# Patient Record
Sex: Female | Born: 1950 | Race: White | Hispanic: No | Marital: Married | Smoking: Former smoker
Health system: Southern US, Community
[De-identification: ages and names within clinical notes are randomized; demographics above are authoritative.]

## PROBLEM LIST (undated history)

## (undated) DIAGNOSIS — I519 Heart disease, unspecified: Secondary | ICD-10-CM

## (undated) DIAGNOSIS — M722 Plantar fascial fibromatosis: Secondary | ICD-10-CM

## (undated) DIAGNOSIS — N814 Uterovaginal prolapse, unspecified: Secondary | ICD-10-CM

## (undated) DIAGNOSIS — M8580 Other specified disorders of bone density and structure, unspecified site: Secondary | ICD-10-CM

## (undated) DIAGNOSIS — I509 Heart failure, unspecified: Secondary | ICD-10-CM

## (undated) DIAGNOSIS — G4700 Insomnia, unspecified: Secondary | ICD-10-CM

## (undated) DIAGNOSIS — R112 Nausea with vomiting, unspecified: Secondary | ICD-10-CM

## (undated) DIAGNOSIS — C801 Malignant (primary) neoplasm, unspecified: Secondary | ICD-10-CM

## (undated) DIAGNOSIS — Z9889 Other specified postprocedural states: Secondary | ICD-10-CM

## (undated) DIAGNOSIS — G4730 Sleep apnea, unspecified: Secondary | ICD-10-CM

## (undated) DIAGNOSIS — I10 Essential (primary) hypertension: Secondary | ICD-10-CM

## (undated) DIAGNOSIS — E785 Hyperlipidemia, unspecified: Secondary | ICD-10-CM

## (undated) DIAGNOSIS — Z902 Acquired absence of lung [part of]: Secondary | ICD-10-CM

## (undated) DIAGNOSIS — D1430 Benign neoplasm of unspecified bronchus and lung: Secondary | ICD-10-CM

## (undated) DIAGNOSIS — Z923 Personal history of irradiation: Secondary | ICD-10-CM

## (undated) DIAGNOSIS — Z860101 Personal history of adenomatous and serrated colon polyps: Secondary | ICD-10-CM

## (undated) DIAGNOSIS — R0600 Dyspnea, unspecified: Secondary | ICD-10-CM

## (undated) DIAGNOSIS — R32 Unspecified urinary incontinence: Secondary | ICD-10-CM

## (undated) DIAGNOSIS — K219 Gastro-esophageal reflux disease without esophagitis: Secondary | ICD-10-CM

## (undated) DIAGNOSIS — C50919 Malignant neoplasm of unspecified site of unspecified female breast: Secondary | ICD-10-CM

## (undated) HISTORY — PX: LUNG SURGERY: SHX703

## (undated) HISTORY — DX: Gastro-esophageal reflux disease without esophagitis: K21.9

## (undated) HISTORY — DX: Hyperlipidemia, unspecified: E78.5

## (undated) HISTORY — DX: Other specified disorders of bone density and structure, unspecified site: M85.80

## (undated) HISTORY — PX: CHOLECYSTECTOMY: SHX55

## (undated) HISTORY — PX: DILATION AND CURETTAGE OF UTERUS: SHX78

## (undated) HISTORY — DX: Plantar fascial fibromatosis: M72.2

## (undated) HISTORY — PX: EYE SURGERY: SHX253

## (undated) HISTORY — DX: Insomnia, unspecified: G47.00

## (undated) HISTORY — PX: CATARACT EXTRACTION, BILATERAL: SHX1313

## (undated) HISTORY — DX: Benign neoplasm of unspecified bronchus and lung: D14.30

## (undated) HISTORY — PX: MASTECTOMY: SHX3

## (undated) HISTORY — PX: LOBECTOMY: SHX5089

## (undated) HISTORY — PX: OTHER SURGICAL HISTORY: SHX169

## (undated) HISTORY — DX: Acquired absence of lung (part of): Z90.2

---

## 2003-04-10 HISTORY — PX: UPPER GI ENDOSCOPY: SHX6162

## 2004-07-03 ENCOUNTER — Ambulatory Visit: Payer: Self-pay | Admitting: Obstetrics and Gynecology

## 2004-09-22 ENCOUNTER — Ambulatory Visit: Payer: Self-pay | Admitting: Unknown Physician Specialty

## 2005-07-05 ENCOUNTER — Ambulatory Visit: Payer: Self-pay | Admitting: Obstetrics and Gynecology

## 2006-06-07 ENCOUNTER — Ambulatory Visit: Payer: Self-pay | Admitting: Otolaryngology

## 2006-07-10 ENCOUNTER — Ambulatory Visit: Payer: Self-pay | Admitting: Obstetrics and Gynecology

## 2007-07-15 ENCOUNTER — Ambulatory Visit: Payer: Self-pay | Admitting: Obstetrics and Gynecology

## 2007-10-09 ENCOUNTER — Ambulatory Visit: Payer: Self-pay

## 2007-10-28 ENCOUNTER — Ambulatory Visit: Payer: Self-pay

## 2008-04-09 HISTORY — PX: FOOT SURGERY: SHX648

## 2008-09-10 ENCOUNTER — Ambulatory Visit: Payer: Self-pay | Admitting: Obstetrics and Gynecology

## 2009-11-03 HISTORY — PX: CYSTECTOMY: SUR359

## 2009-12-05 ENCOUNTER — Ambulatory Visit: Payer: Self-pay | Admitting: Obstetrics and Gynecology

## 2011-01-17 ENCOUNTER — Ambulatory Visit: Payer: Self-pay | Admitting: Obstetrics and Gynecology

## 2011-02-23 ENCOUNTER — Ambulatory Visit: Payer: Self-pay

## 2011-09-28 DIAGNOSIS — I519 Heart disease, unspecified: Secondary | ICD-10-CM | POA: Insufficient documentation

## 2011-11-11 ENCOUNTER — Emergency Department: Payer: Self-pay | Admitting: Emergency Medicine

## 2011-11-11 LAB — BASIC METABOLIC PANEL
Anion Gap: 8 (ref 7–16)
Calcium, Total: 9.5 mg/dL (ref 8.5–10.1)
Co2: 28 mmol/L (ref 21–32)
EGFR (African American): >60
Glucose: 119 mg/dL — ABNORMAL HIGH (ref 65–99)
Osmolality: 283 (ref 275–301)
Potassium: 3.8 mmol/L (ref 3.5–5.1)

## 2011-11-11 LAB — CBC WITH DIFFERENTIAL/PLATELET
Eosinophil %: 1.7 %
HCT: 36.5 % (ref 35.0–47.0)
Lymphocyte #: 0.4 10*3/uL — ABNORMAL LOW (ref 1.0–3.6)
MCH: 29.8 pg (ref 26.0–34.0)
MCV: 87 fL (ref 80–100)
Monocyte #: 0.7 x10 3/mm (ref 0.2–0.9)
Monocyte %: 5.6 %
Neutrophil #: 11.4 10*3/uL — ABNORMAL HIGH (ref 1.4–6.5)
Platelet: 216 10*3/uL (ref 150–440)
RDW: 13.6 % (ref 11.5–14.5)

## 2012-01-22 ENCOUNTER — Ambulatory Visit: Payer: Self-pay | Admitting: Obstetrics and Gynecology

## 2012-03-04 ENCOUNTER — Ambulatory Visit: Payer: Self-pay

## 2013-05-11 ENCOUNTER — Ambulatory Visit: Payer: Self-pay

## 2013-07-18 DIAGNOSIS — G4733 Obstructive sleep apnea (adult) (pediatric): Secondary | ICD-10-CM | POA: Insufficient documentation

## 2013-07-18 DIAGNOSIS — E669 Obesity, unspecified: Secondary | ICD-10-CM | POA: Insufficient documentation

## 2013-07-18 DIAGNOSIS — I10 Essential (primary) hypertension: Secondary | ICD-10-CM | POA: Insufficient documentation

## 2013-07-18 DIAGNOSIS — Z9889 Other specified postprocedural states: Secondary | ICD-10-CM | POA: Insufficient documentation

## 2013-10-19 ENCOUNTER — Ambulatory Visit: Payer: Self-pay | Admitting: Unknown Physician Specialty

## 2013-10-19 LAB — HM COLONOSCOPY

## 2013-10-20 LAB — PATHOLOGY REPORT

## 2013-11-17 DIAGNOSIS — I10 Essential (primary) hypertension: Secondary | ICD-10-CM | POA: Diagnosis not present

## 2013-11-17 DIAGNOSIS — I8310 Varicose veins of unspecified lower extremity with inflammation: Secondary | ICD-10-CM | POA: Diagnosis not present

## 2013-11-17 DIAGNOSIS — I509 Heart failure, unspecified: Secondary | ICD-10-CM | POA: Diagnosis not present

## 2013-12-07 DIAGNOSIS — H524 Presbyopia: Secondary | ICD-10-CM | POA: Diagnosis not present

## 2013-12-07 DIAGNOSIS — H52229 Regular astigmatism, unspecified eye: Secondary | ICD-10-CM | POA: Diagnosis not present

## 2013-12-07 DIAGNOSIS — H02839 Dermatochalasis of unspecified eye, unspecified eyelid: Secondary | ICD-10-CM | POA: Diagnosis not present

## 2013-12-07 DIAGNOSIS — H5200 Hypermetropia, unspecified eye: Secondary | ICD-10-CM | POA: Diagnosis not present

## 2013-12-07 DIAGNOSIS — H2510 Age-related nuclear cataract, unspecified eye: Secondary | ICD-10-CM | POA: Diagnosis not present

## 2013-12-07 DIAGNOSIS — H43819 Vitreous degeneration, unspecified eye: Secondary | ICD-10-CM | POA: Diagnosis not present

## 2013-12-08 ENCOUNTER — Ambulatory Visit: Payer: Self-pay | Admitting: Emergency Medicine

## 2013-12-08 DIAGNOSIS — I10 Essential (primary) hypertension: Secondary | ICD-10-CM | POA: Diagnosis not present

## 2013-12-08 DIAGNOSIS — K219 Gastro-esophageal reflux disease without esophagitis: Secondary | ICD-10-CM | POA: Diagnosis not present

## 2013-12-08 DIAGNOSIS — J069 Acute upper respiratory infection, unspecified: Secondary | ICD-10-CM | POA: Diagnosis not present

## 2013-12-08 DIAGNOSIS — Z9089 Acquired absence of other organs: Secondary | ICD-10-CM | POA: Diagnosis not present

## 2013-12-08 DIAGNOSIS — J40 Bronchitis, not specified as acute or chronic: Secondary | ICD-10-CM | POA: Diagnosis not present

## 2013-12-08 DIAGNOSIS — Z87891 Personal history of nicotine dependence: Secondary | ICD-10-CM | POA: Diagnosis not present

## 2013-12-08 DIAGNOSIS — Z7982 Long term (current) use of aspirin: Secondary | ICD-10-CM | POA: Diagnosis not present

## 2013-12-08 DIAGNOSIS — Z79899 Other long term (current) drug therapy: Secondary | ICD-10-CM | POA: Diagnosis not present

## 2013-12-22 DIAGNOSIS — I6529 Occlusion and stenosis of unspecified carotid artery: Secondary | ICD-10-CM | POA: Diagnosis not present

## 2013-12-22 DIAGNOSIS — M79609 Pain in unspecified limb: Secondary | ICD-10-CM | POA: Diagnosis not present

## 2014-02-18 DIAGNOSIS — Z124 Encounter for screening for malignant neoplasm of cervix: Secondary | ICD-10-CM | POA: Diagnosis not present

## 2014-02-18 DIAGNOSIS — Z01419 Encounter for gynecological examination (general) (routine) without abnormal findings: Secondary | ICD-10-CM | POA: Diagnosis not present

## 2014-02-22 DIAGNOSIS — M810 Age-related osteoporosis without current pathological fracture: Secondary | ICD-10-CM | POA: Diagnosis not present

## 2014-02-22 DIAGNOSIS — M899 Disorder of bone, unspecified: Secondary | ICD-10-CM | POA: Diagnosis not present

## 2014-03-08 DIAGNOSIS — M8588 Other specified disorders of bone density and structure, other site: Secondary | ICD-10-CM | POA: Insufficient documentation

## 2014-03-08 DIAGNOSIS — M8580 Other specified disorders of bone density and structure, unspecified site: Secondary | ICD-10-CM | POA: Insufficient documentation

## 2014-03-22 ENCOUNTER — Ambulatory Visit: Payer: Self-pay | Admitting: Obstetrics and Gynecology

## 2014-03-22 DIAGNOSIS — Z1231 Encounter for screening mammogram for malignant neoplasm of breast: Secondary | ICD-10-CM | POA: Diagnosis not present

## 2014-03-22 LAB — HM MAMMOGRAPHY

## 2014-03-23 DIAGNOSIS — I519 Heart disease, unspecified: Secondary | ICD-10-CM | POA: Diagnosis not present

## 2014-03-23 DIAGNOSIS — I10 Essential (primary) hypertension: Secondary | ICD-10-CM | POA: Diagnosis not present

## 2014-05-19 DIAGNOSIS — Z0000 Encounter for general adult medical examination without abnormal findings: Secondary | ICD-10-CM | POA: Diagnosis not present

## 2014-05-19 DIAGNOSIS — K219 Gastro-esophageal reflux disease without esophagitis: Secondary | ICD-10-CM | POA: Diagnosis not present

## 2014-05-19 DIAGNOSIS — I10 Essential (primary) hypertension: Secondary | ICD-10-CM | POA: Diagnosis not present

## 2014-05-19 DIAGNOSIS — E669 Obesity, unspecified: Secondary | ICD-10-CM | POA: Diagnosis not present

## 2014-05-21 ENCOUNTER — Ambulatory Visit: Payer: Self-pay

## 2014-05-21 DIAGNOSIS — R918 Other nonspecific abnormal finding of lung field: Secondary | ICD-10-CM | POA: Diagnosis not present

## 2014-06-10 DIAGNOSIS — H16141 Punctate keratitis, right eye: Secondary | ICD-10-CM | POA: Diagnosis not present

## 2014-06-10 DIAGNOSIS — H02831 Dermatochalasis of right upper eyelid: Secondary | ICD-10-CM | POA: Diagnosis not present

## 2014-06-10 DIAGNOSIS — H02834 Dermatochalasis of left upper eyelid: Secondary | ICD-10-CM | POA: Diagnosis not present

## 2014-06-10 DIAGNOSIS — H43813 Vitreous degeneration, bilateral: Secondary | ICD-10-CM | POA: Diagnosis not present

## 2014-06-10 DIAGNOSIS — H2513 Age-related nuclear cataract, bilateral: Secondary | ICD-10-CM | POA: Diagnosis not present

## 2014-08-06 ENCOUNTER — Ambulatory Visit
Admit: 2014-08-06 | Disposition: A | Discharge status: Home / Self Care | Payer: Self-pay | Attending: Family Medicine | Admitting: Family Medicine

## 2014-08-06 DIAGNOSIS — J329 Chronic sinusitis, unspecified: Secondary | ICD-10-CM | POA: Diagnosis not present

## 2014-08-06 LAB — RAPID STREP-A WITH REFLX: Micro Text Report: NEGATIVE

## 2014-08-09 LAB — BETA STREP CULTURE(ARMC)

## 2014-09-22 DIAGNOSIS — R42 Dizziness and giddiness: Secondary | ICD-10-CM | POA: Diagnosis not present

## 2014-09-22 DIAGNOSIS — I10 Essential (primary) hypertension: Secondary | ICD-10-CM | POA: Diagnosis not present

## 2014-09-22 DIAGNOSIS — I519 Heart disease, unspecified: Secondary | ICD-10-CM | POA: Diagnosis not present

## 2014-09-22 DIAGNOSIS — Z86010 Personal history of colonic polyps: Secondary | ICD-10-CM | POA: Diagnosis not present

## 2014-10-12 ENCOUNTER — Ambulatory Visit
Admit: 2014-10-12 | Discharge: 2014-10-12 | Disposition: A | Discharge status: Home / Self Care | Payer: Medicare Other | Attending: Family Medicine | Admitting: Family Medicine

## 2014-10-12 ENCOUNTER — Ambulatory Visit: Payer: Medicare Other

## 2014-10-12 ENCOUNTER — Encounter: Payer: Self-pay | Admitting: Emergency Medicine

## 2014-10-12 ENCOUNTER — Ambulatory Visit
Admission: EM | Admit: 2014-10-12 | Discharge: 2014-10-12 | Disposition: A | Discharge status: Home / Self Care | Payer: Medicare Other | Attending: Family Medicine | Admitting: Family Medicine

## 2014-10-12 DIAGNOSIS — M79662 Pain in left lower leg: Secondary | ICD-10-CM | POA: Diagnosis not present

## 2014-10-12 DIAGNOSIS — M79605 Pain in left leg: Secondary | ICD-10-CM | POA: Diagnosis not present

## 2014-10-12 DIAGNOSIS — I509 Heart failure, unspecified: Secondary | ICD-10-CM | POA: Insufficient documentation

## 2014-10-12 DIAGNOSIS — M7989 Other specified soft tissue disorders: Secondary | ICD-10-CM | POA: Diagnosis not present

## 2014-10-12 DIAGNOSIS — M62831 Muscle spasm of calf: Secondary | ICD-10-CM | POA: Diagnosis present

## 2014-10-12 DIAGNOSIS — Z7982 Long term (current) use of aspirin: Secondary | ICD-10-CM | POA: Insufficient documentation

## 2014-10-12 DIAGNOSIS — I10 Essential (primary) hypertension: Secondary | ICD-10-CM | POA: Insufficient documentation

## 2014-10-12 HISTORY — DX: Heart failure, unspecified: I50.9

## 2014-10-12 HISTORY — DX: Essential (primary) hypertension: I10

## 2014-10-12 NOTE — ED Notes (Signed)
Patient c/o pain and muscle cramps in her left calf since Friday.  Patient denies injury.

## 2014-10-12 NOTE — ED Provider Notes (Signed)
CSN: [id number]     Arrival date & time 10/12/14  1018 History   First MD Initiated Contact with Patient 10/12/14 1231     Chief Complaint  Patient presents with  . Spasms    left calf   (Consider location/radiation/quality/duration/timing/severity/associated sxs/prior Treatment) HPI Comments: 64 yo female with a  4 days h/o sudden onset of calf pain and intermittent swelling of calf and foot.  Patient denies any injuries, falls, trauma, redness, rash. States she thought she was having muscle cramps but pain has been consistent and not improving. Denies any chest pains or shortness of breath. Has not tried any anything for relief.   The history is provided by the patient.    Past Medical History  Diagnosis Date  . Hypertension   . CHF (congestive heart failure)    Past Surgical History  Procedure Laterality Date  . Abdominal hysterectomy    . Cholecystectomy    . Lung surgery Right    History reviewed. No pertinent family history. History  Substance Use Topics  . Smoking status: Never Smoker   . Smokeless tobacco: Never Used  . Alcohol Use: No   OB History    No data available     Review of Systems  Allergies  Contrast media and Hctz  Home Medications   Prior to Admission medications   Medication Sig Start Date End Date Taking? Authorizing Provider  amLODipine (NORVASC) 5 MG tablet Take 5 mg by mouth daily.   Yes Historical Provider, MD  aspirin 81 MG tablet Take 81 mg by mouth daily.   Yes Historical Provider, MD  calcium-vitamin D (OSCAL WITH D) 500-200 MG-UNIT per tablet Take 1 tablet by mouth 2 (two) times daily.   Yes Historical Provider, MD  metoprolol succinate (TOPROL-XL) 25 MG 24 hr tablet Take 25 mg by mouth daily.   Yes Historical Provider, MD  Omega 3-6-9 Fatty Acids (OMEGA-3 & OMEGA-6 FISH OIL PO) Take 1 capsule by mouth 2 (two) times daily.   Yes Historical Provider, MD  pantoprazole (PROTONIX) 40 MG tablet Take 40 mg by mouth 2 (two) times daily.   Yes  Historical Provider, MD  [facility] Wort 1000 MG CAPS Take 1 capsule by mouth 2 (two) times daily.   Yes Historical Provider, MD  valsartan (DIOVAN) 160 MG tablet Take 160 mg by mouth daily.   Yes Historical Provider, MD  vitamin B-12 (CYANOCOBALAMIN) 1000 MCG tablet Take 1,000 mcg by mouth daily.   Yes Historical Provider, MD   BP 131/70 mmHg  Pulse 60  Temp(Src) 96.5 F (35.8 C) (Tympanic)  Resp 16  Ht 5\' 7"  (1.702 m)  Wt 210 lb (95.255 kg)  BMI 32.88 kg/m2  SpO2 99% Physical Exam  Constitutional: She appears well-developed and well-nourished. No distress.  Cardiovascular: Normal rate, regular rhythm and normal heart sounds.   Pulmonary/Chest: Effort normal and breath sounds normal. No respiratory distress.  Musculoskeletal:  Mild left calf edema and tenderness to palpation; skin intact; no erythema; extremity neurovascularly intact  Skin: No rash noted. She is not diaphoretic. No erythema.  Nursing note and vitals reviewed.   ED Course  Procedures (including critical care time) Labs Review Labs Reviewed - No data to display  Imaging Review No results found.   MDM   1. Calf pain, left    Plan: 1. Possible etiologies reviewed with patient (muscular strain vs possible DVT) 2. Recommend lower extremity venous doppler to rule out DVT; will be done today; further recommendations pending  lower extremity doppler US   Oleda Borski, MD 10/12/14 1400

## 2014-12-28 DIAGNOSIS — I6523 Occlusion and stenosis of bilateral carotid arteries: Secondary | ICD-10-CM | POA: Diagnosis not present

## 2014-12-28 DIAGNOSIS — E785 Hyperlipidemia, unspecified: Secondary | ICD-10-CM | POA: Diagnosis not present

## 2014-12-28 DIAGNOSIS — I6529 Occlusion and stenosis of unspecified carotid artery: Secondary | ICD-10-CM | POA: Diagnosis not present

## 2014-12-28 DIAGNOSIS — I10 Essential (primary) hypertension: Secondary | ICD-10-CM | POA: Diagnosis not present

## 2014-12-28 DIAGNOSIS — I8310 Varicose veins of unspecified lower extremity with inflammation: Secondary | ICD-10-CM | POA: Diagnosis not present

## 2015-01-19 DIAGNOSIS — M722 Plantar fascial fibromatosis: Secondary | ICD-10-CM

## 2015-01-19 DIAGNOSIS — Z86010 Personal history of colonic polyps: Secondary | ICD-10-CM | POA: Insufficient documentation

## 2015-01-19 DIAGNOSIS — E785 Hyperlipidemia, unspecified: Secondary | ICD-10-CM | POA: Insufficient documentation

## 2015-01-19 DIAGNOSIS — G4700 Insomnia, unspecified: Secondary | ICD-10-CM | POA: Insufficient documentation

## 2015-01-19 DIAGNOSIS — E8881 Metabolic syndrome: Secondary | ICD-10-CM | POA: Insufficient documentation

## 2015-01-19 DIAGNOSIS — E669 Obesity, unspecified: Secondary | ICD-10-CM | POA: Insufficient documentation

## 2015-01-19 DIAGNOSIS — K219 Gastro-esophageal reflux disease without esophagitis: Secondary | ICD-10-CM | POA: Insufficient documentation

## 2015-01-19 DIAGNOSIS — Z860101 Personal history of adenomatous and serrated colon polyps: Secondary | ICD-10-CM | POA: Insufficient documentation

## 2015-01-19 DIAGNOSIS — D1430 Benign neoplasm of unspecified bronchus and lung: Secondary | ICD-10-CM | POA: Insufficient documentation

## 2015-01-21 ENCOUNTER — Ambulatory Visit (INDEPENDENT_AMBULATORY_CARE_PROVIDER_SITE_OTHER): Payer: Medicare Other | Admitting: Unknown Physician Specialty

## 2015-01-21 ENCOUNTER — Encounter: Payer: Self-pay | Admitting: Unknown Physician Specialty

## 2015-01-21 VITALS — BP 133/85 | HR 74 | Temp 98.2°F | Ht 66.5 in | Wt 216.8 lb

## 2015-01-21 DIAGNOSIS — Z23 Encounter for immunization: Secondary | ICD-10-CM | POA: Diagnosis not present

## 2015-01-21 DIAGNOSIS — I10 Essential (primary) hypertension: Secondary | ICD-10-CM | POA: Diagnosis not present

## 2015-01-21 DIAGNOSIS — K219 Gastro-esophageal reflux disease without esophagitis: Secondary | ICD-10-CM | POA: Diagnosis not present

## 2015-01-21 DIAGNOSIS — E785 Hyperlipidemia, unspecified: Secondary | ICD-10-CM | POA: Diagnosis not present

## 2015-01-21 MED ORDER — AMLODIPINE BESYLATE 5 MG PO TABS: 5.0000 mg | ORAL_TABLET | Freq: Every day | ORAL | Status: DC

## 2015-01-21 MED ORDER — VALSARTAN 160 MG PO TABS: 160.0000 mg | ORAL_TABLET | Freq: Every day | ORAL | Status: DC

## 2015-01-21 MED ORDER — METOPROLOL SUCCINATE ER 25 MG PO TB24: 25.0000 mg | ORAL_TABLET | Freq: Every day | ORAL | Status: DC

## 2015-01-21 MED ORDER — PANTOPRAZOLE SODIUM 40 MG PO TBEC: 40.0000 mg | DELAYED_RELEASE_TABLET | Freq: Two times a day (BID) | ORAL | Status: DC

## 2015-01-21 NOTE — Assessment & Plan Note (Signed)
Stable, continue present medications.   

## 2015-01-21 NOTE — Assessment & Plan Note (Signed)
Refusing statin

## 2015-01-21 NOTE — Progress Notes (Signed)
BP 133/85 mmHg  Pulse 74  Temp(Src) 98.2 F (36.8 C)  Ht 5' 6.5" (1.689 m)  Wt 216 lb 12.8 oz (98.34 kg)  BMI 34.47 kg/m2  SpO2 97%   Subjective:    Patient ID: Melissa Merritt, female    DOB: 1950/12/21, 64 y.o.   MRN: [id number]  HPI: Melissa Merritt is a 64 y.o. female  Chief Complaint  Patient presents with  . Medication Management    Needs refills on medications.    Hypertension This is a chronic problem. The problem is controlled. Pertinent negatives include no anxiety, blurred vision, chest pain, headaches, malaise/fatigue, neck pain, orthopnea, palpitations, peripheral edema, PND, shortness of breath or sweats. The current treatment provides significant improvement. There are no compliance problems.   Gastrophageal Reflux She reports no abdominal pain or no chest pain. This is a chronic problem. Episode frequency: constantly if runs out. The problem has been unchanged. The treatment provided significant relief.     Relevant past medical, surgical, family and social history reviewed and updated as indicated. Interim medical history since our last visit reviewed. Allergies and medications reviewed and updated.  Review of Systems  Constitutional: Negative for malaise/fatigue.  Eyes: Negative for blurred vision.  Respiratory: Negative for shortness of breath.   Cardiovascular: Negative for chest pain, palpitations, orthopnea and PND.  Gastrointestinal: Negative for abdominal pain.  Musculoskeletal: Negative for neck pain.  Neurological: Negative for headaches.    Per HPI unless specifically indicated above     Objective:    BP 133/85 mmHg  Pulse 74  Temp(Src) 98.2 F (36.8 C)  Ht 5' 6.5" (1.689 m)  Wt 216 lb 12.8 oz (98.34 kg)  BMI 34.47 kg/m2  SpO2 97%  Wt Readings from Last 3 Encounters:  01/21/15 216 lb 12.8 oz (98.34 kg)  05/19/14 216 lb (97.977 kg)  10/12/14 210 lb (95.255 kg)    Physical Exam  Constitutional: She is oriented to  person, place, and time. She appears well-developed and well-nourished. No distress.  HENT:  Head: Normocephalic and atraumatic.  Eyes: Conjunctivae and lids are normal. Right eye exhibits no discharge. Left eye exhibits no discharge. No scleral icterus.  Neck: Normal range of motion. Neck supple. No JVD present.  Cardiovascular: Normal rate and regular rhythm.   Pulmonary/Chest: Effort normal and breath sounds normal. No respiratory distress.  Abdominal: Normal appearance. There is no splenomegaly or hepatomegaly.  Musculoskeletal: Normal range of motion.  Neurological: She is alert and oriented to person, place, and time.  Skin: Skin is intact. No rash noted. No pallor.  Psychiatric: She has a normal mood and affect. Her behavior is normal. Judgment and thought content normal.      Assessment & Plan:   Problem List Items Addressed This Visit      Unprioritized   BP (high blood pressure)    Stable, continue present medications.       Relevant Medications   amLODipine (NORVASC) 5 MG tablet   metoprolol succinate (TOPROL-XL) 25 MG 24 hr tablet   valsartan (DIOVAN) 160 MG tablet   GERD (gastroesophageal reflux disease)    Stable, continue present medications.       Relevant Medications   pantoprazole (PROTONIX) 40 MG tablet   Hyperlipidemia    Refusing statin      Relevant Medications   amLODipine (NORVASC) 5 MG tablet   metoprolol succinate (TOPROL-XL) 25 MG 24 hr tablet   valsartan (DIOVAN) 160 MG tablet    Other Visit Diagnoses  Need for influenza vaccination    -  Primary    Relevant Orders    Flu Vaccine QUAD 36+ mos PF IM (Fluarix & Fluzone Quad PF) (Completed)        Follow up plan: Return for physical in February.

## 2015-02-01 DIAGNOSIS — H43813 Vitreous degeneration, bilateral: Secondary | ICD-10-CM | POA: Diagnosis not present

## 2015-02-01 DIAGNOSIS — H02831 Dermatochalasis of right upper eyelid: Secondary | ICD-10-CM | POA: Diagnosis not present

## 2015-02-01 DIAGNOSIS — H2513 Age-related nuclear cataract, bilateral: Secondary | ICD-10-CM | POA: Diagnosis not present

## 2015-02-01 DIAGNOSIS — H5203 Hypermetropia, bilateral: Secondary | ICD-10-CM | POA: Diagnosis not present

## 2015-02-01 DIAGNOSIS — H52223 Regular astigmatism, bilateral: Secondary | ICD-10-CM | POA: Diagnosis not present

## 2015-02-01 DIAGNOSIS — H524 Presbyopia: Secondary | ICD-10-CM | POA: Diagnosis not present

## 2015-02-01 DIAGNOSIS — H02834 Dermatochalasis of left upper eyelid: Secondary | ICD-10-CM | POA: Diagnosis not present

## 2015-02-22 ENCOUNTER — Other Ambulatory Visit: Payer: Self-pay | Admitting: Obstetrics and Gynecology

## 2015-02-22 DIAGNOSIS — Z1231 Encounter for screening mammogram for malignant neoplasm of breast: Secondary | ICD-10-CM

## 2015-02-22 DIAGNOSIS — Z01411 Encounter for gynecological examination (general) (routine) with abnormal findings: Secondary | ICD-10-CM | POA: Diagnosis not present

## 2015-03-24 DIAGNOSIS — I519 Heart disease, unspecified: Secondary | ICD-10-CM | POA: Diagnosis not present

## 2015-03-24 DIAGNOSIS — I10 Essential (primary) hypertension: Secondary | ICD-10-CM | POA: Diagnosis not present

## 2015-03-24 DIAGNOSIS — G4733 Obstructive sleep apnea (adult) (pediatric): Secondary | ICD-10-CM | POA: Diagnosis not present

## 2015-04-05 ENCOUNTER — Other Ambulatory Visit: Payer: Self-pay | Admitting: Obstetrics and Gynecology

## 2015-04-05 ENCOUNTER — Ambulatory Visit
Admission: RE | Admit: 2015-04-05 | Discharge: 2015-04-05 | Disposition: A | Discharge status: Home / Self Care | Payer: Medicare Other | Source: Ambulatory Visit | Attending: Obstetrics and Gynecology | Admitting: Obstetrics and Gynecology

## 2015-04-05 DIAGNOSIS — Z1231 Encounter for screening mammogram for malignant neoplasm of breast: Secondary | ICD-10-CM | POA: Diagnosis not present

## 2015-05-23 ENCOUNTER — Encounter: Payer: Self-pay | Admitting: Unknown Physician Specialty

## 2015-05-23 ENCOUNTER — Ambulatory Visit (INDEPENDENT_AMBULATORY_CARE_PROVIDER_SITE_OTHER): Payer: Medicare Other | Admitting: Unknown Physician Specialty

## 2015-05-23 VITALS — BP 144/85 | HR 78 | Temp 98.4°F | Ht 67.0 in | Wt 213.2 lb

## 2015-05-23 DIAGNOSIS — I10 Essential (primary) hypertension: Secondary | ICD-10-CM | POA: Diagnosis not present

## 2015-05-23 DIAGNOSIS — Z0000 Encounter for general adult medical examination without abnormal findings: Secondary | ICD-10-CM | POA: Diagnosis not present

## 2015-05-23 DIAGNOSIS — K219 Gastro-esophageal reflux disease without esophagitis: Secondary | ICD-10-CM | POA: Diagnosis not present

## 2015-05-23 DIAGNOSIS — D1430 Benign neoplasm of unspecified bronchus and lung: Secondary | ICD-10-CM | POA: Diagnosis not present

## 2015-05-23 DIAGNOSIS — E669 Obesity, unspecified: Secondary | ICD-10-CM

## 2015-05-23 DIAGNOSIS — Z23 Encounter for immunization: Secondary | ICD-10-CM

## 2015-05-23 LAB — MICROALBUMIN, URINE WAIVED
Creatinine, Urine Waived: 300 mg/dL (ref 10–300)
MICROALB, UR WAIVED: 80 mg/L — AB (ref 0–19)

## 2015-05-23 NOTE — Assessment & Plan Note (Addendum)
Stable, continue present medications.   

## 2015-05-23 NOTE — Assessment & Plan Note (Signed)
Stable, continue present medications.   

## 2015-05-23 NOTE — Assessment & Plan Note (Signed)
Trying to walk.  Stopped drinking as many sodas.  Trying to drink water.

## 2015-05-23 NOTE — Assessment & Plan Note (Signed)
Chest x-ray.

## 2015-05-23 NOTE — Progress Notes (Signed)
n  BP 144/85 mmHg  Pulse 78  Temp(Src) 98.4 F (36.9 C)  Ht 5\' 7"  (1.702 m)  Wt 213 lb 3.2 oz (96.707 kg)  BMI 33.38 kg/m2  SpO2 93%   Subjective:    Patient ID: Melissa Merritt, female    DOB: Aug 29, 1950, 65 y.o.   MRN: [id number]  HPI: Melissa Merritt is a 65 y.o. female  Chief Complaint  Patient presents with  . Medicare Wellness   Functional Status Survey: Is the patient deaf or have difficulty hearing?: No Does the patient have difficulty seeing, even when wearing glasses/contacts?: Yes (pt states she has a cataract in left eye) Does the patient have difficulty concentrating, remembering, or making decisions?: No Does the patient have difficulty walking or climbing stairs?: Yes (pt states has trouble climibing stairs) Does the patient have difficulty dressing or bathing?: No Does the patient have difficulty doing errands alone such as visiting a doctor's office or shopping?: No Fall Risk  05/23/2015  Falls in the past year? Yes  Number falls in past yr: 1  Injury with Fall? No   Depression screen PHQ 2/9 05/23/2015  Decreased Interest 0  Down, Depressed, Hopeless 0  PHQ - 2 Score 0    Hypertension Using medications without difficulty Average home BPs 127/74   No problems or lightheadedness No chest pain with exertion or shortness of breath No Edema  GERD Controlled with Protonix  Pt is able to perform complex mental tasks, recognize clock face, recognize time and do a 3 item recall.   Advance directives updated    Relevant past medical, surgical, family and social history reviewed and updated as indicated. Interim medical history since our last visit reviewed. Allergies and medications reviewed and updated.  Review of Systems  Constitutional: Negative.   HENT: Negative.   Eyes: Negative.   Respiratory: Negative.   Cardiovascular: Negative.   Gastrointestinal: Negative.   Endocrine: Negative.   Genitourinary: Negative.    Musculoskeletal: Negative.   Skin: Negative.   Allergic/Immunologic: Negative.   Neurological: Negative.   Hematological: Negative.   Psychiatric/Behavioral: Negative.     Per HPI unless specifically indicated above     Objective:    BP 144/85 mmHg  Pulse 78  Temp(Src) 98.4 F (36.9 C)  Ht 5\' 7"  (1.702 m)  Wt 213 lb 3.2 oz (96.707 kg)  BMI 33.38 kg/m2  SpO2 93%  Wt Readings from Last 3 Encounters:  05/23/15 213 lb 3.2 oz (96.707 kg)  01/21/15 216 lb 12.8 oz (98.34 kg)  05/19/14 216 lb (97.977 kg)    Physical Exam  Constitutional: She is oriented to person, place, and time. She appears well-developed and well-nourished.  HENT:  Head: Normocephalic and atraumatic.  Eyes: Pupils are equal, round, and reactive to light. Right eye exhibits no discharge. Left eye exhibits no discharge. No scleral icterus.  Neck: Normal range of motion. Neck supple. Carotid bruit is not present. No thyromegaly present.  Cardiovascular: Normal rate, regular rhythm and normal heart sounds.  Exam reveals no gallop and no friction rub.   No murmur heard. Pulmonary/Chest: Effort normal and breath sounds normal. No respiratory distress. She has no wheezes. She has no rales.  Abdominal: Soft. Bowel sounds are normal. There is no tenderness. There is no rebound.  Genitourinary: No breast swelling, tenderness or discharge.  Musculoskeletal: Normal range of motion.  Lymphadenopathy:    She has no cervical adenopathy.  Neurological: She is alert and oriented to person, place, and time.  Skin: Skin is warm, dry and intact. No rash noted.  Psychiatric: She has a normal mood and affect. Her speech is normal and behavior is normal. Judgment and thought content normal. Cognition and memory are normal.      Assessment & Plan:   Problem List Items Addressed This Visit      Unprioritized   BP (high blood pressure)    Stable, continue present medications.        Relevant Orders   Microalbumin, Urine  Waived   Comprehensive metabolic panel   Lipid Panel w/o Chol/HDL Ratio   Obesity    Trying to walk.  Stopped drinking as many sodas.  Trying to drink water.         Other Visit Diagnoses    Need for pneumococcal vaccination    -  Primary    Relevant Orders    Pneumococcal polysaccharide vaccine 23-valent greater than or equal to 2yo subcutaneous/IM (Completed)    Annual physical exam            Follow up plan: Return in about 6 months (around 11/20/2015).

## 2015-05-24 ENCOUNTER — Telehealth: Payer: Self-pay | Admitting: Unknown Physician Specialty

## 2015-05-24 DIAGNOSIS — I10 Essential (primary) hypertension: Secondary | ICD-10-CM

## 2015-05-24 DIAGNOSIS — R7301 Impaired fasting glucose: Secondary | ICD-10-CM

## 2015-05-24 DIAGNOSIS — E7800 Pure hypercholesterolemia, unspecified: Secondary | ICD-10-CM

## 2015-05-24 LAB — LIPID PANEL W/O CHOL/HDL RATIO
Cholesterol, Total: 231 mg/dL — ABNORMAL HIGH (ref 100–199)
HDL: 61 mg/dL
LDL Calculated: 145 mg/dL — ABNORMAL HIGH (ref 0–99)
Triglycerides: 125 mg/dL (ref 0–149)
VLDL Cholesterol Cal: 25 mg/dL (ref 5–40)

## 2015-05-24 LAB — COMPREHENSIVE METABOLIC PANEL WITH GFR
ALT: 16 IU/L (ref 0–32)
AST: 19 IU/L (ref 0–40)
Albumin/Globulin Ratio: 1.9 (ref 1.1–2.5)
Albumin: 4.3 g/dL (ref 3.6–4.8)
Alkaline Phosphatase: 93 IU/L (ref 39–117)
BUN/Creatinine Ratio: 24 (ref 11–26)
BUN: 18 mg/dL (ref 8–27)
Bilirubin Total: 0.2 mg/dL (ref 0.0–1.2)
CO2: 23 mmol/L (ref 18–29)
Calcium: 9.6 mg/dL (ref 8.7–10.3)
Chloride: 104 mmol/L (ref 96–106)
Creatinine, Ser: 0.74 mg/dL (ref 0.57–1.00)
GFR calc Af Amer: 98 mL/min/1.73
GFR calc non Af Amer: 85 mL/min/1.73
Globulin, Total: 2.3 g/dL (ref 1.5–4.5)
Glucose: 112 mg/dL — ABNORMAL HIGH (ref 65–99)
Potassium: 4.3 mmol/L (ref 3.5–5.2)
Sodium: 141 mmol/L (ref 134–144)
Total Protein: 6.6 g/dL (ref 6.0–8.5)

## 2015-05-24 NOTE — Telephone Encounter (Signed)
Discussed with patient labs.  Discussed high cholesterol and statins according to the ASCVD calculator and high FBG.  She is refusing statins.  Will recheck fasting lipids and a Hgb A1C in 6 months.

## 2015-05-25 ENCOUNTER — Ambulatory Visit
Admission: RE | Admit: 2015-05-25 | Discharge: 2015-05-25 | Disposition: A | Discharge status: Home / Self Care | Payer: Medicare Other | Source: Ambulatory Visit | Attending: Unknown Physician Specialty | Admitting: Unknown Physician Specialty

## 2015-05-25 DIAGNOSIS — Z86018 Personal history of other benign neoplasm: Secondary | ICD-10-CM | POA: Diagnosis not present

## 2015-05-25 DIAGNOSIS — R05 Cough: Secondary | ICD-10-CM | POA: Diagnosis not present

## 2015-05-25 DIAGNOSIS — D1430 Benign neoplasm of unspecified bronchus and lung: Secondary | ICD-10-CM

## 2015-07-13 ENCOUNTER — Other Ambulatory Visit: Payer: Self-pay | Admitting: Unknown Physician Specialty

## 2015-07-13 NOTE — Telephone Encounter (Signed)
Your patient 

## 2015-08-02 DIAGNOSIS — H43813 Vitreous degeneration, bilateral: Secondary | ICD-10-CM | POA: Diagnosis not present

## 2015-08-02 DIAGNOSIS — H02831 Dermatochalasis of right upper eyelid: Secondary | ICD-10-CM | POA: Diagnosis not present

## 2015-08-02 DIAGNOSIS — I10 Essential (primary) hypertension: Secondary | ICD-10-CM | POA: Diagnosis not present

## 2015-08-02 DIAGNOSIS — H52223 Regular astigmatism, bilateral: Secondary | ICD-10-CM | POA: Diagnosis not present

## 2015-08-02 DIAGNOSIS — H02834 Dermatochalasis of left upper eyelid: Secondary | ICD-10-CM | POA: Diagnosis not present

## 2015-08-02 DIAGNOSIS — H5203 Hypermetropia, bilateral: Secondary | ICD-10-CM | POA: Diagnosis not present

## 2015-08-02 DIAGNOSIS — H2513 Age-related nuclear cataract, bilateral: Secondary | ICD-10-CM | POA: Diagnosis not present

## 2015-08-22 ENCOUNTER — Encounter: Payer: Self-pay | Admitting: Family Medicine

## 2015-08-22 ENCOUNTER — Ambulatory Visit: Payer: Medicare Other | Admitting: Unknown Physician Specialty

## 2015-08-22 ENCOUNTER — Ambulatory Visit (INDEPENDENT_AMBULATORY_CARE_PROVIDER_SITE_OTHER): Payer: Medicare Other | Admitting: Family Medicine

## 2015-08-22 VITALS — BP 131/77 | HR 88 | Temp 99.8°F | Wt 213.0 lb

## 2015-08-22 DIAGNOSIS — J019 Acute sinusitis, unspecified: Secondary | ICD-10-CM

## 2015-08-22 MED ORDER — AZITHROMYCIN 250 MG PO TABS: ORAL_TABLET | ORAL | Status: DC

## 2015-08-22 NOTE — Progress Notes (Signed)
BP 131/77 mmHg  Pulse 88  Temp(Src) 99.8 F (37.7 C)  Wt 213 lb (96.616 kg)  SpO2 94%   Subjective:    Patient ID: Melissa Merritt, female    DOB: 10/09/1950, 65 y.o.   MRN: [id number]  HPI: Melissa Merritt is a 65 y.o. female  Chief Complaint  Patient presents with  . URI    Sick since Saturday. low grade fever, headache, coughing up yellow and green sputum, chest congestion. Did have a sore throat on Saturday but that has went away.  Patient as above feeling bad aching all over headache and other marked systemic symptoms Coughing and headache and congestion very prominent cough with productive sputum. Has tried some over-the-counter medications  Relevant past medical, surgical, family and social history reviewed and updated as indicated. Interim medical history since our last visit reviewed. Allergies and medications reviewed and updated.  Review of Systems  Constitutional: Positive for fever, chills and fatigue.  HENT: Positive for congestion, sinus pressure, sneezing and sore throat.   Respiratory: Positive for cough.   Cardiovascular: Negative for chest pain.    Per HPI unless specifically indicated above     Objective:    BP 131/77 mmHg  Pulse 88  Temp(Src) 99.8 F (37.7 C)  Wt 213 lb (96.616 kg)  SpO2 94%  Wt Readings from Last 3 Encounters:  08/22/15 213 lb (96.616 kg)  05/23/15 213 lb 3.2 oz (96.707 kg)  01/21/15 216 lb 12.8 oz (98.34 kg)    Physical Exam  Constitutional: She is oriented to person, place, and time. She appears well-developed and well-nourished. No distress.  HENT:  Head: Normocephalic and atraumatic.  Right Ear: Hearing and external ear normal.  Left Ear: Hearing and external ear normal.  Nose: Nose normal.  Mouth/Throat: Oropharyngeal exudate present.  Eyes: Conjunctivae and lids are normal. Right eye exhibits no discharge. Left eye exhibits no discharge. No scleral icterus.  Cardiovascular: Normal rate, regular  rhythm and normal heart sounds.   Pulmonary/Chest: Effort normal and breath sounds normal. No respiratory distress.  Musculoskeletal: Normal range of motion.  Neurological: She is alert and oriented to person, place, and time.  Skin: Skin is intact. No rash noted.  Psychiatric: She has a normal mood and affect. Her speech is normal and behavior is normal. Judgment and thought content normal. Cognition and memory are normal.    Results for orders placed or performed in visit on 05/23/15  Microalbumin, Urine Waived  Result Value Ref Range   Microalb, Ur Waived 80 (H) 0 - 19 mg/L   Creatinine, Urine Waived 300 10 - 300 mg/dL   Microalb/Creat Ratio <30 <30 mg/g  Comprehensive metabolic panel  Result Value Ref Range   Glucose 112 (H) 65 - 99 mg/dL   BUN 18 8 - 27 mg/dL   Creatinine, Ser 0.74 0.57 - 1.00 mg/dL   GFR calc non Af Amer 85 >59 mL/min/1.73   GFR calc Af Amer 98 >59 mL/min/1.73   BUN/Creatinine Ratio 24 11 - 26   Sodium 141 134 - 144 mmol/L   Potassium 4.3 3.5 - 5.2 mmol/L   Chloride 104 96 - 106 mmol/L   CO2 23 18 - 29 mmol/L   Calcium 9.6 8.7 - 10.3 mg/dL   Total Protein 6.6 6.0 - 8.5 g/dL   Albumin 4.3 3.6 - 4.8 g/dL   Globulin, Total 2.3 1.5 - 4.5 g/dL   Albumin/Globulin Ratio 1.9 1.1 - 2.5   Bilirubin Total 0.2 0.0 -  1.2 mg/dL   Alkaline Phosphatase 93 39 - 117 IU/L   AST 19 0 - 40 IU/L   ALT 16 0 - 32 IU/L  Lipid Panel w/o Chol/HDL Ratio  Result Value Ref Range   Cholesterol, Total 231 (H) 100 - 199 mg/dL   Triglycerides 125 0 - 149 mg/dL   HDL 61 >39 mg/dL   VLDL Cholesterol Cal 25 5 - 40 mg/dL   LDL Calculated 145 (H) 0 - 99 mg/dL      Assessment & Plan:   Problem List Items Addressed This Visit    None    Visit Diagnoses    Acute sinusitis, recurrence not specified, unspecified location    -  Primary    Discussed care and treatment of bronchitis sinusitis deferred use of antibiotics gave prescription to hold not taking less getting worse and use of OTC  meds.    Relevant Medications    azithromycin (ZITHROMAX) 250 MG tablet        Follow up plan: Return for As scheduled.

## 2015-08-23 ENCOUNTER — Ambulatory Visit: Payer: Medicare Other | Admitting: Unknown Physician Specialty

## 2015-09-30 DIAGNOSIS — H2513 Age-related nuclear cataract, bilateral: Secondary | ICD-10-CM | POA: Diagnosis not present

## 2015-09-30 DIAGNOSIS — H18413 Arcus senilis, bilateral: Secondary | ICD-10-CM | POA: Diagnosis not present

## 2015-09-30 DIAGNOSIS — H02839 Dermatochalasis of unspecified eye, unspecified eyelid: Secondary | ICD-10-CM | POA: Diagnosis not present

## 2015-10-14 DIAGNOSIS — H2511 Age-related nuclear cataract, right eye: Secondary | ICD-10-CM | POA: Diagnosis not present

## 2015-10-24 ENCOUNTER — Other Ambulatory Visit: Payer: Self-pay | Admitting: Unknown Physician Specialty

## 2015-11-21 DIAGNOSIS — H2511 Age-related nuclear cataract, right eye: Secondary | ICD-10-CM | POA: Diagnosis not present

## 2015-11-22 DIAGNOSIS — H2512 Age-related nuclear cataract, left eye: Secondary | ICD-10-CM | POA: Diagnosis not present

## 2015-11-22 DIAGNOSIS — Z961 Presence of intraocular lens: Secondary | ICD-10-CM | POA: Diagnosis not present

## 2015-11-22 DIAGNOSIS — H2511 Age-related nuclear cataract, right eye: Secondary | ICD-10-CM | POA: Diagnosis not present

## 2015-11-22 DIAGNOSIS — H1859 Other hereditary corneal dystrophies: Secondary | ICD-10-CM | POA: Diagnosis not present

## 2015-11-22 DIAGNOSIS — H5201 Hypermetropia, right eye: Secondary | ICD-10-CM | POA: Diagnosis not present

## 2015-11-22 DIAGNOSIS — H43813 Vitreous degeneration, bilateral: Secondary | ICD-10-CM | POA: Diagnosis not present

## 2015-11-22 DIAGNOSIS — H02831 Dermatochalasis of right upper eyelid: Secondary | ICD-10-CM | POA: Diagnosis not present

## 2015-11-22 DIAGNOSIS — I10 Essential (primary) hypertension: Secondary | ICD-10-CM | POA: Diagnosis not present

## 2015-11-22 DIAGNOSIS — H52221 Regular astigmatism, right eye: Secondary | ICD-10-CM | POA: Diagnosis not present

## 2015-11-22 DIAGNOSIS — H02834 Dermatochalasis of left upper eyelid: Secondary | ICD-10-CM | POA: Diagnosis not present

## 2015-11-23 ENCOUNTER — Ambulatory Visit: Payer: Medicare Other | Admitting: Unknown Physician Specialty

## 2015-12-23 DIAGNOSIS — Z961 Presence of intraocular lens: Secondary | ICD-10-CM | POA: Diagnosis not present

## 2015-12-23 DIAGNOSIS — I10 Essential (primary) hypertension: Secondary | ICD-10-CM | POA: Diagnosis not present

## 2015-12-23 DIAGNOSIS — H02834 Dermatochalasis of left upper eyelid: Secondary | ICD-10-CM | POA: Diagnosis not present

## 2015-12-23 DIAGNOSIS — H2512 Age-related nuclear cataract, left eye: Secondary | ICD-10-CM | POA: Diagnosis not present

## 2015-12-23 DIAGNOSIS — H02831 Dermatochalasis of right upper eyelid: Secondary | ICD-10-CM | POA: Diagnosis not present

## 2015-12-23 DIAGNOSIS — H04123 Dry eye syndrome of bilateral lacrimal glands: Secondary | ICD-10-CM | POA: Diagnosis not present

## 2015-12-23 DIAGNOSIS — H43813 Vitreous degeneration, bilateral: Secondary | ICD-10-CM | POA: Diagnosis not present

## 2015-12-23 DIAGNOSIS — H1859 Other hereditary corneal dystrophies: Secondary | ICD-10-CM | POA: Diagnosis not present

## 2015-12-23 DIAGNOSIS — H52223 Regular astigmatism, bilateral: Secondary | ICD-10-CM | POA: Diagnosis not present

## 2015-12-23 DIAGNOSIS — H5203 Hypermetropia, bilateral: Secondary | ICD-10-CM | POA: Diagnosis not present

## 2015-12-27 ENCOUNTER — Ambulatory Visit (INDEPENDENT_AMBULATORY_CARE_PROVIDER_SITE_OTHER): Payer: Medicare Other | Admitting: Unknown Physician Specialty

## 2015-12-27 ENCOUNTER — Encounter: Payer: Self-pay | Admitting: Unknown Physician Specialty

## 2015-12-27 VITALS — BP 127/85 | HR 68 | Temp 97.7°F | Ht 66.5 in | Wt 213.8 lb

## 2015-12-27 DIAGNOSIS — K219 Gastro-esophageal reflux disease without esophagitis: Secondary | ICD-10-CM | POA: Diagnosis not present

## 2015-12-27 DIAGNOSIS — E669 Obesity, unspecified: Secondary | ICD-10-CM | POA: Diagnosis not present

## 2015-12-27 DIAGNOSIS — I10 Essential (primary) hypertension: Secondary | ICD-10-CM | POA: Diagnosis not present

## 2015-12-27 DIAGNOSIS — E785 Hyperlipidemia, unspecified: Secondary | ICD-10-CM

## 2015-12-27 DIAGNOSIS — Z0000 Encounter for general adult medical examination without abnormal findings: Secondary | ICD-10-CM

## 2015-12-27 DIAGNOSIS — Z23 Encounter for immunization: Secondary | ICD-10-CM | POA: Diagnosis not present

## 2015-12-27 MED ORDER — AMLODIPINE BESYLATE 5 MG PO TABS: 5.0000 mg | ORAL_TABLET | Freq: Every day | ORAL | 1 refills | Status: DC

## 2015-12-27 MED ORDER — VALSARTAN 160 MG PO TABS: 160.0000 mg | ORAL_TABLET | Freq: Every day | ORAL | 1 refills | Status: DC

## 2015-12-27 MED ORDER — METOPROLOL SUCCINATE ER 25 MG PO TB24: 25.0000 mg | ORAL_TABLET | Freq: Every day | ORAL | 1 refills | Status: DC

## 2015-12-27 NOTE — Progress Notes (Signed)
BP 127/85 (BP Location: Left Arm, Cuff Size: Large)   Pulse 68   Temp 97.7 F (36.5 C)   Ht 5' 6.5" (1.689 m)   Wt 213 lb 12.8 oz (97 kg)   SpO2 96%   BMI 33.99 kg/m    Subjective:    Patient ID: Melissa Merritt, female    DOB: Apr 08, 1951, 65 y.o.   MRN: [id number]  HPI: Melissa Merritt is a 65 y.o. female  Chief Complaint  Patient presents with  . Hypertension  . Gastroesophageal Reflux  . Labs Only    hep c order entered   Presents today for follow up regarding chronic conditions.   Hypertension Checking at home 130's/70's  Satisfied with current medications and taking as instructed.  Refusing statins for her cholesterol No chest pain, SOB, or edema  GERD Not currently having any symptoms. Satisfied with protonix.  Obesity Aware of weight problem, reports she has cut back on soda intake, but isn't exercising, although she stays active by watching her twin grandchildren.    Hep C drawn today.   Relevant past medical, surgical, family and social history reviewed and updated as indicated. Interim medical history since our last visit reviewed. Allergies and medications reviewed and updated.  Review of Systems  Respiratory: Negative.   Cardiovascular: Negative.     Per HPI unless specifically indicated above     Objective:    BP 127/85 (BP Location: Left Arm, Cuff Size: Large)   Pulse 68   Temp 97.7 F (36.5 C)   Ht 5' 6.5" (1.689 m)   Wt 213 lb 12.8 oz (97 kg)   SpO2 96%   BMI 33.99 kg/m   Wt Readings from Last 3 Encounters:  12/27/15 213 lb 12.8 oz (97 kg)  08/22/15 213 lb (96.6 kg)  05/23/15 213 lb 3.2 oz (96.7 kg)    Physical Exam  Constitutional: She is oriented to person, place, and time. She appears well-developed and well-nourished. No distress.  HENT:  Head: Normocephalic and atraumatic.  Eyes: Conjunctivae and lids are normal. Right eye exhibits no discharge. Left eye exhibits no discharge. No scleral icterus.  Neck:  Normal range of motion. Neck supple. No JVD present. Carotid bruit is not present.  Cardiovascular: Normal rate, regular rhythm and normal heart sounds.   Pulmonary/Chest: Effort normal and breath sounds normal.  Abdominal: Normal appearance. There is no splenomegaly or hepatomegaly.  Musculoskeletal: Normal range of motion.  Neurological: She is alert and oriented to person, place, and time.  Skin: Skin is warm, dry and intact. No rash noted. No pallor.  Psychiatric: She has a normal mood and affect. Her behavior is normal. Judgment and thought content normal.  Nursing note and vitals reviewed.   Results for orders placed or performed in visit on 05/23/15  Microalbumin, Urine Waived  Result Value Ref Range   Microalb, Ur Waived 80 (H) 0 - 19 mg/L   Creatinine, Urine Waived 300 10 - 300 mg/dL   Microalb/Creat Ratio <30 <30 mg/g  Comprehensive metabolic panel  Result Value Ref Range   Glucose 112 (H) 65 - 99 mg/dL   BUN 18 8 - 27 mg/dL   Creatinine, Ser 0.74 0.57 - 1.00 mg/dL   GFR calc non Af Amer 85 >59 mL/min/1.73   GFR calc Af Amer 98 >59 mL/min/1.73   BUN/Creatinine Ratio 24 11 - 26   Sodium 141 134 - 144 mmol/L   Potassium 4.3 3.5 - 5.2 mmol/L   Chloride 104  96 - 106 mmol/L   CO2 23 18 - 29 mmol/L   Calcium 9.6 8.7 - 10.3 mg/dL   Total Protein 6.6 6.0 - 8.5 g/dL   Albumin 4.3 3.6 - 4.8 g/dL   Globulin, Total 2.3 1.5 - 4.5 g/dL   Albumin/Globulin Ratio 1.9 1.1 - 2.5   Bilirubin Total 0.2 0.0 - 1.2 mg/dL   Alkaline Phosphatase 93 39 - 117 IU/L   AST 19 0 - 40 IU/L   ALT 16 0 - 32 IU/L  Lipid Panel w/o Chol/HDL Ratio  Result Value Ref Range   Cholesterol, Total 231 (H) 100 - 199 mg/dL   Triglycerides 125 0 - 149 mg/dL   HDL 61 >39 mg/dL   VLDL Cholesterol Cal 25 5 - 40 mg/dL   LDL Calculated 145 (H) 0 - 99 mg/dL      Assessment & Plan:   Problem List Items Addressed This Visit      Unprioritized   BP (high blood pressure)    Stable, continue with regimen.        Relevant Medications   amLODipine (NORVASC) 5 MG tablet   metoprolol succinate (TOPROL-XL) 25 MG 24 hr tablet   valsartan (DIOVAN) 160 MG tablet   GERD (gastroesophageal reflux disease)    Stable, continue with current regimen.       Hyperlipidemia    Refusing statins      Relevant Medications   amLODipine (NORVASC) 5 MG tablet   metoprolol succinate (TOPROL-XL) 25 MG 24 hr tablet   valsartan (DIOVAN) 160 MG tablet   Obesity    Will continue to work on decreasing soda and candy intake.   Encouraged some sort of physical exercise, such as walking, at least 3 days a week.        Other Visit Diagnoses    Health care maintenance    -  Primary   Relevant Orders   Hepatitis C antibody   Need for diphtheria-tetanus-pertussis (Tdap) vaccine, adult/adolescent       Relevant Orders   Tdap vaccine greater than or equal to 7yo IM (Completed)   Need for influenza vaccination       Relevant Orders   Flu vaccine HIGH DOSE PF (Completed)       Follow up plan: Return in about 6 months (around 06/25/2016) for physical.

## 2015-12-27 NOTE — Assessment & Plan Note (Addendum)
Will continue to work on decreasing soda and candy intake.   Encouraged some sort of physical exercise, such as walking, at least 3 days a week.

## 2015-12-27 NOTE — Assessment & Plan Note (Signed)
Stable, continue with current regimen.

## 2015-12-27 NOTE — Assessment & Plan Note (Addendum)
Stable, continue with regimen.

## 2015-12-27 NOTE — Assessment & Plan Note (Signed)
Refusing statins 

## 2015-12-27 NOTE — Patient Instructions (Addendum)
Influenza (Flu) Vaccine (Inactivated or Recombinant):  1. Why get vaccinated? Influenza ("flu") is a contagious disease that spreads around the [place] every year, usually between October and May. Flu is caused by influenza viruses, and is spread mainly by coughing, sneezing, and close contact. Anyone can get flu. Flu strikes suddenly and can last several days. Symptoms vary by age, but can include:  fever/chills  sore throat  muscle aches  fatigue  cough  headache  runny or stuffy nose Flu can also lead to pneumonia and blood infections, and cause diarrhea and seizures in children. If you have a medical condition, such as heart or lung disease, flu can make it worse. Flu is more dangerous for some people. Infants and young children, people 65 years of age and older, pregnant women, and people with certain health conditions or a weakened immune system are at greatest risk. Each year thousands of people in the [place] die from flu, and many more are hospitalized. Flu vaccine can:  keep you from getting flu,  make flu less severe if you do get it, and  keep you from spreading flu to your family and other people. 2. Inactivated and recombinant flu vaccines A dose of flu vaccine is recommended every flu season. Children 6 months through 8 years of age may need two doses during the same flu season. Everyone else needs only one dose each flu season. Some inactivated flu vaccines contain a very small amount of a mercury-based preservative called thimerosal. Studies have not shown thimerosal in vaccines to be harmful, but flu vaccines that do not contain thimerosal are available. There is no live flu virus in flu shots. They cannot cause the flu. There are many flu viruses, and they are always changing. Each year a new flu vaccine is made to protect against three or four viruses that are likely to cause disease in the upcoming flu season. But even when the vaccine doesn't exactly  match these viruses, it may still provide some protection. Flu vaccine cannot prevent:  flu that is caused by a virus not covered by the vaccine, or  illnesses that look like flu but are not. It takes about 2 weeks for protection to develop after vaccination, and protection lasts through the flu season. 3. Some people should not get this vaccine Tell the person who is giving you the vaccine:  If you have any severe, life-threatening allergies. If you ever had a life-threatening allergic reaction after a dose of flu vaccine, or have a severe allergy to any part of this vaccine, you may be advised not to get vaccinated. Most, but not all, types of flu vaccine contain a small amount of egg protein.  If you ever had Guillain-Barre Syndrome (also called GBS). Some people with a history of GBS should not get this vaccine. This should be discussed with your doctor.  If you are not feeling well. It is usually okay to get flu vaccine when you have a mild illness, but you might be asked to come back when you feel better. 4. Risks of a vaccine reaction With any medicine, including vaccines, there is a chance of reactions. These are usually mild and go away on their own, but serious reactions are also possible. Most people who get a flu shot do not have any problems with it. Minor problems following a flu shot include:  soreness, redness, or swelling where the shot was given  hoarseness  sore, red or itchy eyes  cough    fever  aches  headache  itching  fatigue If these problems occur, they usually begin soon after the shot and last 1 or 2 days. More serious problems following a flu shot can include the following:  There may be a small increased risk of Guillain-Barre Syndrome (GBS) after inactivated flu vaccine. This risk has been estimated at 1 or 2 additional cases per million people vaccinated. This is much lower than the risk of severe complications from flu, which can be prevented by  flu vaccine.  Young children who get the flu shot along with pneumococcal vaccine (PCV13) and/or DTaP vaccine at the same time might be slightly more likely to have a seizure caused by fever. Ask your doctor for more information. Tell your doctor if a child who is getting flu vaccine has ever had a seizure. Problems that could happen after any injected vaccine:  People sometimes faint after a medical procedure, including vaccination. Sitting or lying down for about 15 minutes can help prevent fainting, and injuries caused by a fall. Tell your doctor if you feel dizzy, or have vision changes or ringing in the ears.  Some people get severe pain in the shoulder and have difficulty moving the arm where a shot was given. This happens very rarely.  Any medication can cause a severe allergic reaction. Such reactions from a vaccine are very rare, estimated at about 1 in a million doses, and would happen within a few minutes to a few hours after the vaccination. As with any medicine, there is a very remote chance of a vaccine causing a serious injury or death. The safety of vaccines is always being monitored. For more information, visit: www.cdc.gov/vaccinesafety/ 5. What if there is a serious reaction? What should I look for?  Look for anything that concerns you, such as signs of a severe allergic reaction, very high fever, or unusual behavior. Signs of a severe allergic reaction can include hives, swelling of the face and throat, difficulty breathing, a fast heartbeat, dizziness, and weakness. These would start a few minutes to a few hours after the vaccination. What should I do?  If you think it is a severe allergic reaction or other emergency that can't wait, call 9-1-1 and get the person to the nearest hospital. Otherwise, call your doctor.  Reactions should be reported to the Vaccine Adverse Event Reporting System (VAERS). Your doctor should file this report, or you can do it yourself through the  VAERS web site at www.vaers.hhs.gov, or by calling 1-800-822-7967. VAERS does not give medical advice. 6. The National Vaccine Injury Compensation Program The National Vaccine Injury Compensation Program (VICP) is a federal program that was created to compensate people who may have been injured by certain vaccines. Persons who believe they may have been injured by a vaccine can learn about the program and about filing a claim by calling 1-800-338-2382 or visiting the VICP website at www.hrsa.gov/vaccinecompensation. There is a time limit to file a claim for compensation. 7. How can I learn more?  Ask your healthcare provider. He or she can give you the vaccine package insert or suggest other sources of information.  Call your local or state health department.  Contact the Centers for Disease Control and Prevention (CDC):  Call 1-800-232-4636 (1-800-CDC-INFO) or  Visit CDC's website at www.cdc.gov/flu Vaccine Information Statement Inactivated Influenza Vaccine (11/13/2013)   This information is not intended to replace advice given to you by your health care provider. Make sure you discuss any questions you have with   your health care provider.   Document Released: 01/18/2006 Document Revised: 04/16/2014 Document Reviewed: 11/16/2013 Elsevier Interactive Patient Education 2016 [employer]. Tdap Vaccine (Tetanus, Diphtheria and Pertussis): What You Need to Know 1. Why get vaccinated? Tetanus, diphtheria and pertussis are very serious diseases. Tdap vaccine can protect [name] from these diseases. And, Tdap vaccine given to pregnant women can protect newborn babies against pertussis. TETANUS (Lockjaw) is rare in the [place] today. It causes painful muscle tightening and stiffness, usually all over the body.  It can lead to tightening of muscles in the head and neck so you can't open your mouth, swallow, or sometimes even breathe. Tetanus kills about 1 out of 10 people who are infected even  after receiving the best medical care. DIPHTHERIA is also rare in the [place] today. It can cause a thick coating to form in the back of the throat.  It can lead to breathing problems, heart failure, paralysis, and death. PERTUSSIS (Whooping Cough) causes severe coughing spells, which can cause difficulty breathing, vomiting and disturbed sleep.  It can also lead to weight loss, incontinence, and rib fractures. Up to 2 in 100 adolescents and 5 in 100 adults with pertussis are hospitalized or have complications, which could include pneumonia or death. These diseases are caused by bacteria. Diphtheria and pertussis are spread from person to person through secretions from coughing or sneezing. Tetanus enters the body through cuts, scratches, or wounds. Before vaccines, as many as 200,000 cases of diphtheria, 200,000 cases of pertussis, and hundreds of cases of tetanus, were reported in the [place] each year. Since vaccination began, reports of cases for tetanus and diphtheria have dropped by about 99% and for pertussis by about 80%. 2. Tdap vaccine Tdap vaccine can protect adolescents and adults from tetanus, diphtheria, and pertussis. One dose of Tdap is routinely given at age 25 or 65. People who did not get Tdap at that age should get it as soon as possible. Tdap is especially important for healthcare professionals and anyone having close contact with a baby younger than 12 months. Pregnant women should get a dose of Tdap during every pregnancy, to protect the newborn from pertussis. Infants are most at risk for severe, life-threatening complications from pertussis. Another vaccine, called Td, protects against tetanus and diphtheria, but not pertussis. A Td booster should be given every 10 years. Tdap may be given as one of these boosters if you have never gotten Tdap before. Tdap may also be given after a severe cut or burn to prevent tetanus infection. Your doctor or the person giving you  the vaccine can give you more information. Tdap may safely be given at the same time as other vaccines. 3. Some people should not get this vaccine  A person who has ever had a life-threatening allergic reaction after a previous dose of any diphtheria, tetanus or pertussis containing vaccine, OR has a severe allergy to any part of this vaccine, should not get Tdap vaccine. Tell the person giving the vaccine about any severe allergies.  Anyone who had coma or long repeated seizures within 7 days after a childhood dose of DTP or DTaP, or a previous dose of Tdap, should not get Tdap, unless a cause other than the vaccine was found. They can still get Td.  Talk to your doctor if you:  have seizures or another nervous system problem,  had severe pain or swelling after any vaccine containing diphtheria, tetanus or pertussis,  ever had a condition called  Guillain-Barr Syndrome (GBS),  aren't feeling well on the day the shot is scheduled. 4. Risks With any medicine, including vaccines, there is a chance of side effects. These are usually mild and go away on their own. Serious reactions are also possible but are rare. Most people who get Tdap vaccine do not have any problems with it. Mild problems following Tdap (Did not interfere with activities)  Pain where the shot was given (about 3 in 4 adolescents or 2 in 3 adults)  Redness or swelling where the shot was given (about 1 person in 5)  Mild fever of at least 100.45F (up to about 1 in 25 adolescents or 1 in 100 adults)  Headache (about 3 or 4 people in 10)  Tiredness (about 1 person in 3 or 4)  Nausea, vomiting, diarrhea, stomach ache (up to 1 in 4 adolescents or 1 in 10 adults)  Chills, sore joints (about 1 person in 10)  Body aches (about 1 person in 3 or 4)  Rash, swollen glands (uncommon) Moderate problems following Tdap (Interfered with activities, but did not require medical attention)  Pain where the shot was given (up to 1  in 5 or 6)  Redness or swelling where the shot was given (up to about 1 in 16 adolescents or 1 in 12 adults)  Fever over 102F (about 1 in 100 adolescents or 1 in 250 adults)  Headache (about 1 in 7 adolescents or 1 in 10 adults)  Nausea, vomiting, diarrhea, stomach ache (up to 1 or 3 people in 100)  Swelling of the entire arm where the shot was given (up to about 1 in 500). Severe problems following Tdap (Unable to perform usual activities; required medical attention)  Swelling, severe pain, bleeding and redness in the arm where the shot was given (rare). Problems that could happen after any vaccine:  People sometimes faint after a medical procedure, including vaccination. Sitting or lying down for about 15 minutes can help prevent fainting, and injuries caused by a fall. Tell your doctor if you feel dizzy, or have vision changes or ringing in the ears.  Some people get severe pain in the shoulder and have difficulty moving the arm where a shot was given. This happens very rarely.  Any medication can cause a severe allergic reaction. Such reactions from a vaccine are very rare, estimated at fewer than 1 in a million doses, and would happen within a few minutes to a few hours after the vaccination. As with any medicine, there is a very remote chance of a vaccine causing a serious injury or death. The safety of vaccines is always being monitored. For more information, visit: http://www.aguilar.org/ 5. What if there is a serious problem? What should I look for?  Look for anything that concerns you, such as signs of a severe allergic reaction, very high fever, or unusual behavior.  Signs of a severe allergic reaction can include hives, swelling of the face and throat, difficulty breathing, a fast heartbeat, dizziness, and weakness. These would usually start a few minutes to a few hours after the vaccination. What should I do?  If you think it is a severe allergic reaction or other  emergency that can't wait, call 9-1-1 or get the person to the nearest hospital. Otherwise, call your doctor.  Afterward, the reaction should be reported to the Vaccine Adverse Event Reporting System (VAERS). Your doctor might file this report, or you can do it yourself through the VAERS web site at www.vaers.SamedayNews.es, or  by calling 770-556-9827. VAERS does not give medical advice.  6. The National Vaccine Injury Compensation Program The [employer] Vaccine Injury Compensation Program (VICP) is a federal program that was created to compensate people who may have been injured by certain vaccines. Persons who believe they may have been injured by a vaccine can learn about the program and about filing a claim by calling (807)113-5962 or visiting the [employer] website at GoldCloset.com.ee. There is a time limit to file a claim for compensation. 7. How can I learn more?  Ask your doctor. He or she can give you the vaccine package insert or suggest other sources of information.  Call your local or state health department.  Contact the Centers for Disease Control and Prevention (CDC):  Call 573-641-7611 (1-800-CDC-INFO) or  Visit CDC's website at http://hunter.com/ CDC Tdap Vaccine VIS (06/02/13)   This information is not intended to replace advice given to you by your health care provider. Make sure you discuss any questions you have with your health care provider.   Document Released: 09/25/2011 Document Revised: 04/16/2014 Document Reviewed: 07/08/2013 Elsevier Interactive Patient Education [employer].

## 2015-12-28 ENCOUNTER — Encounter: Payer: Self-pay | Admitting: Unknown Physician Specialty

## 2015-12-28 LAB — HEPATITIS C ANTIBODY

## 2016-01-04 DIAGNOSIS — I10 Essential (primary) hypertension: Secondary | ICD-10-CM | POA: Diagnosis not present

## 2016-01-04 DIAGNOSIS — I6529 Occlusion and stenosis of unspecified carotid artery: Secondary | ICD-10-CM | POA: Diagnosis not present

## 2016-01-04 DIAGNOSIS — I6523 Occlusion and stenosis of bilateral carotid arteries: Secondary | ICD-10-CM | POA: Diagnosis not present

## 2016-01-04 DIAGNOSIS — E785 Hyperlipidemia, unspecified: Secondary | ICD-10-CM | POA: Diagnosis not present

## 2016-01-04 DIAGNOSIS — I8310 Varicose veins of unspecified lower extremity with inflammation: Secondary | ICD-10-CM | POA: Diagnosis not present

## 2016-01-26 ENCOUNTER — Other Ambulatory Visit: Payer: Self-pay | Admitting: Unknown Physician Specialty

## 2016-01-27 NOTE — Telephone Encounter (Signed)
Your patient 

## 2016-02-28 ENCOUNTER — Other Ambulatory Visit: Payer: Self-pay | Admitting: Obstetrics and Gynecology

## 2016-02-28 DIAGNOSIS — N814 Uterovaginal prolapse, unspecified: Secondary | ICD-10-CM | POA: Diagnosis not present

## 2016-02-28 DIAGNOSIS — Z01411 Encounter for gynecological examination (general) (routine) with abnormal findings: Secondary | ICD-10-CM | POA: Diagnosis not present

## 2016-02-28 DIAGNOSIS — Z1231 Encounter for screening mammogram for malignant neoplasm of breast: Secondary | ICD-10-CM | POA: Diagnosis not present

## 2016-02-28 DIAGNOSIS — M8580 Other specified disorders of bone density and structure, unspecified site: Secondary | ICD-10-CM | POA: Diagnosis not present

## 2016-03-12 DIAGNOSIS — H60339 Swimmer's ear, unspecified ear: Secondary | ICD-10-CM | POA: Diagnosis not present

## 2016-03-12 DIAGNOSIS — H6060 Unspecified chronic otitis externa, unspecified ear: Secondary | ICD-10-CM | POA: Diagnosis not present

## 2016-03-23 DIAGNOSIS — I519 Heart disease, unspecified: Secondary | ICD-10-CM | POA: Diagnosis not present

## 2016-03-23 DIAGNOSIS — G4733 Obstructive sleep apnea (adult) (pediatric): Secondary | ICD-10-CM | POA: Diagnosis not present

## 2016-03-23 DIAGNOSIS — I10 Essential (primary) hypertension: Secondary | ICD-10-CM | POA: Diagnosis not present

## 2016-04-11 ENCOUNTER — Ambulatory Visit
Admission: RE | Admit: 2016-04-11 | Discharge: 2016-04-11 | Disposition: A | Discharge status: Home / Self Care | Payer: Medicare Other | Source: Ambulatory Visit | Attending: Obstetrics and Gynecology | Admitting: Obstetrics and Gynecology

## 2016-04-11 DIAGNOSIS — Z1231 Encounter for screening mammogram for malignant neoplasm of breast: Secondary | ICD-10-CM

## 2016-04-13 ENCOUNTER — Ambulatory Visit (INDEPENDENT_AMBULATORY_CARE_PROVIDER_SITE_OTHER): Payer: Medicare Other | Admitting: Family Medicine

## 2016-04-13 ENCOUNTER — Encounter: Payer: Self-pay | Admitting: Family Medicine

## 2016-04-13 VITALS — BP 123/78 | HR 70 | Temp 98.4°F | Wt 214.0 lb

## 2016-04-13 DIAGNOSIS — J069 Acute upper respiratory infection, unspecified: Secondary | ICD-10-CM

## 2016-04-13 MED ORDER — ERYTHROMYCIN 5 MG/GM OP OINT: 1.0000 "application " | TOPICAL_OINTMENT | Freq: Every day | OPHTHALMIC | 0 refills | Status: DC

## 2016-04-13 MED ORDER — AZITHROMYCIN 250 MG PO TABS: ORAL_TABLET | ORAL | 0 refills | Status: DC

## 2016-04-13 NOTE — Patient Instructions (Signed)
Follow up if no improvement 

## 2016-04-13 NOTE — Progress Notes (Addendum)
   BP 123/78   Pulse 70   Temp 98.4 F (36.9 C)   Wt 214 lb (97.1 kg)   SpO2 95%   BMI 34.02 kg/m    Subjective:    Patient ID: Melissa Merritt, female    DOB: 1951/01/07, 66 y.o.   MRN: [id number]  HPI: Melissa Merritt is a 66 y.o. female  Chief Complaint  Patient presents with  . URI    x 1 week. Head congestion, productive cough, headache, runny nose, ears congested. Sore throat is improved. No chest congestion.  . Conjunctivitis    right eye x 3 days. Draining, crusts over at night.    1 week history of congestion, productive cough of thick sputum, HA, rhinorrhea, ear pressure. Denies fever, chills, aches, sore throat. Taking alka seltzer cold medicines. Grandchildren were sick over the holidays around when hers started.   Relevant past medical, surgical, family and social history reviewed and updated as indicated. Interim medical history since our last visit reviewed. Allergies and medications reviewed and updated.  Review of Systems  Constitutional: Negative.   HENT: Positive for congestion, ear pain and rhinorrhea.   Eyes: Negative.   Respiratory: Positive for cough.   Cardiovascular: Negative.   Gastrointestinal: Negative.   Genitourinary: Negative.   Musculoskeletal: Negative.   Neurological: Positive for headaches.  Psychiatric/Behavioral: Negative.     Per HPI unless specifically indicated above     Objective:    BP 123/78   Pulse 70   Temp 98.4 F (36.9 C)   Wt 214 lb (97.1 kg)   SpO2 95%   BMI 34.02 kg/m   Wt Readings from Last 3 Encounters:  04/13/16 214 lb (97.1 kg)  12/27/15 213 lb 12.8 oz (97 kg)  08/22/15 213 lb (96.6 kg)    Physical Exam  Constitutional: She is oriented to person, place, and time. She appears well-developed and well-nourished. No distress.  HENT:  Head: Atraumatic.  Eyes: Conjunctivae are normal. Pupils are equal, round, and reactive to light.  Neck: Normal range of motion. Neck supple.    Cardiovascular: Normal rate and normal heart sounds.   Pulmonary/Chest: Effort normal and breath sounds normal. No respiratory distress.  Musculoskeletal: Normal range of motion.  Lymphadenopathy:    She has no cervical adenopathy.  Neurological: She is alert and oriented to person, place, and time.  Skin: Skin is warm and dry.  Psychiatric: She has a normal mood and affect. Her behavior is normal.  Nursing note and vitals reviewed.   Visual Acuity: uncorrected Right - 20/20 Left - 20/40    Assessment & Plan:   Problem List Items Addressed This Visit    None    Visit Diagnoses    Upper respiratory tract infection, unspecified type    -  Primary   Will treat with azithromycin. Continue supportive care. Follow up if no improvement.    Relevant Medications   azithromycin (ZITHROMAX) 250 MG tablet       Follow up plan: Return if symptoms worsen or fail to improve.

## 2016-04-30 ENCOUNTER — Other Ambulatory Visit: Payer: Self-pay | Admitting: Unknown Physician Specialty

## 2016-05-23 ENCOUNTER — Ambulatory Visit (INDEPENDENT_AMBULATORY_CARE_PROVIDER_SITE_OTHER): Payer: Medicare Other | Admitting: Unknown Physician Specialty

## 2016-05-23 ENCOUNTER — Encounter: Payer: Self-pay | Admitting: Unknown Physician Specialty

## 2016-05-23 VITALS — BP 138/83 | HR 71 | Temp 98.5°F | Ht 66.0 in | Wt 217.4 lb

## 2016-05-23 DIAGNOSIS — Z0000 Encounter for general adult medical examination without abnormal findings: Secondary | ICD-10-CM

## 2016-05-23 DIAGNOSIS — R7301 Impaired fasting glucose: Secondary | ICD-10-CM | POA: Diagnosis not present

## 2016-05-23 DIAGNOSIS — D1430 Benign neoplasm of unspecified bronchus and lung: Secondary | ICD-10-CM | POA: Diagnosis not present

## 2016-05-23 DIAGNOSIS — R42 Dizziness and giddiness: Secondary | ICD-10-CM | POA: Diagnosis not present

## 2016-05-23 DIAGNOSIS — E8881 Metabolic syndrome: Secondary | ICD-10-CM

## 2016-05-23 DIAGNOSIS — I10 Essential (primary) hypertension: Secondary | ICD-10-CM | POA: Diagnosis not present

## 2016-05-23 DIAGNOSIS — E782 Mixed hyperlipidemia: Secondary | ICD-10-CM | POA: Diagnosis not present

## 2016-05-23 LAB — BAYER DCA HB A1C WAIVED: HB A1C: 5.5 % (ref <7.0)

## 2016-05-23 NOTE — Assessment & Plan Note (Signed)
Stable, continue present medications.   

## 2016-05-23 NOTE — Assessment & Plan Note (Signed)
Discussed diet and exercise 

## 2016-05-23 NOTE — Assessment & Plan Note (Signed)
Check cholesterol.

## 2016-05-23 NOTE — Progress Notes (Signed)
BP 138/83 (BP Location: Left Arm, Cuff Size: Large)   Pulse 71   Temp 98.5 F (36.9 C)   Ht 5\' 6"  (1.676 m)   Wt 217 lb 6.4 oz (98.6 kg)   SpO2 98%   BMI 35.09 kg/m    Subjective:    Patient ID: Melissa Merritt, female    DOB: 07-22-1950, 66 y.o.   MRN: [id number]  HPI: Melissa Merritt is a 66 y.o. female  Chief Complaint  Patient presents with  . Medicare Wellness   Functional Status Survey: Is the patient deaf or have difficulty hearing?: No Does the patient have difficulty seeing, even when wearing glasses/contacts?: No Does the patient have difficulty concentrating, remembering, or making decisions?: No Does the patient have difficulty walking or climbing stairs?: Yes Does the patient have difficulty dressing or bathing?: No Does the patient have difficulty doing errands alone such as visiting a doctor's office or shopping?: No  Fall Risk  05/23/2016 05/23/2015  Falls in the past year? Yes Yes  Number falls in past yr: 2 or more 1  Injury with Fall? No No   Depression screen [facility] 2/9 05/23/2016 05/23/2015  Decreased Interest 0 0  Down, Depressed, Hopeless 0 0  PHQ - 2 Score 0 0  Altered sleeping 0 -  Tired, decreased energy 1 -  Change in appetite 0 -  Feeling bad or failure about yourself  0 -  Trouble concentrating 0 -  Moving slowly or fidgety/restless 0 -  Suicidal thoughts 0 -  PHQ-9 Score 1 -   Hypertension Using medications without difficulty Average home BPs 130s/70's   No problems or lightheadedness No chest pain with exertion or shortness of breath No Edema  Relevant past medical, surgical, family and social history reviewed and updated as indicated. Interim medical history since our last visit reviewed. Allergies and medications reviewed and updated.  Review of Systems  Constitutional: Negative.   HENT: Negative.   Eyes: Negative.   Respiratory: Negative.   Cardiovascular: Negative.   Gastrointestinal: Negative.        GERD  stable on present meds  Endocrine: Negative.   Genitourinary: Negative.   Musculoskeletal: Negative.   Skin: Negative.   Allergic/Immunologic: Negative.   Neurological: Negative.   Hematological: Negative.   Psychiatric/Behavioral: Negative.     Per HPI unless specifically indicated above     Objective:    BP 138/83 (BP Location: Left Arm, Cuff Size: Large)   Pulse 71   Temp 98.5 F (36.9 C)   Ht 5\' 6"  (1.676 m)   Wt 217 lb 6.4 oz (98.6 kg)   SpO2 98%   BMI 35.09 kg/m   Wt Readings from Last 3 Encounters:  05/23/16 217 lb 6.4 oz (98.6 kg)  04/13/16 214 lb (97.1 kg)  12/27/15 213 lb 12.8 oz (97 kg)    Physical Exam  Constitutional: She is oriented to person, place, and time. She appears well-developed and well-nourished. No distress.  HENT:  Head: Normocephalic and atraumatic.  Eyes: Conjunctivae and lids are normal. Pupils are equal, round, and reactive to light. Right eye exhibits no discharge. Left eye exhibits no discharge. No scleral icterus.  Neck: Normal range of motion. Neck supple. No JVD present. Carotid bruit is not present. No thyromegaly present.  Cardiovascular: Normal rate, regular rhythm and normal heart sounds.  Exam reveals no gallop and no friction rub.   No murmur heard. Pulmonary/Chest: Effort normal and breath sounds normal. No respiratory distress. She has  no wheezes. She has no rales.  Abdominal: Soft. Normal appearance and bowel sounds are normal. There is no splenomegaly or hepatomegaly. There is no tenderness. There is no rebound.  Genitourinary: No breast swelling, tenderness or discharge.  Musculoskeletal: Normal range of motion.  Lymphadenopathy:    She has no cervical adenopathy.  Neurological: She is alert and oriented to person, place, and time.  Skin: Skin is warm, dry and intact. No rash noted. No pallor.  Psychiatric: She has a normal mood and affect. Her speech is normal and behavior is normal. Judgment and thought content normal.  Cognition and memory are normal.      Assessment & Plan:   Problem List Items Addressed This Visit      Unprioritized   Benign tumor of lung   Relevant Orders   DG Chest 2 View   BP (high blood pressure)    Stable, continue present medications.        Relevant Medications   valsartan (DIOVAN) 320 MG tablet   Other Relevant Orders   Comprehensive metabolic panel   Lipid Panel w/o Chol/HDL Ratio   Hyperlipidemia    Check cholesterol      Relevant Medications   valsartan (DIOVAN) 320 MG tablet   Metabolic syndrome    Discussed diet and exercise.        Orthostatic dizziness - Primary    Other Visit Diagnoses    Annual physical exam       IFG (impaired fasting glucose)       Relevant Orders   Bayer DCA Hb A1c Waived       Follow up plan: Return in about 6 months (around 11/20/2016).

## 2016-05-24 LAB — LIPID PANEL W/O CHOL/HDL RATIO
Cholesterol, Total: 212 mg/dL — ABNORMAL HIGH (ref 100–199)
HDL: 59 mg/dL (ref >39)
LDL Calculated: 131 mg/dL — ABNORMAL HIGH (ref 0–99)
TRIGLYCERIDES: 112 mg/dL (ref 0–149)
VLDL CHOLESTEROL CAL: 22 mg/dL (ref 5–40)

## 2016-05-24 LAB — COMPREHENSIVE METABOLIC PANEL
A/G RATIO: 1.6 (ref 1.2–2.2)
ALT: 12 IU/L (ref 0–32)
AST: 16 IU/L (ref 0–40)
Albumin: 4.2 g/dL (ref 3.6–4.8)
Alkaline Phosphatase: 102 IU/L (ref 39–117)
BUN/Creatinine Ratio: 21 (ref 12–28)
BUN: 17 mg/dL (ref 8–27)
Bilirubin Total: 0.4 mg/dL (ref 0.0–1.2)
CO2: 23 mmol/L (ref 18–29)
CREATININE: 0.81 mg/dL (ref 0.57–1.00)
Calcium: 9.9 mg/dL (ref 8.7–10.3)
Chloride: 101 mmol/L (ref 96–106)
GFR calc Af Amer: 87 mL/min/{1.73_m2} (ref >59)
GFR, EST NON AFRICAN AMERICAN: 75 mL/min/{1.73_m2} (ref >59)
GLUCOSE: 115 mg/dL — AB (ref 65–99)
Globulin, Total: 2.6 g/dL (ref 1.5–4.5)
POTASSIUM: 4.2 mmol/L (ref 3.5–5.2)
Sodium: 142 mmol/L (ref 134–144)
TOTAL PROTEIN: 6.8 g/dL (ref 6.0–8.5)

## 2016-05-28 ENCOUNTER — Ambulatory Visit
Admission: RE | Admit: 2016-05-28 | Discharge: 2016-05-28 | Disposition: A | Discharge status: Home / Self Care | Payer: Medicare Other | Source: Ambulatory Visit | Attending: Unknown Physician Specialty | Admitting: Unknown Physician Specialty

## 2016-05-28 ENCOUNTER — Encounter: Payer: Self-pay | Admitting: Unknown Physician Specialty

## 2016-05-28 DIAGNOSIS — I700 Atherosclerosis of aorta: Secondary | ICD-10-CM | POA: Diagnosis not present

## 2016-05-28 DIAGNOSIS — D1430 Benign neoplasm of unspecified bronchus and lung: Secondary | ICD-10-CM

## 2016-05-28 DIAGNOSIS — I517 Cardiomegaly: Secondary | ICD-10-CM | POA: Diagnosis not present

## 2016-05-30 NOTE — Progress Notes (Signed)
Patient notified of results by phone.

## 2016-07-30 ENCOUNTER — Other Ambulatory Visit: Payer: Self-pay | Admitting: Unknown Physician Specialty

## 2016-09-20 DIAGNOSIS — I10 Essential (primary) hypertension: Secondary | ICD-10-CM | POA: Diagnosis not present

## 2016-09-20 DIAGNOSIS — R42 Dizziness and giddiness: Secondary | ICD-10-CM | POA: Diagnosis not present

## 2016-09-20 DIAGNOSIS — I519 Heart disease, unspecified: Secondary | ICD-10-CM | POA: Diagnosis not present

## 2016-09-20 DIAGNOSIS — G4733 Obstructive sleep apnea (adult) (pediatric): Secondary | ICD-10-CM | POA: Diagnosis not present

## 2016-10-19 DIAGNOSIS — I519 Heart disease, unspecified: Secondary | ICD-10-CM | POA: Diagnosis not present

## 2016-10-19 DIAGNOSIS — I10 Essential (primary) hypertension: Secondary | ICD-10-CM | POA: Diagnosis not present

## 2016-10-23 ENCOUNTER — Telehealth: Payer: Self-pay | Admitting: Unknown Physician Specialty

## 2016-10-23 MED ORDER — LOSARTAN POTASSIUM 100 MG PO TABS: 100.0000 mg | ORAL_TABLET | Freq: Every day | ORAL | 3 refills | Status: DC

## 2016-10-23 NOTE — Telephone Encounter (Signed)
OK, called in Losartan

## 2016-10-23 NOTE — Telephone Encounter (Signed)
Patient notified about medication change  

## 2016-10-23 NOTE — Telephone Encounter (Signed)
Patient needs to get a new script sent to [facility] on the following medication which one is the valsartan medication as there has been a recall on this med.  [place]  Or call her back at [facility]

## 2016-10-23 NOTE — Telephone Encounter (Signed)
Called and spoke to [place], the pharmacist at [facility]. She stated that all strengths are being recalled and she is not sure when they will be available again.

## 2016-10-23 NOTE — Telephone Encounter (Signed)
Called and spoke to patient. She states that the pharmacy will not fill her valsartan because of a recall on the medication. She would like something different sent in to replace the valsartan.

## 2016-10-23 NOTE — Telephone Encounter (Signed)
Please ask the pharmacy if the can have 80 mg of Valsartan.  Thanks

## 2016-11-21 ENCOUNTER — Encounter: Payer: Self-pay | Admitting: Unknown Physician Specialty

## 2016-11-21 ENCOUNTER — Ambulatory Visit (INDEPENDENT_AMBULATORY_CARE_PROVIDER_SITE_OTHER): Payer: Medicare Other | Admitting: Unknown Physician Specialty

## 2016-11-21 DIAGNOSIS — E782 Mixed hyperlipidemia: Secondary | ICD-10-CM

## 2016-11-21 DIAGNOSIS — I10 Essential (primary) hypertension: Secondary | ICD-10-CM | POA: Diagnosis not present

## 2016-11-21 NOTE — Assessment & Plan Note (Addendum)
Reviewed ASCVD calculator.  Risk score 11.9% Refusing statins.  She has lost weight.

## 2016-11-21 NOTE — Progress Notes (Signed)
BP (!) 146/79   Pulse 74   Temp 98.3 F (36.8 C)   Ht 5\' 6"  (1.676 m)   Wt 210 lb 8 oz (95.5 kg)   SpO2 95%   BMI 33.98 kg/m    Subjective:    Patient ID: Melissa Merritt, female    DOB: 12-16-1950, 66 y.o.   MRN: [id number]  HPI: Melissa Merritt is a 66 y.o. female  Chief Complaint  Patient presents with  . Hyperlipidemia  . Hypertension    pt states she is still taking the valsartan and not losartan. states the pharmacy told her that her valsartan was not recalled    Hypertension Pt states her Melissa Merritt was not recalled and continues to take it.  She is planning on keeping her Losartan "just in case." Using medications without difficulty Average home BPs SBP 130's   No problems or lightheadedness No chest pain with exertion or shortness of breath No Edema  Hyperlipidemia Not taking medications. No Muscle aches  Diet compliance:Exercise: Lost 7 pounds and "chasing kids."  The 10-year ASCVD risk score Melissa Merritt., et al., 2013) is: 11.9%   Values used to calculate the score:     Age: 22 years     Sex: Female     Is Non-Hispanic African American: No     Diabetic: No     Tobacco smoker: No     Systolic Blood Pressure: 497 mmHg     Is BP treated: Yes     HDL Cholesterol: 59 mg/dL     Total Cholesterol: 212 mg/dL  Relevant past medical, surgical, family and social history reviewed and updated as indicated. Interim medical history since our last visit reviewed. Allergies and medications reviewed and updated.  Review of Systems  Per HPI unless specifically indicated above     Objective:    BP (!) 146/79   Pulse 74   Temp 98.3 F (36.8 C)   Ht 5\' 6"  (1.676 m)   Wt 210 lb 8 oz (95.5 kg)   SpO2 95%   BMI 33.98 kg/m   Wt Readings from Last 3 Encounters:  11/21/16 210 lb 8 oz (95.5 kg)  05/23/16 217 lb 6.4 oz (98.6 kg)  04/13/16 214 lb (97.1 kg)    Physical Exam  Constitutional: She is oriented to person, place, and time. She appears  well-developed and well-nourished. No distress.  HENT:  Head: Normocephalic and atraumatic.  Eyes: Conjunctivae and lids are normal. Right eye exhibits no discharge. Left eye exhibits no discharge. No scleral icterus.  Neck: Normal range of motion. Neck supple. No JVD present. Carotid bruit is not present.  Cardiovascular: Normal rate, regular rhythm and normal heart sounds.   Pulmonary/Chest: Effort normal and breath sounds normal.  Abdominal: Normal appearance. There is no splenomegaly or hepatomegaly.  Musculoskeletal: Normal range of motion.  Neurological: She is alert and oriented to person, place, and time.  Skin: Skin is warm, dry and intact. No rash noted. No pallor.  Psychiatric: She has a normal mood and affect. Her behavior is normal. Judgment and thought content normal.    Results for orders placed or performed in visit on 05/23/16  Comprehensive metabolic panel  Result Value Ref Range   Glucose 115 (H) 65 - 99 mg/dL   BUN 17 8 - 27 mg/dL   Creatinine, Ser 0.81 0.57 - 1.00 mg/dL   GFR calc non Af Amer 75 >59 mL/min/1.73   GFR calc Af Amer 87 >59 mL/min/1.73  BUN/Creatinine Ratio 21 12 - 28   Sodium 142 134 - 144 mmol/L   Potassium 4.2 3.5 - 5.2 mmol/L   Chloride 101 96 - 106 mmol/L   CO2 23 18 - 29 mmol/L   Calcium 9.9 8.7 - 10.3 mg/dL   Total Protein 6.8 6.0 - 8.5 g/dL   Albumin 4.2 3.6 - 4.8 g/dL   Globulin, Total 2.6 1.5 - 4.5 g/dL   Albumin/Globulin Ratio 1.6 1.2 - 2.2   Bilirubin Total 0.4 0.0 - 1.2 mg/dL   Alkaline Phosphatase 102 39 - 117 IU/L   AST 16 0 - 40 IU/L   ALT 12 0 - 32 IU/L  Lipid Panel w/o Chol/HDL Ratio  Result Value Ref Range   Cholesterol, Total 212 (H) 100 - 199 mg/dL   Triglycerides 112 0 - 149 mg/dL   HDL 59 >39 mg/dL   VLDL Cholesterol Cal 22 5 - 40 mg/dL   LDL Calculated 131 (H) 0 - 99 mg/dL  Bayer DCA Hb A1c Waived  Result Value Ref Range   Bayer DCA Hb A1c Waived 5.5 <7.0 %      Assessment & Plan:   Problem List Items  Addressed This Visit      Unprioritized   BP (high blood pressure)    High today but good numbers at home.  Continue present      Relevant Medications   valsartan (DIOVAN) 160 MG tablet   Other Relevant Orders   Comprehensive metabolic panel   Hyperlipidemia    Reviewed ASCVD calculator.  Risk score 11.9% Refusing statins.  She has lost weight.        Relevant Medications   valsartan (DIOVAN) 160 MG tablet   Other Relevant Orders   Lipid Panel w/o Chol/HDL Ratio       Follow up plan: Return in about 6 months (around 05/24/2017) for PE.

## 2016-11-21 NOTE — Assessment & Plan Note (Signed)
High today but good numbers at home.  Continue present

## 2016-11-22 LAB — COMPREHENSIVE METABOLIC PANEL
ALK PHOS: 104 IU/L (ref 39–117)
ALT: 13 IU/L (ref 0–32)
AST: 17 IU/L (ref 0–40)
Albumin/Globulin Ratio: 1.8 (ref 1.2–2.2)
Albumin: 4.2 g/dL (ref 3.6–4.8)
BUN/Creatinine Ratio: 20 (ref 12–28)
BUN: 16 mg/dL (ref 8–27)
Bilirubin Total: 0.3 mg/dL (ref 0.0–1.2)
CALCIUM: 9.8 mg/dL (ref 8.7–10.3)
CO2: 24 mmol/L (ref 20–29)
CREATININE: 0.79 mg/dL (ref 0.57–1.00)
Chloride: 103 mmol/L (ref 96–106)
GFR calc Af Amer: 90 mL/min/{1.73_m2} (ref >59)
GFR, EST NON AFRICAN AMERICAN: 78 mL/min/{1.73_m2} (ref >59)
GLUCOSE: 108 mg/dL — AB (ref 65–99)
Globulin, Total: 2.3 g/dL (ref 1.5–4.5)
Potassium: 4.4 mmol/L (ref 3.5–5.2)
SODIUM: 141 mmol/L (ref 134–144)
Total Protein: 6.5 g/dL (ref 6.0–8.5)

## 2016-11-22 LAB — LIPID PANEL W/O CHOL/HDL RATIO
Cholesterol, Total: 199 mg/dL (ref 100–199)
HDL: 58 mg/dL (ref >39)
LDL Calculated: 124 mg/dL — ABNORMAL HIGH (ref 0–99)
Triglycerides: 87 mg/dL (ref 0–149)
VLDL Cholesterol Cal: 17 mg/dL (ref 5–40)

## 2016-11-23 ENCOUNTER — Encounter: Payer: Self-pay | Admitting: Unknown Physician Specialty

## 2016-12-31 ENCOUNTER — Ambulatory Visit (INDEPENDENT_AMBULATORY_CARE_PROVIDER_SITE_OTHER): Payer: Medicare Other | Admitting: Family Medicine

## 2016-12-31 ENCOUNTER — Encounter: Payer: Self-pay | Admitting: Family Medicine

## 2016-12-31 VITALS — BP 121/73 | HR 74 | Temp 98.7°F | Wt 201.2 lb

## 2016-12-31 DIAGNOSIS — J069 Acute upper respiratory infection, unspecified: Secondary | ICD-10-CM

## 2016-12-31 MED ORDER — BENZONATATE 200 MG PO CAPS: 200.0000 mg | ORAL_CAPSULE | Freq: Two times a day (BID) | ORAL | 0 refills | Status: DC | PRN

## 2016-12-31 MED ORDER — AZITHROMYCIN 250 MG PO TABS: ORAL_TABLET | ORAL | 0 refills | Status: DC

## 2016-12-31 MED ORDER — HYDROCOD POLST-CPM POLST ER 10-8 MG/5ML PO SUER: 5.0000 mL | Freq: Two times a day (BID) | ORAL | 0 refills | Status: DC | PRN

## 2016-12-31 NOTE — Progress Notes (Signed)
   BP 121/73 (BP Location: Left Arm, Patient Position: Sitting, Cuff Size: Large)   Pulse 74   Temp 98.7 F (37.1 C)   Wt 201 lb 3.2 oz (91.3 kg)   SpO2 96%   BMI 32.47 kg/m    Subjective:    Patient ID: Melissa Merritt, female    DOB: Nov 07, 1950, 66 y.o.   MRN: [id number]  HPI: Melissa Merritt is a 66 y.o. female  Chief Complaint  Patient presents with  . Cough    x's 11 days. Productive. Yellow/Green phlegm. Chest hurts when coughing.  Marland Kitchen Headache   Patient presents with productive cough, congestion, chest tenderness, wheezing x 1.5 weeks. Denies fever, chills,sweats, N/V/D. Taking alka seltzer with minimal relief. Twin grandchildren were both sick recently.   Relevant past medical, surgical, family and social history reviewed and updated as indicated. Interim medical history since our last visit reviewed. Allergies and medications reviewed and updated.  Review of Systems  Constitutional: Negative.   HENT: Positive for congestion.   Respiratory: Positive for cough, chest tightness and wheezing.   Cardiovascular: Negative.   Gastrointestinal: Negative.   Musculoskeletal: Negative.   Neurological: Negative.   Psychiatric/Behavioral: Negative.    Per HPI unless specifically indicated above     Objective:    BP 121/73 (BP Location: Left Arm, Patient Position: Sitting, Cuff Size: Large)   Pulse 74   Temp 98.7 F (37.1 C)   Wt 201 lb 3.2 oz (91.3 kg)   SpO2 96%   BMI 32.47 kg/m   Wt Readings from Last 3 Encounters:  12/31/16 201 lb 3.2 oz (91.3 kg)  11/21/16 210 lb 8 oz (95.5 kg)  05/23/16 217 lb 6.4 oz (98.6 kg)    Physical Exam  Constitutional: She is oriented to person, place, and time. She appears well-developed and well-nourished. No distress.  HENT:  Head: Atraumatic.  Right Ear: External ear normal.  Left Ear: External ear normal.  Oropharynx erythematous  Eyes: Pupils are equal, round, and reactive to light. Conjunctivae are normal.    Neck: Normal range of motion. Neck supple.  Cardiovascular: Normal rate and normal heart sounds.   Pulmonary/Chest: Effort normal. No respiratory distress. She has wheezes.  Musculoskeletal: Normal range of motion.  Neurological: She is alert and oriented to person, place, and time.  Skin: Skin is warm and dry.  Psychiatric: She has a normal mood and affect. Her behavior is normal.  Nursing note and vitals reviewed.     Assessment & Plan:   Problem List Items Addressed This Visit    None    Visit Diagnoses    Upper respiratory tract infection, unspecified type    -  Primary   Will treat with zpack, tessalon perles, and tussionex. Sedation precautions reviewed with tussionex. Supportive care discussed. F/u if no improvement   Relevant Medications   azithromycin (ZITHROMAX) 250 MG tablet       Follow up plan: Return for as scheduled.

## 2017-01-03 NOTE — Patient Instructions (Signed)
Follow up as needed

## 2017-01-04 ENCOUNTER — Ambulatory Visit (INDEPENDENT_AMBULATORY_CARE_PROVIDER_SITE_OTHER): Payer: Medicare Other

## 2017-01-04 ENCOUNTER — Ambulatory Visit (INDEPENDENT_AMBULATORY_CARE_PROVIDER_SITE_OTHER): Payer: Self-pay | Admitting: Vascular Surgery

## 2017-01-04 ENCOUNTER — Other Ambulatory Visit (INDEPENDENT_AMBULATORY_CARE_PROVIDER_SITE_OTHER): Payer: Self-pay | Admitting: Vascular Surgery

## 2017-01-04 DIAGNOSIS — I6523 Occlusion and stenosis of bilateral carotid arteries: Secondary | ICD-10-CM

## 2017-01-21 ENCOUNTER — Other Ambulatory Visit: Payer: Self-pay | Admitting: Unknown Physician Specialty

## 2017-01-23 ENCOUNTER — Encounter (INDEPENDENT_AMBULATORY_CARE_PROVIDER_SITE_OTHER): Payer: Self-pay | Admitting: Vascular Surgery

## 2017-02-20 DIAGNOSIS — Z23 Encounter for immunization: Secondary | ICD-10-CM | POA: Diagnosis not present

## 2017-03-21 DIAGNOSIS — I519 Heart disease, unspecified: Secondary | ICD-10-CM | POA: Diagnosis not present

## 2017-03-21 DIAGNOSIS — I10 Essential (primary) hypertension: Secondary | ICD-10-CM | POA: Diagnosis not present

## 2017-04-22 ENCOUNTER — Other Ambulatory Visit: Payer: Self-pay | Admitting: Unknown Physician Specialty

## 2017-05-17 ENCOUNTER — Ambulatory Visit (INDEPENDENT_AMBULATORY_CARE_PROVIDER_SITE_OTHER): Payer: Medicare Other

## 2017-05-17 VITALS — BP 146/85 | HR 69 | Temp 98.1°F | Resp 16 | Ht 65.0 in | Wt 218.6 lb

## 2017-05-17 DIAGNOSIS — Z0000 Encounter for general adult medical examination without abnormal findings: Secondary | ICD-10-CM

## 2017-05-17 NOTE — Patient Instructions (Signed)
[name] , Thank you for taking time to come for your Medicare Wellness Visit. I appreciate your ongoing commitment to your health goals. Please review the following plan we discussed and let me know if I can assist you in the future.   Screening recommendations/referrals: Colonoscopy: completed 10/19/2013 Mammogram: completed 04/11/2016 Bone Density: completed 03/24/2014 Recommended yearly ophthalmology/optometry visit for glaucoma screening and checkup Recommended yearly dental visit for hygiene and checkup  Vaccinations: Influenza vaccine: up to date  Pneumococcal vaccine: up to date  Tdap vaccine: up to date  Shingles vaccine: up to date   Advanced directives: Please bring a copy of your health care power of attorney and living will to the office at your convenience.  Conditions/risks identified: Recommend drinking at least 6-8 glasses of water a day   Next appointment: Follow up on 05/27/2017 at 10:00am with Regino Schultze. Follow up in one year for your annual wellness exam.    Preventive Care 65 Years and Older, Female Preventive care refers to lifestyle choices and visits with your health care provider that can promote health and wellness. What does preventive care include?  A yearly physical exam. This is also called an annual well check.  Dental exams once or twice a year.  Routine eye exams. Ask your health care provider how often you should have your eyes checked.  Personal lifestyle choices, including:  Daily care of your teeth and gums.  Regular physical activity.  Eating a healthy diet.  Avoiding tobacco and drug use.  Limiting alcohol use.  Practicing safe sex.  Taking low-dose aspirin every day.  Taking vitamin and mineral supplements as recommended by your health care provider. What happens during an annual well check? The services and screenings done by your health care provider during your annual well check will depend on your age, overall  health, lifestyle risk factors, and family history of disease. Counseling  Your health care provider may ask you questions about your:  Alcohol use.  Tobacco use.  Drug use.  Emotional well-being.  Home and relationship well-being.  Sexual activity.  Eating habits.  History of falls.  Memory and ability to understand (cognition).  Work and work Statistician.  Reproductive health. Screening  You may have the following tests or measurements:  Height, weight, and BMI.  Blood pressure.  Lipid and cholesterol levels. These may be checked every 5 years, or more frequently if you are over 2 years old.  Skin check.  Lung cancer screening. You may have this screening every year starting at age 78 if you have a 30-pack-year history of smoking and currently smoke or have quit within the past 15 years.  Fecal occult blood test (FOBT) of the stool. You may have this test every year starting at age 24.  Flexible sigmoidoscopy or colonoscopy. You may have a sigmoidoscopy every 5 years or a colonoscopy every 10 years starting at age 5.  Hepatitis C blood test.  Hepatitis B blood test.  Sexually transmitted disease (STD) testing.  Diabetes screening. This is done by checking your blood sugar (glucose) after you have not eaten for a while (fasting). You may have this done every 1-3 years.  Bone density scan. This is done to screen for osteoporosis. You may have this done starting at age 28.  Mammogram. This may be done every 1-2 years. Talk to your health care provider about how often you should have regular mammograms. Talk with your health care provider about your test results, treatment options, and if  necessary, the need for more tests. Vaccines  Your health care provider may recommend certain vaccines, such as:  Influenza vaccine. This is recommended every year.  Tetanus, diphtheria, and acellular pertussis (Tdap, Td) vaccine. You may need a Td booster every 10  years.  Zoster vaccine. You may need this after age 20.  Pneumococcal 13-valent conjugate (PCV13) vaccine. One dose is recommended after age 62.  Pneumococcal polysaccharide (PPSV23) vaccine. One dose is recommended after age 70. Talk to your health care provider about which screenings and vaccines you need and how often you need them. This information is not intended to replace advice given to you by your health care provider. Make sure you discuss any questions you have with your health care provider. Document Released: 04/22/2015 Document Revised: 12/14/2015 Document Reviewed: 01/25/2015 Elsevier Interactive Patient Education  2017 [place] Prevention in the Home Falls can cause injuries. They can happen to people of all ages. There are many things you can do to make your home safe and to help prevent falls. What can I do on the outside of my home?  Regularly fix the edges of walkways and driveways and fix any cracks.  Remove anything that might make you trip as you walk through a door, such as a raised step or threshold.  Trim any bushes or trees on the path to your home.  Use bright outdoor lighting.  Clear any walking paths of anything that might make someone trip, such as rocks or tools.  Regularly check to see if handrails are loose or broken. Make sure that both sides of any steps have handrails.  Any raised decks and porches should have guardrails on the edges.  Have any leaves, snow, or ice cleared regularly.  Use sand or salt on walking paths during winter.  Clean up any spills in your garage right away. This includes oil or grease spills. What can I do in the bathroom?  Use night lights.  Install grab bars by the toilet and in the tub and shower. Do not use towel bars as grab bars.  Use non-skid mats or decals in the tub or shower.  If you need to sit down in the shower, use a plastic, non-slip stool.  Keep the floor dry. Clean up any water that  spills on the floor as soon as it happens.  Remove soap buildup in the tub or shower regularly.  Attach bath mats securely with double-sided non-slip rug tape.  Do not have throw rugs and other things on the floor that can make you trip. What can I do in the bedroom?  Use night lights.  Make sure that you have a light by your bed that is easy to reach.  Do not use any sheets or blankets that are too big for your bed. They should not hang down onto the floor.  Have a firm chair that has side arms. You can use this for support while you get dressed.  Do not have throw rugs and other things on the floor that can make you trip. What can I do in the kitchen?  Clean up any spills right away.  Avoid walking on wet floors.  Keep items that you use a lot in easy-to-reach places.  If you need to reach something above you, use a strong step stool that has a grab bar.  Keep electrical cords out of the way.  Do not use floor polish or wax that makes floors slippery. If you must  use wax, use non-skid floor wax.  Do not have throw rugs and other things on the floor that can make you trip. What can I do with my stairs?  Do not leave any items on the stairs.  Make sure that there are handrails on both sides of the stairs and use them. Fix handrails that are broken or loose. Make sure that handrails are as long as the stairways.  Check any carpeting to make sure that it is firmly attached to the stairs. Fix any carpet that is loose or worn.  Avoid having throw rugs at the top or bottom of the stairs. If you do have throw rugs, attach them to the floor with carpet tape.  Make sure that you have a light switch at the top of the stairs and the bottom of the stairs. If you do not have them, ask someone to add them for you. What else can I do to help prevent falls?  Wear shoes that:  Do not have high heels.  Have rubber bottoms.  Are comfortable and fit you well.  Are closed at the  toe. Do not wear sandals.  If you use a stepladder:  Make sure that it is fully opened. Do not climb a closed stepladder.  Make sure that both sides of the stepladder are locked into place.  Ask someone to hold it for you, if possible.  Clearly mark and make sure that you can see:  Any grab bars or handrails.  First and last steps.  Where the edge of each step is.  Use tools that help you move around (mobility aids) if they are needed. These include:  Canes.  Walkers.  Scooters.  Crutches.  Turn on the lights when you go into a dark area. Replace any light bulbs as soon as they burn out.  Set up your furniture so you have a clear path. Avoid moving your furniture around.  If any of your floors are uneven, fix them.  If there are any pets around you, be aware of where they are.  Review your medicines with your doctor. Some medicines can make you feel dizzy. This can increase your chance of falling. Ask your doctor what other things that you can do to help prevent falls. This information is not intended to replace advice given to you by your health care provider. Make sure you discuss any questions you have with your health care provider. Document Released: 01/20/2009 Document Revised: 09/01/2015 Document Reviewed: 04/30/2014 Elsevier Interactive Patient Education  2017 [employer].

## 2017-05-17 NOTE — Progress Notes (Signed)
Subjective:   Melissa Merritt is a 67 y.o. female who presents for an Initial Medicare Annual Wellness Visit.  Review of Systems      Cardiac Risk Factors include: hypertension;advanced age (>58men, >29 women);obesity (BMI >30kg/m2);dyslipidemia     Objective:    Today's Vitals   05/17/17 0858  BP: (!) 146/85  Pulse: 69  Resp: 16  Temp: 98.1 F (36.7 C)  TempSrc: Oral  Weight: 218 lb 9.6 oz (99.2 kg)  Height: 5\' 5"  (1.651 m)   Body mass index is 36.38 kg/m.  Advanced Directives 05/17/2017 05/23/2016 05/23/2015 05/23/2015  Does Patient Have a Medical Advance Directive? Yes No;Yes Yes Yes  Type of Paramedic of [place];Living will [place];Living will [place];Living will [place];Living will  Does patient want to make changes to medical advance directive? - - No - Patient declined -  Copy of [place] in Chart? No - copy requested - No - copy requested -    Current Medications (verified) Outpatient Encounter Medications as of 05/17/2017  Medication Sig  . amLODipine (NORVASC) 5 MG tablet Take 1 tablet (5 mg total) by mouth daily.  Marland Kitchen aspirin 81 MG tablet Take 81 mg by mouth daily.  . Biotin 5000 MCG TABS Take 5,000 mcg by mouth 2 (two) times daily.  . Calcium Carb-Cholecalciferol (CALCIUM-VITAMIN D) 600-400 MG-UNIT TABS Take 1 tablet by mouth 2 (two) times daily.  . Cyanocobalamin (B-12) 2500 MCG TABS Take 2,500 mcg by mouth daily.  . metoprolol succinate (TOPROL-XL) 25 MG 24 hr tablet Take 1 tablet (25 mg total) by mouth daily.  . Omega-3 Fatty Acids (FISH OIL PO) Take by mouth.  . pantoprazole (PROTONIX) 40 MG tablet Take 1 tablet (40 mg total) by mouth 2 (two) times daily.  . [place] Wort 300 MG CAPS Take 300 mg by mouth 2 (two) times daily.  . valsartan (DIOVAN) 160 MG tablet Take 1 tablet (160 mg total) by mouth daily.  Marland Kitchen erythromycin [facility]) ophthalmic  ointment Place 1 application into the right eye at bedtime. (Patient not taking: Reported on 05/17/2017)  . [DISCONTINUED] azithromycin (ZITHROMAX) 250 MG tablet Take 2 tabs day one, then 1 tab daily until complete (Patient not taking: Reported on 05/17/2017)  . [DISCONTINUED] benzonatate (TESSALON) 200 MG capsule Take 1 capsule (200 mg total) by mouth 2 (two) times daily as needed for cough. (Patient not taking: Reported on 05/17/2017)  . [DISCONTINUED] chlorpheniramine-HYDROcodone (TUSSIONEX PENNKINETIC ER) 10-8 MG/5ML SUER Take 5 mLs by mouth every 12 (twelve) hours as needed for cough. (Patient not taking: Reported on 05/17/2017)   No facility-administered encounter medications on file as of 05/17/2017.     Allergies (verified) Ace inhibitors; Contrast media [iodinated diagnostic agents]; Hctz [hydrochlorothiazide]; and Keflex [cephalexin]   History: Past Medical History:  Diagnosis Date  . Benign tumor of lung   . CHF (congestive heart failure) ([place])   . GERD (gastroesophageal reflux disease)   . Hyperlipidemia   . Hypertension   . Insomnia   . Osteopenia   . Plantar fasciitis   . S/P pneumonectomy    Past Surgical History:  Procedure Laterality Date  . bronchoscopy    . CATARACT EXTRACTION, BILATERAL     Left- 11/21/15, right- 12/23/15  . CHOLECYSTECTOMY    . CYSTECTOMY  11/03/09   cheek  . EYE SURGERY    . FOOT SURGERY Bilateral 2010  . LOBECTOMY    . LUNG  SURGERY Right   . UPPER GI ENDOSCOPY  2005   Family History  Problem Relation Age of Onset  . COPD Mother   . Hypertension Mother   . Emphysema Mother   . Osteoporosis Mother   . Heart disease Father   . Hyperlipidemia Sister   . Hyperlipidemia Brother   . Heart disease Maternal Grandmother   . Heart disease Maternal Grandfather   . Heart disease Paternal Grandmother   . Stroke Paternal Grandmother   . Heart disease Paternal Grandfather   . Stroke Paternal Grandfather   . Cancer Maternal Aunt        breast and lung    . Osteoporosis Maternal Aunt   . Aortic aneurysm Maternal Aunt   . Breast cancer Maternal Aunt 76   Social History   Socioeconomic History  . Marital status: Married    Spouse name: None  . Number of children: None  . Years of education: None  . Highest education level: None  Social Needs  . Financial resource strain: Not hard at all  . Food insecurity - worry: Never true  . Food insecurity - inability: Never true  . Transportation needs - medical: No  . Transportation needs - non-medical: No  Occupational History  . None  Tobacco Use  . Smoking status: Former Smoker    Last attempt to quit: 07/09/1979    Years since quitting: 37.8  . Smokeless tobacco: Never Used  Substance and Sexual Activity  . Alcohol use: No  . Drug use: No  . Sexual activity: Yes  Other Topics Concern  . None  Social History Narrative  . None    Tobacco Counseling Counseling given: Not Answered   Clinical Intake:  Pre-visit preparation completed: Yes  Pain : No/denies pain     Nutritional Status: BMI > 30  Obese Nutritional Risks: None Diabetes: No  How often do you need to have someone help you when you read instructions, pamphlets, or other written materials from your doctor or pharmacy?: 1 - Never What is the last grade level you completed in school?: some college   Interpreter Needed?: No  Information entered by :: Tiffany Hill,LPN    Activities of Daily Living In your present state of health, do you have any difficulty performing the following activities: 05/17/2017 05/23/2016  Hearing? N N  Vision? N N  Difficulty concentrating or making decisions? N N  Walking or climbing stairs? N Y  Dressing or bathing? N N  Doing errands, shopping? N N  Preparing Food and eating ? N -  Using the Toilet? N -  In the past six months, have you accidently leaked urine? N -  Do you have problems with loss of bowel control? N -  Managing your Medications? N -  Managing your Finances? N -   Housekeeping or managing your Housekeeping? N -  Some recent data might be hidden     Immunizations and Health Maintenance Immunization History  Administered Date(s) Administered  . Influenza, High Dose Seasonal PF 12/27/2015  . Influenza,inj,Quad PF,6+ Mos 01/21/2015  . Influenza-Unspecified 02/08/2017  . Pneumococcal Conjugate-13 05/19/2014  . Pneumococcal Polysaccharide-23 01/14/2008, 05/23/2015  . Td 09/11/2005  . Tdap 12/27/2015  . Zoster 09/04/2011   There are no preventive care reminders to display for this patient.  Patient Care Team: Kathrine Haddock, NP as PCP - General (Nurse Practitioner) Idelle Leech, OD (Optometry) Isaias Cowman, MD as Consulting Physician (Cardiology) Manya Silvas, MD (Gastroenterology) Algernon Huxley,  MD as Referring Physician (Vascular Surgery) Benjaman Kindler, MD as Consulting Physician (Obstetrics and Gynecology)  Indicate any recent Medical Services you may have received from other than [facility] providers in the past year (date may be approximate).     Assessment:   This is a routine wellness examination for Melissa Merritt.  Hearing/Vision screen Vision Screening Comments: Sees Dr.Nice annually   Dietary issues and exercise activities discussed: Current Exercise Habits: The patient does not participate in regular exercise at present, Exercise limited by: None identified  Goals    . DIET - INCREASE WATER INTAKE     Recommend drinking at least 6-8 glasses of water a day       Depression Screen PHQ 2/9 Scores 05/17/2017 05/23/2016 05/23/2015  PHQ - 2 Score 0 0 0  PHQ- 9 Score - 1 -    Fall Risk Fall Risk  05/17/2017 05/23/2016 05/23/2015  Falls in the past year? Yes Yes Yes  Number falls in past yr: 1 2 or more 1  Injury with Fall? No No No    Is the patient's home free of loose throw rugs in walkways, pet beds, electrical cords, etc?   Yes       Grab bars in the bathroom? no      Handrails on the stairs?   yes      Adequate  lighting?   yes  Timed Get Up and Go Performed Completed in 8 seconds with no use of assistive devices, steady gait. No intervention needed at this time.   Cognitive Function:     6CIT Screen 05/17/2017  What Year? 0 points  What month? 0 points  What time? 0 points  Count back from 20 0 points  Months in reverse 0 points  Repeat phrase 0 points  Total Score 0    Screening Tests Health Maintenance  Topic Date Due  . MAMMOGRAM  04/11/2018  . COLONOSCOPY  10/20/2023  . TETANUS/TDAP  12/26/2025  . INFLUENZA VACCINE  Completed  . DEXA SCAN  Completed  . Hepatitis C Screening  Completed  . PNA vac Low Risk Adult  Completed    Qualifies for Shingles Vaccine? Yes, shingrix vaccine discussed  Cancer Screenings: Lung: Low Dose CT Chest recommended if Age 55-80 years, 30 pack-year currently smoking OR have quit w/in 15years. Patient does not qualify. Breast: Up to date on Mammogram? Yes  04/11/2016 Up to date of Bone Density/Dexa? Yes 03/24/2014 Colorectal: completed 10/19/2013  Additional Screenings:  Hepatitis B/HIV/Syphillis: not indicated Hepatitis C Screening: completed 12/27/2015     Plan:    I have personally reviewed and addressed the Medicare Annual Wellness questionnaire and have noted the following in the patient's chart:  A. Medical and social history B. Use of alcohol, tobacco or illicit drugs  C. Current medications and supplements D. Functional ability and status E.  Nutritional status F.  Physical activity G. Advance directives H. List of other physicians I.  Hospitalizations, surgeries, and ER visits in previous 12 months J.  [place] such as hearing and vision if needed, cognitive and depression L. Referrals and appointments   In addition, I have reviewed and discussed with patient certain preventive protocols, quality metrics, and best practice recommendations. A written personalized care plan for preventive services as well as general  preventive health recommendations were provided to patient.   Signed,  Tyler Aas, LPN Nurse Health Advisor   Nurse Notes: none

## 2017-05-20 DIAGNOSIS — D485 Neoplasm of uncertain behavior of skin: Secondary | ICD-10-CM | POA: Diagnosis not present

## 2017-05-20 DIAGNOSIS — C4442 Squamous cell carcinoma of skin of scalp and neck: Secondary | ICD-10-CM | POA: Diagnosis not present

## 2017-05-20 DIAGNOSIS — D3611 Benign neoplasm of peripheral nerves and autonomic nervous system of face, head, and neck: Secondary | ICD-10-CM | POA: Diagnosis not present

## 2017-05-27 ENCOUNTER — Encounter: Payer: Self-pay | Admitting: Unknown Physician Specialty

## 2017-05-27 ENCOUNTER — Ambulatory Visit (INDEPENDENT_AMBULATORY_CARE_PROVIDER_SITE_OTHER): Payer: Medicare Other | Admitting: Unknown Physician Specialty

## 2017-05-27 VITALS — BP 133/84 | HR 69 | Temp 98.2°F | Ht 65.0 in | Wt 219.5 lb

## 2017-05-27 DIAGNOSIS — E782 Mixed hyperlipidemia: Secondary | ICD-10-CM | POA: Diagnosis not present

## 2017-05-27 DIAGNOSIS — K219 Gastro-esophageal reflux disease without esophagitis: Secondary | ICD-10-CM | POA: Diagnosis not present

## 2017-05-27 DIAGNOSIS — I10 Essential (primary) hypertension: Secondary | ICD-10-CM

## 2017-05-27 DIAGNOSIS — G4733 Obstructive sleep apnea (adult) (pediatric): Secondary | ICD-10-CM | POA: Diagnosis not present

## 2017-05-27 DIAGNOSIS — D1430 Benign neoplasm of unspecified bronchus and lung: Secondary | ICD-10-CM | POA: Diagnosis not present

## 2017-05-27 NOTE — Progress Notes (Signed)
BP 133/84   Pulse 69   Temp 98.2 F (36.8 C) (Oral)   Ht 5\' 5"  (1.651 m)   Wt 219 lb 8 oz (99.6 kg)   SpO2 96%   BMI 36.53 kg/m    Subjective:    Patient ID: Melissa Merritt, female    DOB: 04/26/1950, 67 y.o.   MRN: [id number]  HPI: Melissa Merritt is a 67 y.o. female  Chief Complaint  Patient presents with  . Hyperlipidemia  . Hypertension   Hypertension Using medications without difficulty Average home BPs Not checking   No problems or lightheadedness No chest pain with exertion or shortness of breath No Edema  Hyperlipidemia Using medications without problems: No Muscle aches  Diet compliance:Exercise: Exercising by climbing steps and cares for grandchildren.  Diet - watching what she eats and could eat better.    Sleep apnea Unable to use CPAP due to panic attacks.  States she often sleeps during the day  GERD Doing well with Protonis  Relevant past medical, surgical, family and social history reviewed and updated as indicated. Interim medical history since our last visit reviewed. Allergies and medications reviewed and updated.  Review of Systems  Constitutional: Negative.   HENT: Negative.   Eyes: Negative.   Respiratory: Negative.   Cardiovascular: Negative.   Gastrointestinal: Negative.   Endocrine: Negative.   Genitourinary: Negative.   Musculoskeletal: Negative.   Skin: Negative.   Allergic/Immunologic: Negative.   Neurological: Negative.   Hematological: Negative.   Psychiatric/Behavioral: Negative.    Per HPI unless specifically indicated above     Objective:    BP 133/84   Pulse 69   Temp 98.2 F (36.8 C) (Oral)   Ht 5\' 5"  (1.651 m)   Wt 219 lb 8 oz (99.6 kg)   SpO2 96%   BMI 36.53 kg/m   Wt Readings from Last 3 Encounters:  05/27/17 219 lb 8 oz (99.6 kg)  05/17/17 218 lb 9.6 oz (99.2 kg)  12/31/16 201 lb 3.2 oz (91.3 kg)    Physical Exam  Constitutional: She is oriented to person, place, and time. She  appears well-developed and well-nourished. No distress.  HENT:  Head: Normocephalic and atraumatic.  Eyes: Conjunctivae and lids are normal. Right eye exhibits no discharge. Left eye exhibits no discharge. No scleral icterus.  Neck: Normal range of motion. Neck supple. No JVD present. Carotid bruit is not present.  Cardiovascular: Normal rate, regular rhythm and normal heart sounds.  Pulmonary/Chest: Effort normal and breath sounds normal.  Abdominal: Normal appearance. There is no splenomegaly or hepatomegaly.  Musculoskeletal: Normal range of motion.  Neurological: She is alert and oriented to person, place, and time.  Skin: Skin is warm, dry and intact. No rash noted. No pallor.  Psychiatric: She has a normal mood and affect. Her behavior is normal. Judgment and thought content normal.    Results for orders placed or performed in visit on 11/21/16  Comprehensive metabolic panel  Result Value Ref Range   Glucose 108 (H) 65 - 99 mg/dL   BUN 16 8 - 27 mg/dL   Creatinine, Ser 0.79 0.57 - 1.00 mg/dL   GFR calc non Af Amer 78 >59 mL/min/1.73   GFR calc Af Amer 90 >59 mL/min/1.73   BUN/Creatinine Ratio 20 12 - 28   Sodium 141 134 - 144 mmol/L   Potassium 4.4 3.5 - 5.2 mmol/L   Chloride 103 96 - 106 mmol/L   CO2 24 20 - 29 mmol/L  Calcium 9.8 8.7 - 10.3 mg/dL   Total Protein 6.5 6.0 - 8.5 g/dL   Albumin 4.2 3.6 - 4.8 g/dL   Globulin, Total 2.3 1.5 - 4.5 g/dL   Albumin/Globulin Ratio 1.8 1.2 - 2.2   Bilirubin Total 0.3 0.0 - 1.2 mg/dL   Alkaline Phosphatase 104 39 - 117 IU/L   AST 17 0 - 40 IU/L   ALT 13 0 - 32 IU/L  Lipid Panel w/o Chol/HDL Ratio  Result Value Ref Range   Cholesterol, Total 199 100 - 199 mg/dL   Triglycerides 87 0 - 149 mg/dL   HDL 58 >39 mg/dL   VLDL Cholesterol Cal 17 5 - 40 mg/dL   LDL Calculated 124 (H) 0 - 99 mg/dL      Assessment & Plan:   Problem List Items Addressed This Visit      Unprioritized   Benign tumor of lung   Relevant Orders   DG  Chest 2 View   BP (high blood pressure)   Relevant Orders   Comprehensive metabolic panel   GERD (gastroesophageal reflux disease)    Stable, continue present medications.        Hyperlipidemia - Primary   Relevant Orders   Lipid Panel w/o Chol/HDL Ratio   Obstructive apnea    Untreated as can't use a CPAP due to panic attacks.  Refer to ENT for further managment          Follow up plan: Return in about 6 months (around 11/24/2017).

## 2017-05-27 NOTE — Assessment & Plan Note (Signed)
Untreated as can't use a CPAP due to panic attacks.  Refer to ENT for further managment

## 2017-05-27 NOTE — Assessment & Plan Note (Signed)
Stable, continue present medications.   

## 2017-05-28 ENCOUNTER — Encounter: Payer: Self-pay | Admitting: Unknown Physician Specialty

## 2017-05-28 LAB — COMPREHENSIVE METABOLIC PANEL
A/G RATIO: 1.9 (ref 1.2–2.2)
ALT: 33 IU/L — AB (ref 0–32)
AST: 22 IU/L (ref 0–40)
Albumin: 4.3 g/dL (ref 3.6–4.8)
Alkaline Phosphatase: 109 IU/L (ref 39–117)
BUN/Creatinine Ratio: 19 (ref 12–28)
BUN: 15 mg/dL (ref 8–27)
Bilirubin Total: 0.3 mg/dL (ref 0.0–1.2)
CALCIUM: 9.5 mg/dL (ref 8.7–10.3)
CO2: 24 mmol/L (ref 20–29)
Chloride: 102 mmol/L (ref 96–106)
Creatinine, Ser: 0.77 mg/dL (ref 0.57–1.00)
GFR, EST AFRICAN AMERICAN: 92 mL/min/{1.73_m2} (ref >59)
GFR, EST NON AFRICAN AMERICAN: 80 mL/min/{1.73_m2} (ref >59)
Globulin, Total: 2.3 g/dL (ref 1.5–4.5)
Glucose: 105 mg/dL — ABNORMAL HIGH (ref 65–99)
Potassium: 4.6 mmol/L (ref 3.5–5.2)
Sodium: 141 mmol/L (ref 134–144)
TOTAL PROTEIN: 6.6 g/dL (ref 6.0–8.5)

## 2017-05-28 LAB — LIPID PANEL W/O CHOL/HDL RATIO
Cholesterol, Total: 209 mg/dL — ABNORMAL HIGH (ref 100–199)
HDL: 57 mg/dL (ref >39)
LDL Calculated: 125 mg/dL — ABNORMAL HIGH (ref 0–99)
Triglycerides: 137 mg/dL (ref 0–149)
VLDL CHOLESTEROL CAL: 27 mg/dL (ref 5–40)

## 2017-05-30 ENCOUNTER — Ambulatory Visit
Admission: RE | Admit: 2017-05-30 | Discharge: 2017-05-30 | Disposition: A | Discharge status: Home / Self Care | Payer: Medicare Other | Source: Ambulatory Visit | Attending: Unknown Physician Specialty | Admitting: Unknown Physician Specialty

## 2017-05-30 DIAGNOSIS — I700 Atherosclerosis of aorta: Secondary | ICD-10-CM | POA: Insufficient documentation

## 2017-05-30 DIAGNOSIS — D1431 Benign neoplasm of right bronchus and lung: Secondary | ICD-10-CM | POA: Diagnosis not present

## 2017-05-30 DIAGNOSIS — D1430 Benign neoplasm of unspecified bronchus and lung: Secondary | ICD-10-CM | POA: Insufficient documentation

## 2017-05-31 ENCOUNTER — Encounter: Payer: Self-pay | Admitting: Unknown Physician Specialty

## 2017-06-06 DIAGNOSIS — C4442 Squamous cell carcinoma of skin of scalp and neck: Secondary | ICD-10-CM | POA: Diagnosis not present

## 2017-06-20 ENCOUNTER — Encounter: Payer: Self-pay | Admitting: Family Medicine

## 2017-06-20 ENCOUNTER — Ambulatory Visit (INDEPENDENT_AMBULATORY_CARE_PROVIDER_SITE_OTHER): Payer: Medicare Other | Admitting: Family Medicine

## 2017-06-20 VITALS — BP 137/80 | HR 84 | Temp 98.7°F | Wt 214.8 lb

## 2017-06-20 DIAGNOSIS — J0100 Acute maxillary sinusitis, unspecified: Secondary | ICD-10-CM

## 2017-06-20 MED ORDER — AZITHROMYCIN 250 MG PO TABS: ORAL_TABLET | ORAL | 0 refills | Status: DC

## 2017-06-20 NOTE — Progress Notes (Signed)
BP 137/80   Pulse 84   Temp 98.7 F (37.1 C) (Oral)   Wt 214 lb 12.8 oz (97.4 kg)   SpO2 92%   BMI 35.74 kg/m    Subjective:    Patient ID: Melissa Merritt, female    DOB: 11/04/1950, 67 y.o.   MRN: [id number]  HPI: Melissa Merritt is a 67 y.o. female  Chief Complaint  Patient presents with  . URI    pt states she has had congestion, cough, and sinus pressure for about 10 days   Productive cough, congestion, facial pain and pressure, sore throat, ear pressure, headaches x 1.5 weeks. Things started improving but the past 3 days have gotten much worse. Now having sweats, chills, fatigue. Tried benadryl and elderberry but they made her feel worse (heart racing, BP up), tylenol. Several sick contacts.   Relevant past medical, surgical, family and social history reviewed and updated as indicated. Interim medical history since our last visit reviewed. Allergies and medications reviewed and updated.  Review of Systems  Per HPI unless specifically indicated above     Objective:    BP 137/80   Pulse 84   Temp 98.7 F (37.1 C) (Oral)   Wt 214 lb 12.8 oz (97.4 kg)   SpO2 92%   BMI 35.74 kg/m   Wt Readings from Last 3 Encounters:  06/20/17 214 lb 12.8 oz (97.4 kg)  05/27/17 219 lb 8 oz (99.6 kg)  05/17/17 218 lb 9.6 oz (99.2 kg)    Physical Exam  Constitutional: She is oriented to person, place, and time. She appears well-developed and well-nourished. No distress.  HENT:  Head: Atraumatic.  Right Ear: External ear normal.  Left Ear: External ear normal.  B/l maxillary sinuses ttp Nasal mucosa and oropharynx erythematous and edematous with thick drainage present  Eyes: Conjunctivae are normal. Pupils are equal, round, and reactive to light. No scleral icterus.  Neck: Normal range of motion. Neck supple.  Cardiovascular: Normal rate and normal heart sounds.  Pulmonary/Chest: Effort normal and breath sounds normal. No respiratory distress.    Musculoskeletal: Normal range of motion.  Neurological: She is alert and oriented to person, place, and time.  Skin: Skin is warm and dry.  Psychiatric: She has a normal mood and affect. Her behavior is normal.  Nursing note and vitals reviewed.   Results for orders placed or performed in visit on 05/27/17  Comprehensive metabolic panel  Result Value Ref Range   Glucose 105 (H) 65 - 99 mg/dL   BUN 15 8 - 27 mg/dL   Creatinine, Ser 0.77 0.57 - 1.00 mg/dL   GFR calc non Af Amer 80 >59 mL/min/1.73   GFR calc Af Amer 92 >59 mL/min/1.73   BUN/Creatinine Ratio 19 12 - 28   Sodium 141 134 - 144 mmol/L   Potassium 4.6 3.5 - 5.2 mmol/L   Chloride 102 96 - 106 mmol/L   CO2 24 20 - 29 mmol/L   Calcium 9.5 8.7 - 10.3 mg/dL   Total Protein 6.6 6.0 - 8.5 g/dL   Albumin 4.3 3.6 - 4.8 g/dL   Globulin, Total 2.3 1.5 - 4.5 g/dL   Albumin/Globulin Ratio 1.9 1.2 - 2.2   Bilirubin Total 0.3 0.0 - 1.2 mg/dL   Alkaline Phosphatase 109 39 - 117 IU/L   AST 22 0 - 40 IU/L   ALT 33 (H) 0 - 32 IU/L  Lipid Panel w/o Chol/HDL Ratio  Result Value Ref Range   Cholesterol, Total  209 (H) 100 - 199 mg/dL   Triglycerides 137 0 - 149 mg/dL   HDL 57 >39 mg/dL   VLDL Cholesterol Cal 27 5 - 40 mg/dL   LDL Calculated 125 (H) 0 - 99 mg/dL      Assessment & Plan:   Problem List Items Addressed This Visit    None    Visit Diagnoses    Acute maxillary sinusitis, recurrence not specified    -  Primary   Will tx with zpack, mucinex, sinus rinses, humidifier. Supportive care reviewed. F/u if worsening or no improvement   Relevant Medications   azithromycin (ZITHROMAX) 250 MG tablet       Follow up plan: Return for as scheduled.

## 2017-06-23 NOTE — Patient Instructions (Signed)
Follow up as needed

## 2017-06-27 DIAGNOSIS — H52222 Regular astigmatism, left eye: Secondary | ICD-10-CM | POA: Diagnosis not present

## 2017-06-27 DIAGNOSIS — H5211 Myopia, right eye: Secondary | ICD-10-CM | POA: Diagnosis not present

## 2017-06-27 DIAGNOSIS — H04123 Dry eye syndrome of bilateral lacrimal glands: Secondary | ICD-10-CM | POA: Diagnosis not present

## 2017-06-27 DIAGNOSIS — Z961 Presence of intraocular lens: Secondary | ICD-10-CM | POA: Diagnosis not present

## 2017-06-27 DIAGNOSIS — I10 Essential (primary) hypertension: Secondary | ICD-10-CM | POA: Diagnosis not present

## 2017-06-27 DIAGNOSIS — H43813 Vitreous degeneration, bilateral: Secondary | ICD-10-CM | POA: Diagnosis not present

## 2017-06-27 DIAGNOSIS — H02831 Dermatochalasis of right upper eyelid: Secondary | ICD-10-CM | POA: Diagnosis not present

## 2017-06-27 DIAGNOSIS — H35033 Hypertensive retinopathy, bilateral: Secondary | ICD-10-CM | POA: Diagnosis not present

## 2017-06-27 DIAGNOSIS — H02834 Dermatochalasis of left upper eyelid: Secondary | ICD-10-CM | POA: Diagnosis not present

## 2017-06-27 DIAGNOSIS — H1859 Other hereditary corneal dystrophies: Secondary | ICD-10-CM | POA: Diagnosis not present

## 2017-07-22 ENCOUNTER — Other Ambulatory Visit: Payer: Self-pay | Admitting: Unknown Physician Specialty

## 2017-09-18 DIAGNOSIS — G4733 Obstructive sleep apnea (adult) (pediatric): Secondary | ICD-10-CM | POA: Diagnosis not present

## 2017-09-18 DIAGNOSIS — R0602 Shortness of breath: Secondary | ICD-10-CM | POA: Diagnosis not present

## 2017-09-18 DIAGNOSIS — I10 Essential (primary) hypertension: Secondary | ICD-10-CM | POA: Diagnosis not present

## 2017-09-18 DIAGNOSIS — I519 Heart disease, unspecified: Secondary | ICD-10-CM | POA: Diagnosis not present

## 2017-10-25 ENCOUNTER — Encounter: Payer: Self-pay | Admitting: Unknown Physician Specialty

## 2017-10-29 ENCOUNTER — Other Ambulatory Visit: Payer: Self-pay | Admitting: Unknown Physician Specialty

## 2017-10-31 NOTE — Telephone Encounter (Signed)
Medication refills:  Valsartan; LRF 07/22/17; #90; no RF  Metoprolol; LRF 07/22/17; # 90; no RF  Pantoprazoole; LRF 07/22/17; # 180; no RF  Amlodipine; LRF 07/22/17; #90; no RF  Last OV 05/27/17  PCP: Kathrine Haddock  Medications auth. for refill per protocol

## 2017-11-25 ENCOUNTER — Ambulatory Visit: Payer: Medicare Other | Admitting: Unknown Physician Specialty

## 2017-11-28 ENCOUNTER — Encounter: Payer: Self-pay | Admitting: Physician Assistant

## 2017-11-28 ENCOUNTER — Ambulatory Visit (INDEPENDENT_AMBULATORY_CARE_PROVIDER_SITE_OTHER): Payer: Medicare Other | Admitting: Physician Assistant

## 2017-11-28 VITALS — BP 142/85 | HR 80 | Temp 98.2°F | Ht 65.0 in | Wt 224.2 lb

## 2017-11-28 DIAGNOSIS — E782 Mixed hyperlipidemia: Secondary | ICD-10-CM

## 2017-11-28 DIAGNOSIS — R739 Hyperglycemia, unspecified: Secondary | ICD-10-CM | POA: Diagnosis not present

## 2017-11-28 DIAGNOSIS — I10 Essential (primary) hypertension: Secondary | ICD-10-CM | POA: Diagnosis not present

## 2017-11-28 DIAGNOSIS — G4733 Obstructive sleep apnea (adult) (pediatric): Secondary | ICD-10-CM

## 2017-11-28 NOTE — Progress Notes (Signed)
Subjective:    Patient ID: Melissa Merritt, female    DOB: 06/09/1950, 67 y.o.   MRN: [id number]  Melissa Merritt is a 67 y.o. female presenting on 11/28/2017 for Hyperlipidemia and Hypertension   HPI   HTN: amlodipine 5 mg daily, metoprolol xL 25 mg daily, valsartan 160 mg daily.   BP Readings from Last 3 Encounters:  11/28/17 (!) 142/85  06/20/17 137/80  05/27/17 133/84   Hyperlipidemia: Elevated cholesterol, declines statin medications. She says she will never take these.  Lipid Panel     Component Value Date/Time   CHOL 209 (H) 05/27/2017 1116   TRIG 137 05/27/2017 1116   HDL 57 05/27/2017 1116   LDLCALC 125 (H) 05/27/2017 1116   The 10-year ASCVD risk score [name], et al., 2013) is: 12.5%   Values used to calculate the score:     Age: 54 years     Sex: Female     Is Non-Hispanic African American: No     Diabetic: No     Tobacco smoker: No     Systolic Blood Pressure: 915 mmHg     Is BP treated: Yes     HDL Cholesterol: 57 mg/dL     Total Cholesterol: 209 mg/dL  Sleep Apnea: Will not use CPAP for sleep apnea, drinks boost each morning for energy.   Social History   Tobacco Use  . Smoking status: Former Smoker    Last attempt to quit: 07/09/1979    Years since quitting: 38.4  . Smokeless tobacco: Never Used  Substance Use Topics  . Alcohol use: No  . Drug use: No    Review of Systems Per HPI unless specifically indicated above     Objective:    BP (!) 142/85   Pulse 80   Temp 98.2 F (36.8 C) (Oral)   Ht '5\' 5"'  (1.651 m)   Wt 224 lb 3.2 oz (101.7 kg)   SpO2 97%   BMI 37.31 kg/m   Wt Readings from Last 3 Encounters:  11/28/17 224 lb 3.2 oz (101.7 kg)  06/20/17 214 lb 12.8 oz (97.4 kg)  05/27/17 219 lb 8 oz (99.6 kg)    Physical Exam  Constitutional: She is oriented to person, place, and time. She appears well-developed and well-nourished.  Cardiovascular: Normal rate and regular rhythm.  Pulmonary/Chest: Effort normal  and breath sounds normal.  Neurological: She is alert and oriented to person, place, and time.  Skin: Skin is warm and dry.  Psychiatric: She has a normal mood and affect. Her behavior is normal.   Results for orders placed or performed in visit on 05/27/17  Comprehensive metabolic panel  Result Value Ref Range   Glucose 105 (H) 65 - 99 mg/dL   BUN 15 8 - 27 mg/dL   Creatinine, Ser 0.77 0.57 - 1.00 mg/dL   GFR calc non Af Amer 80 >59 mL/min/1.73   GFR calc Af Amer 92 >59 mL/min/1.73   BUN/Creatinine Ratio 19 12 - 28   Sodium 141 134 - 144 mmol/L   Potassium 4.6 3.5 - 5.2 mmol/L   Chloride 102 96 - 106 mmol/L   CO2 24 20 - 29 mmol/L   Calcium 9.5 8.7 - 10.3 mg/dL   Total Protein 6.6 6.0 - 8.5 g/dL   Albumin 4.3 3.6 - 4.8 g/dL   Globulin, Total 2.3 1.5 - 4.5 g/dL   Albumin/Globulin Ratio 1.9 1.2 - 2.2   Bilirubin Total 0.3 0.0 - 1.2 mg/dL  Alkaline Phosphatase 109 39 - 117 IU/L   AST 22 0 - 40 IU/L   ALT 33 (H) 0 - 32 IU/L  Lipid Panel w/o Chol/HDL Ratio  Result Value Ref Range   Cholesterol, Total 209 (H) 100 - 199 mg/dL   Triglycerides 137 0 - 149 mg/dL   HDL 57 >39 mg/dL   VLDL Cholesterol Cal 27 5 - 40 mg/dL   LDL Calculated 125 (H) 0 - 99 mg/dL      Assessment & Plan:  1. Essential hypertension  Slightly elevated today and in the past but patient does not wish to increase medications.  - Comp Met (CMET)  2. Hyperglycemia  Elevated blood sugars, will check A1c.   - HgB A1c  3. Obstructive apnea  Patient does not wear CPAP, says she has panic attacks and pulls it off her face. Does not want to ever wear this. Do think this is contributing to her lack of energy and increased blood pressure.   4. Mixed hyperlipidemia  CVD risk is 12.5%, she declines statin medications, though I do recommend these.    Follow up plan: Return in about 3 months (around 02/28/2018) for HTN, HLD.  Carles Collet, PA-C [facility] 11/28/2017, 12:03 PM

## 2017-11-28 NOTE — Patient Instructions (Signed)

## 2017-11-29 LAB — COMPREHENSIVE METABOLIC PANEL
ALT: 15 IU/L (ref 0–32)
AST: 14 IU/L (ref 0–40)
Albumin/Globulin Ratio: 1.8 (ref 1.2–2.2)
Albumin: 4.2 g/dL (ref 3.6–4.8)
Alkaline Phosphatase: 110 IU/L (ref 39–117)
BUN/Creatinine Ratio: 26 (ref 12–28)
BUN: 19 mg/dL (ref 8–27)
Bilirubin Total: <0.2 mg/dL (ref 0.0–1.2)
CO2: 22 mmol/L (ref 20–29)
Calcium: 9.5 mg/dL (ref 8.7–10.3)
Chloride: 105 mmol/L (ref 96–106)
Creatinine, Ser: 0.72 mg/dL (ref 0.57–1.00)
GFR calc Af Amer: 100 mL/min/{1.73_m2} (ref >59)
GFR calc non Af Amer: 86 mL/min/{1.73_m2} (ref >59)
Globulin, Total: 2.4 g/dL (ref 1.5–4.5)
Glucose: 122 mg/dL — ABNORMAL HIGH (ref 65–99)
Potassium: 4.3 mmol/L (ref 3.5–5.2)
Sodium: 143 mmol/L (ref 134–144)
Total Protein: 6.6 g/dL (ref 6.0–8.5)

## 2017-11-29 LAB — HEMOGLOBIN A1C
Est. average glucose Bld gHb Est-mCnc: 111 mg/dL
Hgb A1c MFr Bld: 5.5 % (ref 4.8–5.6)

## 2017-12-05 DIAGNOSIS — Z859 Personal history of malignant neoplasm, unspecified: Secondary | ICD-10-CM | POA: Diagnosis not present

## 2017-12-05 DIAGNOSIS — D1800 Hemangioma unspecified site: Secondary | ICD-10-CM | POA: Diagnosis not present

## 2017-12-05 DIAGNOSIS — Z1283 Encounter for screening for malignant neoplasm of skin: Secondary | ICD-10-CM | POA: Diagnosis not present

## 2017-12-05 DIAGNOSIS — L905 Scar conditions and fibrosis of skin: Secondary | ICD-10-CM | POA: Diagnosis not present

## 2017-12-05 DIAGNOSIS — L298 Other pruritus: Secondary | ICD-10-CM | POA: Diagnosis not present

## 2017-12-05 DIAGNOSIS — L578 Other skin changes due to chronic exposure to nonionizing radiation: Secondary | ICD-10-CM | POA: Diagnosis not present

## 2018-01-15 ENCOUNTER — Encounter: Payer: Self-pay | Admitting: Nurse Practitioner

## 2018-01-15 ENCOUNTER — Other Ambulatory Visit: Payer: Self-pay

## 2018-01-15 ENCOUNTER — Ambulatory Visit (INDEPENDENT_AMBULATORY_CARE_PROVIDER_SITE_OTHER): Payer: Medicare Other | Admitting: Nurse Practitioner

## 2018-01-15 VITALS — BP 128/81 | HR 78 | Temp 98.8°F | Ht 65.0 in | Wt 215.5 lb

## 2018-01-15 DIAGNOSIS — Z23 Encounter for immunization: Secondary | ICD-10-CM | POA: Diagnosis not present

## 2018-01-15 DIAGNOSIS — R197 Diarrhea, unspecified: Secondary | ICD-10-CM

## 2018-01-15 DIAGNOSIS — B09 Unspecified viral infection characterized by skin and mucous membrane lesions: Secondary | ICD-10-CM | POA: Diagnosis not present

## 2018-01-15 NOTE — Patient Instructions (Signed)

## 2018-01-15 NOTE — Assessment & Plan Note (Addendum)
Acute/improving.  Rash formation noted after viral symptoms dissipated.  May take OTC Claritin 10MG  PO QDAY as needed and use Aveeno Oatmeal soap for comfort.  Return to office if worsening rash or vesicles noted.

## 2018-01-15 NOTE — Progress Notes (Signed)
BP 128/81   Pulse 78   Temp 98.8 F (37.1 C) (Oral)   Ht '5\' 5"'  (1.651 m)   Wt 215 lb 8 oz (97.8 kg)   SpO2 95%   BMI 35.86 kg/m    Subjective:    Patient ID: Melissa Merritt, female    DOB: 1950-12-11, 67 y.o.   MRN: [id number]  HPI: Melissa Merritt is a 67 y.o. female  Chief Complaint  Patient presents with  . Rash    pt states has had rashes in her arms, back and abdomen x 1day  . Abdominal Pain    ongoing for about 2 weeks. states has had fever the first week  . Diarrhea    pt states unable to control her bladder   RASH: Rash began yesterday.  Initially noted on abdomen and then spread to neck area and left arm.  She reports mild pruritus at areas, "not terrible". The rash began showing after abdominal symptoms resolved.  She denies areas draining or having vesicles.  Reports that the areas on her left arm showed this morning.  Denies pain or burning at rash sites.  DIARRHEA: Duration:days, symptoms lasted for 4 days Onset: sudden Severity: she reports the abdominal pain has improved.  No pain in two days. Quality: No further abdominal pain Location:  no further abdominal pain  Episode duration:  Radiation: no Frequency: no further abdominal pain Alleviating factors:  Aggravating factors: Status: better Treatments attempted: Focused on liquid diet when having pain. Fever: no, initially had fever but has been normal over past few days Nausea: no Vomiting: no Weight loss: no Decreased appetite: yes Diarrhea: no Constipation: no Blood in stool: no Heartburn: no Jaundice: no Rash: yes Dysuria/urinary frequency: no Hematuria: no History of sexually transmitted disease: no Recurrent NSAID use: no Was at church on 27th of September and was lifting boxes and then following day was not "feeling well".  Could not control her bladder.  During that following week she began having diarrhea.  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  She reports the children in the nursery "seemed fine".  At the time she could not get to provider, as she was stuck in bed.  Diarrhea is now improved and no longer having bladder issues.  Diarrhea lasted for three days, followed by nonproductive cough.  No nasal drainage.  Symptoms are improved, but she reports she still feels "bad" and has little appetite.  She also takes care of her twin eight-year-old grandchildren.  She reports they have been healthy.  Relevant past medical, surgical, family and social history reviewed and updated as indicated. Interim medical history since our last visit reviewed. Allergies and medications reviewed and updated.  Review of Systems  Constitutional: Positive for appetite change and fatigue. Negative for chills and fever.       Decreased appetite at this time  Respiratory: Positive for cough. Negative for chest tightness, shortness of breath and wheezing.        Nonproductive, "small cough here and there"  Cardiovascular: Negative for chest pain and palpitations.  Gastrointestinal: Negative for abdominal distention, abdominal pain, constipation, diarrhea, nausea and vomiting.  Genitourinary: Negative for difficulty urinating, dysuria, frequency and urgency.  Neurological: Negative for dizziness, tremors, light-headedness and headaches.    Per HPI unless specifically indicated above     Objective:    BP 128/81   Pulse 78  Temp 98.8 F (37.1 C) (Oral)   Ht '5\' 5"'  (1.651 m)   Wt 215 lb 8 oz (97.8 kg)   SpO2 95%   BMI 35.86 kg/m   Wt Readings from Last 3 Encounters:  01/15/18 215 lb 8 oz (97.8 kg)  11/28/17 224 lb 3.2 oz (101.7 kg)  06/20/17 214 lb 12.8 oz (97.4 kg)    Physical Exam  Constitutional: She is oriented to person, place, and time. She appears well-developed and well-nourished.  HENT:  Head: Normocephalic and atraumatic.  Eyes: Pupils are equal, round, and reactive to light.  EOM are normal.  Cardiovascular: Normal rate, regular rhythm and normal heart sounds.  Pulmonary/Chest: Effort normal and breath sounds normal.  Abdominal: Soft. Normal appearance and bowel sounds are normal.  Neurological: She is alert and oriented to person, place, and time.  Skin: Skin is warm and dry.  Scattered small erythremic macules to lower abdomen, lower neck, and left arm.  No vesicles or drainage noted.  No tenderness to touch.  Areas on abdomen and neck were initial sites and are now crusting with fading of erythema inwards.  Left arm with small pale pink macules.   Psychiatric: She has a normal mood and affect. Her behavior is normal.     Results for orders placed or performed in visit on 11/28/17  Comp Met (CMET)  Result Value Ref Range   Glucose 122 (H) 65 - 99 mg/dL   BUN 19 8 - 27 mg/dL   Creatinine, Ser 0.72 0.57 - 1.00 mg/dL   GFR calc non Af Amer 86 >59 mL/min/1.73   GFR calc Af Amer 100 >59 mL/min/1.73   BUN/Creatinine Ratio 26 12 - 28   Sodium 143 134 - 144 mmol/L   Potassium 4.3 3.5 - 5.2 mmol/L   Chloride 105 96 - 106 mmol/L   CO2 22 20 - 29 mmol/L   Calcium 9.5 8.7 - 10.3 mg/dL   Total Protein 6.6 6.0 - 8.5 g/dL   Albumin 4.2 3.6 - 4.8 g/dL   Globulin, Total 2.4 1.5 - 4.5 g/dL   Albumin/Globulin Ratio 1.8 1.2 - 2.2   Bilirubin Total <0.2 0.0 - 1.2 mg/dL   Alkaline Phosphatase 110 39 - 117 IU/L   AST 14 0 - 40 IU/L   ALT 15 0 - 32 IU/L  HgB A1c  Result Value Ref Range   Hgb A1c MFr Bld 5.5 4.8 - 5.6 %   Est. average glucose Bld gHb Est-mCnc 111 mg/dL      Assessment & Plan:   Problem List Items Addressed This Visit      Musculoskeletal and Integument   Viral rash    Acute/improving.  Rash formation noted after viral symptoms dissipated.  May take OTC Claritin 10MG PO QDAY as needed and use Aveeno Oatmeal soap for comfort.  Return to office if worsening rash or vesicles noted.        Other   Diarrhea    Acute and now improved per patient  report with no further diarrhea in several days and no fever.  Continue at home management with BRAT diet and advance as tolerated.  Return to office if worsening symptoms.          Follow up plan: Return if symptoms worsen or fail to improve.

## 2018-01-15 NOTE — Assessment & Plan Note (Signed)
Acute and now improved per patient report with no further diarrhea in several days and no fever.  Continue at home management with BRAT diet and advance as tolerated.  Return to office if worsening symptoms.

## 2018-03-20 DIAGNOSIS — I10 Essential (primary) hypertension: Secondary | ICD-10-CM | POA: Diagnosis not present

## 2018-03-20 DIAGNOSIS — I519 Heart disease, unspecified: Secondary | ICD-10-CM | POA: Diagnosis not present

## 2018-03-20 DIAGNOSIS — G4733 Obstructive sleep apnea (adult) (pediatric): Secondary | ICD-10-CM | POA: Diagnosis not present

## 2018-03-20 DIAGNOSIS — R0602 Shortness of breath: Secondary | ICD-10-CM | POA: Diagnosis not present

## 2018-05-18 ENCOUNTER — Encounter: Payer: Self-pay | Admitting: Nurse Practitioner

## 2018-05-18 DIAGNOSIS — R7301 Impaired fasting glucose: Secondary | ICD-10-CM | POA: Insufficient documentation

## 2018-05-19 ENCOUNTER — Ambulatory Visit (INDEPENDENT_AMBULATORY_CARE_PROVIDER_SITE_OTHER): Payer: Medicare Other

## 2018-05-19 ENCOUNTER — Ambulatory Visit (INDEPENDENT_AMBULATORY_CARE_PROVIDER_SITE_OTHER): Payer: Medicare Other | Admitting: Nurse Practitioner

## 2018-05-19 ENCOUNTER — Other Ambulatory Visit: Payer: Self-pay

## 2018-05-19 ENCOUNTER — Other Ambulatory Visit: Payer: Self-pay | Admitting: Nurse Practitioner

## 2018-05-19 ENCOUNTER — Encounter: Payer: Self-pay | Admitting: Nurse Practitioner

## 2018-05-19 VITALS — BP 138/82 | HR 80 | Temp 98.1°F | Ht 66.0 in | Wt 224.0 lb

## 2018-05-19 VITALS — BP 138/82 | HR 80 | Temp 98.1°F | Resp 16 | Ht 66.0 in | Wt 224.0 lb

## 2018-05-19 DIAGNOSIS — Z0000 Encounter for general adult medical examination without abnormal findings: Secondary | ICD-10-CM | POA: Diagnosis not present

## 2018-05-19 DIAGNOSIS — Z1231 Encounter for screening mammogram for malignant neoplasm of breast: Secondary | ICD-10-CM

## 2018-05-19 DIAGNOSIS — E782 Mixed hyperlipidemia: Secondary | ICD-10-CM

## 2018-05-19 DIAGNOSIS — D1430 Benign neoplasm of unspecified bronchus and lung: Secondary | ICD-10-CM

## 2018-05-19 DIAGNOSIS — Z1239 Encounter for other screening for malignant neoplasm of breast: Secondary | ICD-10-CM | POA: Diagnosis not present

## 2018-05-19 DIAGNOSIS — I10 Essential (primary) hypertension: Secondary | ICD-10-CM

## 2018-05-19 NOTE — Patient Instructions (Addendum)
Fat and Cholesterol Restricted Eating Plan Getting too much fat and cholesterol in your diet may cause health problems. Choosing the right foods helps keep your fat and cholesterol at normal levels. This can keep you from getting certain diseases. Your doctor may recommend an eating plan that includes:  Total fat: ______% or less of total calories a day.  Saturated fat: ______% or less of total calories a day.  Cholesterol: less than _________mg a day.  Fiber: ______g a day. What are tips for following this plan? Meal planning  At meals, divide your plate into four equal parts: ? Fill one-half of your plate with vegetables and green salads. ? Fill one-fourth of your plate with whole grains. ? Fill one-fourth of your plate with low-fat (lean) protein foods.  Eat fish that is high in omega-3 fats at least two times a week. This includes mackerel, tuna, sardines, and salmon.  Eat foods that are high in fiber, such as whole grains, beans, apples, broccoli, carrots, peas, and barley. General tips   Work with your doctor to lose weight if you need to.  Avoid: ? Foods with added sugar. ? Fried foods. ? Foods with partially hydrogenated oils.  Limit alcohol intake to no more than 1 drink a day for nonpregnant women and 2 drinks a day for men. One drink equals 12 oz of beer, 5 oz of wine, or 1 oz of hard liquor. Reading food labels  Check food labels for: ? Trans fats. ? Partially hydrogenated oils. ? Saturated fat (g) in each serving. ? Cholesterol (mg) in each serving. ? Fiber (g) in each serving.  Choose foods with healthy fats, such as: ? Monounsaturated fats. ? Polyunsaturated fats. ? Omega-3 fats.  Choose grain products that have whole grains. Look for the word "whole" as the first word in the ingredient list. Cooking  Cook foods using low-fat methods. These include baking, boiling, grilling, and broiling.  Eat more home-cooked foods. Eat at restaurants and buffets  less often.  Avoid cooking using saturated fats, such as butter, cream, palm oil, palm kernel oil, and coconut oil. Recommended foods  Fruits  All fresh, canned (in natural juice), or frozen fruits. Vegetables  Fresh or frozen vegetables (raw, steamed, roasted, or grilled). Green salads. Grains  Whole grains, such as whole wheat or whole grain breads, crackers, cereals, and pasta. Unsweetened oatmeal, bulgur, barley, quinoa, or brown rice. Corn or whole wheat flour tortillas. Meats and other protein foods  Ground beef (85% or leaner), grass-fed beef, or beef trimmed of fat. Skinless chicken or turkey. Ground chicken or turkey. Pork trimmed of fat. All fish and seafood. Egg whites. Dried beans, peas, or lentils. Unsalted nuts or seeds. Unsalted canned beans. Nut butters without added sugar or oil. Dairy  Low-fat or nonfat dairy products, such as skim or 1% milk, 2% or reduced-fat cheeses, low-fat and fat-free ricotta or cottage cheese, or plain low-fat and nonfat yogurt. Fats and oils  Tub margarine without trans fats. Light or reduced-fat mayonnaise and salad dressings. Avocado. Olive, canola, sesame, or safflower oils. The items listed above may not be a complete list of foods and beverages you can eat. Contact a dietitian for more information. Foods to avoid Fruits  Canned fruit in heavy syrup. Fruit in cream or butter sauce. Fried fruit. Vegetables  Vegetables cooked in cheese, cream, or butter sauce. Fried vegetables. Grains  White bread. White pasta. White rice. Cornbread. Bagels, pastries, and croissants. Crackers and snack foods that contain trans fat   and hydrogenated oils. Meats and other protein foods  Fatty cuts of meat. Ribs, chicken wings, bacon, sausage, bologna, salami, chitterlings, fatback, hot dogs, bratwurst, and packaged lunch meats. Liver and organ meats. Whole eggs and egg yolks. Chicken and [place] with skin. Fried meat. Dairy  Whole or 2% milk, cream,  half-and-half, and cream cheese. Whole milk cheeses. Whole-fat or sweetened yogurt. Full-fat cheeses. Nondairy creamers and whipped toppings. Processed cheese, cheese spreads, and cheese curds. Beverages  Alcohol. Sugar-sweetened drinks such as sodas, lemonade, and fruit drinks. Fats and oils  Butter, stick margarine, lard, shortening, ghee, or bacon fat. Coconut, palm kernel, and palm oils. Sweets and desserts  Corn syrup, sugars, honey, and molasses. Candy. Jam and jelly. Syrup. Sweetened cereals. Cookies, pies, cakes, donuts, muffins, and ice cream. The items listed above may not be a complete list of foods and beverages you should avoid. Contact a dietitian for more information. Summary  Choosing the right foods helps keep your fat and cholesterol at normal levels. This can keep you from getting certain diseases.  At meals, fill one-half of your plate with vegetables and green salads.  Eat high-fiber foods, like whole grains, beans, apples, carrots, peas, and barley.  Limit added sugar, saturated fats, alcohol, and fried foods. This information is not intended to replace advice given to you by your health care provider. Make sure you discuss any questions you have with your health care provider. Document Released: 09/25/2011 Document Revised: 11/27/2017 Document Reviewed: 12/11/2016 Elsevier Interactive Patient Education  2019 [place] Rice capsules What is this medicine? RED YEAST RICE (red yeest rahys) is intended to be used by healthy adults to help lower blood cholesterol in conjunction with a healthy diet and a regular exercise program. The FDA has not approved this supplement for any medical use. If medical treatment is needed for cholesterol control or any other disease, you should contact your doctor or health care professional regarding the use of this product. This supplement may be used for other purposes; ask your health care provider or pharmacist if you  have questions. This medicine may be used for other purposes; ask your health care provider or pharmacist if you have questions. COMMON BRAND NAME(S): Red Yeast Rice What should I tell my health care provider before I take this medicine? They need to know if you have any of these conditions: -frequently drink alcoholic beverages -kidney disease -liver disease -muscle aches or weakness -other medical condition -an unusual or allergic reaction to red yeast rice, went yeast, lovastatin, other 'statin' medications, other medicines, foods, dyes, or preservatives -pregnant or trying to get pregnant -breast-feeding How should I use this medicine? Take this supplement by mouth with a glass of water. Follow the directions on the package labeling, or take as directed by your health care professional. Do not take this supplement more often than directed. Contact your pediatrician or health care professional regarding the use of this supplement in children. Special care may be needed. This supplement is not recommended for use in children. Overdosage: If you think you have taken too much of this medicine contact a poison control center or emergency room at once. NOTE: This medicine is only for you. Do not share this medicine with others. What if I miss a dose? If you miss a dose, take it as soon as you can. If it is almost time for your next dose, take only that dose. Do not take double or extra doses. What may interact with this  medicine? Do not take this medicine with any of the following medications: -clarithromycin -delavirdine -erythromycin -grapefruit juice -protease inhibitors used to treat HIV infection -medicines for fungal infections like itraconazole, ketoconazole, posaconazole, and voriconazole -mibefradil -nefazodone -other medicines for high cholesterol -telithromycin -troleandomycin This medicine may also interact with the following  medications: -alcohol -amiodarone -colchicine -cyclosporine -danazol -diltiazem -fenofibrate -fluconazole -gemfibrozil -mifepristone, RU-486 -niacin -St. John's wort -verapamil -voriconazole -warfarin This list may not describe all possible interactions. Give your health care provider a list of all the medicines, herbs, non-prescription drugs, or dietary supplements you use. Also tell them if you smoke, drink alcohol, or use illegal drugs. Some items may interact with your medicine. What should I watch for while using this medicine? Visit your doctor or health care professional for regular check-ups. You may need regular tests to make sure your liver is working properly. Tell you doctor or health care professional right away if you get any unexplained muscle pain, tenderness, or weakness, especially if you also have a fever and tiredness. Some drugs may increase the risk of side effects from this supplement. If you are given certain antibiotics or antifungals, you should stop taking this supplement during those treatments. Check with your doctor or pharmacist for advice. If you are scheduled for any medical or dental procedure, tell your healthcare provider that you are taking this supplement. You may need to stop taking this supplement before the procedure. Do not use this drug if you are pregnant or breast-feeding. Serious side effects to an unborn child or to an infant are possible. Talk to your doctor or pharmacist for more information. Herbal or dietary supplements are not regulated like medicines. Rigid quality control standards are not required for dietary supplements. The purity and strength of these products can vary. The safety and effect of this dietary supplement for a certain disease or illness is not well known. This product is not intended to diagnose, treat, cure or prevent any disease. The Food and Drug Administration suggests the following to help consumers protect  themselves: -Always read product labels and follow directions. -Natural does not mean a product is safe for humans to take. -Look for products that include USP after the ingredient name. This means that the manufacturer followed the standards of the [name] Pharmacopoeia. -Supplements made or sold by a nationally known food or drug company are more likely to be made under tight controls. You can write to the company for more information about how the product was made. What side effects may I notice from receiving this medicine? Side effects that you should report to your doctor or health care professional as soon as possible: -allergic reactions like skin rash, itching or hives, swelling of the face, lips, or tongue -dark urine -fever -joint pain -muscle cramps, pain -redness, blistering, peeling or loosening of the skin, including inside the mouth -trouble passing urine or change in the amount of urine -unusually weak or tired -yellowing of the eyes or skin Side effects that usually do not require medical attention (report to your doctor or health care professional if they continue or are bothersome): -constipation -headache -stomach gas, pain, upset -nausea -trouble sleeping This list may not describe all possible side effects. Call your doctor for medical advice about side effects. You may report side effects to FDA at 1-800-FDA-1088. Where should I keep my medicine? Keep out of the reach of children. Store at room temperature between 15 and 30 degrees C (59 and 86 degrees F). Throw away any  unused medicine after the expiration date. NOTE: This sheet is a summary. It may not cover all possible information. If you have questions about this medicine, talk to your doctor, pharmacist, or health care provider.  2019 Elsevier/Gold Standard (2013-12-01 10:26:14)

## 2018-05-19 NOTE — Assessment & Plan Note (Signed)
Yearly CXR ordered. °

## 2018-05-19 NOTE — Progress Notes (Signed)
Subjective:   Melissa Merritt is a 68 y.o. female who presents for Medicare Annual (Subsequent) preventive examination.  Review of Systems:   Cardiac Risk Factors include: advanced age (>34men, >53 women);dyslipidemia;hypertension;obesity (BMI >30kg/m2)     Objective:     Vitals: BP 138/82 (BP Location: Left Arm, Patient Position: Sitting, Cuff Size: Normal)   Pulse 80   Temp 98.1 F (36.7 C) (Temporal)   Resp 16   Ht 5\' 6"  (1.676 m)   Wt 224 lb (101.6 kg)   BMI 36.15 kg/m   Body mass index is 36.15 kg/m.  Advanced Directives 05/17/2017 05/23/2016 05/23/2015 05/23/2015  Does Patient Have a Medical Advance Directive? Yes No;Yes Yes Yes  Type of Paramedic of [place];Living will [place];Living will [place];Living will [place];Living will  Does patient want to make changes to medical advance directive? - - No - Patient declined -  Copy of [place] in Chart? No - copy requested - No - copy requested -    Tobacco Social History   Tobacco Use  Smoking Status Former Smoker  . Last attempt to quit: 07/09/1979  . Years since quitting: 38.8  Smokeless Tobacco Never Used     Counseling given: Not Answered   Clinical Intake:  Pre-visit preparation completed: Yes  Pain : No/denies pain     Nutritional Status: BMI > 30  Obese Nutritional Risks: None Diabetes: No  How often do you need to have someone help you when you read instructions, pamphlets, or other written materials from your doctor or pharmacy?: 1 - Never What is the last grade level you completed in school?: some college  Interpreter Needed?: No  Information entered by :: Tiffany Hill,LPN   Past Medical History:  Diagnosis Date  . Benign tumor of lung   . CHF (congestive heart failure) ([facility])   . GERD (gastroesophageal reflux disease)   . Hyperlipidemia   . Hypertension   . Insomnia   .  Osteopenia   . Plantar fasciitis   . S/P pneumonectomy    Past Surgical History:  Procedure Laterality Date  . bronchoscopy    . CATARACT EXTRACTION, BILATERAL     Left- 11/21/15, right- 12/23/15  . CHOLECYSTECTOMY    . CYSTECTOMY  11/03/09   cheek  . EYE SURGERY    . FOOT SURGERY Bilateral 2010  . LOBECTOMY    . LUNG SURGERY Right   . UPPER GI ENDOSCOPY  2005   Family History  Problem Relation Age of Onset  . COPD Mother   . Hypertension Mother   . Emphysema Mother   . Osteoporosis Mother   . Heart disease Father   . Hyperlipidemia Sister   . Hyperlipidemia Brother   . Heart disease Maternal Grandmother   . Heart disease Maternal Grandfather   . Heart disease Paternal Grandmother   . Stroke Paternal Grandmother   . Heart disease Paternal Grandfather   . Stroke Paternal Grandfather   . Cancer Maternal Aunt        breast and lung  . Osteoporosis Maternal Aunt   . Aortic aneurysm Maternal Aunt   . Breast cancer Maternal Aunt 76   Social History   Socioeconomic History  . Marital status: Married    Spouse name: Not on file  . Number of children: Not on file  . Years of education: Not on file  . Highest education level: Some college, no degree  Occupational History  . Occupation: retired   Scientific laboratory technician  . Financial resource strain: Not hard at all  . Food insecurity:    Worry: Never true    Inability: Never true  . Transportation needs:    Medical: No    Non-medical: No  Tobacco Use  . Smoking status: Former Smoker    Last attempt to quit: 07/09/1979    Years since quitting: 38.8  . Smokeless tobacco: Never Used  Substance and Sexual Activity  . Alcohol use: No  . Drug use: No  . Sexual activity: Yes  Lifestyle  . Physical activity:    Days per week: 0 days    Minutes per session: 0 min  . Stress: Not at all  Relationships  . Social connections:    Talks on phone: More than three times a week    Gets together: More than three times a week    Attends  religious service: More than 4 times per year    Active member of club or organization: No    Attends meetings of clubs or organizations: Never    Relationship status: Married  Other Topics Concern  . Not on file  Social History Narrative  . Not on file    Outpatient Encounter Medications as of 05/19/2018  Medication Sig  . amLODipine (NORVASC) 5 MG tablet Take 1 tablet (5 mg total) by mouth daily.  Marland Kitchen aspirin 81 MG tablet Take 81 mg by mouth daily.  . Biotin 5000 MCG TABS Take 5,000 mcg by mouth 2 (two) times daily.  . Calcium Carb-Cholecalciferol (CALCIUM-VITAMIN D) 600-400 MG-UNIT TABS Take 1 tablet by mouth 2 (two) times daily.  . Cyanocobalamin (B-12) 2500 MCG TABS Take 2,500 mcg by mouth daily.  Marland Kitchen erythromycin [facility]) ophthalmic ointment Place 1 application into the right eye at bedtime.  . metoprolol succinate (TOPROL-XL) 25 MG 24 hr tablet Take 1 tablet (25 mg total) by mouth daily.  . pantoprazole (PROTONIX) 40 MG tablet Take 1 tablet (40 mg total) by mouth 2 (two) times daily.  . St Johns Wort 300 MG CAPS Take 300 mg by mouth 2 (two) times daily.  . valsartan (DIOVAN) 160 MG tablet Take 1 tablet (160 mg total) by mouth daily.   No facility-administered encounter medications on file as of 05/19/2018.     Activities of Daily Living In your present state of health, do you have any difficulty performing the following activities: 05/19/2018  Hearing? N  Vision? Y  Comment reading glasses   Difficulty concentrating or making decisions? N  Walking or climbing stairs? N  Dressing or bathing? N  Doing errands, shopping? N  Preparing Food and eating ? N  Using the Toilet? N  In the past six months, have you accidently leaked urine? Y  Comment wears protection at night   Do you have problems with loss of bowel control? N  Managing your Medications? N  Managing your Finances? N  Housekeeping or managing your Housekeeping? N  Some recent data might be hidden    Patient Care  Team: Venita Lick, NP as PCP - General (Nurse Practitioner) Idelle Leech, OD (Optometry) Isaias Cowman, MD as Consulting Physician (Cardiology) Manya Silvas, MD (Gastroenterology) Lucky Cowboy Erskine Squibb, MD as Referring Physician (Vascular Surgery) Benjaman Kindler, MD as Consulting Physician (Obstetrics and Gynecology)    Assessment:   This is a routine wellness examination for Doralyn.  Exercise Activities and Dietary recommendations Current Exercise Habits: The patient does not participate  in regular exercise at present, Exercise limited by: None identified  Goals    . DIET - INCREASE WATER INTAKE     Recommend drinking at least 6-8 glasses of water a day        Fall Risk Fall Risk  05/19/2018 05/17/2017 05/23/2016 05/23/2015  Falls in the past year? 0 Yes Yes Yes  Number falls in past yr: 0 1 2 or more 1  Injury with Fall? - No No No   FALL RISK PREVENTION PERTAINING TO THE HOME:  Any stairs in or around the home WITH handrails? Yes  all stairs have handrails  Home free of loose throw rugs in walkways, pet beds, electrical cords, etc? Yes  Adequate lighting in your home to reduce risk of falls? Yes   ASSISTIVE DEVICES UTILIZED TO PREVENT FALLS:  Life alert? No  Use of a cane, walker or w/c? No  Grab bars in the bathroom? No  Shower chair or bench in shower? No  Elevated toilet seat or a handicapped toilet? Yes   DME ORDERS:  DME order needed?  No   TIMED UP AND GO:  Was the test performed? Yes .  Length of time to ambulate 10 feet: 10 sec.   GAIT:  Appearance of gait: Gait stead-fast without the use of an assistive device.  Education: Fall risk prevention has been discussed.  Intervention(s) required? No    Depression Screen PHQ 2/9 Scores 05/19/2018 05/17/2017 05/23/2016 05/23/2015  PHQ - 2 Score 0 0 0 0  PHQ- 9 Score - - 1 -     Cognitive Function     6CIT Screen 05/19/2018 05/17/2017  What Year? 0 points 0 points  What month? 0 points 0 points    What time? 0 points 0 points  Count back from 20 0 points 0 points  Months in reverse 0 points 0 points  Repeat phrase 0 points 0 points  Total Score 0 0    Immunization History  Administered Date(s) Administered  . Influenza, High Dose Seasonal PF 12/27/2015, 01/15/2018  . Influenza,inj,Quad PF,6+ Mos 01/21/2015  . Influenza-Unspecified 02/08/2017  . Pneumococcal Conjugate-13 05/19/2014  . Pneumococcal Polysaccharide-23 01/14/2008, 05/23/2015  . Td 09/11/2005  . Tdap 12/27/2015  . Zoster 09/04/2011    Qualifies for Shingles Vaccine?Yes  Zostavax completed 10/05/2011. Due for Shingrix. Education has been provided regarding the importance of this vaccine. Pt has been advised to call insurance company to determine out of pocket expense. Advised may also receive vaccine at local pharmacy or Health Dept. Verbalized acceptance and understanding.  Tdap: up to date   Flu Vaccine:up to date   Pneumococcal Vaccine: up to date   Screening Tests Health Maintenance  Topic Date Due  . MAMMOGRAM  04/11/2018  . COLONOSCOPY  10/20/2023  . TETANUS/TDAP  12/26/2025  . INFLUENZA VACCINE  Completed  . DEXA SCAN  Completed  . Hepatitis C Screening  Completed  . PNA vac Low Risk Adult  Completed    Cancer Screenings:  Colorectal Screening: Completed 05/19/2014. Repeat every 10 years   Mammogram: Completed 04/11/2016  Repeat every 2 years.  Bone Density: Completed 05/19/2014.   Lung Cancer Screening: (Low Dose CT Chest recommended if Age 18-80 years, 30 pack-year currently smoking OR have quit w/in 15years.) does not qualify.  Patient gets yearly x rays for past history of lung cancer.    Additional Screening:  Hepatitis C Screening: does not qualify; Completed 12/27/2015  Vision Screening: Recommended annual ophthalmology exams for early detection  of glaucoma and other disorders of the eye. Is the patient up to date with their annual eye exam?  Yes  Who is the provider or what is the  name of the office in which the pt attends annual eye exams? Dr.Nice    Dental Screening: Recommended annual dental exams for proper oral hygiene  Community Resource Referral:  CRR required this visit?  No      Plan:    I have personally reviewed and addressed the Medicare Annual Wellness questionnaire and have noted the following in the patient's chart:  A. Medical and social history B. Use of alcohol, tobacco or illicit drugs  C. Current medications and supplements D. Functional ability and status E.  Nutritional status F.  Physical activity G. Advance directives H. List of other physicians I.  Hospitalizations, surgeries, and ER visits in previous 12 months J.  [place] such as hearing and vision if needed, cognitive and depression L. Referrals and appointments   In addition, I have reviewed and discussed with patient certain preventive protocols, quality metrics, and best practice recommendations. A written personalized care plan for preventive services as well as general preventive health recommendations were provided to patient.   Signed,  Tyler Aas, LPN Nurse Health Advisor   Nurse Notes:none

## 2018-05-19 NOTE — Progress Notes (Signed)
BP 138/82   Pulse 80   Temp 98.1 F (36.7 C) (Oral)   Ht '5\' 6"'  (1.676 m)   Wt 224 lb (101.6 kg)   SpO2 96%   BMI 36.15 kg/m    Subjective:    Patient ID: Melissa Merritt, female    DOB: February 14, 1951, 68 y.o.   MRN: [id number]  HPI: Melissa Merritt is a 68 y.o. female  Chief Complaint  Patient presents with  . Hypertension    f/u  . Hyperlipidemia   HYPERTENSION / HYPERLIPIDEMIA She continues on Valsartan, Metoprolol, and Norvasc for BP.  No current statin medications.  Her last LDL was 125 and TCHOL 209, in February 2019.  She refuses statin therapy.  She is followed by Dr. Josefa Half every 6 months.   Satisfied with current treatment? yes Duration of hypertension: chronic BP monitoring frequency: daily BP range: 120-130/70-80 BP medication side effects: no Duration of hyperlipidemia: chronic Cholesterol supplements: none Aspirin: no Recent stressors: no Recurrent headaches: no Visual changes: no Palpitations: no Dyspnea: no Chest pain: no Lower extremity edema: no Dizzy/lightheaded: no   The 10-year ASCVD risk score [name], et al., 2013) is: 13.2%   Values used to calculate the score:     Age: 64 years     Sex: Female     Is Non-Hispanic African American: No     Diabetic: No     Tobacco smoker: No     Systolic Blood Pressure: 916 mmHg     Is BP treated: Yes     HDL Cholesterol: 57 mg/dL     Total Cholesterol: 209 mg/dL  HISTORY OF BENIGN TUMOR OF LUNG: Reports she obtains yearly chest X-rays to monitor this.  Last x-ray was in February 2019. She is requesting order for imaging today.  Denies cough, hemoptysis, chest pain, or SOB.  Relevant past medical, surgical, family and social history reviewed and updated as indicated. Interim medical history since our last visit reviewed. Allergies and medications reviewed and updated.  Review of Systems  Constitutional: Negative for activity change, appetite change, diaphoresis, fatigue and  fever.  Respiratory: Negative for cough, chest tightness and shortness of breath.   Cardiovascular: Negative for chest pain, palpitations and leg swelling.  Gastrointestinal: Negative for abdominal distention, abdominal pain, constipation, diarrhea, nausea and vomiting.  Endocrine: Negative for cold intolerance, heat intolerance, polydipsia, polyphagia and polyuria.  Neurological: Negative for dizziness, syncope, weakness, light-headedness, numbness and headaches.  Psychiatric/Behavioral: Negative.     Per HPI unless specifically indicated above     Objective:    BP 138/82   Pulse 80   Temp 98.1 F (36.7 C) (Oral)   Ht '5\' 6"'  (1.676 m)   Wt 224 lb (101.6 kg)   SpO2 96%   BMI 36.15 kg/m   Wt Readings from Last 3 Encounters:  05/19/18 224 lb (101.6 kg)  05/19/18 224 lb (101.6 kg)  01/15/18 215 lb 8 oz (97.8 kg)    Physical Exam Vitals signs and nursing note reviewed.  Constitutional:      General: She is awake.     Appearance: She is well-developed.  HENT:     Head: Normocephalic.     Right Ear: Hearing normal.     Left Ear: Hearing normal.     Nose: Nose normal.     Mouth/Throat:     Mouth: Mucous membranes are moist.  Eyes:     General: Lids are normal.        Right  eye: No discharge.        Left eye: No discharge.     Conjunctiva/sclera: Conjunctivae normal.     Pupils: Pupils are equal, round, and reactive to light.  Neck:     Musculoskeletal: Normal range of motion and neck supple.     Thyroid: No thyromegaly.     Vascular: No carotid bruit or JVD.  Cardiovascular:     Rate and Rhythm: Normal rate and regular rhythm.     Heart sounds: Normal heart sounds. No murmur. No gallop.   Pulmonary:     Effort: Pulmonary effort is normal.     Breath sounds: Normal breath sounds.  Abdominal:     General: Bowel sounds are normal.     Palpations: Abdomen is soft. There is no hepatomegaly or splenomegaly.  Musculoskeletal:     Right lower leg: No edema.     Left lower  leg: No edema.  Lymphadenopathy:     Cervical: No cervical adenopathy.  Skin:    General: Skin is warm and dry.  Neurological:     Mental Status: She is alert and oriented to person, place, and time.  Psychiatric:        Attention and Perception: Attention normal.        Mood and Affect: Mood normal.        Behavior: Behavior normal. Behavior is cooperative.        Thought Content: Thought content normal.        Judgment: Judgment normal.     Results for orders placed or performed in visit on 11/28/17  Comp Met (CMET)  Result Value Ref Range   Glucose 122 (H) 65 - 99 mg/dL   BUN 19 8 - 27 mg/dL   Creatinine, Ser 0.72 0.57 - 1.00 mg/dL   GFR calc non Af Amer 86 >59 mL/min/1.73   GFR calc Af Amer 100 >59 mL/min/1.73   BUN/Creatinine Ratio 26 12 - 28   Sodium 143 134 - 144 mmol/L   Potassium 4.3 3.5 - 5.2 mmol/L   Chloride 105 96 - 106 mmol/L   CO2 22 20 - 29 mmol/L   Calcium 9.5 8.7 - 10.3 mg/dL   Total Protein 6.6 6.0 - 8.5 g/dL   Albumin 4.2 3.6 - 4.8 g/dL   Globulin, Total 2.4 1.5 - 4.5 g/dL   Albumin/Globulin Ratio 1.8 1.2 - 2.2   Bilirubin Total <0.2 0.0 - 1.2 mg/dL   Alkaline Phosphatase 110 39 - 117 IU/L   AST 14 0 - 40 IU/L   ALT 15 0 - 32 IU/L  HgB A1c  Result Value Ref Range   Hgb A1c MFr Bld 5.5 4.8 - 5.6 %   Est. average glucose Bld gHb Est-mCnc 111 mg/dL      Assessment & Plan:   Problem List Items Addressed This Visit      Cardiovascular and Mediastinum   Essential hypertension - Primary    Chronic, slightly elevated today but normal readings at home. Continue current medication regimen and collaboration with cardiology.      Relevant Orders   Comprehensive metabolic panel   CBC with Differential/Platelet     Respiratory   Benign tumor of lung    Yearly CXR ordered      Relevant Orders   DG Chest 2 View     Other   Hyperlipidemia    Chronic, reviewed ASCVD score with patient.  She continues to refuse statin, but would like lipid panel  done today.  Is fasting.  CMP and lipid panel ordered.      Relevant Orders   Lipid Panel w/o Chol/HDL Ratio       Follow up plan: Return in about 6 months (around 11/17/2018) for HTN/HLD/GERD.

## 2018-05-19 NOTE — Assessment & Plan Note (Signed)
Chronic, reviewed ASCVD score with patient.  She continues to refuse statin, but would like lipid panel done today.  Is fasting.  CMP and lipid panel ordered.

## 2018-05-19 NOTE — Assessment & Plan Note (Signed)
Chronic, slightly elevated today but normal readings at home. Continue current medication regimen and collaboration with cardiology.

## 2018-05-19 NOTE — Patient Instructions (Addendum)
[name] , Thank you for taking time to come for your Medicare Wellness Visit. I appreciate your ongoing commitment to your health goals. Please review the following plan we discussed and let me know if I can assist you in the future.   Screening recommendations/referrals: Colonoscopy: completed 05/19/2014 Mammogram: completed 04/11/2016. Please call [phone] to schedule your mammogram.  Bone Density: completed 05/19/2014 Recommended yearly ophthalmology/optometry visit for glaucoma screening and checkup Recommended yearly dental visit for hygiene and checkup  Vaccinations: Influenza vaccine: up to date  Pneumococcal vaccine: up to date  Tdap vaccine: up to date  Shingles vaccine: shingrix eligible, check with your insurance company for coverage   Advanced directives:  Please bring a copy of your health care power of attorney and living will to the office at your convenience.  Conditions/risks identified: recommend drinking at least 6-8 glasses of water a day   Next appointment: Follow up in one year for your annual wellness exam.     Preventive Care 65 Years and Older, Female Preventive care refers to lifestyle choices and visits with your health care provider that can promote health and wellness. What does preventive care include?  A yearly physical exam. This is also called an annual well check.  Dental exams once or twice a year.  Routine eye exams. Ask your health care provider how often you should have your eyes checked.  Personal lifestyle choices, including:  Daily care of your teeth and gums.  Regular physical activity.  Eating a healthy diet.  Avoiding tobacco and drug use.  Limiting alcohol use.  Practicing safe sex.  Taking low-dose aspirin every day.  Taking vitamin and mineral supplements as recommended by your health care provider. What happens during an annual well check? The services and screenings done by your health care provider during your  annual well check will depend on your age, overall health, lifestyle risk factors, and family history of disease. Counseling  Your health care provider may ask you questions about your:  Alcohol use.  Tobacco use.  Drug use.  Emotional well-being.  Home and relationship well-being.  Sexual activity.  Eating habits.  History of falls.  Memory and ability to understand (cognition).  Work and work Statistician.  Reproductive health. Screening  You may have the following tests or measurements:  Height, weight, and BMI.  Blood pressure.  Lipid and cholesterol levels. These may be checked every 5 years, or more frequently if you are over 63 years old.  Skin check.  Lung cancer screening. You may have this screening every year starting at age 22 if you have a 30-pack-year history of smoking and currently smoke or have quit within the past 15 years.  Fecal occult blood test (FOBT) of the stool. You may have this test every year starting at age 42.  Flexible sigmoidoscopy or colonoscopy. You may have a sigmoidoscopy every 5 years or a colonoscopy every 10 years starting at age 48.  Hepatitis C blood test.  Hepatitis B blood test.  Sexually transmitted disease (STD) testing.  Diabetes screening. This is done by checking your blood sugar (glucose) after you have not eaten for a while (fasting). You may have this done every 1-3 years.  Bone density scan. This is done to screen for osteoporosis. You may have this done starting at age 55.  Mammogram. This may be done every 1-2 years. Talk to your health care provider about how often you should have regular mammograms. Talk with your health care provider about  your test results, treatment options, and if necessary, the need for more tests. Vaccines  Your health care provider may recommend certain vaccines, such as:  Influenza vaccine. This is recommended every year.  Tetanus, diphtheria, and acellular pertussis (Tdap, Td)  vaccine. You may need a Td booster every 10 years.  Zoster vaccine. You may need this after age 71.  Pneumococcal 13-valent conjugate (PCV13) vaccine. One dose is recommended after age 36.  Pneumococcal polysaccharide (PPSV23) vaccine. One dose is recommended after age 63. Talk to your health care provider about which screenings and vaccines you need and how often you need them. This information is not intended to replace advice given to you by your health care provider. Make sure you discuss any questions you have with your health care provider. Document Released: 04/22/2015 Document Revised: 12/14/2015 Document Reviewed: 01/25/2015 Elsevier Interactive Patient Education  2017 [employer] Prevention in the Home Falls can cause injuries. They can happen to people of all ages. There are many things you can do to make your home safe and to help prevent falls. What can I do on the outside of my home?  Regularly fix the edges of walkways and driveways and fix any cracks.  Remove anything that might make you trip as you walk through a door, such as a raised step or threshold.  Trim any bushes or trees on the path to your home.  Use bright outdoor lighting.  Clear any walking paths of anything that might make someone trip, such as rocks or tools.  Regularly check to see if handrails are loose or broken. Make sure that both sides of any steps have handrails.  Any raised decks and porches should have guardrails on the edges.  Have any leaves, snow, or ice cleared regularly.  Use sand or salt on walking paths during winter.  Clean up any spills in your garage right away. This includes oil or grease spills. What can I do in the bathroom?  Use night lights.  Install grab bars by the toilet and in the tub and shower. Do not use towel bars as grab bars.  Use non-skid mats or decals in the tub or shower.  If you need to sit down in the shower, use a plastic, non-slip  stool.  Keep the floor dry. Clean up any water that spills on the floor as soon as it happens.  Remove soap buildup in the tub or shower regularly.  Attach bath mats securely with double-sided non-slip rug tape.  Do not have throw rugs and other things on the floor that can make you trip. What can I do in the bedroom?  Use night lights.  Make sure that you have a light by your bed that is easy to reach.  Do not use any sheets or blankets that are too big for your bed. They should not hang down onto the floor.  Have a firm chair that has side arms. You can use this for support while you get dressed.  Do not have throw rugs and other things on the floor that can make you trip. What can I do in the kitchen?  Clean up any spills right away.  Avoid walking on wet floors.  Keep items that you use a lot in easy-to-reach places.  If you need to reach something above you, use a strong step stool that has a grab bar.  Keep electrical cords out of the way.  Do not use floor polish or wax  that makes floors slippery. If you must use wax, use non-skid floor wax.  Do not have throw rugs and other things on the floor that can make you trip. What can I do with my stairs?  Do not leave any items on the stairs.  Make sure that there are handrails on both sides of the stairs and use them. Fix handrails that are broken or loose. Make sure that handrails are as long as the stairways.  Check any carpeting to make sure that it is firmly attached to the stairs. Fix any carpet that is loose or worn.  Avoid having throw rugs at the top or bottom of the stairs. If you do have throw rugs, attach them to the floor with carpet tape.  Make sure that you have a light switch at the top of the stairs and the bottom of the stairs. If you do not have them, ask someone to add them for you. What else can I do to help prevent falls?  Wear shoes that:  Do not have high heels.  Have rubber bottoms.  Are  comfortable and fit you well.  Are closed at the toe. Do not wear sandals.  If you use a stepladder:  Make sure that it is fully opened. Do not climb a closed stepladder.  Make sure that both sides of the stepladder are locked into place.  Ask someone to hold it for you, if possible.  Clearly mark and make sure that you can see:  Any grab bars or handrails.  First and last steps.  Where the edge of each step is.  Use tools that help you move around (mobility aids) if they are needed. These include:  Canes.  Walkers.  Scooters.  Crutches.  Turn on the lights when you go into a dark area. Replace any light bulbs as soon as they burn out.  Set up your furniture so you have a clear path. Avoid moving your furniture around.  If any of your floors are uneven, fix them.  If there are any pets around you, be aware of where they are.  Review your medicines with your doctor. Some medicines can make you feel dizzy. This can increase your chance of falling. Ask your doctor what other things that you can do to help prevent falls. This information is not intended to replace advice given to you by your health care provider. Make sure you discuss any questions you have with your health care provider. Document Released: 01/20/2009 Document Revised: 09/01/2015 Document Reviewed: 04/30/2014 Elsevier Interactive Patient Education  2017 [employer].

## 2018-05-20 LAB — CBC WITH DIFFERENTIAL/PLATELET
Basophils Absolute: 0.1 10*3/uL (ref 0.0–0.2)
Basos: 1 %
EOS (ABSOLUTE): 0.3 10*3/uL (ref 0.0–0.4)
Eos: 4 %
Hematocrit: 37.2 % (ref 34.0–46.6)
Hemoglobin: 12.4 g/dL (ref 11.1–15.9)
IMMATURE GRANS (ABS): 0 10*3/uL (ref 0.0–0.1)
IMMATURE GRANULOCYTES: 0 %
Lymphocytes Absolute: 1.4 10*3/uL (ref 0.7–3.1)
Lymphs: 21 %
MCH: 28.3 pg (ref 26.6–33.0)
MCHC: 33.3 g/dL (ref 31.5–35.7)
MCV: 85 fL (ref 79–97)
Monocytes Absolute: 0.7 10*3/uL (ref 0.1–0.9)
Monocytes: 10 %
NEUTROS PCT: 64 %
Neutrophils Absolute: 4.5 10*3/uL (ref 1.4–7.0)
Platelets: 265 10*3/uL (ref 150–450)
RBC: 4.38 x10E6/uL (ref 3.77–5.28)
RDW: 13.7 % (ref 11.7–15.4)
WBC: 7 10*3/uL (ref 3.4–10.8)

## 2018-05-20 LAB — COMPREHENSIVE METABOLIC PANEL
ALT: 16 IU/L (ref 0–32)
AST: 19 IU/L (ref 0–40)
Albumin/Globulin Ratio: 2.6 — ABNORMAL HIGH (ref 1.2–2.2)
Albumin: 4.6 g/dL (ref 3.8–4.8)
Alkaline Phosphatase: 106 IU/L (ref 39–117)
BUN/Creatinine Ratio: 15 (ref 12–28)
BUN: 12 mg/dL (ref 8–27)
Bilirubin Total: 0.3 mg/dL (ref 0.0–1.2)
CO2: 26 mmol/L (ref 20–29)
Calcium: 9.7 mg/dL (ref 8.7–10.3)
Chloride: 103 mmol/L (ref 96–106)
Creatinine, Ser: 0.8 mg/dL (ref 0.57–1.00)
GFR calc Af Amer: 87 mL/min/{1.73_m2} (ref >59)
GFR calc non Af Amer: 75 mL/min/{1.73_m2} (ref >59)
Globulin, Total: 1.8 g/dL (ref 1.5–4.5)
Glucose: 111 mg/dL — ABNORMAL HIGH (ref 65–99)
Potassium: 4.2 mmol/L (ref 3.5–5.2)
Sodium: 143 mmol/L (ref 134–144)
Total Protein: 6.4 g/dL (ref 6.0–8.5)

## 2018-05-20 LAB — LIPID PANEL W/O CHOL/HDL RATIO
Cholesterol, Total: 225 mg/dL — ABNORMAL HIGH (ref 100–199)
HDL: 52 mg/dL (ref >39)
LDL Calculated: 150 mg/dL — ABNORMAL HIGH (ref 0–99)
Triglycerides: 115 mg/dL (ref 0–149)
VLDL Cholesterol Cal: 23 mg/dL (ref 5–40)

## 2018-05-22 ENCOUNTER — Ambulatory Visit
Admission: RE | Admit: 2018-05-22 | Discharge: 2018-05-22 | Disposition: A | Discharge status: Home / Self Care | Payer: Medicare Other | Source: Ambulatory Visit | Attending: Nurse Practitioner | Admitting: Nurse Practitioner

## 2018-05-22 DIAGNOSIS — Z1231 Encounter for screening mammogram for malignant neoplasm of breast: Secondary | ICD-10-CM

## 2018-05-26 ENCOUNTER — Ambulatory Visit
Admission: RE | Admit: 2018-05-26 | Discharge: 2018-05-26 | Disposition: A | Discharge status: Home / Self Care | Payer: Medicare Other | Source: Ambulatory Visit | Attending: Nurse Practitioner | Admitting: Nurse Practitioner

## 2018-05-26 DIAGNOSIS — D1430 Benign neoplasm of unspecified bronchus and lung: Secondary | ICD-10-CM | POA: Diagnosis not present

## 2018-05-26 DIAGNOSIS — J9811 Atelectasis: Secondary | ICD-10-CM | POA: Diagnosis not present

## 2018-05-29 ENCOUNTER — Ambulatory Visit: Payer: Medicare Other | Admitting: Nurse Practitioner

## 2018-07-24 ENCOUNTER — Other Ambulatory Visit: Payer: Self-pay | Admitting: Unknown Physician Specialty

## 2018-07-24 NOTE — Telephone Encounter (Signed)
Requested medication (s) are due for refill today -yes  Requested medication (s) are on the active medication list -yes  Future visit scheduled -yes  Last refill: 8 months ago for all Rx  Notes to clinic: Patient has established PCP with Ned Card- original Rx under [place]- all pass protocol for refill- can the correct PCP name be changed so it is correct on Rx. Patient has next appointment in August.  Requested Prescriptions  Pending Prescriptions Disp Refills   valsartan (DIOVAN) 160 MG tablet [Pharmacy Med Name: VALSARTAN 160 MG TABLET] 90 tablet 0    Sig: Take 1 tablet (160 mg total) by mouth daily.     Cardiovascular:  Angiotensin Receptor Blockers Passed - 07/24/2018 10:30 AM      Passed - Cr in normal range and within 180 days    Creatinine  Date Value Ref Range Status  11/11/2011 0.82 0.60 - 1.30 mg/dL Final   Creatinine, Ser  Date Value Ref Range Status  05/19/2018 0.80 0.57 - 1.00 mg/dL Final         Passed - K in normal range and within 180 days    Potassium  Date Value Ref Range Status  05/19/2018 4.2 3.5 - 5.2 mmol/L Final  11/11/2011 3.8 3.5 - 5.1 mmol/L Final         Passed - Patient is not pregnant      Passed - Last BP in normal range    BP Readings from Last 1 Encounters:  05/19/18 138/82         Passed - Valid encounter within last 6 months    Recent Outpatient Visits          2 months ago Essential hypertension   [place], Henrine Screws T, NP   6 months ago Flu vaccine need   [employer], Henrine Screws T, NP   7 months ago Essential hypertension   [place], Adriana M, PA-C   1 year ago Acute maxillary sinusitis, recurrence not specified   [place], Lilia Argue, [place]   1 year ago Mixed hyperlipidemia   [facility] Kathrine Haddock, NP      Future Appointments            In 3 months [name], Barbaraann Faster, NP [employer]           metoprolol succinate (TOPROL-XL) 25 MG 24 hr tablet [Pharmacy Med Name: METOPROLOL SUCC ER 25 MG TAB] 90 tablet 0    Sig: Take 1 tablet (25 mg total) by mouth daily.     Cardiovascular:  Beta Blockers Passed - 07/24/2018 10:30 AM      Passed - Last BP in normal range    BP Readings from Last 1 Encounters:  05/19/18 138/82         Passed - Last Heart Rate in normal range    Pulse Readings from Last 1 Encounters:  05/19/18 80         Passed - Valid encounter within last 6 months    Recent Outpatient Visits          2 months ago Essential hypertension   [place], [place] T, NP   6 months ago Flu vaccine need   [employer], Henrine Screws T, NP   7 months ago Essential hypertension   [place], PA-C   1 year ago Acute maxillary sinusitis, recurrence not specified   [facility] [employer], [place]   1 year ago Mixed hyperlipidemia   [facility] Kathrine Haddock, NP      Future Appointments            In 3 months [name], Barbaraann Faster, NP [employer], [facility]          pantoprazole (PROTONIX) 40 MG tablet [Pharmacy Med Name: PANTOPRAZOLE SOD DR 40 MG TAB] 180 tablet 0    Sig: Take 1 tablet (40 mg total) by mouth 2 (two) times daily.     Gastroenterology: Proton Pump Inhibitors Passed - 07/24/2018 10:30 AM      Passed - Valid encounter within last 12 months    Recent Outpatient Visits          2 months ago Essential hypertension   [place], Barbaraann Faster, NP   6 months ago Flu vaccine need   [place], [place] T, NP   7 months ago Essential hypertension   [place], [place], PA-C   1 year ago Acute maxillary sinusitis, recurrence not specified   [facility] [employer], [place]   1 year ago Mixed hyperlipidemia   [facility] Kathrine Haddock, NP      Future Appointments             In 3 months [name], Barbaraann Faster, NP [facility]          amLODipine (NORVASC) 5 MG tablet [Pharmacy Med Name: AMLODIPINE BESYLATE 5 MG TAB] 90 tablet 0    Sig: Take 1 tablet (5 mg total) by mouth daily.     Cardiovascular:  Calcium Channel Blockers Passed - 07/24/2018 10:30 AM      Passed - Last BP in normal range    BP Readings from Last 1 Encounters:  05/19/18 138/82         Passed - Valid encounter within last 6 months    Recent Outpatient Visits          2 months ago Essential hypertension   [place], Barbaraann Faster, NP   6 months ago Flu vaccine need   [employer], Henrine Screws T, NP   7 months ago Essential hypertension   [place] M, PA-C   1 year ago Acute maxillary sinusitis, recurrence not specified   [facility] [employer], [place]   1 year ago Mixed hyperlipidemia   [facility] Kathrine Haddock, NP      Future Appointments            In 3 months [name], Barbaraann Faster, NP [employer], [facility]            Requested Prescriptions  Pending Prescriptions Disp Refills   valsartan (DIOVAN) 160 MG tablet [Pharmacy Med Name: VALSARTAN 160 MG TABLET] 90 tablet 0    Sig: Take 1 tablet (160 mg total) by mouth daily.     Cardiovascular:  Angiotensin Receptor Blockers Passed - 07/24/2018 10:30 AM      Passed - Cr in normal range and within 180 days    Creatinine  Date Value Ref Range Status  11/11/2011 0.82 0.60 - 1.30 mg/dL Final   Creatinine, Ser  Date Value Ref Range Status  05/19/2018 0.80 0.57 - 1.00 mg/dL Final         Passed - K in normal range and within 180 days    Potassium  Date Value  Ref Range Status  05/19/2018 4.2 3.5 - 5.2 mmol/L Final  11/11/2011 3.8 3.5 - 5.1 mmol/L Final         Passed - Patient is not pregnant      Passed - Last BP in normal range    BP Readings from Last 1 Encounters:  05/19/18 138/82          Passed - Valid encounter within last 6 months    Recent Outpatient Visits          2 months ago Essential hypertension   [place], Henrine Screws T, NP   6 months ago Flu vaccine need   [employer], Henrine Screws T, NP   7 months ago Essential hypertension   [place], Adriana M, PA-C   1 year ago Acute maxillary sinusitis, recurrence not specified   [facility] [employer], [place]   1 year ago Mixed hyperlipidemia   [facility] Kathrine Haddock, NP      Future Appointments            In 3 months [name], Barbaraann Faster, NP [employer]          metoprolol succinate (TOPROL-XL) 25 MG 24 hr tablet [Pharmacy Med Name: METOPROLOL SUCC ER 25 MG TAB] 90 tablet 0    Sig: Take 1 tablet (25 mg total) by mouth daily.     Cardiovascular:  Beta Blockers Passed - 07/24/2018 10:30 AM      Passed - Last BP in normal range    BP Readings from Last 1 Encounters:  05/19/18 138/82         Passed - Last Heart Rate in normal range    Pulse Readings from Last 1 Encounters:  05/19/18 80         Passed - Valid encounter within last 6 months    Recent Outpatient Visits          2 months ago Essential hypertension   [place], [place] T, NP   6 months ago Flu vaccine need   [employer], Henrine Screws T, NP   7 months ago Essential hypertension   [place], Adriana M, PA-C   1 year ago Acute maxillary sinusitis, recurrence not specified   [facility] [employer], [place]   1 year ago Mixed hyperlipidemia   [facility] Kathrine Haddock, NP      Future Appointments            In 3 months [name], Barbaraann Faster, NP [employer]          pantoprazole (PROTONIX) 40 MG tablet [Pharmacy Med Name: PANTOPRAZOLE SOD DR 40 MG TAB] 180 tablet 0    Sig: Take 1 tablet (40 mg total) by mouth 2  (two) times daily.     Gastroenterology: Proton Pump Inhibitors Passed - 07/24/2018 10:30 AM      Passed - Valid encounter within last 12 months    Recent Outpatient Visits          2 months ago Essential hypertension   [place], Barbaraann Faster, NP   6 months ago Flu vaccine need   [place], Barbaraann Faster, NP   7 months ago Essential hypertension   [place], [place], PA-C   1 year ago Acute maxillary sinusitis, recurrence not specified   [facility] Merrie Roof [place], [place]  1 year ago Mixed hyperlipidemia   [facility] Kathrine Haddock, NP      Future Appointments            In 3 months [name], Barbaraann Faster, NP [employer]          amLODipine (NORVASC) 5 MG tablet [Pharmacy Med Name: AMLODIPINE BESYLATE 5 MG TAB] 90 tablet 0    Sig: Take 1 tablet (5 mg total) by mouth daily.     Cardiovascular:  Calcium Channel Blockers Passed - 07/24/2018 10:30 AM      Passed - Last BP in normal range    BP Readings from Last 1 Encounters:  05/19/18 138/82         Passed - Valid encounter within last 6 months    Recent Outpatient Visits          2 months ago Essential hypertension   [place], Barbaraann Faster, NP   6 months ago Flu vaccine need   [employer], Henrine Screws T, NP   7 months ago Essential hypertension   [place], PA-C   1 year ago Acute maxillary sinusitis, recurrence not specified   [facility] [employer], [place]   1 year ago Mixed hyperlipidemia   [facility] Kathrine Haddock, NP      Future Appointments            In 3 months [name], Barbaraann Faster, NP [employer]

## 2018-09-23 DIAGNOSIS — I10 Essential (primary) hypertension: Secondary | ICD-10-CM | POA: Diagnosis not present

## 2018-09-23 DIAGNOSIS — G4733 Obstructive sleep apnea (adult) (pediatric): Secondary | ICD-10-CM | POA: Diagnosis not present

## 2018-09-23 DIAGNOSIS — I519 Heart disease, unspecified: Secondary | ICD-10-CM | POA: Diagnosis not present

## 2018-09-23 DIAGNOSIS — R0602 Shortness of breath: Secondary | ICD-10-CM | POA: Diagnosis not present

## 2018-11-17 ENCOUNTER — Encounter: Payer: Self-pay | Admitting: Nurse Practitioner

## 2018-11-17 ENCOUNTER — Ambulatory Visit (INDEPENDENT_AMBULATORY_CARE_PROVIDER_SITE_OTHER): Payer: Medicare Other | Admitting: Nurse Practitioner

## 2018-11-17 ENCOUNTER — Other Ambulatory Visit: Payer: Self-pay

## 2018-11-17 VITALS — BP 132/86 | HR 71 | Temp 98.8°F | Ht 67.0 in | Wt 227.0 lb

## 2018-11-17 DIAGNOSIS — E782 Mixed hyperlipidemia: Secondary | ICD-10-CM | POA: Diagnosis not present

## 2018-11-17 DIAGNOSIS — I10 Essential (primary) hypertension: Secondary | ICD-10-CM

## 2018-11-17 DIAGNOSIS — K219 Gastro-esophageal reflux disease without esophagitis: Secondary | ICD-10-CM | POA: Diagnosis not present

## 2018-11-17 MED ORDER — PANTOPRAZOLE SODIUM 40 MG PO TBEC: DELAYED_RELEASE_TABLET | ORAL | 3 refills | Status: DC

## 2018-11-17 MED ORDER — AMLODIPINE BESYLATE 5 MG PO TABS: 5.0000 mg | ORAL_TABLET | Freq: Every day | ORAL | 3 refills | Status: DC

## 2018-11-17 MED ORDER — METOPROLOL SUCCINATE ER 25 MG PO TB24: 25.0000 mg | ORAL_TABLET | Freq: Every day | ORAL | 3 refills | Status: DC

## 2018-11-17 MED ORDER — VALSARTAN 160 MG PO TABS: ORAL_TABLET | ORAL | 3 refills | Status: DC

## 2018-11-17 NOTE — Assessment & Plan Note (Signed)
Chronic, stable with Protonix.  Continue current medication regimen.  Mag level today.

## 2018-11-17 NOTE — Patient Instructions (Signed)

## 2018-11-17 NOTE — Assessment & Plan Note (Signed)
Chronic, with initial BP slightly elevated and repeat at goal.  Home BP readings often at goal.  Continue current medication regimen and adjust as needed + collaboration with cardiology.  Continue to monitor BP at home.Marland Kitchen

## 2018-11-17 NOTE — Progress Notes (Signed)
BP 132/86 (BP Location: Left Arm)   Pulse 71   Temp 98.8 F (37.1 C) (Oral)   Ht 5\' 7"  (1.702 m)   Wt 227 lb (103 kg)   SpO2 97%   BMI 35.55 kg/m    Subjective:    Patient ID: Melissa Merritt, female    DOB: [birth year], 68 y.o.   MRN: [id number]  HPI: Melissa Merritt is a 68 y.o. female  Chief Complaint  Patient presents with  . Hypertension    46m f/u  . Hyperlipidemia  . Gastroesophageal Reflux   HYPERTENSION / HYPERLIPIDEMIA Taking Valsartan, Metoprolol, Amlodipine, ASA.  Followed by Dr. Josefa Half, last saw cardiology one month.  Refuses statin therapy, even after at length discussion about risk reduction.  Is taking red yeast rice. Satisfied with current treatment? yes Duration of hypertension: chronic BP monitoring frequency: daily BP range: 138/76 -- averages 130/70's at home BP medication side effects: no Duration of hyperlipidemia: chronic Cholesterol medication side effects: no Cholesterol supplements: red yeast rice Medication compliance: good compliance Aspirin: yes Recent stressors: no Recurrent headaches: no Visual changes: no Palpitations: no Dyspnea: no Chest pain: no Lower extremity edema: no Dizzy/lightheaded: no   The 10-year ASCVD risk score [name], et al., 2013) is: 12.7%   Values used to calculate the score:     Age: 50 years     Sex: Female     Is Non-Hispanic African American: No     Diabetic: No     Tobacco smoker: No     Systolic Blood Pressure: 998 mmHg     Is BP treated: Yes     HDL Cholesterol: 52 mg/dL     Total Cholesterol: 225 mg/dL   GERD Continues on Protonix. GERD control status: stable  Satisfied with current treatment? yes Heartburn frequency:  Medication side effects: no  Medication compliance: stable Previous GERD medications: Antacid use frequency:   Duration:  Nature:  Location:  Heartburn duration:  Alleviatiating factors:   Aggravating factors:  Dysphagia: no Odynophagia:  no  Hematemesis: no Blood in stool: no EGD: no   Relevant past medical, surgical, family and social history reviewed and updated as indicated. Interim medical history since our last visit reviewed. Allergies and medications reviewed and updated.  Review of Systems  Constitutional: Negative for activity change, appetite change, diaphoresis, fatigue and fever.  Respiratory: Negative for cough, chest tightness and shortness of breath.   Cardiovascular: Negative for chest pain, palpitations and leg swelling.  Gastrointestinal: Negative for abdominal distention, abdominal pain, constipation, diarrhea, nausea and vomiting.  Neurological: Negative for dizziness, syncope, weakness, light-headedness, numbness and headaches.  Psychiatric/Behavioral: Negative.     Per HPI unless specifically indicated above     Objective:    BP 132/86 (BP Location: Left Arm)   Pulse 71   Temp 98.8 F (37.1 C) (Oral)   Ht 5\' 7"  (1.702 m)   Wt 227 lb (103 kg)   SpO2 97%   BMI 35.55 kg/m   Wt Readings from Last 3 Encounters:  11/17/18 227 lb (103 kg)  05/19/18 224 lb (101.6 kg)  05/19/18 224 lb (101.6 kg)    Physical Exam Vitals signs and nursing note reviewed.  Constitutional:      General: She is awake. She is not in acute distress.    Appearance: She is well-developed. She is not ill-appearing.  HENT:     Head: Normocephalic.     Right Ear: Hearing normal.     Left Ear:  Hearing normal.     Nose: Nose normal.     Mouth/Throat:     Mouth: Mucous membranes are moist.  Eyes:     General: Lids are normal.        Right eye: No discharge.        Left eye: No discharge.     Conjunctiva/sclera: Conjunctivae normal.     Pupils: Pupils are equal, round, and reactive to light.  Neck:     Musculoskeletal: Normal range of motion and neck supple.     Thyroid: No thyromegaly.     Vascular: No carotid bruit.  Cardiovascular:     Rate and Rhythm: Normal rate and regular rhythm.     Heart sounds: Normal  heart sounds. No murmur. No gallop.   Pulmonary:     Effort: Pulmonary effort is normal. No accessory muscle usage or respiratory distress.     Breath sounds: Normal breath sounds.  Abdominal:     General: Bowel sounds are normal.     Palpations: Abdomen is soft. There is no hepatomegaly or splenomegaly.  Musculoskeletal:     Right lower leg: No edema.     Left lower leg: No edema.  Lymphadenopathy:     Cervical: No cervical adenopathy.  Skin:    General: Skin is warm and dry.  Neurological:     Mental Status: She is alert and oriented to person, place, and time.  Psychiatric:        Attention and Perception: Attention normal.        Mood and Affect: Mood normal.        Speech: Speech normal.        Behavior: Behavior normal. Behavior is cooperative.        Thought Content: Thought content normal.        Judgment: Judgment normal.     Results for orders placed or performed in visit on 05/19/18  Lipid Panel w/o Chol/HDL Ratio  Result Value Ref Range   Cholesterol, Total 225 (H) 100 - 199 mg/dL   Triglycerides 115 0 - 149 mg/dL   HDL 52 >39 mg/dL   VLDL Cholesterol Cal 23 5 - 40 mg/dL   LDL Calculated 150 (H) 0 - 99 mg/dL  Comprehensive metabolic panel  Result Value Ref Range   Glucose 111 (H) 65 - 99 mg/dL   BUN 12 8 - 27 mg/dL   Creatinine, Ser 0.80 0.57 - 1.00 mg/dL   GFR calc non Af Amer 75 >59 mL/min/1.73   GFR calc Af Amer 87 >59 mL/min/1.73   BUN/Creatinine Ratio 15 12 - 28   Sodium 143 134 - 144 mmol/L   Potassium 4.2 3.5 - 5.2 mmol/L   Chloride 103 96 - 106 mmol/L   CO2 26 20 - 29 mmol/L   Calcium 9.7 8.7 - 10.3 mg/dL   Total Protein 6.4 6.0 - 8.5 g/dL   Albumin 4.6 3.8 - 4.8 g/dL   Globulin, Total 1.8 1.5 - 4.5 g/dL   Albumin/Globulin Ratio 2.6 (H) 1.2 - 2.2   Bilirubin Total 0.3 0.0 - 1.2 mg/dL   Alkaline Phosphatase 106 39 - 117 IU/L   AST 19 0 - 40 IU/L   ALT 16 0 - 32 IU/L  CBC with Differential/Platelet  Result Value Ref Range   WBC 7.0 3.4 - 10.8  x10E3/uL   RBC 4.38 3.77 - 5.28 x10E6/uL   Hemoglobin 12.4 11.1 - 15.9 g/dL   Hematocrit 37.2 34.0 - 46.6 %   MCV  85 79 - 97 fL   MCH 28.3 26.6 - 33.0 pg   MCHC 33.3 31.5 - 35.7 g/dL   RDW 13.7 11.7 - 15.4 %   Platelets 265 150 - 450 x10E3/uL   Neutrophils 64 Not Estab. %   Lymphs 21 Not Estab. %   Monocytes 10 Not Estab. %   Eos 4 Not Estab. %   Basos 1 Not Estab. %   Neutrophils Absolute 4.5 1.4 - 7.0 x10E3/uL   Lymphocytes Absolute 1.4 0.7 - 3.1 x10E3/uL   Monocytes Absolute 0.7 0.1 - 0.9 x10E3/uL   EOS (ABSOLUTE) 0.3 0.0 - 0.4 x10E3/uL   Basophils Absolute 0.1 0.0 - 0.2 x10E3/uL   Immature Granulocytes 0 Not Estab. %   Immature Grans (Abs) 0.0 0.0 - 0.1 x10E3/uL      Assessment & Plan:   Problem List Items Addressed This Visit      Cardiovascular and Mediastinum   Essential hypertension - Primary    Chronic, with initial BP slightly elevated and repeat at goal.  Home BP readings often at goal.  Continue current medication regimen and adjust as needed + collaboration with cardiology.  Continue to monitor BP at home..        Relevant Medications   valsartan (DIOVAN) 160 MG tablet   metoprolol succinate (TOPROL-XL) 25 MG 24 hr tablet   amLODipine (NORVASC) 5 MG tablet   Other Relevant Orders   Comprehensive metabolic panel     Digestive   GERD (gastroesophageal reflux disease)    Chronic, stable with Protonix.  Continue current medication regimen.  Mag level today.      Relevant Medications   pantoprazole (PROTONIX) 40 MG tablet   Other Relevant Orders   Magnesium     Other   Hyperlipidemia    Chronic, ongoing.  Again reviewed ASCVD score with patient and discussed risks, she refuses statin, does not wish to take.  Continue red ueast rice and obtain labs today.  Return in 6 months.      Relevant Medications   valsartan (DIOVAN) 160 MG tablet   metoprolol succinate (TOPROL-XL) 25 MG 24 hr tablet   amLODipine (NORVASC) 5 MG tablet   Other Relevant Orders    Lipid Panel w/o Chol/HDL Ratio   Comprehensive metabolic panel       Follow up plan: Return in about 6 months (around 05/20/2019) for HTN/HLD, GERD.

## 2018-11-17 NOTE — Assessment & Plan Note (Signed)
Chronic, ongoing.  Again reviewed ASCVD score with patient and discussed risks, she refuses statin, does not wish to take.  Continue red ueast rice and obtain labs today.  Return in 6 months.

## 2018-11-18 LAB — COMPREHENSIVE METABOLIC PANEL
ALT: 18 IU/L (ref 0–32)
AST: 16 IU/L (ref 0–40)
Albumin/Globulin Ratio: 2 (ref 1.2–2.2)
Albumin: 4.5 g/dL (ref 3.8–4.8)
Alkaline Phosphatase: 100 IU/L (ref 39–117)
BUN/Creatinine Ratio: 21 (ref 12–28)
BUN: 15 mg/dL (ref 8–27)
Bilirubin Total: 0.3 mg/dL (ref 0.0–1.2)
CO2: 26 mmol/L (ref 20–29)
Calcium: 9.6 mg/dL (ref 8.7–10.3)
Chloride: 103 mmol/L (ref 96–106)
Creatinine, Ser: 0.72 mg/dL (ref 0.57–1.00)
GFR calc Af Amer: 99 mL/min/{1.73_m2} (ref >59)
GFR calc non Af Amer: 86 mL/min/{1.73_m2} (ref >59)
Globulin, Total: 2.3 g/dL (ref 1.5–4.5)
Glucose: 107 mg/dL — ABNORMAL HIGH (ref 65–99)
Potassium: 4.6 mmol/L (ref 3.5–5.2)
Sodium: 143 mmol/L (ref 134–144)
Total Protein: 6.8 g/dL (ref 6.0–8.5)

## 2018-11-18 LAB — LIPID PANEL W/O CHOL/HDL RATIO
Cholesterol, Total: 217 mg/dL — ABNORMAL HIGH (ref 100–199)
HDL: 58 mg/dL (ref >39)
LDL Calculated: 127 mg/dL — ABNORMAL HIGH (ref 0–99)
Triglycerides: 160 mg/dL — ABNORMAL HIGH (ref 0–149)
VLDL Cholesterol Cal: 32 mg/dL (ref 5–40)

## 2018-11-18 LAB — MAGNESIUM: Magnesium: 2.3 mg/dL (ref 1.6–2.3)

## 2018-12-16 DIAGNOSIS — L298 Other pruritus: Secondary | ICD-10-CM | POA: Diagnosis not present

## 2018-12-16 DIAGNOSIS — Z85828 Personal history of other malignant neoplasm of skin: Secondary | ICD-10-CM | POA: Diagnosis not present

## 2018-12-16 DIAGNOSIS — L578 Other skin changes due to chronic exposure to nonionizing radiation: Secondary | ICD-10-CM | POA: Diagnosis not present

## 2018-12-16 DIAGNOSIS — Z859 Personal history of malignant neoplasm, unspecified: Secondary | ICD-10-CM | POA: Diagnosis not present

## 2018-12-16 DIAGNOSIS — L570 Actinic keratosis: Secondary | ICD-10-CM | POA: Diagnosis not present

## 2019-02-24 ENCOUNTER — Other Ambulatory Visit: Payer: Self-pay

## 2019-02-24 ENCOUNTER — Ambulatory Visit (INDEPENDENT_AMBULATORY_CARE_PROVIDER_SITE_OTHER): Payer: Medicare Other

## 2019-02-24 DIAGNOSIS — Z23 Encounter for immunization: Secondary | ICD-10-CM

## 2019-04-10 HISTORY — PX: BREAST LUMPECTOMY: SHX2

## 2019-05-25 ENCOUNTER — Encounter: Payer: Self-pay | Admitting: Nurse Practitioner

## 2019-05-25 ENCOUNTER — Ambulatory Visit (INDEPENDENT_AMBULATORY_CARE_PROVIDER_SITE_OTHER): Payer: Medicare Other | Admitting: Nurse Practitioner

## 2019-05-25 ENCOUNTER — Ambulatory Visit (INDEPENDENT_AMBULATORY_CARE_PROVIDER_SITE_OTHER): Payer: Medicare Other

## 2019-05-25 ENCOUNTER — Other Ambulatory Visit: Payer: Self-pay

## 2019-05-25 VITALS — BP 147/87 | HR 66 | Temp 98.0°F | Ht 66.0 in | Wt 231.4 lb

## 2019-05-25 DIAGNOSIS — K219 Gastro-esophageal reflux disease without esophagitis: Secondary | ICD-10-CM

## 2019-05-25 DIAGNOSIS — G4733 Obstructive sleep apnea (adult) (pediatric): Secondary | ICD-10-CM | POA: Diagnosis not present

## 2019-05-25 DIAGNOSIS — Z6837 Body mass index (BMI) 37.0-37.9, adult: Secondary | ICD-10-CM | POA: Diagnosis not present

## 2019-05-25 DIAGNOSIS — Z1231 Encounter for screening mammogram for malignant neoplasm of breast: Secondary | ICD-10-CM

## 2019-05-25 DIAGNOSIS — D1431 Benign neoplasm of right bronchus and lung: Secondary | ICD-10-CM

## 2019-05-25 DIAGNOSIS — Z0000 Encounter for general adult medical examination without abnormal findings: Secondary | ICD-10-CM

## 2019-05-25 DIAGNOSIS — I10 Essential (primary) hypertension: Secondary | ICD-10-CM

## 2019-05-25 DIAGNOSIS — E782 Mixed hyperlipidemia: Secondary | ICD-10-CM

## 2019-05-25 NOTE — Progress Notes (Signed)
Subjective:   Melissa Merritt is a 69 y.o. female who presents for Medicare Annual (Subsequent) preventive examination.  This visit is being conducted via phone call  - after an attmept to do on video chat - due to the COVID-19 pandemic. This patient has given me verbal consent via phone to conduct this visit, patient states they are participating from their home address. Some vital signs may be absent or patient reported.   Patient identification: identified by name, DOB, and current address.    Review of Systems:   Cardiac Risk Factors include: advanced age (>1men, >62 women);dyslipidemia;hypertension     Objective:     Vitals: BP (!) 147/87 (BP Location: Left Arm, Patient Position: Sitting, Cuff Size: Large)   Pulse 66   Temp 98 F (36.7 C) (Oral)   Ht 5\' 6"  (1.676 m)   SpO2 96%   BMI 36.64 kg/m   Body mass index is 36.64 kg/m.  Advanced Directives 05/25/2019 05/17/2017 05/23/2016 05/23/2015 05/23/2015  Does Patient Have a Medical Advance Directive? Yes Yes No;Yes Yes Yes  Type of Advance Directive Living will;Healthcare Power of [place];Living will [place];Living will [place];Living will [place];Living will  Does patient want to make changes to medical advance directive? - - - No - Patient declined -  Copy of [place] in Chart? No - copy requested No - copy requested - No - copy requested -    Tobacco Social History   Tobacco Use  Smoking Status Former Smoker  . Quit date: 07/09/1979  . Years since quitting: 39.9  Smokeless Tobacco Never Used     Counseling given: Not Answered   Clinical Intake:  Pre-visit preparation completed: Yes  Pain : No/denies pain     Nutritional Risks: None  How often do you need to have someone help you when you read instructions, pamphlets, or other written materials from your doctor or pharmacy?: 1 -  Never  Interpreter Needed?: No  Information entered by :: Kimbree Casanas,LPN  Past Medical History:  Diagnosis Date  . Benign tumor of lung   . CHF (congestive heart failure) ([place])   . GERD (gastroesophageal reflux disease)   . Hyperlipidemia   . Hypertension   . Insomnia   . Osteopenia   . Plantar fasciitis   . S/P pneumonectomy    Past Surgical History:  Procedure Laterality Date  . bronchoscopy    . CATARACT EXTRACTION, BILATERAL     Left- 11/21/15, right- 12/23/15  . CHOLECYSTECTOMY    . CYSTECTOMY  11/03/09   cheek  . EYE SURGERY    . FOOT SURGERY Bilateral 2010  . LOBECTOMY    . LUNG SURGERY Right   . UPPER GI ENDOSCOPY  2005   Family History  Problem Relation Age of Onset  . COPD Mother   . Hypertension Mother   . Emphysema Mother   . Osteoporosis Mother   . Heart disease Father   . Hyperlipidemia Sister   . Hyperlipidemia Brother   . Heart disease Maternal Grandmother   . Heart disease Maternal Grandfather   . Heart disease Paternal Grandmother   . Stroke Paternal Grandmother   . Heart disease Paternal Grandfather   . Stroke Paternal Grandfather   . Cancer Maternal Aunt        breast and lung  . Osteoporosis Maternal Aunt   . Aortic aneurysm Maternal Aunt   . Breast cancer Maternal  Aunt 76   Social History   Socioeconomic History  . Marital status: Married    Spouse name: Not on file  . Number of children: Not on file  . Years of education: Not on file  . Highest education level: Some college, no degree  Occupational History  . Occupation: retired   Tobacco Use  . Smoking status: Former Smoker    Quit date: 07/09/1979    Years since quitting: 39.9  . Smokeless tobacco: Never Used  Substance and Sexual Activity  . Alcohol use: No  . Drug use: No  . Sexual activity: Yes  Other Topics Concern  . Not on file  Social History Narrative  . Not on file   Social Determinants of Health   Financial Resource Strain:   . Difficulty of Paying Living  Expenses: Not on file  Food Insecurity:   . Worried About Charity fundraiser in the Last Year: Not on file  . Ran Out of Food in the Last Year: Not on file  Transportation Needs:   . Lack of Transportation (Medical): Not on file  . Lack of Transportation (Non-Medical): Not on file  Physical Activity:   . Days of Exercise per Week: Not on file  . Minutes of Exercise per Session: Not on file  Stress:   . Feeling of Stress : Not on file  Social Connections:   . Frequency of Communication with Friends and Family: Not on file  . Frequency of Social Gatherings with Friends and Family: Not on file  . Attends Religious Services: Not on file  . Active Member of Clubs or Organizations: Not on file  . Attends Archivist Meetings: Not on file  . Marital Status: Not on file    Outpatient Encounter Medications as of 05/25/2019  Medication Sig  . acetaminophen (TYLENOL) 500 MG tablet Take 500 mg by mouth every 6 (six) hours as needed. Alternate with ibu for jaw pain  . amLODipine (NORVASC) 5 MG tablet Take 1 tablet (5 mg total) by mouth daily.  Marland Kitchen aspirin 81 MG tablet Take 81 mg by mouth daily.  . Biotin 5000 MCG TABS Take 5,000 mcg by mouth 2 (two) times daily.  . Calcium Carb-Cholecalciferol (CALCIUM-VITAMIN D) 600-400 MG-UNIT TABS Take 1 tablet by mouth 2 (two) times daily.  . Cyanocobalamin (B-12) 2500 MCG TABS Take 2,500 mcg by mouth daily.  . Ibuprofen (IBU-200 PO) Take by mouth. Alternates with tylenol currently for jaw pain  . metoprolol succinate (TOPROL-XL) 25 MG 24 hr tablet Take 1 tablet (25 mg total) by mouth daily.  . Nutritional Supplements (VITAMIN D BOOSTER PO) Take by mouth.  . pantoprazole (PROTONIX) 40 MG tablet Take 1 tablet (40 mg total) by mouth 2 (two) times daily.  . [place] Johns Wort 300 MG CAPS Take 300 mg by mouth 2 (two) times daily.  Marland Kitchen UNABLE TO FIND Red yeast rice orally. Twice a day tablet  . valsartan (DIOVAN) 160 MG tablet Take 1 tablet (160 mg total) by  mouth daily.  Marland Kitchen erythromycin [facility]) ophthalmic ointment Place 1 application into the right eye at bedtime. (Patient not taking: Reported on 05/25/2019)   No facility-administered encounter medications on file as of 05/25/2019.    Activities of Daily Living In your present state of health, do you have any difficulty performing the following activities: 05/25/2019  Hearing? N  Comment no hearing aids  Vision? N  Comment eyeglasses, goes to Dr[facility]  Difficulty concentrating or making decisions? N  Walking or climbing stairs? N  Dressing or bathing? N  Doing errands, shopping? N  Preparing Food and eating ? N  Using the Toilet? N  In the past six months, have you accidently leaked urine? Y  Comment depends at night  Do you have problems with loss of bowel control? N  Managing your Medications? N  Managing your Finances? N  Housekeeping or managing your Housekeeping? N  Some recent data might be hidden    Patient Care Team: Venita Lick, NP as PCP - General (Nurse Practitioner) Idelle Leech, OD (Optometry) Isaias Cowman, MD as Consulting Physician (Cardiology) Manya Silvas, MD (Inactive) (Gastroenterology) Lucky Cowboy Erskine Squibb, MD as Referring Physician (Vascular Surgery) Clabe Seal, PA-C Jannet Mantis, MD (Dermatology)    Assessment:   This is a routine wellness examination for Melissa Merritt.  Exercise Activities and Dietary recommendations Current Exercise Habits: The patient does not participate in regular exercise at present, Exercise limited by: None identified  Goals    . DIET - INCREASE WATER INTAKE     Recommend drinking at least 6-8 glasses of water a day        Fall Risk: Fall Risk  05/25/2019 05/19/2018 05/17/2017 05/23/2016 05/23/2015  Falls in the past year? 0 0 Yes Yes Yes  Number falls in past yr: 0 0 1 2 or more 1  Injury with Fall? 0 - No No No    FALL RISK PREVENTION PERTAINING TO THE HOME:  Any stairs in or around the home? Yes   If so,  are there any without handrails? No   Home free of loose throw rugs in walkways, pet beds, electrical cords, etc? Yes  Adequate lighting in your home to reduce risk of falls? Yes   ASSISTIVE DEVICES UTILIZED TO PREVENT FALLS:  Life alert? No  Use of a cane, walker or w/c? No  Grab bars in the bathroom? No  Shower chair or bench in shower? No  Elevated toilet seat or a handicapped toilet? Yes chair   DME ORDERS:  DME order needed?  No   TIMED UP AND GO:  Was the test performed? Yes .  Length of time to ambulate 10 feet: 8 sec.   GAIT:  Appearance of gait: Gait steady and fast without the use of an assistive device.   Education: Fall risk prevention has been discussed.  Intervention(s) required? No   DME/home health order needed?  No    Depression Screen PHQ 2/9 Scores 05/25/2019 05/19/2018 05/17/2017 05/23/2016  PHQ - 2 Score 0 0 0 0  PHQ- 9 Score - - - 1     Cognitive Function     6CIT Screen 05/25/2019 05/19/2018 05/17/2017  What Year? 0 points 0 points 0 points  What month? 0 points 0 points 0 points  What time? 0 points 0 points 0 points  Count back from 20 0 points 0 points 0 points  Months in reverse 0 points 0 points 0 points  Repeat phrase 0 points 0 points 0 points  Total Score 0 0 0    Immunization History  Administered Date(s) Administered  . Fluad Quad(high Dose 65+) 02/24/2019  . Influenza, High Dose Seasonal PF 12/27/2015, 01/15/2018  . Influenza,inj,Quad PF,6+ Mos 01/21/2015  . Influenza-Unspecified 02/08/2017  . Pneumococcal Conjugate-13 05/19/2014  . Pneumococcal Polysaccharide-23 01/14/2008, 05/23/2015  . Td 09/11/2005  . Tdap 12/27/2015  . Zoster 09/04/2011    Qualifies for Shingles Vaccine? Yes  Zostavax completed n/a. Due for Shingrix. Education  has been provided regarding the importance of this vaccine. Pt has been advised to call insurance company to determine out of pocket expense. Advised may also receive vaccine at local pharmacy or Health  Dept. Verbalized acceptance and understanding.  Tdap: up to date  Flu Vaccine: up to date   Pneumococcal Vaccine: up to date   Covid-19: waitlist   Screening Tests Health Maintenance  Topic Date Due  . COLONOSCOPY  10/20/2018  . MAMMOGRAM  05/22/2020  . TETANUS/TDAP  12/26/2025  . INFLUENZA VACCINE  Completed  . DEXA SCAN  Completed  . Hepatitis C Screening  Completed  . PNA vac Low Risk Adult  Completed    Cancer Screenings:  Colorectal Screening: Completed 10/19/2013. Repeat every 5 years.   Mammogram: Completed 05/22/2018.  Repeat every year;   Bone Density: Completed 03/24/2014.   Lung Cancer Screening: (Low Dose CT Chest recommended if Age 29-80 years, 30 pack-year currently smoking OR have quit w/in 15years.) does not qualify.     Additional Screening:  Hepatitis C Screening: does qualify; Completed 2017  Vision Screening: Recommended annual ophthalmology exams for early detection of glaucoma and other disorders of the eye. Is the patient up to date with their annual eye exam?  Yes  Who is the provider or what is the name of the office in which the pt attends annual eye exams? [facility]   Dental Screening: Recommended annual dental exams for proper oral hygiene  Community Resource Referral:  CRR required this visit?  No       Plan:  I have personally reviewed and addressed the Medicare Annual Wellness questionnaire and have noted the following in the patient's chart:  A. Medical and social history B. Use of alcohol, tobacco or illicit drugs  C. Current medications and supplements D. Functional ability and status E.  Nutritional status F.  Physical activity G. Advance directives H. List of other physicians I.  Hospitalizations, surgeries, and ER visits in previous 12 months J.  [place] such as hearing and vision if needed, cognitive and depression L. Referrals and appointments   In addition, I have reviewed and discussed with patient certain  preventive protocols, quality metrics, and best practice recommendations. A written personalized care plan for preventive services as well as general preventive health recommendations were provided to patient.  Signed,    Bevelyn Ngo, LPN  [id number] Nurse Health Advisor   Nurse Notes: none

## 2019-05-25 NOTE — Progress Notes (Signed)
BP (!) 147/87   Pulse 66   Temp 98 F (36.7 C) (Oral)   Ht 5\' 6"  (1.676 m)   Wt 231 lb 6.4 oz (105 kg)   SpO2 96%   BMI 37.35 kg/m    Subjective:    Patient ID: Melissa Merritt, female    DOB: 1950-05-14, 69 y.o.   MRN: [id number]  HPI: Melissa Merritt is a 69 y.o. female  Chief Complaint  Patient presents with  . Hyperlipidemia  . Hypertension  . Gastroesophageal Reflux   HYPERTENSION / HYPERLIPIDEMIA Taking Valsartan, Metoprolol, Amlodipine, ASA.  Followed by Dr. Josefa Half, last saw 03/26/2019.  Last echo 10/19/16 with normal LV function with LVEF 50% with mild mitral regurgitation.  Refuses statin therapy, even after at length discussion about risk reduction.  Is taking red yeast rice.  Has a toothache, has had since Wednesday.  Sees dentist today at 1430.   Satisfied with current treatment? yes Duration of hypertension: chronic BP monitoring frequency: a few times a week BP range: 130/70's at home BP medication side effects: no Duration of hyperlipidemia: chronic Cholesterol medication side effects: no Cholesterol supplements: red yeast rice Medication compliance: good compliance Aspirin: yes Recent stressors: no Recurrent headaches: no Visual changes: no Palpitations: no Dyspnea: no Chest pain: no Lower extremity edema: no Dizzy/lightheaded: no  The 10-year ASCVD risk score [name], et al., 2013) is: 16.5%   Values used to calculate the score:     Age: 75 years     Sex: Female     Is Non-Hispanic African American: No     Diabetic: No     Tobacco smoker: No     Systolic Blood Pressure: [id number] mmHg     Is BP treated: Yes     HDL Cholesterol: 58 mg/dL     Total Cholesterol: 217 mg/dL  SLEEP APNEA Had sleep study several years ago, but had a panic attack during this.  Does not wear CPAP due to concern for mask and panic.   Sleep apnea status: uncontrolled Duration: chronic Satisfied with current treatment?:  yes CPAP use:  no Last  sleep study: several years ago Treatments attempted: none Wakes feeling refreshed:  no Daytime hypersomnolence:  yes Fatigue:  yes Insomnia:  yes Good sleep hygiene:  yes Difficulty falling asleep:  yes Difficulty staying asleep:  yes Snoring bothers bed partner:  yes Observed apnea by bed partner: yes Obesity:  yes Hypertension: yes  Pulmonary hypertension:  no Coronary artery disease:  no  GERD Continues on Protonix. GERD control status: stable  Satisfied with current treatment? yes Heartburn frequency: none Medication side effects: no  Medication compliance: stable Previous GERD medications: TUMS Antacid use frequency:  none Alleviatiating factors:  Protonix Aggravating factors: certain foods Dysphagia: no Odynophagia:  no Hematemesis: no Blood in stool: no EGD: yes  Due for yearly CXR to assess benign tumor of lung, would like this ordered today.  Relevant past medical, surgical, family and social history reviewed and updated as indicated. Interim medical history since our last visit reviewed. Allergies and medications reviewed and updated.  Review of Systems  Constitutional: Negative for activity change, appetite change, diaphoresis, fatigue and fever.  Respiratory: Negative for cough, chest tightness and shortness of breath.   Cardiovascular: Negative for chest pain, palpitations and leg swelling.  Gastrointestinal: Negative.   Endocrine: Negative.   Psychiatric/Behavioral: Negative.     Per HPI unless specifically indicated above     Objective:    BP Marland Kitchen)  147/87   Pulse 66   Temp 98 F (36.7 C) (Oral)   Ht 5\' 6"  (1.676 m)   Wt 231 lb 6.4 oz (105 kg)   SpO2 96%   BMI 37.35 kg/m   Wt Readings from Last 3 Encounters:  05/25/19 231 lb 6.4 oz (105 kg)  05/25/19 231 lb 6.4 oz (105 kg)  11/17/18 227 lb (103 kg)    Physical Exam Vitals and nursing note reviewed.  Constitutional:      General: She is awake. She is not in acute distress.     Appearance: She is well-developed. She is obese. She is not ill-appearing.  HENT:     Head: Normocephalic.     Right Ear: Hearing normal.     Left Ear: Hearing normal.  Eyes:     General: Lids are normal.        Right eye: No discharge.        Left eye: No discharge.     Conjunctiva/sclera: Conjunctivae normal.     Pupils: Pupils are equal, round, and reactive to light.  Neck:     Thyroid: No thyromegaly.     Vascular: No carotid bruit.  Cardiovascular:     Rate and Rhythm: Normal rate and regular rhythm.     Heart sounds: Normal heart sounds. No murmur. No gallop.   Pulmonary:     Effort: Pulmonary effort is normal. No accessory muscle usage or respiratory distress.     Breath sounds: Normal breath sounds.  Abdominal:     General: Bowel sounds are normal.     Palpations: Abdomen is soft.  Musculoskeletal:     Cervical back: Normal range of motion and neck supple.     Right lower leg: No edema.     Left lower leg: No edema.  Skin:    General: Skin is warm and dry.  Neurological:     Mental Status: She is alert and oriented to person, place, and time.  Psychiatric:        Attention and Perception: Attention normal.        Mood and Affect: Mood normal.        Speech: Speech normal.        Behavior: Behavior normal. Behavior is cooperative.     Results for orders placed or performed in visit on 11/17/18  Lipid Panel w/o Chol/HDL Ratio  Result Value Ref Range   Cholesterol, Total 217 (H) 100 - 199 mg/dL   Triglycerides 160 (H) 0 - 149 mg/dL   HDL 58 >39 mg/dL   VLDL Cholesterol Cal 32 5 - 40 mg/dL   LDL Calculated 127 (H) 0 - 99 mg/dL  Comprehensive metabolic panel  Result Value Ref Range   Glucose 107 (H) 65 - 99 mg/dL   BUN 15 8 - 27 mg/dL   Creatinine, Ser 0.72 0.57 - 1.00 mg/dL   GFR calc non Af Amer 86 >59 mL/min/1.73   GFR calc Af Amer 99 >59 mL/min/1.73   BUN/Creatinine Ratio 21 12 - 28   Sodium 143 134 - 144 mmol/L   Potassium 4.6 3.5 - 5.2 mmol/L    Chloride 103 96 - 106 mmol/L   CO2 26 20 - 29 mmol/L   Calcium 9.6 8.7 - 10.3 mg/dL   Total Protein 6.8 6.0 - 8.5 g/dL   Albumin 4.5 3.8 - 4.8 g/dL   Globulin, Total 2.3 1.5 - 4.5 g/dL   Albumin/Globulin Ratio 2.0 1.2 - 2.2   Bilirubin Total 0.3  0.0 - 1.2 mg/dL   Alkaline Phosphatase 100 39 - 117 IU/L   AST 16 0 - 40 IU/L   ALT 18 0 - 32 IU/L  Magnesium  Result Value Ref Range   Magnesium 2.3 1.6 - 2.3 mg/dL      Assessment & Plan:   Problem List Items Addressed This Visit      Cardiovascular and Mediastinum   Essential hypertension    Chronic, with initial BP slightly elevated today in office due to toothache.  Home BP readings often at goal.  Continue current medication regimen and adjust as needed + collaboration with cardiology.  Continue to monitor BP at home regularly.  CMP today.  Return in 6 months for annual physical.      Relevant Orders   Comprehensive metabolic panel     Respiratory   Obstructive apnea    Referral for new sleep study placed.  Recommend she utilize CPAP if prescribed.      Relevant Orders   Ambulatory referral to Sleep Studies   Benign tumor of lung    Yearly CXR ordered.      Relevant Orders   DG Chest 2 View     Digestive   GERD (gastroesophageal reflux disease)    Chronic, stable with Protonix.  Continue current medication regimen.  Mag level next visit and annually.  Trial time off in future and assess if tolerated.        Other   Hyperlipidemia    Chronic, ongoing.  Again reviewed ASCVD score with patient and discussed risks, she refuses statin, does not wish to take.  Continue red yeast rice and obtain labs today.  Return in 6 months.      Relevant Orders   Comprehensive metabolic panel   Lipid Panel w/o Chol/HDL Ratio   Obesity - Primary    Recommend continued focus on healthy diet choices and regular physical activity (30 minutes 5 days a week).           Follow up plan: Return in about 6 months (around 11/22/2019)  for Annual physical.

## 2019-05-25 NOTE — Assessment & Plan Note (Signed)
Referral for new sleep study placed.  Recommend she utilize CPAP if prescribed.

## 2019-05-25 NOTE — Assessment & Plan Note (Signed)
Chronic, ongoing.  Again reviewed ASCVD score with patient and discussed risks, she refuses statin, does not wish to take.  Continue red yeast rice and obtain labs today.  Return in 6 months. 

## 2019-05-25 NOTE — Patient Instructions (Signed)
DASH Eating Plan DASH stands for "Dietary Approaches to Stop Hypertension." The DASH eating plan is a healthy eating plan that has been shown to reduce high blood pressure (hypertension). It may also reduce your risk for type 2 diabetes, heart disease, and stroke. The DASH eating plan may also help with weight loss. What are tips for following this plan?  General guidelines  Avoid eating more than 2,300 mg (milligrams) of salt (sodium) a day. If you have hypertension, you may need to reduce your sodium intake to 1,500 mg a day.  Limit alcohol intake to no more than 1 drink a day for nonpregnant women and 2 drinks a day for men. One drink equals 12 oz of beer, 5 oz of wine, or 1 oz of hard liquor.  Work with your health care provider to maintain a healthy body weight or to lose weight. Ask what an ideal weight is for you.  Get at least 30 minutes of exercise that causes your heart to beat faster (aerobic exercise) most days of the week. Activities may include walking, swimming, or biking.  Work with your health care provider or diet and nutrition specialist (dietitian) to adjust your eating plan to your individual calorie needs. Reading food labels   Check food labels for the amount of sodium per serving. Choose foods with less than 5 percent of the Daily Value of sodium. Generally, foods with less than 300 mg of sodium per serving fit into this eating plan.  To find whole grains, look for the word "whole" as the first word in the ingredient list. Shopping  Buy products labeled as "low-sodium" or "no salt added."  Buy fresh foods. Avoid canned foods and premade or frozen meals. Cooking  Avoid adding salt when cooking. Use salt-free seasonings or herbs instead of table salt or sea salt. Check with your health care provider or pharmacist before using salt substitutes.  Do not fry foods. Cook foods using healthy methods such as baking, boiling, grilling, and broiling instead.  Cook with  heart-healthy oils, such as olive, canola, soybean, or sunflower oil. Meal planning  Eat a balanced diet that includes: ? 5 or more servings of fruits and vegetables each day. At each meal, try to fill half of your plate with fruits and vegetables. ? Up to 6-8 servings of whole grains each day. ? Less than 6 oz of lean meat, poultry, or fish each day. A 3-oz serving of meat is about the same size as a deck of cards. One egg equals 1 oz. ? 2 servings of low-fat dairy each day. ? A serving of nuts, seeds, or beans 5 times each week. ? Heart-healthy fats. Healthy fats called Omega-3 fatty acids are found in foods such as flaxseeds and coldwater fish, like sardines, salmon, and mackerel.  Limit how much you eat of the following: ? Canned or prepackaged foods. ? Food that is high in trans fat, such as fried foods. ? Food that is high in saturated fat, such as fatty meat. ? Sweets, desserts, sugary drinks, and other foods with added sugar. ? Full-fat dairy products.  Do not salt foods before eating.  Try to eat at least 2 vegetarian meals each week.  Eat more home-cooked food and less restaurant, buffet, and fast food.  When eating at a restaurant, ask that your food be prepared with less salt or no salt, if possible. What foods are recommended? The items listed may not be a complete list. Talk with your dietitian about   what dietary choices are best for you. Grains Whole-grain or whole-wheat bread. Whole-grain or whole-wheat pasta. Brown rice. Oatmeal. Quinoa. Bulgur. Whole-grain and low-sodium cereals. Pita bread. Low-fat, low-sodium crackers. Whole-wheat flour tortillas. Vegetables Fresh or frozen vegetables (raw, steamed, roasted, or grilled). Low-sodium or reduced-sodium tomato and vegetable juice. Low-sodium or reduced-sodium tomato sauce and tomato paste. Low-sodium or reduced-sodium canned vegetables. Fruits All fresh, dried, or frozen fruit. Canned fruit in natural juice (without  added sugar). Meat and other protein foods Skinless chicken or turkey. Ground chicken or turkey. Pork with fat trimmed off. Fish and seafood. Egg whites. Dried beans, peas, or lentils. Unsalted nuts, nut butters, and seeds. Unsalted canned beans. Lean cuts of beef with fat trimmed off. Low-sodium, lean deli meat. Dairy Low-fat (1%) or fat-free (skim) milk. Fat-free, low-fat, or reduced-fat cheeses. Nonfat, low-sodium ricotta or cottage cheese. Low-fat or nonfat yogurt. Low-fat, low-sodium cheese. Fats and oils Soft margarine without trans fats. Vegetable oil. Low-fat, reduced-fat, or light mayonnaise and salad dressings (reduced-sodium). Canola, safflower, olive, soybean, and sunflower oils. Avocado. Seasoning and other foods Herbs. Spices. Seasoning mixes without salt. Unsalted popcorn and pretzels. Fat-free sweets. What foods are not recommended? The items listed may not be a complete list. Talk with your dietitian about what dietary choices are best for you. Grains Baked goods made with fat, such as croissants, muffins, or some breads. Dry pasta or rice meal packs. Vegetables Creamed or fried vegetables. Vegetables in a cheese sauce. Regular canned vegetables (not low-sodium or reduced-sodium). Regular canned tomato sauce and paste (not low-sodium or reduced-sodium). Regular tomato and vegetable juice (not low-sodium or reduced-sodium). Pickles. Olives. Fruits Canned fruit in a light or heavy syrup. Fried fruit. Fruit in cream or butter sauce. Meat and other protein foods Fatty cuts of meat. Ribs. Fried meat. Bacon. Sausage. Bologna and other processed lunch meats. Salami. Fatback. Hotdogs. Bratwurst. Salted nuts and seeds. Canned beans with added salt. Canned or smoked fish. Whole eggs or egg yolks. Chicken or turkey with skin. Dairy Whole or 2% milk, cream, and half-and-half. Whole or full-fat cream cheese. Whole-fat or sweetened yogurt. Full-fat cheese. Nondairy creamers. Whipped toppings.  Processed cheese and cheese spreads. Fats and oils Butter. Stick margarine. Lard. Shortening. Ghee. Bacon fat. Tropical oils, such as coconut, palm kernel, or palm oil. Seasoning and other foods Salted popcorn and pretzels. Onion salt, garlic salt, seasoned salt, table salt, and sea salt. Worcestershire sauce. Tartar sauce. Barbecue sauce. Teriyaki sauce. Soy sauce, including reduced-sodium. Steak sauce. Canned and packaged gravies. Fish sauce. Oyster sauce. Cocktail sauce. Horseradish that you find on the shelf. Ketchup. Mustard. Meat flavorings and tenderizers. Bouillon cubes. Hot sauce and Tabasco sauce. Premade or packaged marinades. Premade or packaged taco seasonings. Relishes. Regular salad dressings. Where to find more information:  National Heart, Lung, and Blood Institute: www.nhlbi.nih.gov  American Heart Association: www.heart.org Summary  The DASH eating plan is a healthy eating plan that has been shown to reduce high blood pressure (hypertension). It may also reduce your risk for type 2 diabetes, heart disease, and stroke.  With the DASH eating plan, you should limit salt (sodium) intake to 2,300 mg a day. If you have hypertension, you may need to reduce your sodium intake to 1,500 mg a day.  When on the DASH eating plan, aim to eat more fresh fruits and vegetables, whole grains, lean proteins, low-fat dairy, and heart-healthy fats.  Work with your health care provider or diet and nutrition specialist (dietitian) to adjust your eating plan to your   individual calorie needs. This information is not intended to replace advice given to you by your health care provider. Make sure you discuss any questions you have with your health care provider. Document Revised: 03/08/2017 Document Reviewed: 03/19/2016 Elsevier Patient Education  2020 [employer]  

## 2019-05-25 NOTE — Assessment & Plan Note (Signed)
Recommend continued focus on healthy diet choices and regular physical activity (30 minutes 5 days a week).  

## 2019-05-25 NOTE — Patient Instructions (Signed)
[name] , Thank you for taking time to come for your Medicare Wellness Visit. I appreciate your ongoing commitment to your health goals. Please review the following plan we discussed and let me know if I can assist you in the future.   Screening recommendations/referrals: Colonoscopy: please call and schedule with GI  Mammogram: Please call [phone] to schedule your mammogram.  Bone Density: up to date  Recommended yearly ophthalmology/optometry visit for glaucoma screening and checkup Recommended yearly dental visit for hygiene and checkup  Vaccinations: Influenza vaccine: up to date  Pneumococcal vaccine: up to date Tdap vaccine: up to date  Shingles vaccine: shingrix eligible Covid-19: on waitlist    Advanced directives: Please bring a copy of your health care power of attorney and living will to the office at your convenience.  Conditions/risks identified: none   Next appointment: Follow up in one year for your annual wellness visit    Preventive Care 65 Years and Older, Female Preventive care refers to lifestyle choices and visits with your health care provider that can promote health and wellness. What does preventive care include?  A yearly physical exam. This is also called an annual well check.  Dental exams once or twice a year.  Routine eye exams. Ask your health care provider how often you should have your eyes checked.  Personal lifestyle choices, including:  Daily care of your teeth and gums.  Regular physical activity.  Eating a healthy diet.  Avoiding tobacco and drug use.  Limiting alcohol use.  Practicing safe sex.  Taking low-dose aspirin every day.  Taking vitamin and mineral supplements as recommended by your health care provider. What happens during an annual well check? The services and screenings done by your health care provider during your annual well check will depend on your age, overall health, lifestyle risk factors, and family  history of disease. Counseling  Your health care provider may ask you questions about your:  Alcohol use.  Tobacco use.  Drug use.  Emotional well-being.  Home and relationship well-being.  Sexual activity.  Eating habits.  History of falls.  Memory and ability to understand (cognition).  Work and work Statistician.  Reproductive health. Screening  You may have the following tests or measurements:  Height, weight, and BMI.  Blood pressure.  Lipid and cholesterol levels. These may be checked every 5 years, or more frequently if you are over 95 years old.  Skin check.  Lung cancer screening. You may have this screening every year starting at age 59 if you have a 30-pack-year history of smoking and currently smoke or have quit within the past 15 years.  Fecal occult blood test (FOBT) of the stool. You may have this test every year starting at age 58.  Flexible sigmoidoscopy or colonoscopy. You may have a sigmoidoscopy every 5 years or a colonoscopy every 10 years starting at age 33.  Hepatitis C blood test.  Hepatitis B blood test.  Sexually transmitted disease (STD) testing.  Diabetes screening. This is done by checking your blood sugar (glucose) after you have not eaten for a while (fasting). You may have this done every 1-3 years.  Bone density scan. This is done to screen for osteoporosis. You may have this done starting at age 6.  Mammogram. This may be done every 1-2 years. Talk to your health care provider about how often you should have regular mammograms. Talk with your health care provider about your test results, treatment options, and if necessary, the need  for more tests. Vaccines  Your health care provider may recommend certain vaccines, such as:  Influenza vaccine. This is recommended every year.  Tetanus, diphtheria, and acellular pertussis (Tdap, Td) vaccine. You may need a Td booster every 10 years.  Zoster vaccine. You may need this after  age 82.  Pneumococcal 13-valent conjugate (PCV13) vaccine. One dose is recommended after age 59.  Pneumococcal polysaccharide (PPSV23) vaccine. One dose is recommended after age 64. Talk to your health care provider about which screenings and vaccines you need and how often you need them. This information is not intended to replace advice given to you by your health care provider. Make sure you discuss any questions you have with your health care provider. Document Released: 04/22/2015 Document Revised: 12/14/2015 Document Reviewed: 01/25/2015 Elsevier Interactive Patient Education  2017 [place] Prevention in the Home Falls can cause injuries. They can happen to people of all ages. There are many things you can do to make your home safe and to help prevent falls. What can I do on the outside of my home?  Regularly fix the edges of walkways and driveways and fix any cracks.  Remove anything that might make you trip as you walk through a door, such as a raised step or threshold.  Trim any bushes or trees on the path to your home.  Use bright outdoor lighting.  Clear any walking paths of anything that might make someone trip, such as rocks or tools.  Regularly check to see if handrails are loose or broken. Make sure that both sides of any steps have handrails.  Any raised decks and porches should have guardrails on the edges.  Have any leaves, snow, or ice cleared regularly.  Use sand or salt on walking paths during winter.  Clean up any spills in your garage right away. This includes oil or grease spills. What can I do in the bathroom?  Use night lights.  Install grab bars by the toilet and in the tub and shower. Do not use towel bars as grab bars.  Use non-skid mats or decals in the tub or shower.  If you need to sit down in the shower, use a plastic, non-slip stool.  Keep the floor dry. Clean up any water that spills on the floor as soon as it happens.   Remove soap buildup in the tub or shower regularly.  Attach bath mats securely with double-sided non-slip rug tape.  Do not have throw rugs and other things on the floor that can make you trip. What can I do in the bedroom?  Use night lights.  Make sure that you have a light by your bed that is easy to reach.  Do not use any sheets or blankets that are too big for your bed. They should not hang down onto the floor.  Have a firm chair that has side arms. You can use this for support while you get dressed.  Do not have throw rugs and other things on the floor that can make you trip. What can I do in the kitchen?  Clean up any spills right away.  Avoid walking on wet floors.  Keep items that you use a lot in easy-to-reach places.  If you need to reach something above you, use a strong step stool that has a grab bar.  Keep electrical cords out of the way.  Do not use floor polish or wax that makes floors slippery. If you must use wax, use  non-skid floor wax.  Do not have throw rugs and other things on the floor that can make you trip. What can I do with my stairs?  Do not leave any items on the stairs.  Make sure that there are handrails on both sides of the stairs and use them. Fix handrails that are broken or loose. Make sure that handrails are as long as the stairways.  Check any carpeting to make sure that it is firmly attached to the stairs. Fix any carpet that is loose or worn.  Avoid having throw rugs at the top or bottom of the stairs. If you do have throw rugs, attach them to the floor with carpet tape.  Make sure that you have a light switch at the top of the stairs and the bottom of the stairs. If you do not have them, ask someone to add them for you. What else can I do to help prevent falls?  Wear shoes that:  Do not have high heels.  Have rubber bottoms.  Are comfortable and fit you well.  Are closed at the toe. Do not wear sandals.  If you use a  stepladder:  Make sure that it is fully opened. Do not climb a closed stepladder.  Make sure that both sides of the stepladder are locked into place.  Ask someone to hold it for you, if possible.  Clearly mark and make sure that you can see:  Any grab bars or handrails.  First and last steps.  Where the edge of each step is.  Use tools that help you move around (mobility aids) if they are needed. These include:  Canes.  Walkers.  Scooters.  Crutches.  Turn on the lights when you go into a dark area. Replace any light bulbs as soon as they burn out.  Set up your furniture so you have a clear path. Avoid moving your furniture around.  If any of your floors are uneven, fix them.  If there are any pets around you, be aware of where they are.  Review your medicines with your doctor. Some medicines can make you feel dizzy. This can increase your chance of falling. Ask your doctor what other things that you can do to help prevent falls. This information is not intended to replace advice given to you by your health care provider. Make sure you discuss any questions you have with your health care provider. Document Released: 01/20/2009 Document Revised: 09/01/2015 Document Reviewed: 04/30/2014 Elsevier Interactive Patient Education  2017 [employer].

## 2019-05-25 NOTE — Assessment & Plan Note (Addendum)
Chronic, stable with Protonix.  Continue current medication regimen.  Mag level next visit and annually.  Trial time off in future and assess if tolerated. 

## 2019-05-25 NOTE — Assessment & Plan Note (Signed)
Yearly CXR ordered. °

## 2019-05-25 NOTE — Assessment & Plan Note (Addendum)
Chronic, with initial BP slightly elevated today in office due to toothache.  Home BP readings often at goal.  Continue current medication regimen and adjust as needed + collaboration with cardiology.  Continue to monitor BP at home regularly.  CMP today.  Return in 6 months for annual physical.

## 2019-05-26 LAB — LIPID PANEL W/O CHOL/HDL RATIO
Cholesterol, Total: 207 mg/dL — ABNORMAL HIGH (ref 100–199)
HDL: 59 mg/dL (ref >39)
LDL Chol Calc (NIH): 122 mg/dL — ABNORMAL HIGH (ref 0–99)
Triglycerides: 146 mg/dL (ref 0–149)
VLDL Cholesterol Cal: 26 mg/dL (ref 5–40)

## 2019-05-26 LAB — COMPREHENSIVE METABOLIC PANEL
ALT: 17 IU/L (ref 0–32)
AST: 17 IU/L (ref 0–40)
Albumin/Globulin Ratio: 2 (ref 1.2–2.2)
Albumin: 4.3 g/dL (ref 3.8–4.8)
Alkaline Phosphatase: 89 IU/L (ref 39–117)
BUN/Creatinine Ratio: 24 (ref 12–28)
BUN: 21 mg/dL (ref 8–27)
Bilirubin Total: 0.3 mg/dL (ref 0.0–1.2)
CO2: 26 mmol/L (ref 20–29)
Calcium: 10.4 mg/dL — ABNORMAL HIGH (ref 8.7–10.3)
Chloride: 106 mmol/L (ref 96–106)
Creatinine, Ser: 0.88 mg/dL (ref 0.57–1.00)
GFR calc Af Amer: 77 mL/min/{1.73_m2} (ref >59)
GFR calc non Af Amer: 67 mL/min/{1.73_m2} (ref >59)
Globulin, Total: 2.2 g/dL (ref 1.5–4.5)
Glucose: 102 mg/dL — ABNORMAL HIGH (ref 65–99)
Potassium: 4.3 mmol/L (ref 3.5–5.2)
Sodium: 145 mmol/L — ABNORMAL HIGH (ref 134–144)
Total Protein: 6.5 g/dL (ref 6.0–8.5)

## 2019-05-26 NOTE — Progress Notes (Signed)
Please let Ms. Suk labs have returned: - Kidney function and liver function are stable - Sodium was slightly elevated, increase your water intake daily and cut back on salty foods. - Cholesterol levels remain elevated, continue your diet focus at home If any questions let me know.

## 2019-06-03 ENCOUNTER — Ambulatory Visit
Admission: RE | Admit: 2019-06-03 | Discharge: 2019-06-03 | Disposition: A | Discharge status: Home / Self Care | Payer: Medicare Other | Source: Ambulatory Visit | Attending: Nurse Practitioner | Admitting: Nurse Practitioner

## 2019-06-03 ENCOUNTER — Other Ambulatory Visit: Payer: Self-pay

## 2019-06-03 ENCOUNTER — Ambulatory Visit
Admission: RE | Admit: 2019-06-03 | Discharge: 2019-06-03 | Disposition: A | Discharge status: Home / Self Care | Payer: Medicare Other | Attending: Nurse Practitioner | Admitting: Nurse Practitioner

## 2019-06-03 DIAGNOSIS — D1431 Benign neoplasm of right bronchus and lung: Secondary | ICD-10-CM | POA: Diagnosis not present

## 2019-06-03 NOTE — Progress Notes (Signed)
Please let Ms. Drain know her chest x-ray remains stable with no acute findings.  Great news!!  Have a good day!!

## 2019-06-08 ENCOUNTER — Ambulatory Visit: Payer: Medicare Other

## 2019-06-09 ENCOUNTER — Ambulatory Visit: Payer: Medicare Other | Attending: Neurology

## 2019-06-09 DIAGNOSIS — G4733 Obstructive sleep apnea (adult) (pediatric): Secondary | ICD-10-CM | POA: Diagnosis not present

## 2019-06-10 ENCOUNTER — Other Ambulatory Visit: Payer: Self-pay

## 2019-06-19 DIAGNOSIS — Z23 Encounter for immunization: Secondary | ICD-10-CM | POA: Diagnosis not present

## 2019-06-24 ENCOUNTER — Telehealth: Payer: Self-pay

## 2019-06-24 ENCOUNTER — Other Ambulatory Visit: Payer: Self-pay | Admitting: Nurse Practitioner

## 2019-06-24 ENCOUNTER — Ambulatory Visit
Admission: RE | Admit: 2019-06-24 | Discharge: 2019-06-24 | Disposition: A | Discharge status: Home / Self Care | Payer: Medicare Other | Source: Ambulatory Visit | Attending: Nurse Practitioner | Admitting: Nurse Practitioner

## 2019-06-24 DIAGNOSIS — Z1231 Encounter for screening mammogram for malignant neoplasm of breast: Secondary | ICD-10-CM | POA: Insufficient documentation

## 2019-06-24 DIAGNOSIS — Z01818 Encounter for other preprocedural examination: Secondary | ICD-10-CM

## 2019-06-24 NOTE — Telephone Encounter (Signed)
I ordered test, can we fax this over to them?  Thank you.

## 2019-06-24 NOTE — Telephone Encounter (Signed)
Order printed and faxed to [facility].

## 2019-06-24 NOTE — Telephone Encounter (Signed)
Fax from sleep med. Patient scheduled for a Sleep Study 07/07/2019 and needs a COVID Test ordered and faxed to [facility] at [phone]. Patient is scheduled to receive her COVID Test 07/03/2019.   Routing to provider for order.

## 2019-06-25 ENCOUNTER — Other Ambulatory Visit: Payer: Self-pay | Admitting: Nurse Practitioner

## 2019-06-25 DIAGNOSIS — I519 Heart disease, unspecified: Secondary | ICD-10-CM | POA: Diagnosis not present

## 2019-06-25 DIAGNOSIS — R928 Other abnormal and inconclusive findings on diagnostic imaging of breast: Secondary | ICD-10-CM

## 2019-06-25 DIAGNOSIS — N6320 Unspecified lump in the left breast, unspecified quadrant: Secondary | ICD-10-CM

## 2019-06-25 DIAGNOSIS — I10 Essential (primary) hypertension: Secondary | ICD-10-CM | POA: Diagnosis not present

## 2019-06-25 DIAGNOSIS — R0602 Shortness of breath: Secondary | ICD-10-CM | POA: Diagnosis not present

## 2019-07-02 DIAGNOSIS — R0602 Shortness of breath: Secondary | ICD-10-CM | POA: Diagnosis not present

## 2019-07-02 DIAGNOSIS — I519 Heart disease, unspecified: Secondary | ICD-10-CM | POA: Diagnosis not present

## 2019-07-03 ENCOUNTER — Other Ambulatory Visit: Admission: RE | Admit: 2019-07-03 | Payer: Medicare Other | Source: Ambulatory Visit

## 2019-07-06 ENCOUNTER — Other Ambulatory Visit: Payer: Self-pay

## 2019-07-06 ENCOUNTER — Other Ambulatory Visit
Admission: RE | Admit: 2019-07-06 | Discharge: 2019-07-06 | Disposition: A | Discharge status: Home / Self Care | Payer: Medicare Other | Source: Ambulatory Visit | Attending: Nurse Practitioner | Admitting: Nurse Practitioner

## 2019-07-06 DIAGNOSIS — Z20822 Contact with and (suspected) exposure to covid-19: Secondary | ICD-10-CM | POA: Diagnosis not present

## 2019-07-06 DIAGNOSIS — Z01812 Encounter for preprocedural laboratory examination: Secondary | ICD-10-CM | POA: Insufficient documentation

## 2019-07-06 LAB — SARS CORONAVIRUS 2 (TAT 6-24 HRS): SARS Coronavirus 2: NEGATIVE

## 2019-07-06 NOTE — Progress Notes (Signed)
Please let [facility] know her preadmission Covid testing is negative.

## 2019-07-07 ENCOUNTER — Ambulatory Visit: Payer: Medicare Other | Attending: Neurology

## 2019-07-07 DIAGNOSIS — G4733 Obstructive sleep apnea (adult) (pediatric): Secondary | ICD-10-CM | POA: Diagnosis not present

## 2019-07-08 ENCOUNTER — Ambulatory Visit
Admission: RE | Admit: 2019-07-08 | Discharge: 2019-07-08 | Disposition: A | Discharge status: Home / Self Care | Payer: Medicare Other | Source: Ambulatory Visit | Attending: Nurse Practitioner | Admitting: Nurse Practitioner

## 2019-07-08 ENCOUNTER — Other Ambulatory Visit: Payer: Self-pay

## 2019-07-08 ENCOUNTER — Other Ambulatory Visit: Payer: Self-pay | Admitting: Nurse Practitioner

## 2019-07-08 DIAGNOSIS — R928 Other abnormal and inconclusive findings on diagnostic imaging of breast: Secondary | ICD-10-CM

## 2019-07-08 DIAGNOSIS — N6320 Unspecified lump in the left breast, unspecified quadrant: Secondary | ICD-10-CM

## 2019-07-08 DIAGNOSIS — N6323 Unspecified lump in the left breast, lower outer quadrant: Secondary | ICD-10-CM | POA: Diagnosis not present

## 2019-07-08 DIAGNOSIS — N6002 Solitary cyst of left breast: Secondary | ICD-10-CM | POA: Diagnosis not present

## 2019-07-08 DIAGNOSIS — N6489 Other specified disorders of breast: Secondary | ICD-10-CM | POA: Diagnosis not present

## 2019-07-16 DIAGNOSIS — R0602 Shortness of breath: Secondary | ICD-10-CM | POA: Diagnosis not present

## 2019-07-16 DIAGNOSIS — Z23 Encounter for immunization: Secondary | ICD-10-CM | POA: Diagnosis not present

## 2019-07-16 DIAGNOSIS — I519 Heart disease, unspecified: Secondary | ICD-10-CM | POA: Diagnosis not present

## 2019-07-16 DIAGNOSIS — R0789 Other chest pain: Secondary | ICD-10-CM | POA: Diagnosis not present

## 2019-07-16 DIAGNOSIS — I10 Essential (primary) hypertension: Secondary | ICD-10-CM | POA: Diagnosis not present

## 2019-07-17 ENCOUNTER — Ambulatory Visit
Admission: RE | Admit: 2019-07-17 | Discharge: 2019-07-17 | Disposition: A | Discharge status: Home / Self Care | Payer: Medicare Other | Source: Ambulatory Visit | Attending: Nurse Practitioner | Admitting: Nurse Practitioner

## 2019-07-17 DIAGNOSIS — R928 Other abnormal and inconclusive findings on diagnostic imaging of breast: Secondary | ICD-10-CM | POA: Diagnosis not present

## 2019-07-17 DIAGNOSIS — C50411 Malignant neoplasm of upper-outer quadrant of right female breast: Secondary | ICD-10-CM | POA: Diagnosis not present

## 2019-07-17 HISTORY — PX: BREAST BIOPSY: SHX20

## 2019-07-20 DIAGNOSIS — C50919 Malignant neoplasm of unspecified site of unspecified female breast: Secondary | ICD-10-CM

## 2019-07-21 NOTE — Progress Notes (Signed)
Phoned patient to initiate navigation.  Surgical and Med/Onc consults scheduled.

## 2019-07-22 ENCOUNTER — Ambulatory Visit: Payer: Self-pay | Admitting: Surgery

## 2019-07-22 ENCOUNTER — Other Ambulatory Visit: Payer: Self-pay | Admitting: Surgery

## 2019-07-22 DIAGNOSIS — C50411 Malignant neoplasm of upper-outer quadrant of right female breast: Secondary | ICD-10-CM

## 2019-07-22 NOTE — H&P (View-Only) (Signed)
Subjective:   CC: Malignant neoplasm of upper-outer quadrant of right female breast, unspecified estrogen receptor status (CMS-HCC) [C50.411] HPI:  Melissa Merritt is a 69 y.o. female who was referred by [facility] for evaluation of above. Change was noted on last screening mammogram. Patient does routinely do self breast exams.  Patient has not noted a change on breast exam. Age of menarche was 22. Age of menopause was 23. Patient denies hormonal therapy. Patient is G1P1. Age of first live birth was 73. Patient did not breast feed. Patient denies nipple discharge. Patient denies previous breast biopsy. Patient denies a personal history of breast cancer.   Past Medical History:  has a past medical history of Bladder prolapse, female, acquired, Colon polyp, H/O mammogram (12/05/2009), H/O mammogram (01/17/2011), H/O mammogram (01/22/2012), H/O mammogram (01/23/2013), Heart disease, History of bone density study (07/03/2004), History of bone density study (07/10/2006), History of bone density study (12/26/2011), adenomatous colonic polyps, Hypertension, Lung disease, Obesity, Sleep apnea, and Ulcer.  Past Surgical History:  has a past surgical history that includes Thoracoscopy With Lobectomy Lung Robot Assisted (Right, 01/2004); Colonoscopy (09/22/2004); egd (11/17/2003); Colonoscopy (10/19/2013); Hysteroscopy; Laparoscopic cholecystectomy; and Cataract extraction.  Family History: family history includes COPD in her mother; Coronary Artery Disease (Blocked arteries around heart) in her father; Heart disease in her father; High blood pressure (Hypertension) in her brother, maternal aunt, and sister; Myocardial Infarction (Heart attack) in her father; No Known Problems in her son.  Social History:  reports that she has quit smoking. Her smoking use included cigarettes. She has a 20.00 pack-year smoking history. She has never used smokeless tobacco. She reports current alcohol use. She  reports that she does not use drugs.  Current Medications: has a current medication list which includes the following prescription(s): amlodipine, aspirin, calcium carbonate-vitamin d3, cyanocobalamin, erythromycin, hydrocortisone, metoprolol succinate, pantoprazole, red yeast rice, st. john's wort, valsartan, and omega-3 fatty acids/fish oil.  Allergies:       Allergies as of 07/22/2019 - Reviewed 07/22/2019  Allergen Reaction Noted  . Ace inhibitors Other (See Comments) 07/18/2013  . Cephalexin Other (See Comments) 01/19/2015  . Hydrochlorothiazide Other (See Comments) 07/18/2013  . Iodine Swelling     ROS:  A 15 point review of systems was performed and was negative except as noted in HPI   Objective:   BP 157/75   Pulse 73   Ht 170.2 cm ('5\' 7"' )   Wt (!) 104.8 kg (231 lb)   BMI 36.18 kg/m   Constitutional :  alert, appears stated age, cooperative and no distress  Lymphatics/Throat:  no asymmetry, masses, or scars  Respiratory:  clear to auscultation bilaterally  Cardiovascular:  regular rate and rhythm  Gastrointestinal: soft, non-tender; bowel sounds normal; no masses,  no organomegaly.   Musculoskeletal: Steady gait and movement  Skin: Cool and moist  Psychiatric: Normal affect, non-agitated, not confused  Breast:  Chaperone present for exam.  breasts appear normal, no suspicious masses, no skin or nipple changes or axillary nodes.    LABS:  SURGICAL PATHOLOGY  SURGICAL PATHOLOGY  CASE: [id number]  PATIENT: Melissa Merritt  Surgical Pathology Report      Specimen Submitted:  A. Breast, right, upper outer quadrant; biopsy   Clinical History: Small area of distortion right upper-outer breast seen  at mammography with no [name] correlate. Prob. CSL or [place].  Post biopsy mammograms show the ribbon-shaped biopsy marker clip 3 cm  inferior and posterior to the biopsied distortion in the right breast in  the lateral projection and at the location of the biopsied  distortion in  the craniocaudal projection.      DIAGNOSIS:  A. BREAST, RIGHT, UPPER OUTER QUADRANT; STEREOTACTIC-GUIDED CORE BIOPSY:  - INVASIVE MAMMARY CARCINOMA, NO SPECIAL TYPE.   Size of invasive carcinoma: 4 mm in this sample  Histologic grade of invasive carcinoma: Grade 1            Glandular/tubular differentiation score: 2            Nuclear pleomorphism score: 1            Mitotic rate score: 1            Total score: 4  Ductal carcinoma in situ:Present, intermediate nuclear grade with  central necrosis  Lymphovascular invasion: Not identified  Additional finding: Atypical lobular hyperplasia   ER/PR/HER2: Immunohistochemistry will be performed on block A2, with  reflex to [place] for HER2 2+. The results will be reported in an addendum.   Comment:  The definitive grade will be assigned on the excisional specimen.   GROSS DESCRIPTION:  A. Labeled: Right breast, upper outer quadrant  Received: in a formalin-filled [place] collection device  Accompanying specimen radiograph: No  Time/Date in fixative: Collected at 9:03 AM and placed into formalin at  9:06 AM on 07/17/2019.  Cold ischemic time: Less than 5 minutes  Total fixation time: 12 hours  Core pieces: Multiple  Measurement: Aggregate, 7.5 x2.0 x 0.3 cm  Description / comments: Received are needle core biopsy fragments of  tan-yellow, fibrofatty tissue  Inked: Blue  Entirely submitted in cassette(s): 1-6    Final Diagnosis performed by Bryan Lemma, MD.  Electronically signed  07/20/2019 1:50:46PM  The electronic signature indicates that the named Attending Pathologist  has evaluated the specimen  Technical component performed at [facility], [address] [place],  [place] [address] Lab: [phone] Dir: Rush Farmer, MD, MMM  Professionalcomponent performed at [facility], [facility], [place], [place], [place] [address] Lab: [phone]   Dir: Dellia Nims. Rubinas, MD     RADS: CLINICAL DATA: Recall from screening mammography with  tomosynthesis, possible architectural distortion involving the UPPER  OUTER RIGHT breast at MIDDLE depth and possible mass involving the  LOWER OUTER LEFT breast at MIDDLE depth which was marked only on the  CC images.   EXAM:  DIGITAL DIAGNOSTIC BILATERAL MAMMOGRAM WITH CAD AND TOMO   LIMITED ULTRASOUND BILATERAL BREASTS   COMPARISON: Previous exam(s).   ACR Breast Density Category b: There are scattered areas of  fibroglandular density.   FINDINGS:  RIGHT: Tomosynthesis and synthesized spot-compression CC and MLO  views of the area of concern in the RIGHT breast and a tomosynthesis  and synthesized full field mediolateral view of the RIGHT breast  were obtained.   Spot compression images confirm a small focus of architectural  distortion in the UPPER OUTER QUADRANT at MIDDLE depth. There is no  associated mass or suspicious calcifications.   No suspicious findings elsewhere on the full field mediolateral  images.   The full field mediolateral image was processed with CAD.   Targeted RIGHT breast ultrasound was performed, showing normal  scattered fibroglandular tissue in the [place]. There  is no sonographic correlate for the architectural distortion on  mammography.   Sonographic evaluation of the RIGHT axilla demonstrates no  pathologic lymphadenopathy.   On correlative physical exam, there is no palpable abnormality in  the UPPER OUTER QUADRANT of the RIGHT breast.  LEFT: Tomosynthesis and synthesized spot-compression CC view of the  area of concern in the LEFT breast, a tomosynthesis and synthesized  full field mediolateral view of the LEFT breast and a tomosynthesis  and synthesized spot-compression mediolateral view of the area of  concern in the LEFT breast were obtained.   Spot compression images confirm a circumscribed isodense mass in the   LOWER OUTER QUADRANT at MIDDLE depth measuring approximately 7-8 mm.  There is no associated architectural distortion or suspicious  calcifications.   No suspicious findings elsewhere on the full field mediolateral  images.   The full field mediolateral image was processed with CAD.   Targeted LEFT breast ultrasound is performed, showing an oval  circumscribed parallel anechoic mass with scattered internal echoes  at the 4 o'clock position approximately 4 cm from nipple at MIDDLE  depth, measuring approximately 4 x 8 x 8 mm, demonstrating posterior  acoustic enhancement and no internal color Doppler flow,  corresponding to the screening mammographic finding. No suspicious  solid mass or abnormal acoustic shadowing is identified.   IMPRESSION:  1. Persistent architectural distortion involving the UPPER OUTER  QUADRANT of the RIGHT breast without sonographic correlate.  2. No pathologic RIGHT axillary lymphadenopathy.  3. No mammographic or sonographic evidence of malignancy involving  the LEFT breast.  4. Benign 8 mm cyst in the LOWER OUTER QUADRANT of the LEFT breast  which accounts for the screening mammographic finding.   RECOMMENDATION:  Stereotactic tomosynthesis core needle biopsy of the architectural  distortion in the RIGHT breast.   The stereotactic tomosynthesis biopsy procedure was discussed with  the patient and her questions were answered. She wishes to proceed  with the biopsy. She will be contacted by the breast navigator at  the [facility] in order to schedule the procedure.   I have discussed the findings and recommendations with the patient.   BI-RADS CATEGORY 4: Suspicious   Assessment:   Malignant neoplasm of upper-outer quadrant of right female breast, unspecified estrogen receptor status (CMS-HCC) [C50.411]   LV dysfunction, COPD  Plan:   1. Malignant neoplasm of upper-outer quadrant of right female breast, unspecified estrogen  receptor status (CMS-HCC) [C50.411]  Discussed the risk of surgery including recurrence, chronic pain, post-op infxn, poor/delayed wound healing, poor cosmesis, seroma, hematoma formation, and possible re-operation to address said risks. The risks of general anesthetic, if used, includes MI, CVA, sudden death or even reaction to anesthetic medications also discussed.  Typical post-op recovery time and possbility of activity restrictions were also discussed.  Alternatives include continued observation.  Benefits include possible symptom relief, pathologic evaluation, and/or curative excision.   The patient verbalized understanding and all questions were answered to the patient's satisfaction.  2. Patient has elected to proceed with surgical treatment. Procedure will be scheduled.  Written consent was obtained.RIGHT RF LOCALIZER WITH SLNB. Pending cards clearance.  Increased perioperative risk due to comorbidities.     Electronically signed by Benjamine Sprague, DO on 07/22/2019 9:25 AM

## 2019-07-22 NOTE — H&P (Signed)
Subjective:   CC: Malignant neoplasm of upper-outer quadrant of right female breast, unspecified estrogen receptor status (CMS-HCC) [C50.411] HPI:  Melissa Merritt is a 69 y.o. female who was referred by [facility] for evaluation of above. Change was noted on last screening mammogram. Patient does routinely do self breast exams.  Patient has not noted a change on breast exam. Age of menarche was 77. Age of menopause was 36. Patient denies hormonal therapy. Patient is G1P1. Age of first live birth was 29. Patient did not breast feed. Patient denies nipple discharge. Patient denies previous breast biopsy. Patient denies a personal history of breast cancer.   Past Medical History:  has a past medical history of Bladder prolapse, female, acquired, Colon polyp, H/O mammogram (12/05/2009), H/O mammogram (01/17/2011), H/O mammogram (01/22/2012), H/O mammogram (01/23/2013), Heart disease, History of bone density study (07/03/2004), History of bone density study (07/10/2006), History of bone density study (12/26/2011), adenomatous colonic polyps, Hypertension, Lung disease, Obesity, Sleep apnea, and Ulcer.  Past Surgical History:  has a past surgical history that includes Thoracoscopy With Lobectomy Lung Robot Assisted (Right, 01/2004); Colonoscopy (09/22/2004); egd (11/17/2003); Colonoscopy (10/19/2013); Hysteroscopy; Laparoscopic cholecystectomy; and Cataract extraction.  Family History: family history includes COPD in her mother; Coronary Artery Disease (Blocked arteries around heart) in her father; Heart disease in her father; High blood pressure (Hypertension) in her brother, maternal aunt, and sister; Myocardial Infarction (Heart attack) in her father; No Known Problems in her son.  Social History:  reports that she has quit smoking. Her smoking use included cigarettes. She has a 20.00 pack-year smoking history. She has never used smokeless tobacco. She reports current alcohol use. She  reports that she does not use drugs.  Current Medications: has a current medication list which includes the following prescription(s): amlodipine, aspirin, calcium carbonate-vitamin d3, cyanocobalamin, erythromycin, hydrocortisone, metoprolol succinate, pantoprazole, red yeast rice, st. john's wort, valsartan, and omega-3 fatty acids/fish oil.  Allergies:       Allergies as of 07/22/2019 - Reviewed 07/22/2019  Allergen Reaction Noted  . Ace inhibitors Other (See Comments) 07/18/2013  . Cephalexin Other (See Comments) 01/19/2015  . Hydrochlorothiazide Other (See Comments) 07/18/2013  . Iodine Swelling     ROS:  A 15 point review of systems was performed and was negative except as noted in HPI   Objective:   BP 157/75   Pulse 73   Ht 170.2 cm ('5\' 7"' )   Wt (!) 104.8 kg (231 lb)   BMI 36.18 kg/m   Constitutional :  alert, appears stated age, cooperative and no distress  Lymphatics/Throat:  no asymmetry, masses, or scars  Respiratory:  clear to auscultation bilaterally  Cardiovascular:  regular rate and rhythm  Gastrointestinal: soft, non-tender; bowel sounds normal; no masses,  no organomegaly.   Musculoskeletal: Steady gait and movement  Skin: Cool and moist  Psychiatric: Normal affect, non-agitated, not confused  Breast:  Chaperone present for exam.  breasts appear normal, no suspicious masses, no skin or nipple changes or axillary nodes.    LABS:  SURGICAL PATHOLOGY  SURGICAL PATHOLOGY  CASE: [phone]  PATIENT: Melissa Merritt  Surgical Pathology Report      Specimen Submitted:  A. Breast, right, upper outer quadrant; biopsy   Clinical History: Small area of distortion right upper-outer breast seen  at mammography with no [name] correlate. Prob. CSL or [place].  Post biopsy mammograms show the ribbon-shaped biopsy marker clip 3 cm  inferior and posterior to the biopsied distortion in the right breast in  the lateral projection and at the location of the biopsied  distortion in  the craniocaudal projection.      DIAGNOSIS:  A. BREAST, RIGHT, UPPER OUTER QUADRANT; STEREOTACTIC-GUIDED CORE BIOPSY:  - INVASIVE MAMMARY CARCINOMA, NO SPECIAL TYPE.   Size of invasive carcinoma: 4 mm in this sample  Histologic grade of invasive carcinoma: Grade 1            Glandular/tubular differentiation score: 2            Nuclear pleomorphism score: 1            Mitotic rate score: 1            Total score: 4  Ductal carcinoma in situ:Present, intermediate nuclear grade with  central necrosis  Lymphovascular invasion: Not identified  Additional finding: Atypical lobular hyperplasia   ER/PR/HER2: Immunohistochemistry will be performed on block A2, with  reflex to [place] for HER2 2+. The results will be reported in an addendum.   Comment:  The definitive grade will be assigned on the excisional specimen.   GROSS DESCRIPTION:  A. Labeled: Right breast, upper outer quadrant  Received: in a formalin-filled [place] collection device  Accompanying specimen radiograph: No  Time/Date in fixative: Collected at 9:03 AM and placed into formalin at  9:06 AM on 07/17/2019.  Cold ischemic time: Less than 5 minutes  Total fixation time: 12 hours  Core pieces: Multiple  Measurement: Aggregate, 7.5 x2.0 x 0.3 cm  Description / comments: Received are needle core biopsy fragments of  tan-yellow, fibrofatty tissue  Inked: Blue  Entirely submitted in cassette(s): 1-6    Final Diagnosis performed by Bryan Lemma, MD.  Electronically signed  07/20/2019 1:50:46PM  The electronic signature indicates that the named Attending Pathologist  has evaluated the specimen  Technical component performed at [facility], [address], [place],  [place] [address] Lab: [phone] Dir: Rush Farmer, MD, MMM  Professionalcomponent performed at [facility], [facility], [place], [place], [place] [address] Lab: [phone]   Dir: Dellia Nims. Rubinas, MD     RADS: CLINICAL DATA: Recall from screening mammography with  tomosynthesis, possible architectural distortion involving the UPPER  OUTER RIGHT breast at MIDDLE depth and possible mass involving the  LOWER OUTER LEFT breast at MIDDLE depth which was marked only on the  CC images.   EXAM:  DIGITAL DIAGNOSTIC BILATERAL MAMMOGRAM WITH CAD AND TOMO   LIMITED ULTRASOUND BILATERAL BREASTS   COMPARISON: Previous exam(s).   ACR Breast Density Category b: There are scattered areas of  fibroglandular density.   FINDINGS:  RIGHT: Tomosynthesis and synthesized spot-compression CC and MLO  views of the area of concern in the RIGHT breast and a tomosynthesis  and synthesized full field mediolateral view of the RIGHT breast  were obtained.   Spot compression images confirm a small focus of architectural  distortion in the UPPER OUTER QUADRANT at MIDDLE depth. There is no  associated mass or suspicious calcifications.   No suspicious findings elsewhere on the full field mediolateral  images.   The full field mediolateral image was processed with CAD.   Targeted RIGHT breast ultrasound was performed, showing normal  scattered fibroglandular tissue in the [place]. There  is no sonographic correlate for the architectural distortion on  mammography.   Sonographic evaluation of the RIGHT axilla demonstrates no  pathologic lymphadenopathy.   On correlative physical exam, there is no palpable abnormality in  the UPPER OUTER QUADRANT of the RIGHT breast.  LEFT: Tomosynthesis and synthesized spot-compression CC view of the  area of concern in the LEFT breast, a tomosynthesis and synthesized  full field mediolateral view of the LEFT breast and a tomosynthesis  and synthesized spot-compression mediolateral view of the area of  concern in the LEFT breast were obtained.   Spot compression images confirm a circumscribed isodense mass in the   LOWER OUTER QUADRANT at MIDDLE depth measuring approximately 7-8 mm.  There is no associated architectural distortion or suspicious  calcifications.   No suspicious findings elsewhere on the full field mediolateral  images.   The full field mediolateral image was processed with CAD.   Targeted LEFT breast ultrasound is performed, showing an oval  circumscribed parallel anechoic mass with scattered internal echoes  at the 4 o'clock position approximately 4 cm from nipple at MIDDLE  depth, measuring approximately 4 x 8 x 8 mm, demonstrating posterior  acoustic enhancement and no internal color Doppler flow,  corresponding to the screening mammographic finding. No suspicious  solid mass or abnormal acoustic shadowing is identified.   IMPRESSION:  1. Persistent architectural distortion involving the UPPER OUTER  QUADRANT of the RIGHT breast without sonographic correlate.  2. No pathologic RIGHT axillary lymphadenopathy.  3. No mammographic or sonographic evidence of malignancy involving  the LEFT breast.  4. Benign 8 mm cyst in the LOWER OUTER QUADRANT of the LEFT breast  which accounts for the screening mammographic finding.   RECOMMENDATION:  Stereotactic tomosynthesis core needle biopsy of the architectural  distortion in the RIGHT breast.   The stereotactic tomosynthesis biopsy procedure was discussed with  the patient and her questions were answered. She wishes to proceed  with the biopsy. She will be contacted by the breast navigator at  the [facility] in order to schedule the procedure.   I have discussed the findings and recommendations with the patient.   BI-RADS CATEGORY 4: Suspicious   Assessment:   Malignant neoplasm of upper-outer quadrant of right female breast, unspecified estrogen receptor status (CMS-HCC) [C50.411]   LV dysfunction, COPD  Plan:   1. Malignant neoplasm of upper-outer quadrant of right female breast, unspecified estrogen  receptor status (CMS-HCC) [C50.411]  Discussed the risk of surgery including recurrence, chronic pain, post-op infxn, poor/delayed wound healing, poor cosmesis, seroma, hematoma formation, and possible re-operation to address said risks. The risks of general anesthetic, if used, includes MI, CVA, sudden death or even reaction to anesthetic medications also discussed.  Typical post-op recovery time and possbility of activity restrictions were also discussed.  Alternatives include continued observation.  Benefits include possible symptom relief, pathologic evaluation, and/or curative excision.   The patient verbalized understanding and all questions were answered to the patient's satisfaction.  2. Patient has elected to proceed with surgical treatment. Procedure will be scheduled.  Written consent was obtained.RIGHT RF LOCALIZER WITH SLNB. Pending cards clearance.  Increased perioperative risk due to comorbidities.     Electronically signed by Benjamine Sprague, DO on 07/22/2019 9:25 AM

## 2019-07-23 ENCOUNTER — Other Ambulatory Visit: Payer: Self-pay

## 2019-07-23 LAB — SURGICAL PATHOLOGY

## 2019-07-23 NOTE — Progress Notes (Signed)
[facility]  [address], [address] [place], [place] [address] Phone: [phone]  Fax: [phone]   Clinic Day:  07/24/2019  Referring physician: Venita Lick, NP  Chief Complaint: Melissa Merritt is a 69 y.o. female with right breast cancer who is referred in consultation by Marnee Guarneri, NP for assessment and management.   HPI:  The patient undergoes annual mammograms.  She performs regular breast exams.  Bilateral screening mammogram on 06/24/2019 revealed possible distortion in the right breast and possible mass in the left breast.   Bilateral diagnostic mammogram on 07/08/2019 revealed persistent architectural distortion involving the UPPER OUTER QUADRANT of the RIGHT breast without sonographic correlate.  There was no pathologic RIGHT axillary lymphadenopathy.  There was no mammographic or sonographic evidence of malignancy involving the LEFT breast.  There was a benign 8 mm cyst in the LOWER OUTER QUADRANT of the LEFT breast accounting for the screening mammographic finding.  She underwent stereotactic right breast biopsy on 07/17/2019.  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  Tumor was ER+ (91-100%), PR+ (91-100%), and Her2/neu- (score 0).  She met with Dr [name] on 07/22/2019. She is scheduled for partial mastectomy with sentinel lymph node biopsy on 08/13/2019.  Last bone density study was on 02/22/2016 (no report available).  She reports that it was normal. She agrees to have another bone density scheduled.   She has congestive heart failure (CHF). Last echocardiogram on 07/02/2019 revealed an ejection fraction of 45%.   She says she is supposed to to have a colonoscopy at some point this year.   She was diagnosed with sleep apnea; she recently had a sleep study on 07/07/2019. She is waiting on her CPAP machine. She uses one pillow  to prop her head up in bed and one pillow for her arm.   On 06/09/2019, she had a root canal.  She is scheduled to get a temporary crown on 08/03/2019 which will be completed on 08/12/2019. She is thinking about postponing her crown since she has surgery the following day on 08/13/2019.  She will have a COVID test prior to surgery on 08/11/2019 and begin quarantine.   Symptomatically, she is doing well. She has shortness of breath mostly on exertion.  She has swelling in her legs when she travels. She sometimes wakes up from her sleep in the middle of the night winded and she feels like she is having an anxiety attack. She denies any numbness or weakness.   She underwent menarche at age 11 and menopause at age 27. She is G1P1. First pregancy was at age 8. She did not breast feed. She was on birth control pills on and off for 15 years. She denies hormone replacement therapy.   Family history is notable for a maternal aunt with breast cancer.   Past Medical History:  Diagnosis Date  . Benign tumor of lung   . CHF (congestive heart failure) ([place])   . GERD (gastroesophageal reflux disease)   . Hyperlipidemia   . Hypertension   . Insomnia   . Osteopenia   . Plantar fasciitis   . S/P pneumonectomy     Past Surgical History:  Procedure Laterality Date  . BREAST BIOPSY Right 07/17/2019   Affirm bx-"Ribbon" clip-path pending  . bronchoscopy    . CATARACT EXTRACTION, BILATERAL     Left- 11/21/15, right- 12/23/15  . CHOLECYSTECTOMY    .  CYSTECTOMY  11/03/09   cheek  . EYE SURGERY    . FOOT SURGERY Bilateral 2010  . LOBECTOMY    . LUNG SURGERY Right   . UPPER GI ENDOSCOPY  2005    Family History  Problem Relation Age of Onset  . COPD Mother   . Hypertension Mother   . Emphysema Mother   . Osteoporosis Mother   . Heart disease Father   . Hyperlipidemia Sister   . Hyperlipidemia Brother   . Heart disease Maternal Grandmother   . Heart disease Maternal Grandfather   . Heart disease  Paternal Grandmother   . Stroke Paternal Grandmother   . Heart disease Paternal Grandfather   . Stroke Paternal Grandfather   . Cancer Maternal Aunt        breast and lung  . Osteoporosis Maternal Aunt   . Aortic aneurysm Maternal Aunt   . Breast cancer Maternal Aunt 76    Social History:  reports that she quit smoking about 40 years ago. She has never used smokeless tobacco. She reports that she does not drink alcohol or use drugs. She has not been exposed to radiations or toxins. She is retired. She used to work at [employer].  She lives in [place].  The patient is accompanied by her husband, Verdia Kuba, on the [place] today.   Allergies:  Allergies  Allergen Reactions  . Ace Inhibitors Other (See Comments)    "Makes me feel strange."  . Contrast Media [Iodinated Diagnostic Agents] Swelling  . Hctz [Hydrochlorothiazide] Other (See Comments)  . Keflex [Cephalexin] Other (See Comments)    thrush    Current Medications: Current Outpatient Medications  Medication Sig Dispense Refill  . amLODipine (NORVASC) 5 MG tablet Take 1 tablet (5 mg total) by mouth daily. 90 tablet 3  . aspirin 81 MG tablet Take 81 mg by mouth daily.    . Biotin 5000 MCG TABS Take 5,000 mcg by mouth 2 (two) times daily.    . Calcium Carb-Cholecalciferol (CALCIUM-VITAMIN D) 600-400 MG-UNIT TABS Take 1 tablet by mouth 2 (two) times daily.    . Cyanocobalamin (B-12) 2500 MCG TABS Take 2,500 mcg by mouth daily.    . metoprolol succinate (TOPROL-XL) 25 MG 24 hr tablet Take 1 tablet (25 mg total) by mouth daily. 90 tablet 3  . pantoprazole (PROTONIX) 40 MG tablet Take 1 tablet (40 mg total) by mouth 2 (two) times daily. 180 tablet 3  . Red Yeast Rice Extract (RED YEAST RICE PO) Take 1 tablet by mouth in the morning and at bedtime.    . St Johns Wort 300 MG CAPS Take 300 mg by mouth 2 (two) times daily.    . valsartan (DIOVAN) 160 MG tablet Take 1 tablet (160 mg total) by mouth daily. 90 tablet 3  . acetaminophen  (TYLENOL) 500 MG tablet Take 1,000 mg by mouth every 6 (six) hours as needed for moderate pain.     . cholecalciferol (VITAMIN D3) 25 MCG (1000 UNIT) tablet Take 1,000 Units by mouth in the morning and at bedtime.    Marland Kitchen ibuprofen (IBU-200) 200 MG tablet Take 400 mg by mouth every 6 (six) hours as needed for moderate pain.      No current facility-administered medications for this visit.    Review of Systems  Constitutional: Negative for chills, diaphoresis, fever, malaise/fatigue and weight loss.       She feels "ok".  HENT: Negative for congestion, hearing loss, nosebleeds, sinus pain, sore throat and  tinnitus.   Eyes: Negative for blurred vision and double vision.  Respiratory: Positive for shortness of breath (on exertion, mostly). Negative for cough.        Sleep apnea.  Cardiovascular: Positive for leg swelling (when she travels). Negative for chest pain, palpitations and orthopnea (1 pillow).  Gastrointestinal: Negative for abdominal pain, blood in stool, constipation, diarrhea, heartburn, melena, nausea and vomiting.  Genitourinary: Negative for dysuria, frequency and urgency.       Incontinence s/p prolapsed uterus.  Musculoskeletal: Negative.  Negative for back pain, falls, joint pain and myalgias.  Skin: Negative for itching and rash.  Neurological: Negative.  Negative for dizziness, tingling, tremors, sensory change, speech change, focal weakness, weakness and headaches.  Endo/Heme/Allergies: Negative.  Does not bruise/bleed easily.  Psychiatric/Behavioral: Negative for depression and memory loss. The patient is nervous/anxious. The patient does not have insomnia.   All other systems reviewed and are negative.  Performance status (ECOG):  0  Vitals: Blood pressure (!) 148/76, pulse 74, temperature 97.8 F (36.6 C), temperature source Tympanic, resp. rate 18, height '5\' 6"'  (1.676 m), weight 229 lb 4.5 oz (104 kg), SpO2 98 %.  Physical Exam  Nursing note and vitals  reviewed. Constitutional: She is oriented to person, place, and time. She appears well-developed and well-nourished. No distress.  HENT:  Head: Normocephalic and atraumatic.  Mouth/Throat: Oropharynx is clear and moist.  Short styled brown and blonde hair.  Mask.   Eyes: Pupils are equal, round, and reactive to light. Conjunctivae and EOM are normal. Right eye exhibits no discharge. Left eye exhibits no discharge. No scleral icterus.  Neck: No JVD present.  Cardiovascular: Normal rate, regular rhythm, normal heart sounds and intact distal pulses. Exam reveals no gallop and no friction rub.  No murmur heard. Respiratory: Effort normal and breath sounds normal. No respiratory distress. She has no wheezes. She has no rales. She exhibits no tenderness. Right breast exhibits tenderness. Right breast exhibits no inverted nipple, no nipple discharge and no skin change. Left breast exhibits no inverted nipple, no nipple discharge, no skin change and no tenderness.  Upper outer biopsy site with steri-strips.  Upper quadrant fibrocystic changes with fullness (? hematoma) s/p biopsy.  Left breast with fibrocystic changes in the upper inner quadrant.  GI: Soft. Bowel sounds are normal. She exhibits no distension and no mass. There is no abdominal tenderness. There is no rebound and no guarding.  Musculoskeletal:        General: No tenderness or edema. Normal range of motion.     Cervical back: Normal range of motion and neck supple.  Lymphadenopathy:       Head (right side): No preauricular, no posterior auricular and no occipital adenopathy present.       Head (left side): No preauricular, no posterior auricular and no occipital adenopathy present.    She has no cervical adenopathy.    She has no axillary adenopathy.       Right: No inguinal and no supraclavicular adenopathy present.       Left: No inguinal and no supraclavicular adenopathy present.  Neurological: She is alert and oriented to person,  place, and time.  Skin: Skin is warm and dry. No rash noted. She is not diaphoretic. No erythema. No pallor.  Psychiatric: She has a normal mood and affect. Her behavior is normal. Judgment and thought content normal.    No visits with results within 3 Day(s) from this visit.  Latest known visit with results is:  Hospital Outpatient Visit on 07/17/2019  Component Date Value Ref Range Status  . SURGICAL PATHOLOGY 07/17/2019    Final-Edited                   Value:SURGICAL PATHOLOGY THIS IS AN ADDENDUM REPORT CASE: [id number] PATIENT: [name] Surgical Pathology Report Addendum  Reason for Addendum #1:  Breast Biomarker Results  Specimen Submitted: A. Breast, right, upper outer quadrant; biopsy  Clinical History: Small area of distortion right upper-outer breast seen at mammography with no [name] correlate.  Prob. CSL or [place]. Post biopsy mammograms show the ribbon-shaped biopsy marker clip 3 cm inferior and posterior to the biopsied distortion in the right breast in the lateral projection and at the location of the biopsied distortion in the craniocaudal projection.   DIAGNOSIS: A. BREAST, RIGHT, UPPER OUTER QUADRANT; STEREOTACTIC-GUIDED CORE BIOPSY: - INVASIVE MAMMARY CARCINOMA, NO SPECIAL TYPE.  Size of invasive carcinoma: 4 mm in this sample Histologic grade of invasive carcinoma: Grade 1                      Glandular/tubular differentiation score: 2                      Nucle                         ar pleomorphism score: 1                      Mitotic rate score: 1                      Total score: 4 Ductal carcinoma in situ: Present, intermediate nuclear grade with central necrosis Lymphovascular invasion: Not identified Additional finding: Atypical lobular hyperplasia  ER/PR/HER2: Immunohistochemistry will be performed on block A2, with reflex to [place] for HER2 2+. The results will be reported in an addendum.  Comment: The definitive grade will be assigned  on the excisional specimen.  GROSS DESCRIPTION: A. Labeled: Right breast, upper outer quadrant Received: in a formalin-filled [place] collection device Accompanying specimen radiograph: No Time/Date in fixative: Collected at 9:03 AM and placed into formalin at 9:06 AM on 07/17/2019. Cold ischemic time: Less than 5 minutes Total fixation time: 12 hours Core pieces: Multiple Measurement: Aggregate, 7.5 x 2.0 x 0.3 cm Description / comments: Received are needle core biopsy fragments of tan-yellow, fibrofatt                         y tissue Inked: Blue Entirely submitted in cassette(s): 1-6   Final Diagnosis performed by Bryan Lemma, MD.   Electronically signed 07/20/2019 1:50:46PM The electronic signature indicates that the named Attending Pathologist has evaluated the specimen Technical component performed at [facility], [address], [place], [place] [address] Lab: [phone] Dir: Rush Farmer, MD, MMM  Professional component performed at [facility], [facility], [place], [place], [place] [address] Lab: [phone] Dir: Dellia Nims. Rubinas, MD  ADDENDUM: BREAST BIOMARKER TESTS Estrogen Receptor (ER) Status: POSITIVE                      Percentage of cells with nuclear positivity: 91-100%                      Average intensity of staining: Strong  Progesterone Receptor (PgR) Status: POSITIVE  Percentage of cells with nuclear positivity: 91-100%                      Average intensity of staining: Strong  HER2 (by immunohistochemistry): NEGATIVE (                         Score 0)  Cold Ischemia and Fixation Times: Meet requirements specified in latest version of the ASCO/CAP guidelines Testing Performed on Block Number(s): A2  METHODS Fixative: Formalin Estrogen Receptor:  FDA cleared ([place]) Primary Antibody:  SP1 Progesterone Receptor: FDA cleared ([place]) Primary Antibody: 1E2 HER2 (by IHC): FDA cleared ([place]) Primary  Antibody: 4B5 ([facility]) Immunohistochemistry controls worked appropriately. Slides were prepared by [facility], [place], [place], and interpreted by Betsy Pries, MD.  This test was developed and its performance characteristics determined by [facility]. It has not been cleared or approved by the [name] Food and Drug Administration. The FDA does not require this test to go through premarket FDA review. This test is used for clinical purposes. It should not be regarded as investigational or for research. This laboratory is certified under the Clinical Laboratory Improvement Amendments (CLIA) as qualified to pe                         rform high complexity clinical laboratory testing.  Addendum #1 performed by Betsy Pries, MD.   Electronically signed 07/23/2019 4:44:41PM The electronic signature indicates that the named Attending Pathologist has evaluated the specimen Technical component performed at [facility], [address], [place], [place] [address] Lab: [phone] Dir: Rush Farmer, MD, MMM  Professional component performed at [facility], [facility], [place], [place], [place] [address] Lab: [phone] Dir: Dellia Nims. Reuel Derby, MD    Assessment:  Melissa Merritt is a 69 y.o. female with right breast cancer s/p stereotactic biopsy on 07/17/2019.  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  Tumor was ER+ (91-100%), PR+ (91-100%), and Her2/neu- (score 0).  Bilateral diagnostic mammogram on 07/08/2019 revealed persistent architectural distortion involving the UPPER OUTER QUADRANT of the RIGHT breast without sonographic correlate.  There was no pathologic RIGHT axillary lymphadenopathy.  There was no mammographic or sonographic evidence of malignancy involving the LEFT breast.    She is scheduled for partial mastectomy with sentinel lymph node biopsy on  08/13/2019.  Last bone density was on 02/22/2016 (no report available).  She has CHF and sleep apnea.  Echo on 07/02/2019 revealed an EF of 45%.  She is scheduled to have a root canal and a crown.  Symptomatically, she feels good.  Exam reveals post biopsy changes.  Plan: 1.   Labs today:  CBC with diff, CMP, CA27.29. 2.   Right breast cancer  Mammogram revealed architectural distortion, but no estimate of size of lesion.  There was no sonographic correlate to the distortion seen on mammogram.  Lymph nodes appear benign.  Review biopsy pathology report with patient.   Tumor is hormone receptor + and Her2/neu -.  Discuss plan for lumpectomy and SLN biopsy followed likely by radiation and adjuvant endocrine therapy for 5 years.  Discuss consideration of Oncotype DX based on size of lesion to assess benefit for chemotherapy.  Discuss tamoxifen and aromatase inhibitors.   Potential side effects reviewed.   Information provided.  Discuss bone density study prior to probable aromatase  inhibitor (Femara). 3.   Bone density next week. 4.   RTC 2 weeks after surgery (around 08/31/2019) for MD assessment, review of pathology and discussion regarding direction of therapy.  I discussed the assessment and treatment plan with the patient.  The patient was provided an opportunity to ask questions and all were answered.  The patient agreed with the plan and demonstrated an understanding of the instructions.  The patient was advised to call back if the symptoms worsen or if the condition fails to improve as anticipated.  I provided 45 minutes (10:15 AM - 11:00 AM) of face-to-face time during this this encounter and > 50% was spent counseling as documented under my assessment and plan. An additional 15-20 minutes were spent reviewing her chart (Epic and Care Everywhere) including notes, labs, and imaging studies.    Reham Slabaugh C. Mike Gip, MD, PhD    07/24/2019, 10:15 AM  I, Heywood Footman, am acting as  Education administrator for [employer]. Mike Gip, MD, PhD.  I, Alanya Vukelich C. Mike Gip, MD, have reviewed the above documentation for accuracy and completeness, and I agree with the above.

## 2019-07-24 ENCOUNTER — Encounter: Payer: Self-pay | Admitting: Hematology and Oncology

## 2019-07-24 ENCOUNTER — Other Ambulatory Visit: Payer: Self-pay | Admitting: Hematology and Oncology

## 2019-07-24 ENCOUNTER — Other Ambulatory Visit: Payer: Self-pay

## 2019-07-24 ENCOUNTER — Inpatient Hospital Stay: Payer: Medicare Other | Attending: Hematology and Oncology

## 2019-07-24 ENCOUNTER — Inpatient Hospital Stay (HOSPITAL_BASED_OUTPATIENT_CLINIC_OR_DEPARTMENT_OTHER): Payer: Medicare Other | Admitting: Hematology and Oncology

## 2019-07-24 VITALS — BP 148/76 | HR 74 | Temp 97.8°F | Resp 18 | Ht 66.0 in | Wt 229.3 lb

## 2019-07-24 DIAGNOSIS — C50911 Malignant neoplasm of unspecified site of right female breast: Secondary | ICD-10-CM | POA: Insufficient documentation

## 2019-07-24 DIAGNOSIS — R0602 Shortness of breath: Secondary | ICD-10-CM

## 2019-07-24 DIAGNOSIS — Z87891 Personal history of nicotine dependence: Secondary | ICD-10-CM | POA: Insufficient documentation

## 2019-07-24 DIAGNOSIS — I110 Hypertensive heart disease with heart failure: Secondary | ICD-10-CM | POA: Diagnosis not present

## 2019-07-24 DIAGNOSIS — Z853 Personal history of malignant neoplasm of breast: Secondary | ICD-10-CM | POA: Insufficient documentation

## 2019-07-24 DIAGNOSIS — G4730 Sleep apnea, unspecified: Secondary | ICD-10-CM | POA: Insufficient documentation

## 2019-07-24 DIAGNOSIS — I509 Heart failure, unspecified: Secondary | ICD-10-CM | POA: Diagnosis not present

## 2019-07-24 DIAGNOSIS — Z803 Family history of malignant neoplasm of breast: Secondary | ICD-10-CM | POA: Insufficient documentation

## 2019-07-24 DIAGNOSIS — M8580 Other specified disorders of bone density and structure, unspecified site: Secondary | ICD-10-CM

## 2019-07-24 DIAGNOSIS — Z170 Estrogen receptor positive status [ER+]: Secondary | ICD-10-CM

## 2019-07-24 DIAGNOSIS — C50411 Malignant neoplasm of upper-outer quadrant of right female breast: Secondary | ICD-10-CM

## 2019-07-24 DIAGNOSIS — C50811 Malignant neoplasm of overlapping sites of right female breast: Secondary | ICD-10-CM | POA: Insufficient documentation

## 2019-07-24 LAB — CBC WITH DIFFERENTIAL/PLATELET
Abs Immature Granulocytes: 0.06 10*3/uL (ref 0.00–0.07)
Basophils Absolute: 0.1 10*3/uL (ref 0.0–0.1)
Basophils Relative: 1 %
Eosinophils Absolute: 0.3 10*3/uL (ref 0.0–0.5)
Eosinophils Relative: 4 %
HCT: 37.3 % (ref 36.0–46.0)
Hemoglobin: 12.2 g/dL (ref 12.0–15.0)
Immature Granulocytes: 1 %
Lymphocytes Relative: 23 %
Lymphs Abs: 1.5 10*3/uL (ref 0.7–4.0)
MCH: 28.6 pg (ref 26.0–34.0)
MCHC: 32.7 g/dL (ref 30.0–36.0)
MCV: 87.4 fL (ref 80.0–100.0)
Monocytes Absolute: 0.6 10*3/uL (ref 0.1–1.0)
Monocytes Relative: 10 %
Neutro Abs: 4 10*3/uL (ref 1.7–7.7)
Neutrophils Relative %: 61 %
Platelets: 230 10*3/uL (ref 150–400)
RBC: 4.27 MIL/uL (ref 3.87–5.11)
RDW: 14.2 % (ref 11.5–15.5)
WBC: 6.4 10*3/uL (ref 4.0–10.5)
nRBC: 0 % (ref 0.0–0.2)

## 2019-07-24 LAB — COMPREHENSIVE METABOLIC PANEL
ALT: 16 U/L (ref 0–44)
AST: 17 U/L (ref 15–41)
Albumin: 4.3 g/dL (ref 3.5–5.0)
Alkaline Phosphatase: 77 U/L (ref 38–126)
Anion gap: 9 (ref 5–15)
BUN: 23 mg/dL (ref 8–23)
CO2: 27 mmol/L (ref 22–32)
Calcium: 9.7 mg/dL (ref 8.9–10.3)
Chloride: 102 mmol/L (ref 98–111)
Creatinine, Ser: 0.87 mg/dL (ref 0.44–1.00)
GFR calc Af Amer: >60 mL/min (ref >60)
GFR calc non Af Amer: >60 mL/min (ref >60)
Glucose, Bld: 105 mg/dL — ABNORMAL HIGH (ref 70–99)
Potassium: 3.8 mmol/L (ref 3.5–5.1)
Sodium: 138 mmol/L (ref 135–145)
Total Bilirubin: 0.4 mg/dL (ref 0.3–1.2)
Total Protein: 7.6 g/dL (ref 6.5–8.1)

## 2019-07-24 NOTE — Patient Instructions (Signed)
Letrozole tablets What is this medicine? LETROZOLE (LET roe zole) blocks the production of estrogen. It is used to treat breast cancer. This medicine may be used for other purposes; ask your health care provider or pharmacist if you have questions. COMMON BRAND NAME(S): Femara What should I tell my health care provider before I take this medicine? They need to know if you have any of these conditions:  high cholesterol  liver disease  osteoporosis (weak bones)  an unusual or allergic reaction to letrozole, other medicines, foods, dyes, or preservatives  pregnant or trying to get pregnant  breast-feeding How should I use this medicine? Take this medicine by mouth with a glass of water. You may take it with or without food. Follow the directions on the prescription label. Take your medicine at regular intervals. Do not take your medicine more often than directed. Do not stop taking except on your doctor's advice. Talk to your pediatrician regarding the use of this medicine in children. Special care may be needed. Overdosage: If you think you have taken too much of this medicine contact a poison control center or emergency room at once. NOTE: This medicine is only for you. Do not share this medicine with others. What if I miss a dose? If you miss a dose, take it as soon as you can. If it is almost time for your next dose, take only that dose. Do not take double or extra doses. What may interact with this medicine? Do not take this medicine with any of the following medications:  estrogens, like hormone replacement therapy or birth control pills This medicine may also interact with the following medications:  dietary supplements such as androstenedione or DHEA  prasterone  tamoxifen This list may not describe all possible interactions. Give your health care provider a list of all the medicines, herbs, non-prescription drugs, or dietary supplements you use. Also tell them if you  smoke, drink alcohol, or use illegal drugs. Some items may interact with your medicine. What should I watch for while using this medicine? Tell your doctor or healthcare professional if your symptoms do not start to get better or if they get worse. Do not become pregnant while taking this medicine or for 3 weeks after stopping it. Women should inform their doctor if they wish to become pregnant or think they might be pregnant. There is a potential for serious side effects to an unborn child. Talk to your health care professional or pharmacist for more information. Do not breast-feed while taking this medicine or for 3 weeks after stopping it. This medicine may interfere with the ability to have a child. Talk with your doctor or health care professional if you are concerned about your fertility. Using this medicine for a long time may increase your risk of low bone mass. Talk to your doctor about bone health. You may get drowsy or dizzy. Do not drive, use machinery, or do anything that needs mental alertness until you know how this medicine affects you. Do not stand or sit up quickly, especially if you are an older patient. This reduces the risk of dizzy or fainting spells. You may need blood work done while you are taking this medicine. What side effects may I notice from receiving this medicine? Side effects that you should report to your doctor or health care professional as soon as possible:  allergic reactions like skin rash, itching, or hives  bone fracture  chest pain  signs and symptoms of a blood clot  such as breathing problems; changes in vision; chest pain; severe, sudden headache; pain, swelling, warmth in the leg; trouble speaking; sudden numbness or weakness of the face, arm or leg  vaginal bleeding Side effects that usually do not require medical attention (report to your doctor or health care professional if they continue or are bothersome):  bone, back, joint, or muscle  pain  dizziness  fatigue  fluid retention  headache  hot flashes, night sweats  nausea  weight gain This list may not describe all possible side effects. Call your doctor for medical advice about side effects. You may report side effects to FDA at 1-800-FDA-1088. Where should I keep my medicine? Keep out of the reach of children. Store between 15 and 30 degrees C (59 and 86 degrees F). Throw away any unused medicine after the expiration date. NOTE: This sheet is a summary. It may not cover all possible information. If you have questions about this medicine, talk to your doctor, pharmacist, or health care provider.  2020 Elsevier/Gold Standard (2015-10-31 11:10:41)   Tamoxifen oral tablet What is this medicine? TAMOXIFEN (ta MOX i fen) blocks the effects of estrogen. It is commonly used to treat breast cancer. It is also used to decrease the chance of breast cancer coming back in women who have received treatment for the disease. It may also help prevent breast cancer in women who have a high risk of developing breast cancer. This medicine may be used for other purposes; ask your health care provider or pharmacist if you have questions. COMMON BRAND NAME(S): Nolvadex What should I tell my health care provider before I take this medicine? They need to know if you have any of these conditions:  blood clots  blood disease  cataracts or impaired eyesight  endometriosis  high calcium levels  high cholesterol  irregular menstrual cycles  liver disease  stroke  uterine fibroids  an unusual reaction to tamoxifen, other medicines, foods, dyes, or preservatives  pregnant or trying to get pregnant  breast-feeding How should I use this medicine? Take this medicine by mouth with a glass of water. Follow the directions on the prescription label. You can take it with or without food. Take your medicine at regular intervals. Do not take your medicine more often than directed.  Do not stop taking except on your doctor's advice. A special MedGuide will be given to you by the pharmacist with each prescription and refill. Be sure to read this information carefully each time. Talk to your pediatrician regarding the use of this medicine in children. While this drug may be prescribed for selected conditions, precautions do apply. Overdosage: If you think you have taken too much of this medicine contact a poison control center or emergency room at once. NOTE: This medicine is only for you. Do not share this medicine with others. What if I miss a dose? If you miss a dose, take it as soon as you can. If it is almost time for your next dose, take only that dose. Do not take double or extra doses. What may interact with this medicine? Do not take this medicine with any of the following medications:  cisapride  certain medicines for irregular heart beat like dronedarone, quinidine  certain medicines for fungal infection like fluconazole, posaconazole  pimozide  saquinavir  thioridazine This medicine may also interact with the following medications:  aminoglutethimide  anastrozole  bromocriptine  chemotherapy drugs  dofetilide  female hormones, like estrogens and birth control pills  letrozole  medroxyprogesterone  phenobarbital  rifampin  warfarin This list may not describe all possible interactions. Give your health care provider a list of all the medicines, herbs, non-prescription drugs, or dietary supplements you use. Also tell them if you smoke, drink alcohol, or use illegal drugs. Some items may interact with your medicine. What should I watch for while using this medicine? Visit your doctor or health care professional for regular checks on your progress. You will need regular pelvic exams, breast exams, and mammograms. If you are taking this medicine to reduce your risk of getting breast cancer, you should know that this medicine does not prevent all  types of breast cancer. If breast cancer or other problems occur, there is no guarantee that it will be found at an early stage. Do not become pregnant while taking this medicine or for 2 months after stopping it. Women should inform their doctor if they wish to become pregnant or think they might be pregnant. There is a potential for serious side effects to an unborn child. Talk to your health care professional or pharmacist for more information. Do not breast-feed an infant while taking this medicine or for 3 months after stopping it. This medicine may interfere with the ability to have a child. Talk with your doctor or health care professional if you are concerned about your fertility. What side effects may I notice from receiving this medicine? Side effects that you should report to your doctor or health care professional as soon as possible:  allergic reactions like skin rash, itching or hives, swelling of the face, lips, or tongue  changes in vision  changes in your menstrual cycle  difficulty walking or talking  new breast lumps  numbness  pelvic pain or pressure  redness, blistering, peeling or loosening of the skin, including inside the mouth  signs and symptoms of a dangerous change in heartbeat or heart rhythm like chest pain, dizziness, fast or irregular heartbeat, palpitations, feeling faint or lightheaded, falls, breathing problems  sudden chest pain  swelling, pain or tenderness in your calf or leg  unusual bruising or bleeding  vaginal discharge that is bloody, brown, or rust  weakness  yellowing of the whites of the eyes or skin Side effects that usually do not require medical attention (report to your doctor or health care professional if they continue or are bothersome):  fatigue  hair loss, although uncommon and is usually mild  headache  hot flashes  impotence (in men)  nausea, vomiting (mild)  vaginal discharge (white or clear) This list may not  describe all possible side effects. Call your doctor for medical advice about side effects. You may report side effects to FDA at 1-800-FDA-1088. Where should I keep my medicine? Keep out of the reach of children. Store at room temperature between 20 and 25 degrees C (68 and 77 degrees F). Protect from light. Keep container tightly closed. Throw away any unused medicine after the expiration date. NOTE: This sheet is a summary. It may not cover all possible information. If you have questions about this medicine, talk to your doctor, pharmacist, or health care provider.  2020 Elsevier/Gold Standard (2018-03-18 11:15:31)

## 2019-07-25 LAB — CANCER ANTIGEN 27.29: CA 27.29: 19 U/mL (ref 0.0–38.6)

## 2019-07-31 ENCOUNTER — Encounter
Admission: RE | Admit: 2019-07-31 | Discharge: 2019-07-31 | Disposition: A | Discharge status: Home / Self Care | Payer: Medicare Other | Source: Ambulatory Visit | Attending: Surgery | Admitting: Surgery

## 2019-07-31 ENCOUNTER — Other Ambulatory Visit: Payer: Self-pay

## 2019-07-31 DIAGNOSIS — I10 Essential (primary) hypertension: Secondary | ICD-10-CM | POA: Diagnosis not present

## 2019-07-31 DIAGNOSIS — Z01818 Encounter for other preprocedural examination: Secondary | ICD-10-CM | POA: Insufficient documentation

## 2019-07-31 HISTORY — DX: Dyspnea, unspecified: R06.00

## 2019-07-31 HISTORY — DX: Nausea with vomiting, unspecified: R11.2

## 2019-07-31 HISTORY — DX: Malignant (primary) neoplasm, unspecified: C80.1

## 2019-07-31 HISTORY — DX: Other specified postprocedural states: Z98.890

## 2019-07-31 HISTORY — DX: Unspecified urinary incontinence: R32

## 2019-07-31 NOTE — Patient Instructions (Signed)
Your procedure is scheduled on: Thurs 5/6 Report to nuclear med as instructed per [facility] .  Remember: Instructions that are not followed completely may result in serious medical risk,  up to and including death, or upon the discretion of your surgeon and anesthesiologist your  surgery may need to be rescheduled.     _X__ 1. Do not eat food after midnight the night before your procedure.                 No gum chewing or hard candies. You may drink clear liquids up to 2 hours                 before you are scheduled to arrive for your surgery- DO not drink clear                 liquids within 2 hours of the start of your surgery.                 Clear Liquids include:  water, apple juice without pulp, clear Gatorade, G2 or                  Gatorade Zero (avoid Red/Purple/Blue), Black Coffee or Tea (Do not add                 anything to coffee or tea). _____2.   Complete the carbohydrate drink provided to you, 2 hours before arrival.  __X__2.  On the morning of surgery brush your teeth with toothpaste and water, you                may rinse your mouth with mouthwash if you wish.  Do not swallow any toothpaste of mouthwash.     __ 3.  No Alcohol for 24 hours before or after surgery.   ___ 4.  Do Not Smoke or use e-cigarettes For 24 Hours Prior to Your Surgery.                 Do not use any chewable tobacco products for at least 6 hours prior to                 Surgery.  _X__  5.  Do not use any recreational drugs (marijuana, cocaine, heroin, ecstacy, MDMA or other)                For at least one week prior to your surgery.  Combination of these drugs with anesthesia                May have life threatening results.  ____  6.  Bring all medications with you on the day of surgery if instructed.   __x__  7.  Notify your doctor if there is any change in your medical condition      (cold, fever, infections).     Do not wear jewelry, make-up,  hairpins, clips or nail polish. Do not wear lotions, powders, or perfumes.  Do not shave 48 hours prior to surgery. Do not bring valuables to the hospital.    [facility] is not responsible for any belongings or valuables.  Contacts, dentures or bridgework may not be worn into surgery. Leave your suitcase in the car. After surgery it may be brought to your room. For patients admitted to the hospital, discharge time is determined by your treatment team.   Patients discharged the day of surgery will not be allowed to drive home.   Make  arrangements for someone to be with you for the first 24 hours of your Same Day Discharge.    Please read over the following fact sheets that you were given:    __x__ Take these medicines the morning of surgery with A SIP OF WATER:    1. amLODipine (NORVASC) 5 MG tablet  2. metoprolol succinate (TOPROL-XL) 25 MG 24 hr tablet  3. pantoprazole (PROTONIX) 40 MG tablet  4.  5.  6.  ____ Fleet Enema (as directed)   _x___ Use CHG Soap (or wipes) as directed  ____ Use Benzoyl Peroxide Gel as instructed  ____ Use inhalers on the day of surgery  ____ Stop metformin 2 days prior to surgery    ____ Take 1/2 of usual insulin dose the night before surgery. No insulin the morning          of surgery.   __x__ Stopped aspirin on 4/2.  Will not resume  __x__ Stop Anti-inflammatories  ibuprofen (IBU-200) 200 MG tablet, aleve or aspirin products on 4/29   __x__ Stop supplements on 4/29  St Johns Wort 300 MG CAPS,Red Yeast Rice Extract (RED YEAST RICE PO, Biotin 5000 MCG TABS  ____ Bring C-Pap to the hospital.

## 2019-07-31 NOTE — Pre-Procedure Instructions (Signed)
Patient instructed to arrive at 9:00 on the day of surgery and go to radiology for nuclear med. sentinel node.  Verbalizes understanding.

## 2019-08-04 ENCOUNTER — Encounter
Admission: RE | Admit: 2019-08-04 | Discharge: 2019-08-04 | Disposition: A | Discharge status: Home / Self Care | Payer: Medicare Other | Source: Ambulatory Visit | Attending: *Deleted | Admitting: *Deleted

## 2019-08-04 ENCOUNTER — Other Ambulatory Visit: Payer: Self-pay

## 2019-08-04 ENCOUNTER — Ambulatory Visit
Admission: RE | Admit: 2019-08-04 | Discharge: 2019-08-04 | Disposition: A | Discharge status: Home / Self Care | Payer: Medicare Other | Source: Ambulatory Visit | Attending: Hematology and Oncology | Admitting: Hematology and Oncology

## 2019-08-04 DIAGNOSIS — Z170 Estrogen receptor positive status [ER+]: Secondary | ICD-10-CM | POA: Diagnosis not present

## 2019-08-04 DIAGNOSIS — C50411 Malignant neoplasm of upper-outer quadrant of right female breast: Secondary | ICD-10-CM | POA: Insufficient documentation

## 2019-08-04 DIAGNOSIS — M8580 Other specified disorders of bone density and structure, unspecified site: Secondary | ICD-10-CM | POA: Diagnosis not present

## 2019-08-04 DIAGNOSIS — M8589 Other specified disorders of bone density and structure, multiple sites: Secondary | ICD-10-CM | POA: Diagnosis not present

## 2019-08-06 ENCOUNTER — Ambulatory Visit
Admission: RE | Admit: 2019-08-06 | Discharge: 2019-08-06 | Disposition: A | Discharge status: Home / Self Care | Payer: Medicare Other | Source: Ambulatory Visit | Attending: Surgery | Admitting: Surgery

## 2019-08-06 ENCOUNTER — Other Ambulatory Visit: Payer: Self-pay | Admitting: Surgery

## 2019-08-06 DIAGNOSIS — C50911 Malignant neoplasm of unspecified site of right female breast: Secondary | ICD-10-CM | POA: Diagnosis not present

## 2019-08-06 DIAGNOSIS — C50411 Malignant neoplasm of upper-outer quadrant of right female breast: Secondary | ICD-10-CM | POA: Diagnosis not present

## 2019-08-10 DIAGNOSIS — R0600 Dyspnea, unspecified: Secondary | ICD-10-CM | POA: Diagnosis not present

## 2019-08-10 NOTE — Addendum Note (Signed)
Encounter addended by: Helene Kelp, RT on: 08/10/2019 12:16 PM  Actions taken: Imaging Exam begun, Image imported

## 2019-08-11 ENCOUNTER — Other Ambulatory Visit
Admission: RE | Admit: 2019-08-11 | Discharge: 2019-08-11 | Disposition: A | Discharge status: Home / Self Care | Payer: Medicare Other | Source: Ambulatory Visit | Attending: Surgery | Admitting: Surgery

## 2019-08-11 ENCOUNTER — Other Ambulatory Visit: Payer: Self-pay

## 2019-08-11 DIAGNOSIS — Z20822 Contact with and (suspected) exposure to covid-19: Secondary | ICD-10-CM | POA: Diagnosis not present

## 2019-08-11 DIAGNOSIS — Z01812 Encounter for preprocedural laboratory examination: Secondary | ICD-10-CM | POA: Diagnosis not present

## 2019-08-11 LAB — SARS CORONAVIRUS 2 (TAT 6-24 HRS): SARS Coronavirus 2: NEGATIVE

## 2019-08-12 ENCOUNTER — Other Ambulatory Visit: Payer: Self-pay

## 2019-08-12 DIAGNOSIS — Z6836 Body mass index (BMI) 36.0-36.9, adult: Secondary | ICD-10-CM | POA: Diagnosis not present

## 2019-08-12 DIAGNOSIS — Z79899 Other long term (current) drug therapy: Secondary | ICD-10-CM | POA: Diagnosis not present

## 2019-08-12 DIAGNOSIS — J449 Chronic obstructive pulmonary disease, unspecified: Secondary | ICD-10-CM | POA: Diagnosis not present

## 2019-08-12 DIAGNOSIS — I509 Heart failure, unspecified: Secondary | ICD-10-CM | POA: Diagnosis not present

## 2019-08-12 DIAGNOSIS — C50411 Malignant neoplasm of upper-outer quadrant of right female breast: Secondary | ICD-10-CM | POA: Diagnosis not present

## 2019-08-12 DIAGNOSIS — Z87891 Personal history of nicotine dependence: Secondary | ICD-10-CM | POA: Diagnosis not present

## 2019-08-12 DIAGNOSIS — E669 Obesity, unspecified: Secondary | ICD-10-CM | POA: Diagnosis not present

## 2019-08-12 DIAGNOSIS — R791 Abnormal coagulation profile: Secondary | ICD-10-CM | POA: Diagnosis not present

## 2019-08-12 DIAGNOSIS — Z7982 Long term (current) use of aspirin: Secondary | ICD-10-CM | POA: Diagnosis not present

## 2019-08-12 DIAGNOSIS — C50911 Malignant neoplasm of unspecified site of right female breast: Secondary | ICD-10-CM | POA: Insufficient documentation

## 2019-08-12 DIAGNOSIS — Z91041 Radiographic dye allergy status: Secondary | ICD-10-CM | POA: Diagnosis not present

## 2019-08-12 DIAGNOSIS — G4730 Sleep apnea, unspecified: Secondary | ICD-10-CM | POA: Diagnosis not present

## 2019-08-12 DIAGNOSIS — I110 Hypertensive heart disease with heart failure: Secondary | ICD-10-CM | POA: Insufficient documentation

## 2019-08-12 DIAGNOSIS — R7989 Other specified abnormal findings of blood chemistry: Secondary | ICD-10-CM | POA: Diagnosis not present

## 2019-08-12 DIAGNOSIS — Z170 Estrogen receptor positive status [ER+]: Secondary | ICD-10-CM | POA: Insufficient documentation

## 2019-08-12 DIAGNOSIS — N6092 Unspecified benign mammary dysplasia of left breast: Secondary | ICD-10-CM | POA: Diagnosis not present

## 2019-08-12 LAB — COMPREHENSIVE METABOLIC PANEL
ALT: 18 U/L (ref 0–44)
AST: 18 U/L (ref 15–41)
Albumin: 4.3 g/dL (ref 3.5–5.0)
Alkaline Phosphatase: 74 U/L (ref 38–126)
Anion gap: 8 (ref 5–15)
BUN: 23 mg/dL (ref 8–23)
CO2: 28 mmol/L (ref 22–32)
Calcium: 9.6 mg/dL (ref 8.9–10.3)
Chloride: 100 mmol/L (ref 98–111)
Creatinine, Ser: 0.79 mg/dL (ref 0.44–1.00)
GFR calc Af Amer: >60 mL/min (ref >60)
GFR calc non Af Amer: >60 mL/min (ref >60)
Glucose, Bld: 119 mg/dL — ABNORMAL HIGH (ref 70–99)
Potassium: 3.5 mmol/L (ref 3.5–5.1)
Sodium: 136 mmol/L (ref 135–145)
Total Bilirubin: 0.7 mg/dL (ref 0.3–1.2)
Total Protein: 7.6 g/dL (ref 6.5–8.1)

## 2019-08-12 LAB — CBC
HCT: 37.2 % (ref 36.0–46.0)
Hemoglobin: 12.4 g/dL (ref 12.0–15.0)
MCH: 28.9 pg (ref 26.0–34.0)
MCHC: 33.3 g/dL (ref 30.0–36.0)
MCV: 86.7 fL (ref 80.0–100.0)
Platelets: 241 10*3/uL (ref 150–400)
RBC: 4.29 MIL/uL (ref 3.87–5.11)
RDW: 14.1 % (ref 11.5–15.5)
WBC: 8.8 10*3/uL (ref 4.0–10.5)
nRBC: 0 % (ref 0.0–0.2)

## 2019-08-12 LAB — TROPONIN I (HIGH SENSITIVITY): Troponin I (High Sensitivity): 4 ng/L (ref <18)

## 2019-08-12 NOTE — ED Triage Notes (Signed)
Pt was sent here for abnormal D-dimer, states had preop blood work done on Monday. Pt is having lumpectomy done tomorrow due to cancerous lump to right breast that they found 3 weeks ago. Pt has no history of clots, has had shob for few months. Sent here to rule out PE.

## 2019-08-12 NOTE — Progress Notes (Signed)
Patient scheduled for lumpectomy 08/13/19.  Called with concerns about recent lab work/pulmonology visit.  Contacted her physicians to determine best plan of care.  Dr. [name] to contact patient.

## 2019-08-13 ENCOUNTER — Emergency Department: Payer: Medicare Other

## 2019-08-13 ENCOUNTER — Ambulatory Visit
Admission: RE | Admit: 2019-08-13 | Discharge: 2019-08-13 | Disposition: A | Discharge status: Home / Self Care | Payer: Medicare Other | Attending: Surgery | Admitting: Surgery

## 2019-08-13 ENCOUNTER — Encounter
Admission: RE | Disposition: A | Discharge status: Home / Self Care | Payer: Self-pay | Source: Home / Self Care | Attending: Surgery

## 2019-08-13 ENCOUNTER — Ambulatory Visit
Admission: RE | Admit: 2019-08-13 | Discharge: 2019-08-13 | Disposition: A | Discharge status: Home / Self Care | Payer: Medicare Other | Source: Ambulatory Visit | Attending: Surgery | Admitting: Surgery

## 2019-08-13 ENCOUNTER — Encounter: Payer: Self-pay | Admitting: Surgery

## 2019-08-13 ENCOUNTER — Ambulatory Visit: Payer: Medicare Other | Admitting: Anesthesiology

## 2019-08-13 ENCOUNTER — Emergency Department
Admission: EM | Admit: 2019-08-13 | Discharge: 2019-08-13 | Disposition: A | Discharge status: Home / Self Care | Payer: Medicare Other | Source: Home / Self Care | Attending: Emergency Medicine | Admitting: Emergency Medicine

## 2019-08-13 DIAGNOSIS — Z91041 Radiographic dye allergy status: Secondary | ICD-10-CM | POA: Insufficient documentation

## 2019-08-13 DIAGNOSIS — N6092 Unspecified benign mammary dysplasia of left breast: Secondary | ICD-10-CM | POA: Insufficient documentation

## 2019-08-13 DIAGNOSIS — R7989 Other specified abnormal findings of blood chemistry: Secondary | ICD-10-CM | POA: Insufficient documentation

## 2019-08-13 DIAGNOSIS — K219 Gastro-esophageal reflux disease without esophagitis: Secondary | ICD-10-CM | POA: Diagnosis not present

## 2019-08-13 DIAGNOSIS — C50411 Malignant neoplasm of upper-outer quadrant of right female breast: Secondary | ICD-10-CM | POA: Diagnosis not present

## 2019-08-13 DIAGNOSIS — R791 Abnormal coagulation profile: Secondary | ICD-10-CM | POA: Diagnosis not present

## 2019-08-13 DIAGNOSIS — Z79899 Other long term (current) drug therapy: Secondary | ICD-10-CM | POA: Insufficient documentation

## 2019-08-13 DIAGNOSIS — Z170 Estrogen receptor positive status [ER+]: Secondary | ICD-10-CM

## 2019-08-13 DIAGNOSIS — I509 Heart failure, unspecified: Secondary | ICD-10-CM | POA: Insufficient documentation

## 2019-08-13 DIAGNOSIS — Z87891 Personal history of nicotine dependence: Secondary | ICD-10-CM | POA: Insufficient documentation

## 2019-08-13 DIAGNOSIS — J449 Chronic obstructive pulmonary disease, unspecified: Secondary | ICD-10-CM | POA: Insufficient documentation

## 2019-08-13 DIAGNOSIS — C50911 Malignant neoplasm of unspecified site of right female breast: Secondary | ICD-10-CM | POA: Diagnosis not present

## 2019-08-13 DIAGNOSIS — Z6836 Body mass index (BMI) 36.0-36.9, adult: Secondary | ICD-10-CM | POA: Insufficient documentation

## 2019-08-13 DIAGNOSIS — I110 Hypertensive heart disease with heart failure: Secondary | ICD-10-CM | POA: Diagnosis not present

## 2019-08-13 DIAGNOSIS — G4730 Sleep apnea, unspecified: Secondary | ICD-10-CM | POA: Insufficient documentation

## 2019-08-13 DIAGNOSIS — Z7982 Long term (current) use of aspirin: Secondary | ICD-10-CM | POA: Insufficient documentation

## 2019-08-13 DIAGNOSIS — E785 Hyperlipidemia, unspecified: Secondary | ICD-10-CM | POA: Diagnosis not present

## 2019-08-13 DIAGNOSIS — E669 Obesity, unspecified: Secondary | ICD-10-CM | POA: Insufficient documentation

## 2019-08-13 HISTORY — PX: PART MASTECTOMY,RADIO FREQUENCY LOCALIZER,AXILLARY SENTINEL NODE BIOPSY: SHX6901

## 2019-08-13 SURGERY — PART MASTECTOMY,RADIO FREQUENCY LOCALIZER,AXILLARY SENTINEL NODE BIOPSY
Anesthesia: General | Laterality: Right

## 2019-08-13 MED ORDER — LIDOCAINE HCL (PF) 2 % IJ SOLN
INTRAMUSCULAR | Status: AC
  Filled 2019-08-13: qty 5

## 2019-08-13 MED ORDER — DEXAMETHASONE SODIUM PHOSPHATE 10 MG/ML IJ SOLN
INTRAMUSCULAR | Status: AC
  Filled 2019-08-13: qty 1

## 2019-08-13 MED ORDER — ACETAMINOPHEN 10 MG/ML IV SOLN
INTRAVENOUS | Status: AC
  Filled 2019-08-13: qty 100

## 2019-08-13 MED ORDER — LACTATED RINGERS IV SOLN: INTRAVENOUS | Status: DC

## 2019-08-13 MED ORDER — ONDANSETRON HCL 4 MG/2ML IJ SOLN: 4.0000 mg | Freq: Once | INTRAMUSCULAR | Status: DC | PRN

## 2019-08-13 MED ORDER — OXYCODONE HCL 5 MG/5ML PO SOLN: 5.0000 mg | Freq: Once | ORAL | Status: DC | PRN

## 2019-08-13 MED ORDER — CHLORHEXIDINE GLUCONATE CLOTH 2 % EX PADS: 6.0000 | MEDICATED_PAD | Freq: Once | CUTANEOUS | Status: DC

## 2019-08-13 MED ORDER — PROPOFOL 10 MG/ML IV BOLUS
INTRAVENOUS | Status: AC
  Filled 2019-08-13: qty 20

## 2019-08-13 MED ORDER — KETOROLAC TROMETHAMINE 30 MG/ML IJ SOLN
INTRAMUSCULAR | Status: AC
  Filled 2019-08-13: qty 1

## 2019-08-13 MED ORDER — PROPOFOL 10 MG/ML IV BOLUS
INTRAVENOUS | Status: AC
  Filled 2019-08-13: qty 40

## 2019-08-13 MED ORDER — HYDROCORTISONE NA SUCCINATE PF 250 MG IJ SOLR
200.0000 mg | Freq: Once | INTRAMUSCULAR | Status: AC
  Administered 2019-08-13: 02:00:00 200 mg via INTRAVENOUS
  Filled 2019-08-13: qty 200

## 2019-08-13 MED ORDER — ONDANSETRON HCL 4 MG/2ML IJ SOLN
INTRAMUSCULAR | Status: DC | PRN
  Administered 2019-08-13: 4 mg via INTRAVENOUS

## 2019-08-13 MED ORDER — BUPIVACAINE HCL (PF) 0.5 % IJ SOLN
INTRAMUSCULAR | Status: AC
  Filled 2019-08-13: qty 30

## 2019-08-13 MED ORDER — ACETAMINOPHEN 500 MG PO TABS
ORAL_TABLET | ORAL | Status: AC
  Administered 2019-08-13: 1000 mg via ORAL
  Filled 2019-08-13: qty 2

## 2019-08-13 MED ORDER — CLINDAMYCIN PHOSPHATE 900 MG/50ML IV SOLN
INTRAVENOUS | Status: AC
  Filled 2019-08-13: qty 50

## 2019-08-13 MED ORDER — CLINDAMYCIN PHOSPHATE 900 MG/50ML IV SOLN
900.0000 mg | INTRAVENOUS | Status: AC
  Administered 2019-08-13: 900 mg via INTRAVENOUS

## 2019-08-13 MED ORDER — OXYCODONE HCL 5 MG PO TABS: 5.0000 mg | ORAL_TABLET | Freq: Once | ORAL | Status: DC | PRN

## 2019-08-13 MED ORDER — MIDAZOLAM HCL 2 MG/2ML IJ SOLN
INTRAMUSCULAR | Status: AC
  Filled 2019-08-13: qty 2

## 2019-08-13 MED ORDER — DOCUSATE SODIUM 100 MG PO CAPS: 100.0000 mg | ORAL_CAPSULE | Freq: Two times a day (BID) | ORAL | 0 refills | Status: AC | PRN

## 2019-08-13 MED ORDER — DEXMEDETOMIDINE HCL 200 MCG/2ML IV SOLN
INTRAVENOUS | Status: DC | PRN
  Administered 2019-08-13 (×2): 8 ug via INTRAVENOUS

## 2019-08-13 MED ORDER — PROPOFOL 500 MG/50ML IV EMUL
INTRAVENOUS | Status: DC | PRN
  Administered 2019-08-13: 150 ug/kg/min via INTRAVENOUS

## 2019-08-13 MED ORDER — LIDOCAINE HCL (PF) 1 % IJ SOLN
INTRAMUSCULAR | Status: DC | PRN
  Administered 2019-08-13: 15 mL

## 2019-08-13 MED ORDER — IOHEXOL 350 MG/ML SOLN
75.0000 mL | Freq: Once | INTRAVENOUS | Status: AC | PRN
  Administered 2019-08-13: 06:00:00 75 mL via INTRAVENOUS

## 2019-08-13 MED ORDER — FENTANYL CITRATE (PF) 100 MCG/2ML IJ SOLN
INTRAMUSCULAR | Status: AC
  Filled 2019-08-13: qty 2

## 2019-08-13 MED ORDER — ACETAMINOPHEN 500 MG PO TABS: 1000.0000 mg | ORAL_TABLET | ORAL | Status: AC

## 2019-08-13 MED ORDER — DEXMEDETOMIDINE HCL IN NACL 80 MCG/20ML IV SOLN
INTRAVENOUS | Status: AC
  Filled 2019-08-13: qty 20

## 2019-08-13 MED ORDER — DEXAMETHASONE SODIUM PHOSPHATE 10 MG/ML IJ SOLN
INTRAMUSCULAR | Status: DC | PRN
  Administered 2019-08-13: 10 mg via INTRAVENOUS

## 2019-08-13 MED ORDER — LIDOCAINE HCL (CARDIAC) PF 100 MG/5ML IV SOSY
PREFILLED_SYRINGE | INTRAVENOUS | Status: DC | PRN
  Administered 2019-08-13: 100 mg via INTRAVENOUS

## 2019-08-13 MED ORDER — SUCCINYLCHOLINE CHLORIDE 20 MG/ML IJ SOLN
INTRAMUSCULAR | Status: DC | PRN
  Administered 2019-08-13: 100 mg via INTRAVENOUS

## 2019-08-13 MED ORDER — FENTANYL CITRATE (PF) 100 MCG/2ML IJ SOLN: 25.0000 ug | INTRAMUSCULAR | Status: DC | PRN

## 2019-08-13 MED ORDER — DIPHENHYDRAMINE HCL 50 MG/ML IJ SOLN
50.0000 mg | Freq: Once | INTRAMUSCULAR | Status: DC
Start: 2019-08-13 — End: 2019-08-13
  Filled 2019-08-13 (×2): qty 1

## 2019-08-13 MED ORDER — PROPOFOL 500 MG/50ML IV EMUL
INTRAVENOUS | Status: AC
  Filled 2019-08-13: qty 50

## 2019-08-13 MED ORDER — HYDROCODONE-ACETAMINOPHEN 5-325 MG PO TABS: 1.0000 | ORAL_TABLET | Freq: Four times a day (QID) | ORAL | 0 refills | Status: DC | PRN

## 2019-08-13 MED ORDER — BUPIVACAINE-EPINEPHRINE 0.5% -1:200000 IJ SOLN
INTRAMUSCULAR | Status: DC | PRN
  Administered 2019-08-13: 15 mL

## 2019-08-13 MED ORDER — LIDOCAINE HCL (PF) 1 % IJ SOLN
INTRAMUSCULAR | Status: AC
  Filled 2019-08-13: qty 30

## 2019-08-13 MED ORDER — ONDANSETRON HCL 4 MG/2ML IJ SOLN
INTRAMUSCULAR | Status: AC
  Filled 2019-08-13: qty 2

## 2019-08-13 MED ORDER — DIPHENHYDRAMINE HCL 25 MG PO CAPS
50.0000 mg | ORAL_CAPSULE | Freq: Once | ORAL | Status: DC
Start: 2019-08-13 — End: 2019-08-13
  Filled 2019-08-13: qty 2

## 2019-08-13 MED ORDER — ACETAMINOPHEN 325 MG PO TABS: 650.0000 mg | ORAL_TABLET | Freq: Three times a day (TID) | ORAL | 0 refills | Status: AC | PRN

## 2019-08-13 MED ORDER — PHENYLEPHRINE HCL (PRESSORS) 10 MG/ML IV SOLN
INTRAVENOUS | Status: DC | PRN
  Administered 2019-08-13 (×3): 100 ug via INTRAVENOUS

## 2019-08-13 MED ORDER — EPINEPHRINE PF 1 MG/ML IJ SOLN
INTRAMUSCULAR | Status: AC
  Filled 2019-08-13: qty 1

## 2019-08-13 MED ORDER — PROPOFOL 10 MG/ML IV BOLUS
INTRAVENOUS | Status: DC | PRN
  Administered 2019-08-13 (×2): 50 mg via INTRAVENOUS
  Administered 2019-08-13: 60 mg via INTRAVENOUS
  Administered 2019-08-13: 150 mg via INTRAVENOUS
  Administered 2019-08-13: 40 mg via INTRAVENOUS

## 2019-08-13 MED ORDER — TECHNETIUM TC 99M SULFUR COLLOID FILTERED
1.0000 | Freq: Once | INTRAVENOUS | Status: AC | PRN
  Administered 2019-08-13: 0.696 via INTRADERMAL

## 2019-08-13 MED ORDER — FENTANYL CITRATE (PF) 100 MCG/2ML IJ SOLN
INTRAMUSCULAR | Status: DC | PRN
  Administered 2019-08-13 (×2): 50 ug via INTRAVENOUS

## 2019-08-13 MED ORDER — KETOROLAC TROMETHAMINE 30 MG/ML IJ SOLN
INTRAMUSCULAR | Status: DC | PRN
  Administered 2019-08-13: 30 mg via INTRAVENOUS

## 2019-08-13 SURGICAL SUPPLY — 47 items
APPLIER CLIP 11 MED OPEN (CLIP)
BLADE SURG 15 STRL LF DISP TIS (BLADE) ×1 IMPLANT
BLADE SURG 15 STRL SS (BLADE) ×2
CANISTER SUCT 1200ML W/VALVE (MISCELLANEOUS) ×3 IMPLANT
CHLORAPREP W/TINT 26 (MISCELLANEOUS) ×3 IMPLANT
CLIP APPLIE 11 MED OPEN (CLIP) IMPLANT
CNTNR SPEC 2.5X3XGRAD LEK (MISCELLANEOUS)
CONT SPEC 4OZ STER OR WHT (MISCELLANEOUS)
CONTAINER SPEC 2.5X3XGRAD LEK (MISCELLANEOUS) IMPLANT
COVER WAND RF STERILE (DRAPES) ×3 IMPLANT
DERMABOND ADVANCED (GAUZE/BANDAGES/DRESSINGS) ×4
DERMABOND ADVANCED .7 DNX12 (GAUZE/BANDAGES/DRESSINGS) ×2 IMPLANT
DEVICE DSSCT PLSMBLD 3.0S LGHT (MISCELLANEOUS) ×1 IMPLANT
DEVICE DUBIN SPECIMEN MAMMOGRA (MISCELLANEOUS) ×3 IMPLANT
DRAPE LAPAROTOMY TRNSV 106X77 (MISCELLANEOUS) ×3 IMPLANT
ELECT CAUTERY BLADE TIP 2.5 (TIP) ×3
ELECT REM PT RETURN 9FT ADLT (ELECTROSURGICAL) ×3
ELECTRODE CAUTERY BLDE TIP 2.5 (TIP) ×1 IMPLANT
ELECTRODE REM PT RTRN 9FT ADLT (ELECTROSURGICAL) ×1 IMPLANT
GLOVE BIOGEL PI IND STRL 7.0 (GLOVE) ×1 IMPLANT
GLOVE BIOGEL PI INDICATOR 7.0 (GLOVE) ×2
GLOVE SURG SYN 6.5 ES PF (GLOVE) ×3 IMPLANT
GOWN STRL REUS W/ TWL LRG LVL3 (GOWN DISPOSABLE) ×3 IMPLANT
GOWN STRL REUS W/TWL LRG LVL3 (GOWN DISPOSABLE) ×6
JACKSON PRATT 10 (INSTRUMENTS) IMPLANT
KIT MARKER MARGIN INK (KITS) ×3 IMPLANT
KIT TURNOVER KIT A (KITS) ×3 IMPLANT
LABEL OR SOLS (LABEL) ×3 IMPLANT
LIGHT WAVEGUIDE WIDE FLAT (MISCELLANEOUS) ×3 IMPLANT
MARKER MARGIN CORRECT CLIP (MARKER) ×3 IMPLANT
NEEDLE HYPO 22GX1.5 SAFETY (NEEDLE) ×6 IMPLANT
PACK BASIN MINOR (MISCELLANEOUS) ×3 IMPLANT
PLASMABLADE 3.0S W/LIGHT (MISCELLANEOUS) ×3
SET LOCALIZER 20 PROBE US (MISCELLANEOUS) ×3 IMPLANT
SLEVE PROBE SENORX GAMMA FIND (MISCELLANEOUS) ×3 IMPLANT
SUT MNCRL 4-0 (SUTURE) ×4
SUT MNCRL 4-0 27XMFL (SUTURE) ×2
SUT SILK 2 0 (SUTURE)
SUT SILK 2-0 30XBRD TIE 12 (SUTURE) IMPLANT
SUT SILK 3 0 12 30 (SUTURE) ×3 IMPLANT
SUT VIC AB 3-0 SH 27 (SUTURE) ×4
SUT VIC AB 3-0 SH 27X BRD (SUTURE) ×2 IMPLANT
SUTURE MNCRL 4-0 27XMF (SUTURE) ×2 IMPLANT
SYR 20ML LL LF (SYRINGE) ×3 IMPLANT
TUBING CONNECTING 10 (TUBING) ×2 IMPLANT
TUBING CONNECTING 10' (TUBING) ×1
WATER STERILE IRR 1000ML POUR (IV SOLUTION) ×3 IMPLANT

## 2019-08-13 NOTE — ED Notes (Signed)
Pt up to restroom with steady gait. voided large amount. Assisted back to stretcher and provided for safety and comfort and will continue to assess.

## 2019-08-13 NOTE — Interval H&P Note (Signed)
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  The various methods of treatment have been discussed with the patient and family. After consideration of risks, benefits and other options for treatment, the patient has consented to  Procedure(s): PART [place] (Right) as a surgical intervention.  The patient's history has been reviewed, patient examined, stable for surgery. Elevated d-dimer noted but CTA negative for PE prior to surgery.  I have reviewed the patient's chart and labs.  Questions were answered to the patient's satisfaction.     Sharmel Ballantine [name]

## 2019-08-13 NOTE — ED Notes (Signed)
Pt to CT

## 2019-08-13 NOTE — Discharge Instructions (Addendum)
Your lab work was reassuring today and your CTA chest did not demonstrate any evidence of pulmonary embolism or any other explanation for your chronic shortness of breath.  Please follow-up with your regular doctors as scheduled.

## 2019-08-13 NOTE — Anesthesia Procedure Notes (Signed)
Procedure Name: Intubation Date/Time: 08/13/2019 10:40 AM Performed by: Caryl Asp, CRNA Pre-anesthesia Checklist: Patient identified, Patient being monitored, Timeout performed, Emergency Drugs available and Suction available Patient Re-evaluated:Patient Re-evaluated prior to induction Oxygen Delivery Method: Circle system utilized Preoxygenation: Pre-oxygenation with 100% oxygen Induction Type: IV induction Ventilation: Mask ventilation without difficulty and Oral airway inserted - appropriate to patient size Laryngoscope Size: 3 and McGraph Grade View: Grade I Tube type: Oral Tube size: 7.0 mm Number of attempts: 1 Airway Equipment and Method: Stylet Placement Confirmation: ETT inserted through vocal cords under direct vision,  positive ETCO2 and breath sounds checked- equal and bilateral Secured at: 22 cm Tube secured with: Tape Dental Injury: Teeth and Oropharynx as per pre-operative assessment

## 2019-08-13 NOTE — ED Provider Notes (Signed)
[facility] Emergency Department Provider Note  ____________________________________________   First MD Initiated Contact with Patient 08/13/19 [phone]     (approximate)  I have reviewed the triage vital signs and the nursing notes.   HISTORY  Chief Complaint Abnormal Lab    HPI Melissa Merritt is a 69 y.o. female with medical history that includes recent diagnosis of cancer to her right breast who is going tomorrow morning for a procedure for further assessment of the breast mass.  She presents tonight on the recommendation of her surgeon, Dr. [name], who is following up on abnormal lab work.  She had some outpatient labs performed to try to determine why she has been intermittently short of breath for approximately 7 or 8 months.  She has been to see a cardiologist who then sent her to a pulmonologist.  The pulmonologist ordered a D-dimer and when it was elevated he recommended that she come to the hospital for a CT scan to rule out pulmonary embolism.  The patient also checked with Dr. [name] who is doing her procedure in the morning and he encouraged her to do so.  She is having no acute shortness of breath and the symptoms have been present for 7 to 8 months.  No chest pain.  She denies nausea, vomiting, fever/chills, abdominal pain, and dysuria.  The symptoms have been gradual in onset and nothing in particular makes them better, exertion makes her shortness of breath worse.         Past Medical History:  Diagnosis Date  . Benign tumor of lung   . Cancer [facility])    right breast  . CHF (congestive heart failure) ([place])   . Dyspnea   . GERD (gastroesophageal reflux disease)   . Hyperlipidemia   . Hypertension   . Insomnia   . Osteopenia   . Plantar fasciitis   . PONV (postoperative nausea and vomiting)    vomiting 2 days after lung surgery  . S/P pneumonectomy    right lower lobe  . Urinary incontinence     Patient Active Problem List   Diagnosis  Date Noted  . Malignant neoplasm of right breast in female, estrogen receptor positive ([place]) 07/24/2019  . IFG (impaired fasting glucose) 05/18/2018  . H/O adenomatous polyp of colon 01/19/2015  . Insomnia 01/19/2015  . GERD (gastroesophageal reflux disease) 01/19/2015  . Benign tumor of lung 01/19/2015  . Hyperlipidemia 01/19/2015  . Obesity 01/19/2015  . Orthostatic dizziness 09/22/2014  . Osteopenia 03/08/2014  . Essential hypertension 07/18/2013  . Obstructive apnea 07/18/2013  . Left ventricular dysfunction 09/28/2011    Past Surgical History:  Procedure Laterality Date  . BREAST BIOPSY Right 07/17/2019   Affirm bx-"Ribbon" clip-path pending  . bronchoscopy    . CATARACT EXTRACTION, BILATERAL     Left- 11/21/15, right- 12/23/15  . CHOLECYSTECTOMY    . CYSTECTOMY  11/03/09   cheek  . dilation and curretage    . EYE SURGERY Bilateral    cataract  . FOOT SURGERY Bilateral 2010  . LOBECTOMY    . LUNG SURGERY Right   . UPPER GI ENDOSCOPY  2005    Prior to Admission medications   Medication Sig Start Date End Date Taking? Authorizing Provider  acetaminophen (TYLENOL) 500 MG tablet Take 1,000 mg by mouth every 6 (six) hours as needed for moderate pain.     [provider]  amLODipine (NORVASC) 5 MG tablet Take 1 tablet (5 mg total) by mouth daily.  11/17/18   Marnee Guarneri T, NP  aspirin 81 MG tablet Take 81 mg by mouth daily.    [provider]  Biotin 5000 MCG TABS Take 5,000 mcg by mouth 2 (two) times daily.    [provider]  Calcium Carb-Cholecalciferol (CALCIUM-VITAMIN D) 600-400 MG-UNIT TABS Take 1 tablet by mouth 2 (two) times daily.    [provider]  cholecalciferol (VITAMIN D3) 25 MCG (1000 UNIT) tablet Take 1,000 Units by mouth in the morning and at bedtime.    [provider]  Cyanocobalamin (B-12) 2500 MCG TABS Take 2,500 mcg by mouth daily.    [provider]  ibuprofen (IBU-200) 200 MG tablet Take 400 mg by  mouth every 6 (six) hours as needed for moderate pain.     [provider]  metoprolol succinate (TOPROL-XL) 25 MG 24 hr tablet Take 1 tablet (25 mg total) by mouth daily. 11/17/18   [name], Henrine Screws T, NP  pantoprazole (PROTONIX) 40 MG tablet Take 1 tablet (40 mg total) by mouth 2 (two) times daily. 11/17/18   [name], Henrine Screws T, NP  Red Yeast Rice Extract (RED YEAST RICE PO) Take 1 tablet by mouth in the morning and at bedtime.    [provider]  [facility] Wort 300 MG CAPS Take 300 mg by mouth 2 (two) times daily.    [provider]  valsartan (DIOVAN) 160 MG tablet Take 1 tablet (160 mg total) by mouth daily. 11/17/18   Marnee Guarneri T, NP    Allergies Contrast media [iodinated diagnostic agents], Ace inhibitors, Hctz [hydrochlorothiazide], and Keflex [cephalexin]  Family History  Problem Relation Age of Onset  . COPD Mother   . Hypertension Mother   . Emphysema Mother   . Osteoporosis Mother   . Heart disease Father   . Hyperlipidemia Sister   . Hyperlipidemia Brother   . Heart disease Maternal Grandmother   . Heart disease Maternal Grandfather   . Heart disease Paternal Grandmother   . Stroke Paternal Grandmother   . Heart disease Paternal Grandfather   . Stroke Paternal Grandfather   . Cancer Maternal Aunt        breast and lung  . Osteoporosis Maternal Aunt   . Aortic aneurysm Maternal Aunt   . Breast cancer Maternal Aunt 76    Social History Social History   Tobacco Use  . Smoking status: Former Smoker    Quit date: 07/09/1979    Years since quitting: 40.1  . Smokeless tobacco: Never Used  Substance Use Topics  . Alcohol use: No  . Drug use: No    Review of Systems Constitutional: No fever/chills Eyes: No visual changes. ENT: No sore throat. Cardiovascular: Denies chest pain. Respiratory: Chronic shortness of breath. Gastrointestinal: No abdominal pain.  No nausea, no vomiting.  No diarrhea.  No constipation. Genitourinary: Negative  for dysuria. Musculoskeletal: Negative for neck pain.  Negative for back pain. Integumentary: Negative for rash. Neurological: Negative for headaches, focal weakness or numbness.   ____________________________________________   PHYSICAL EXAM:  VITAL SIGNS: ED Triage Vitals  Enc Vitals Group     BP 08/12/19 2024 (!) 181/74     Pulse Rate 08/12/19 2024 77     Resp 08/12/19 2024 20     Temp 08/12/19 2024 98.8 F (37.1 C)     Temp Source 08/12/19 2024 Oral     SpO2 08/12/19 2024 96 %     Weight 08/12/19 2025 104.8 kg (231 lb)  Height 08/12/19 2025 1.702 m (5\' 7" )     Head Circumference --      Peak Flow --      Pain Score 08/12/19 2024 0     Pain Loc --      Pain Edu? --      Excl. in [place]? --    Constitutional: Alert and oriented.  Eyes: Conjunctivae are normal.  Head: Atraumatic. Nose: No congestion/rhinnorhea. Mouth/Throat: Patient is wearing a mask. Neck: No stridor.  No meningeal signs.   Cardiovascular: Normal rate, regular rhythm. Good peripheral circulation. Grossly normal heart sounds. Respiratory: Normal respiratory effort.  No retractions. Gastrointestinal: Soft and nontender. No distention.  Musculoskeletal: No lower extremity tenderness nor edema. No gross deformities of extremities. Neurologic:  Normal speech and language. No gross focal neurologic deficits are appreciated.  Skin:  Skin is warm, dry and intact. Psychiatric: Mood and affect are normal. Speech and behavior are normal.  ____________________________________________   LABS (all labs ordered are listed, but only abnormal results are displayed)  Labs Reviewed  COMPREHENSIVE METABOLIC PANEL - Abnormal; Notable for the following components:      Result Value   Glucose, Bld 119 (*)    All other components within normal limits  CBC  TROPONIN I (HIGH SENSITIVITY)   ____________________________________________  EKG  ED ECG REPORT I, Hinda Kehr, the attending physician, personally viewed  and interpreted this ECG.  Date: 08/12/2019 EKG Time: 20: 23 Rate: 76 Rhythm: normal sinus rhythm QRS Axis: normal Intervals: normal ST/T Wave abnormalities: normal Narrative Interpretation: no evidence of acute ischemia  ____________________________________________  RADIOLOGY I, Hinda Kehr, personally viewed and evaluated these images (plain radiographs) as part of my medical decision making, as well as reviewing the written report by the radiologist.  ED MD interpretation:  No acute/emergent findings including no PE  Official radiology report(s): CT Angio Chest PE W/Cm &/Or Wo Cm  Result Date: 08/13/2019 CLINICAL DATA:  Abnormal D-dimer.  Planned lumpectomy moral EXAM: CT ANGIOGRAPHY CHEST WITH CONTRAST TECHNIQUE: Multidetector CT imaging of the chest was performed using the standard protocol during bolus administration of intravenous contrast. Multiplanar CT image reconstructions and MIPs were obtained to evaluate the vascular anatomy. CONTRAST:  34mL OMNIPAQUE IOHEXOL 350 MG/ML SOLN COMPARISON:  None available FINDINGS: Cardiovascular: Satisfactory opacification of the pulmonary arteries to the segmental level. No evidence of pulmonary embolism. Normal heart size. No pericardial effusion. Mediastinum/Nodes: Negative for adenopathy or mass. No axillary adenopathy. Lungs/Pleura: Right lower lobectomy airway distortion and narrowing the right middle lobe. There is no edema, consolidation, effusion, or pneumothorax. Upper Abdomen: Cholecystectomy. Musculoskeletal: Negative Review of the MIP images confirms the above findings. IMPRESSION: 1. Negative for pulmonary embolism or other acute finding. 2. Right lower lobectomy with distorted and very narrowed right middle lobe bronchus. Electronically Signed   By: Monte Fantasia M.D.   On: 08/13/2019 06:05    ____________________________________________   PROCEDURES   Procedure(s) performed (including Critical  Care):  Procedures   ____________________________________________   INITIAL IMPRESSION / MDM / [place] / ED COURSE  As part of my medical decision making, I reviewed the following data within the [place] notes reviewed and incorporated, Labs reviewed , Old chart reviewed and Notes from prior ED visits   Differential diagnosis includes, but is not limited to, nonspecific D-dimer elevation in the setting of a geriatric patient with breast cancer, pulmonary embolism.  Given that the patient was sent over by a pulmonologist and a surgeon in  preparation for her surgical procedure in the morning in the setting of chronic dyspnea and an elevated D-dimer, I will proceed with the CTA chest but I have a low suspicion for pulmonary embolism under the circumstances.  Her vital signs are stable, she is not tachycardic, she is not hypoxemic, and she has alternative reasons for the dimer to be elevated.  Unfortunately she has reported contrast dye allergy so I am doing the emergency procedure preparation with hydrocortisone 200 mg IV and Benadryl 50 mg prior to the procedure as per protocol.  She understands and agrees with the plan.       Clinical Course as of Aug 12 705  Thu Aug 13, 2019  [id number] Patient is stable and asymptomatic at this time.  She is scheduled to get her dose of Benadryl at 4:15 AM and the CTA chest to rule out pulmonary embolism at 5:15 AM.   [CF]  0600 Negative CTA chest.  Patient asymptomatic.  She will be discharged to follow up with her outpatient doctors and go to her procedure this morning.   [CF]    Clinical Course User Index [CF] Hinda Kehr, MD     ____________________________________________  FINAL CLINICAL IMPRESSION(S) / ED DIAGNOSES  Final diagnoses:  Elevated d-dimer     MEDICATIONS GIVEN DURING THIS VISIT:  Medications  diphenhydrAMINE (BENADRYL) capsule 50 mg ( Oral See Alternative 08/13/19 0421)    Or   diphenhydrAMINE (BENADRYL) injection 50 mg (50 mg Intravenous Not Given 08/13/19 0421)  hydrocortisone sodium succinate (SOLU-CORTEF) injection 200 mg (200 mg Intravenous Given 08/13/19 0140)  iohexol (OMNIPAQUE) 350 MG/ML injection 75 mL (75 mLs Intravenous Contrast Given 08/13/19 0537)     ED Discharge Orders    None      *Please note:  Melissa Merritt was evaluated in Emergency Department on 08/13/2019 for the symptoms described in the history of present illness. She was evaluated in the context of the global COVID-19 pandemic, which necessitated consideration that the patient might be at risk for infection with the SARS-CoV-2 virus that causes COVID-19. Institutional protocols and algorithms that pertain to the evaluation of patients at risk for COVID-19 are in a state of rapid change based on information released by regulatory bodies including the CDC and federal and state organizations. These policies and algorithms were followed during the patient's care in the ED.  Some ED evaluations and interventions may be delayed as a result of limited staffing during the pandemic.*  Note:  This document was prepared using Dragon voice recognition software and may include unintentional dictation errors.   Hinda Kehr, MD 08/13/19 [phone]

## 2019-08-13 NOTE — Anesthesia Preprocedure Evaluation (Addendum)
Anesthesia Evaluation  Patient identified by MRN, date of birth, ID band Patient awake    Reviewed: Allergy & Precautions, H&P , NPO status , Patient's Chart, lab work & pertinent test results  History of Anesthesia Complications (+) PONV and history of anesthetic complications (severe PONV after lobectomy)  Airway Mallampati: III  TM Distance: >3 FB Neck ROM: full    Dental  (+) Teeth Intact   Pulmonary shortness of breath (chronic for months, followed by Pulmonology) and with exertion, sleep apnea , former smoker,  S/p RLL lobectomy for lung CA Chronic SOB, noted to have elevated D-dimer on labs drawn on 08/10/19, sent to ED last night after the lab resulted. CTA in ED was negative for PE.  Pt states SOB is at baseline for her  No increased WOB  breath sounds clear to auscultation       Cardiovascular hypertension, +CHF   Rhythm:regular Rate:Normal  Echo [id number]: MILD LV SYSTOLIC DYSFUNCTION WITH AN ESTIMATED EF = 45-50 % NORMAL RIGHT VENTRICULAR SYSTOLIC FUNCTION TRACE TRICUSPID AND MITRAL VALVE INSUFFICIENCY NO VALVULAR STENOSIS MILD LV ENLARGEMENT   Neuro/Psych negative neurological ROS  negative psych ROS   GI/Hepatic Neg liver ROS, GERD  Controlled,  Endo/Other  negative endocrine ROS  Renal/GU      Musculoskeletal   Abdominal   Peds  Hematology negative hematology ROS (+)   Anesthesia Other Findings Past Medical History: No date: Benign tumor of lung No date: Cancer ([place])     Comment:  right breast No date: CHF (congestive heart failure) ([facility]) No date: Dyspnea No date: GERD (gastroesophageal reflux disease) No date: Hyperlipidemia No date: Hypertension No date: Insomnia No date: Osteopenia No date: Plantar fasciitis No date: PONV (postoperative nausea and vomiting)     Comment:  vomiting 2 days after lung surgery No date: S/P pneumonectomy     Comment:  right lower lobe No date: Urinary  incontinence  Past Surgical History: 07/17/2019: BREAST BIOPSY; Right     Comment:  Affirm bx-"Ribbon" clip-path pending No date: bronchoscopy No date: CATARACT EXTRACTION, BILATERAL     Comment:  Left- 11/21/15, right- 12/23/15 No date: CHOLECYSTECTOMY 11/03/09: CYSTECTOMY     Comment:  cheek No date: dilation and curretage No date: EYE SURGERY; Bilateral     Comment:  cataract 2010: FOOT SURGERY; Bilateral No date: LOBECTOMY No date: LUNG SURGERY; Right 2005: UPPER GI ENDOSCOPY     Reproductive/Obstetrics negative OB ROS                          Anesthesia Physical Anesthesia Plan  ASA: III  Anesthesia Plan: General ETT   Post-op Pain Management:    Induction:   PONV Risk Score and Plan: Dexamethasone, Ondansetron, Treatment may vary due to age or medical condition, TIVA and Propofol infusion  Airway Management Planned:   Additional Equipment:   Intra-op Plan:   Post-operative Plan:   Informed Consent: I have reviewed the patients History and Physical, chart, labs and discussed the procedure including the risks, benefits and alternatives for the proposed anesthesia with the patient or authorized representative who has indicated his/her understanding and acceptance.     Dental Advisory Given  Plan Discussed with: Anesthesiologist  Anesthesia Plan Comments:        Anesthesia Quick Evaluation

## 2019-08-13 NOTE — Transfer of Care (Signed)
Immediate Anesthesia Transfer of Care Note  Patient: Melissa Merritt  Procedure(s) Performed: PART [place] SENTINEL NODE BIOPSY (Right )  Patient Location: PACU  Anesthesia Type:General  Level of Consciousness: drowsy  Airway & Oxygen Therapy: Patient Spontanous Breathing and Patient connected to face mask oxygen  Post-op Assessment: Report given to RN and Post -op Vital signs reviewed and stable  Post vital signs: Reviewed and stable  Last Vitals:  Vitals Value Taken Time  BP 114/68 08/13/19 1238  Temp    Pulse 67 08/13/19 1240  Resp 28 08/13/19 1240  SpO2 98 % 08/13/19 1240  Vitals shown include unvalidated device data.  Last Pain:  Vitals:   08/13/19 0957  TempSrc: Temporal  PainSc: 0-No pain         Complications: No apparent anesthesia complications

## 2019-08-13 NOTE — Discharge Instructions (Signed)
Removal, Care After This sheet gives you information about how to care for yourself after your procedure. Your health care provider may also give you more specific instructions. If you have problems or questions, contact your health care provider. What can I expect after the procedure? After the procedure, it is common to have:  Soreness.  Bruising.  Itching. Follow these instructions at home: site care Follow instructions from your health care provider about how to take care of your site. Make sure you:  Wash your hands with soap and water before and after you change your bandage (dressing). If soap and water are not available, use hand sanitizer.  Leave stitches (sutures), skin glue, or adhesive strips in place. These skin closures may need to stay in place for 2 weeks or longer. If adhesive strip edges start to loosen and curl up, you may trim the loose edges. Do not remove adhesive strips completely unless your health care provider tells you to do that.  If the area bleeds or bruises, apply gentle pressure for 10 minutes.  OK TO SHOWER IN 24HRS  Check your site every day for signs of infection. Check for:  Redness, swelling, or pain.  Fluid or blood.  Warmth.  Pus or a bad smell.  General instructions  Rest and then return to your normal activities as told by your health care provider. .  tylenol and advil as needed for discomfort.  Please alternate between the two every four hours as needed for pain.   .  Use narcotics, if prescribed, only when tylenol and motrin is not enough to control pain. .  325-650mg  every 8hrs to max of 3000mg /24hrs (including the 325mg  in every norco dose) for the tylenol.   .  Advil up to 800mg  per dose every 8hrs as needed for pain.   Marland Kitchen RESUME ASPIRIN 72HRS AFTER SURGERY  Keep all follow-up visits as told by your health care provider. This is important. Contact a health care provider if:  You have redness, swelling, or pain around your  site.  You have fluid or blood coming from your site.  Your site feels warm to the touch.  You have pus or a bad smell coming from your site.  You have a fever.  Your sutures, skin glue, or adhesive strips loosen or come off sooner than expected. Get help right away if:  You have bleeding that does not stop with pressure or a dressing. Summary  After the procedure, it is common to have some soreness, bruising, and itching at the site.  Follow instructions from your health care provider about how to take care of your site.  Check your site every day for signs of infection.  Contact a health care provider if you have redness, swelling, or pain around your site, or your site feels warm to the touch.  Keep all follow-up visits as told by your health care provider. This is important. This information is not intended to replace advice given to you by your health care provider. Make sure you discuss any questions you have with your health care provider. Document Released: 04/22/2015 Document Revised: 09/23/2017 Document Reviewed: 09/23/2017 Elsevier Interactive Patient Education  2019 [employer]   1) The drugs that you were given will stay in your system until tomorrow so for the next 24 hours you should not:  A) Drive an automobile B) Make any legal decisions C) Drink any alcoholic beverage   2) You may  resume regular meals tomorrow.  Today it is better to start with liquids and gradually work up to solid foods.  You may eat anything you prefer, but it is better to start with liquids, then soup and crackers, and gradually work up to solid foods.   3) Please notify your doctor immediately if you have any unusual bleeding, trouble breathing, redness and pain at the surgery site, drainage, fever, or pain not relieved by medication.    4) Additional Instructions:    Please contact your physician with any problems or Same Day  Surgery at [phone], Monday through Friday 6 am to 4 pm, or [place] at [facility] number at [phone].

## 2019-08-13 NOTE — ED Notes (Signed)
Pt returned from CT. Assisted to the restroom. Gait steady. No distress noted. Pt smiling and talkative at this time.

## 2019-08-13 NOTE — ED Notes (Signed)
Dr. Karma Greaser at the bedside for pt evaluation

## 2019-08-13 NOTE — Op Note (Addendum)
Preoperative diagnosis: Right breast carcinoma.  Postoperative diagnosis: Same.   Procedure: RF-localized right breast partial mastectomy.                      Right axillary Sentinel Lymph node biopsy  Anesthesia: GETA  Surgeon: Dr. Benjamine Sprague  Wound Classification: Clean  Indications: Patient is a 69 y.o. female with a nonpalpable right breast mass noted on mammography with core biopsy demonstrating above requires RF localizer placement, partial mastectomy for treatment with sentinel lymph node biopsy.   Specimen: Right breast mass, Sentinel Lymph nodes x 1, posterior margin  Complications: None  Estimated Blood Loss: 20 mL  Findings: 1. Specimen mammography shows marker and RF localizer on specimen 2. Pathology call refers gross examination of margins was clear, but no previous biopsy clip noted. 3. No other palpable mass or lymph node identified.   Description of procedure: RF localizer placed by radiology few days prior to surgery date.  In the nuclear medicine suite, the subareolar region was injected with Tc-99 sulfur colloid the morning of procedure. Localization studies were reviewed. The patient was taken to the operating room and placed supine on the operating table, and after general anesthesia the right breast and axilla were prepped and draped in the usual sterile fashion. A time-out was completed verifying correct patient, procedure, site, positioning, and implant(s) and/or special equipment prior to beginning this procedure.  By identifying the RF localizer, the probable trajectory and location of the mass was visualized. A skin incision was planned in such a way as to minimize the amount of dissection to reach the mass.  The skin incision was made after infusion of local. Flaps were raised and  Sharp and blunt dissection was then taken down to the mass, taking care to include the entire RF localizer and a margin of grossly normal tissue. The specimen was removed. The  specimen was oriented with paint and sent to radiology with the localization studies. Confirmation was received that RF tag within the specimen but no sign of the actual biopsy clip.  Of note, biopsy clip was of the initial area of biopsy and the RF tag was placed more closely to the actual biopsy site.  Pathology called back and confirm no evidence of a biopsy clip and the margins were grossly negative, with RF tag closest to the posterior margin roughly 2 to 3 mm.  Reviewing previous studies, biopsy clip was noted to be posterior to the RF tag, therefore a generous amount of the posterior margin was excised for completeness sake.  Unfortunately, the biopsy clip still was not found within the specimen after sending it to pathology due to the fact the RF tag was closer to the actual biopsy site, and the margins were grossly negative on the initial specimen, decision was made to forego any additional excision at this time until the final pathology report was submitted.    A hand-held gamma probe was used to identify the location of the hottest spot in the axilla. An incision was made around the caudal axillary hairline. Sharp and blunt Dissection was carried down to subdermal facias. The probe was placed within wound and again, the point of maximal count was found. Dissection continue until nodule was identified. The probe was placed in contact with the node and 250 counts were recorded. The node was excised in its entirety. Ex vivo, the node measured 250 counts when placed on the probe. The bed of the node measured less than 20  counts. No additional hot spots were identified. No clinically abnormal nodes were palpated. Both wounds irrigated, hemostasis was achieved and the wound closed in layers with  interrupted sutures of 3-0 Vicryl in deep dermal layer and a running subcuticular suture of Monocryl 4-0, then dressed with dermabond. The patient tolerated the procedure well and was taken to the postanesthesia  care unit in stable condition. Sponge and instrument count correct at end of procedure.

## 2019-08-14 NOTE — Anesthesia Postprocedure Evaluation (Signed)
Anesthesia Post Note  Patient: Melissa Merritt  Procedure(s) Performed: PART MASTECTOMY,RADIO FREQUENCY LOCALIZER,AXILLARY SENTINEL NODE BIOPSY (Right )  Patient location during evaluation: PACU Anesthesia Type: General Level of consciousness: awake and alert Pain management: pain level controlled Vital Signs Assessment: post-procedure vital signs reviewed and stable Respiratory status: spontaneous breathing, nonlabored ventilation and respiratory function stable Cardiovascular status: blood pressure returned to baseline and stable Postop Assessment: no apparent nausea or vomiting Anesthetic complications: no     Last Vitals:  Vitals:   08/13/19 1318 08/13/19 1325  BP: 136/90 133/69  Pulse: 72 75  Resp: 15 16  Temp: (!) 36.2 C (!) 36.4 C  SpO2: 95%     Last Pain:  Vitals:   08/14/19 0842  TempSrc:   PainSc: 0-No pain                 Tera Mater

## 2019-08-18 LAB — SURGICAL PATHOLOGY

## 2019-08-31 NOTE — Progress Notes (Signed)
[facility]  [address] [place], [place] [address] Phone: [phone]  Fax: [phone]   Clinic Day:  09/01/2019  Referring physician: Venita Lick, NP  Chief Complaint: Melissa Merritt is a 69 y.o. female with right breast cancer who is seen for review of pathology and discussion regarding direction of therapy.   HPI:  The patient was last seen in the medical oncology clinic on 07/24/2019 for an initial consultation. At that time, she felt good. Exam revealed post biopsy changes. Labs were normal. CA 27.29 was 19.0.  I planned for a bone density study and oncotype DX. We discussed plan for lumpectomy and SLN biopsy followed by radiation and adjuvant endocrine therapy x 5 years.   Bone density on 08/04/2019 revealed osteopenia with a T score of -1.5 in the AP spine L2-L4.  She underwent partial right mastectomy and right axillary sentinel lymph node biopsy on 08/13/2019 with Dr. [name].   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  There was atypical lobular hyperplasia, with ductal involvement. There was background mammary parenchyma with fibrocystic and apocrine changes, and focal sclerosing adenosis. There was calcification associated with benign and neoplastic mammary elements. Additional posterior margin revealed no evidence of residual invasive carcinoma or DCIS.  Closest margin for invasive carcinoma was > 10 mm and 2 mm for DCIS.  One lymph node was negative for malignancy.  Tumor was ER + (91-100%), PR+ (91-100%), and Her2/neu -.  Pathologic stage was pT1a pN0 (sn).  During the interim, she was doing alright.  She has some right breast tenderness and soreness (3/10). Patient is having some breathing issues which she is seeing a pulmonologist; she started on a daily inhaler. She is fighting a cold.  She is on amoxicillins/p a root canal. She is starting to feel better.  She has some  congestion.  She is in the process of getting a CPAP machine.   She agreed to be seen in radiation oncology.  She is considering tamoxifen for hormonal therapy.   She received her last Moderna COVID-19 vaccine at the end or 06/2019.    Past Medical History:  Diagnosis Date  . Benign tumor of lung   . Cancer [facility])    right breast  . CHF (congestive heart failure) ([place])   . Dyspnea   . GERD (gastroesophageal reflux disease)   . Hyperlipidemia   . Hypertension   . Insomnia   . Osteopenia   . Plantar fasciitis   . PONV (postoperative nausea and vomiting)    vomiting 2 days after lung surgery  . S/P pneumonectomy    right lower lobe  . Urinary incontinence     Past Surgical History:  Procedure Laterality Date  . BREAST BIOPSY Right 07/17/2019   Affirm bx-"Ribbon" clip-path pending  . bronchoscopy    . CATARACT EXTRACTION, BILATERAL     Left- 11/21/15, right- 12/23/15  . CHOLECYSTECTOMY    . CYSTECTOMY  11/03/09   cheek  . dilation and curretage    . EYE SURGERY Bilateral    cataract  . FOOT SURGERY Bilateral 2010  . LOBECTOMY    . LUNG SURGERY Right   . PART MASTECTOMY,RADIO FREQUENCY LOCALIZER,AXILLARY SENTINEL NODE BIOPSY Right 08/13/2019   Procedure: PART MASTECTOMY,RADIO FREQUENCY LOCALIZER,AXILLARY SENTINEL NODE BIOPSY;  Surgeon: Benjamine Sprague, DO;  Location: [facility] ORS;  Service: General;  Laterality: Right;  . UPPER GI ENDOSCOPY  2005    Family History  Problem Relation Age of Onset  . COPD Mother   . Hypertension Mother   . Emphysema Mother   . Osteoporosis Mother   . Heart disease Father   . Hyperlipidemia Sister   . Hyperlipidemia Brother   . Heart disease Maternal Grandmother   . Heart disease Maternal Grandfather   . Heart disease Paternal Grandmother   . Stroke Paternal Grandmother   . Heart disease Paternal Grandfather   . Stroke Paternal Grandfather   . Cancer Maternal Aunt        breast and lung  . Osteoporosis Maternal Aunt   . Aortic aneurysm  Maternal Aunt   . Breast cancer Maternal Aunt 76    Social History:  reports that she quit smoking about 40 years ago. She has never used smokeless tobacco. She reports that she does not drink alcohol or use drugs. She has not been exposed to radiations or toxins. She is retired. She used to work at [employer].  She lives in [place].  The patient is alone today.   Allergies:  Allergies  Allergen Reactions  . Contrast Media [Iodinated Diagnostic Agents] Swelling    Eye swelling shut  . Other Swelling    Eye swelling shut IV contrast media   . Ace Inhibitors Other (See Comments)    "Makes me feel strange."  . Hctz [Hydrochlorothiazide] Other (See Comments)    arms ache  . Keflex [Cephalexin] Other (See Comments)    thrush    Current Medications: Current Outpatient Medications  Medication Sig Dispense Refill  . acetaminophen (TYLENOL) 325 MG tablet Take 2 tablets (650 mg total) by mouth every 8 (eight) hours as needed for mild pain. 40 tablet 0  . amLODipine (NORVASC) 5 MG tablet Take 1 tablet (5 mg total) by mouth daily. 90 tablet 3  . amoxicillin (AMOXIL) 500 MG capsule Take 500 mg by mouth 3 (three) times daily.    . Biotin 5000 MCG TABS Take 5,000 mcg by mouth 2 (two) times daily.    . Calcium Carb-Cholecalciferol (CALCIUM-VITAMIN D) 600-400 MG-UNIT TABS Take 1 tablet by mouth 2 (two) times daily.    . cholecalciferol (VITAMIN D3) 25 MCG (1000 UNIT) tablet Take 1,000 Units by mouth in the morning and at bedtime.    . Cyanocobalamin (B-12) 2500 MCG TABS Take 2,500 mcg by mouth daily.    Marland Kitchen HYDROcodone-acetaminophen (NORCO) 5-325 MG tablet Take 1 tablet by mouth every 6 (six) hours as needed for up to 6 doses for moderate pain. 6 tablet 0  . ibuprofen (IBU-200) 200 MG tablet Take 400 mg by mouth every 6 (six) hours as needed for moderate pain.     . metoprolol succinate (TOPROL-XL) 25 MG 24 hr tablet Take 1 tablet (25 mg total) by mouth daily. 90 tablet 3  . pantoprazole  (PROTONIX) 40 MG tablet Take 1 tablet (40 mg total) by mouth 2 (two) times daily. 180 tablet 3  . Red Yeast Rice Extract (RED YEAST RICE PO) Take 1 tablet by mouth in the morning and at bedtime.    . St Johns Wort 300 MG CAPS Take 300 mg by mouth 2 (two) times daily.    Marland Kitchen umeclidinium-vilanterol (ANORO ELLIPTA) 62.5-25 MCG/INH AEPB Inhale into the lungs.    . valsartan (DIOVAN) 160 MG tablet Take 1 tablet (160 mg total) by mouth daily. 90 tablet 3  . aspirin 81 MG tablet Take 81 mg by mouth daily.     No current facility-administered medications for this  visit.    Review of Systems  Constitutional: Positive for weight loss (2 lbs). Negative for chills, diaphoresis, fever and malaise/fatigue.       Doing alright.  HENT: Positive for congestion. Negative for hearing loss, nosebleeds, sinus pain, sore throat and tinnitus.   Eyes: Negative for blurred vision and double vision.  Respiratory: Positive for shortness of breath (on exertion). Negative for cough.        Sleep apnea.  Cardiovascular: Positive for chest pain (right breast tenderness and soreness (3/10)) and leg swelling (when she travels). Negative for palpitations and orthopnea (1 pillow).  Gastrointestinal: Negative.  Negative for abdominal pain, blood in stool, constipation, diarrhea, heartburn, melena, nausea and vomiting.  Genitourinary: Negative for dysuria, frequency and urgency.       Incontinence s/p prolapsed uterus.  Musculoskeletal: Negative.  Negative for back pain, falls, joint pain and myalgias.  Skin: Negative.  Negative for itching and rash.  Neurological: Negative.  Negative for dizziness, tingling, tremors, sensory change, speech change, focal weakness, weakness and headaches.  Endo/Heme/Allergies: Negative.  Does not bruise/bleed easily.  Psychiatric/Behavioral: Negative.  Negative for depression and memory loss. The patient is not nervous/anxious and does not have insomnia.   All other systems reviewed and are  negative.  Performance status (ECOG):  1  Vitals: Blood pressure (!) 143/61, pulse 72, temperature (!) 97.1 F (36.2 C), temperature source Tympanic, resp. rate 16, weight 227 lb 13.5 oz (103.4 kg), SpO2 98 %.   Physical Exam  Nursing note and vitals reviewed. Constitutional: She is oriented to person, place, and time. She appears well-developed and well-nourished. No distress.  HENT:  Head: Normocephalic and atraumatic.  Mouth/Throat: Oropharynx is clear and moist.  Short styled brown and blonde hair.  Mask.   Eyes: Pupils are equal, round, and reactive to light. Conjunctivae and EOM are normal. Right eye exhibits no discharge. Left eye exhibits no discharge. No scleral icterus.  Glasses propped up on head.  Neck: No JVD present.  Cardiovascular: Normal rate, regular rhythm, normal heart sounds and intact distal pulses. Exam reveals no gallop and no friction rub.  No murmur heard. Respiratory: Effort normal and breath sounds normal. No respiratory distress. She has no wheezes. She has no rales. She exhibits no tenderness. Right breast exhibits no inverted nipple, no mass, no nipple discharge, no skin change and no tenderness. Left breast exhibits no inverted nipple, no mass, no nipple discharge, no skin change and no tenderness.  Well healing right axillary and lumpectomy incision sites with mild post-operative changes.   Left breast with fibrocystic changes in the upper inner quadrant.  GI: Soft. Bowel sounds are normal. She exhibits no distension and no mass. There is no abdominal tenderness. There is no rebound and no guarding.  Musculoskeletal:        General: No tenderness or edema. Normal range of motion.     Cervical back: Normal range of motion and neck supple.  Lymphadenopathy:       Head (right side): No preauricular, no posterior auricular and no occipital adenopathy present.       Head (left side): No preauricular, no posterior auricular and no occipital adenopathy present.      She has no cervical adenopathy.    She has no axillary adenopathy.       Right: No inguinal and no supraclavicular adenopathy present.       Left: No inguinal and no supraclavicular adenopathy present.  Neurological: She is alert and oriented to person, place,  and time.  Skin: Skin is warm and dry. No rash noted. She is not diaphoretic. No erythema. No pallor.  Psychiatric: She has a normal mood and affect. Her behavior is normal. Judgment and thought content normal.    No visits with results within 3 Day(s) from this visit.  Latest known visit with results is:  Hospital Outpatient Visit on 07/17/2019  Component Date Value Ref Range Status  . SURGICAL PATHOLOGY 07/17/2019    Final-Edited                   Value:SURGICAL PATHOLOGY THIS IS AN ADDENDUM REPORT CASE: [id number] PATIENT: Melissa Merritt Surgical Pathology Report Addendum  Reason for Addendum #1:  Breast Biomarker Results  Specimen Submitted: A. Breast, right, upper outer quadrant; biopsy  Clinical History: Small area of distortion right upper-outer breast seen at mammography with no [name] correlate.  Prob. CSL or [employer]. Post biopsy mammograms show the ribbon-shaped biopsy marker clip 3 cm inferior and posterior to the biopsied distortion in the right breast in the lateral projection and at the location of the biopsied distortion in the craniocaudal projection.   DIAGNOSIS: A. BREAST, RIGHT, UPPER OUTER QUADRANT; STEREOTACTIC-GUIDED CORE BIOPSY: - INVASIVE MAMMARY CARCINOMA, NO SPECIAL TYPE.  Size of invasive carcinoma: 4 mm in this sample Histologic grade of invasive carcinoma: Grade 1                      Glandular/tubular differentiation score: 2                      Nucle                         ar pleomorphism score: 1                      Mitotic rate score: 1                      Total score: 4 Ductal carcinoma in situ: Present, intermediate nuclear grade with central necrosis Lymphovascular invasion:  Not identified Additional finding: Atypical lobular hyperplasia  ER/PR/HER2: Immunohistochemistry will be performed on block A2, with reflex to [place] for HER2 2+. The results will be reported in an addendum.  Comment: The definitive grade will be assigned on the excisional specimen.  GROSS DESCRIPTION: A. Labeled: Right breast, upper outer quadrant Received: in a formalin-filled [place] collection device Accompanying specimen radiograph: No Time/Date in fixative: Collected at 9:03 AM and placed into formalin at 9:06 AM on 07/17/2019. Cold ischemic time: Less than 5 minutes Total fixation time: 12 hours Core pieces: Multiple Measurement: Aggregate, 7.5 x 2.0 x 0.3 cm Description / comments: Received are needle core biopsy fragments of tan-yellow, fibrofatt                         y tissue Inked: Blue Entirely submitted in cassette(s): 1-6   Final Diagnosis performed by Bryan Lemma, MD.   Electronically signed 07/20/2019 1:50:46PM The electronic signature indicates that the named Attending Pathologist has evaluated the specimen Technical component performed at [facility], [address] [place], [place] [address] Lab: [phone] Dir: Rush Farmer, MD, MMM  Professional component performed at [facility], [facility], [place], [place], [place] [address] Lab: [phone] Dir: Dellia Nims. Reuel Derby, MD  ADDENDUM: BREAST BIOMARKER TESTS Estrogen Receptor (ER) Status: POSITIVE  Percentage of cells with nuclear positivity: 91-100%                      Average intensity of staining: Strong  Progesterone Receptor (PgR) Status: POSITIVE                      Percentage of cells with nuclear positivity: 91-100%                      Average intensity of staining: Strong  HER2 (by immunohistochemistry): NEGATIVE (                         Score 0)  Cold Ischemia and Fixation Times: Meet requirements specified in latest version of the ASCO/CAP  guidelines Testing Performed on Block Number(s): A2  METHODS Fixative: Formalin Estrogen Receptor:  FDA cleared ([place]) Primary Antibody:  SP1 Progesterone Receptor: FDA cleared ([place]) Primary Antibody: 1E2 HER2 (by IHC): FDA cleared ([place]) Primary Antibody: 4B5 ([facility]) Immunohistochemistry controls worked appropriately. Slides were prepared by [facility], [place], [place], and interpreted by Betsy Pries, MD.  This test was developed and its performance characteristics determined by [facility]. It has not been cleared or approved by the [name] Food and Drug Administration. The FDA does not require this test to go through premarket FDA review. This test is used for clinical purposes. It should not be regarded as investigational or for research. This laboratory is certified under the Clinical Laboratory Improvement Amendments (CLIA) as qualified to pe                         rform high complexity clinical laboratory testing.  Addendum #1 performed by Betsy Pries, MD.   Electronically signed 07/23/2019 4:44:41PM The electronic signature indicates that the named Attending Pathologist has evaluated the specimen Technical component performed at [facility], [address] [place], [place] [address] Lab: [phone] Dir: Rush Farmer, MD, MMM  Professional component performed at [facility], [facility], [place], [place],  [address] Lab: [phone] Dir: Dellia Nims. Rubinas, MD    Assessment:  Melissa Merritt is a 69 y.o. female with stage IA right breast cancer s/p partial mastectomy and right axillary sentinel lymph node biopsy on 08/13/2019.   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  There was atypical lobular hyperplasia, with ductal involvement.  Additional posterior margin revealed no evidence of residual invasive carcinoma or DCIS.  Closest margin for invasive carcinoma was > 10 mm and 2  mm for DCIS.  One lymph node was negative for malignancy.  Tumor was ER + (91-100%), PR+ (91-100%), and Her2/neu -.  Pathologic stage was pT1a pN0 (sn).  She underwent stereotactic biopsy on 07/17/2019.  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    Bilateral diagnostic mammogram on 07/08/2019 revealed persistent architectural distortion involving the UPPER OUTER QUADRANT of the RIGHT breast without sonographic correlate.  There was no pathologic RIGHT axillary lymphadenopathy.  There was no mammographic or sonographic evidence of malignancy involving the LEFT breast.    She is scheduled for partial mastectomy with sentinel lymph node biopsy on 08/13/2019.  Bone density on 08/04/2019 revealed osteopenia with a T score of -1.5 in the AP spine L2-L4.  She has CHF and sleep apnea.  Echo on  07/02/2019 revealed an EF of 45%.  She is scheduled to have a root canal and a crown.  She received her last Moderna COVID-19 vaccine at the end or 06/2019.   Symptomatically, she is doing well.  Incisions are healing well.    Plan: 1.   Stage IA right breast cancer  She is s/p lumpectomy and SLN biopsy.  Pathology reviewed with patient.  Copy of report provided patient.   Tumor is 4 mm and grade 1.   Tumor is ER and PR positive and HER-2/neu negative.   As tumor is < 5 mm, discuss avoiding Oncotype DX testing.  Discuss plan for referral to radiation oncology.  Discuss plan for endocrine therapy (tamoxifen versus aromatase inhibitors) post radiation.   Potential side effects reviewed.   Patient is currently considering tamoxifen in the future  Anticipate follow-up after completion of radiation 2.   Osteopenia  Review interval bone density study..  Discuss calcium, vitamin D and exercise.  Discussed consideration of Prolia.     Potential side effects were reviewed.     Discuss need for dental  clearance. 3.   Radiation oncology consult. 4.   RTC after radiation for MD assessment, labs (CBC with diff, CMP, CA27.29), and initiation of endocrine therapy.  I discussed the assessment and treatment plan with the patient.  The patient was provided an opportunity to ask questions and all were answered.  The patient agreed with the plan and demonstrated an understanding of the instructions.  The patient was advised to call back if the symptoms worsen or if the condition fails to improve as anticipated.  I provided 20 minutes of face-to-face time during this this encounter and > 50% was spent counseling as documented under my assessment and plan. An additional 10 minutes were spent reviewing her chart (Epic and Care Everywhere) including notes, labs, and imaging studies. An additional 10-12 minutes were spent reviewing her chart (Epic and Care Everywhere) including notes, labs, imaging studies, op-report and pathology.    Danijah Noh C. Mike Gip, MD, PhD    09/01/2019, 10:15 AM  I, Selena Batten, am acting as scribe for [place]. Mike Gip, MD, PhD.  I, Dimonique Bourdeau C. Mike Gip, MD, have reviewed the above documentation for accuracy and completeness, and I agree with the above.

## 2019-09-01 ENCOUNTER — Encounter: Payer: Self-pay | Admitting: Hematology and Oncology

## 2019-09-01 ENCOUNTER — Inpatient Hospital Stay: Payer: Medicare Other | Attending: Hematology and Oncology | Admitting: Hematology and Oncology

## 2019-09-01 ENCOUNTER — Other Ambulatory Visit: Payer: Self-pay

## 2019-09-01 VITALS — BP 143/61 | HR 72 | Temp 97.1°F | Resp 16 | Wt 227.8 lb

## 2019-09-01 DIAGNOSIS — Z803 Family history of malignant neoplasm of breast: Secondary | ICD-10-CM | POA: Diagnosis not present

## 2019-09-01 DIAGNOSIS — I509 Heart failure, unspecified: Secondary | ICD-10-CM | POA: Insufficient documentation

## 2019-09-01 DIAGNOSIS — G4730 Sleep apnea, unspecified: Secondary | ICD-10-CM | POA: Insufficient documentation

## 2019-09-01 DIAGNOSIS — M8580 Other specified disorders of bone density and structure, unspecified site: Secondary | ICD-10-CM | POA: Diagnosis not present

## 2019-09-01 DIAGNOSIS — C50411 Malignant neoplasm of upper-outer quadrant of right female breast: Secondary | ICD-10-CM | POA: Insufficient documentation

## 2019-09-01 DIAGNOSIS — Z801 Family history of malignant neoplasm of trachea, bronchus and lung: Secondary | ICD-10-CM | POA: Diagnosis not present

## 2019-09-01 DIAGNOSIS — M8588 Other specified disorders of bone density and structure, other site: Secondary | ICD-10-CM

## 2019-09-01 DIAGNOSIS — Z87891 Personal history of nicotine dependence: Secondary | ICD-10-CM | POA: Diagnosis not present

## 2019-09-01 DIAGNOSIS — C773 Secondary and unspecified malignant neoplasm of axilla and upper limb lymph nodes: Secondary | ICD-10-CM | POA: Diagnosis not present

## 2019-09-01 DIAGNOSIS — Z170 Estrogen receptor positive status [ER+]: Secondary | ICD-10-CM | POA: Diagnosis not present

## 2019-09-01 NOTE — Progress Notes (Signed)
Right breast tenderness and soreness (3/10). Patient is having some breathing issues which she is seeing a pulmonologist for and started on a daily inhaler. She is fighting a a cold which she is taking amoxicillin for, she states she is starting to feel better than she was. She had breast surgery on 08/13/19. She is in the process of getting a CPAP machine

## 2019-09-10 ENCOUNTER — Other Ambulatory Visit: Payer: Self-pay

## 2019-09-10 ENCOUNTER — Encounter: Payer: Self-pay | Admitting: Radiation Oncology

## 2019-09-10 ENCOUNTER — Ambulatory Visit
Admission: RE | Admit: 2019-09-10 | Discharge: 2019-09-10 | Disposition: A | Discharge status: Home / Self Care | Payer: Medicare Other | Source: Ambulatory Visit | Attending: Radiation Oncology | Admitting: Radiation Oncology

## 2019-09-10 VITALS — BP 129/73 | HR 96 | Temp 97.7°F | Resp 16 | Wt 226.6 lb

## 2019-09-10 DIAGNOSIS — R0602 Shortness of breath: Secondary | ICD-10-CM | POA: Insufficient documentation

## 2019-09-10 DIAGNOSIS — Z803 Family history of malignant neoplasm of breast: Secondary | ICD-10-CM | POA: Insufficient documentation

## 2019-09-10 DIAGNOSIS — Z170 Estrogen receptor positive status [ER+]: Secondary | ICD-10-CM | POA: Diagnosis not present

## 2019-09-10 DIAGNOSIS — M8580 Other specified disorders of bone density and structure, unspecified site: Secondary | ICD-10-CM | POA: Diagnosis not present

## 2019-09-10 DIAGNOSIS — K219 Gastro-esophageal reflux disease without esophagitis: Secondary | ICD-10-CM | POA: Insufficient documentation

## 2019-09-10 DIAGNOSIS — C50411 Malignant neoplasm of upper-outer quadrant of right female breast: Secondary | ICD-10-CM | POA: Diagnosis not present

## 2019-09-10 DIAGNOSIS — E785 Hyperlipidemia, unspecified: Secondary | ICD-10-CM | POA: Diagnosis not present

## 2019-09-10 DIAGNOSIS — I509 Heart failure, unspecified: Secondary | ICD-10-CM | POA: Diagnosis not present

## 2019-09-10 DIAGNOSIS — I10 Essential (primary) hypertension: Secondary | ICD-10-CM | POA: Insufficient documentation

## 2019-09-10 DIAGNOSIS — Z87891 Personal history of nicotine dependence: Secondary | ICD-10-CM | POA: Diagnosis not present

## 2019-09-10 DIAGNOSIS — Z79899 Other long term (current) drug therapy: Secondary | ICD-10-CM | POA: Insufficient documentation

## 2019-09-10 NOTE — Consult Note (Signed)
NEW PATIENT EVALUATION  Name: Melissa Merritt  MRN: [id number]  Date:   09/10/2019     DOB: May 08, 1950   This 69 y.o. 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  Of the right breast status post wide local excision and sentinel node biopsy  REFERRING PHYSICIAN: Venita Lick, NP  CHIEF COMPLAINT:  Chief Complaint  Patient presents with  . Breast Cancer    Initial consult    DIAGNOSIS: The encounter diagnosis was Malignant neoplasm of upper-outer quadrant of right breast in female, estrogen receptor positive ([place]).   PREVIOUS INVESTIGATIONS:  Pathology report reviewed Mammogram ultrasound reviewed Clinical notes reviewed  HPI: Patient is a 69 year old female who presented with an abnormal mammogram of her right breast.Patient is a 69 year old female there was architectural distortion in the upper outer quadrant of the right breast with confirmation on ultrasound.  No right axillary lymphadenopathy was noted.  She underwent biopsy which showed invasive mammary carcinoma of no special type.  She then underwent a wide local excision and sentinel node biopsy for 4 mm invasive mammary carcinoma overall grade 1.  Margins were clear at greater than 1 cm.  1 sentinel lymph node was negative for metastatic involvement.  Again tumor was ER/PR positive HER-2/neu not overexpressed.  She is done well postoperatively.  She is now referred to radiation oncology for consideration of treatment.  She specifically denies breast tenderness cough or bone pain.  PLANNED TREATMENT REGIMEN: Right whole breast radiation  PAST MEDICAL HISTORY:  has a past medical history of Benign tumor of lung, Cancer ([place]), CHF (congestive heart failure) ([place]), Dyspnea, GERD (gastroesophageal reflux disease), Hyperlipidemia, Hypertension, Insomnia, Osteopenia, Plantar fasciitis, PONV (postoperative nausea and vomiting), S/P pneumonectomy, and  Urinary incontinence.    PAST SURGICAL HISTORY:  Past Surgical History:  Procedure Laterality Date  . BREAST BIOPSY Right 07/17/2019   Affirm bx-"Ribbon" clip-path pending  . bronchoscopy    . CATARACT EXTRACTION, BILATERAL     Left- 11/21/15, right- 12/23/15  . CHOLECYSTECTOMY    . CYSTECTOMY  11/03/09   cheek  . dilation and curretage    . EYE SURGERY Bilateral    cataract  . FOOT SURGERY Bilateral 2010  . LOBECTOMY    . LUNG SURGERY Right   . PART MASTECTOMY,RADIO FREQUENCY LOCALIZER,AXILLARY SENTINEL NODE BIOPSY Right 08/13/2019   Procedure: PART MASTECTOMY,RADIO FREQUENCY LOCALIZER,AXILLARY SENTINEL NODE BIOPSY;  Surgeon: Benjamine Sprague, DO;  Location: [facility] ORS;  Service: General;  Laterality: Right;  . UPPER GI ENDOSCOPY  2005    FAMILY HISTORY: family history includes Aortic aneurysm in her maternal aunt; Breast cancer (age of onset: 48) in her maternal aunt; COPD in her mother; Cancer in her maternal aunt; Emphysema in her mother; Heart disease in her father, maternal grandfather, maternal grandmother, paternal grandfather, and paternal grandmother; Hyperlipidemia in her brother and sister; Hypertension in her mother; Osteoporosis in her maternal aunt and mother; Stroke in her paternal grandfather and paternal grandmother.  SOCIAL HISTORY:  reports that she quit smoking about 40 years ago. She has never used smokeless tobacco. She reports that she does not drink alcohol or use drugs.  ALLERGIES: Contrast media [iodinated diagnostic agents], Other, Ace inhibitors, Hctz [hydrochlorothiazide], and Keflex [cephalexin]  MEDICATIONS:  Current Outpatient Medications  Medication Sig Dispense Refill  . acetaminophen (TYLENOL) 325 MG tablet Take 2 tablets (650 mg total) by mouth every 8 (eight) hours as needed for mild pain.  40 tablet 0  . amLODipine (NORVASC) 5 MG tablet Take 1 tablet (5 mg total) by mouth daily. 90 tablet 3  . amoxicillin (AMOXIL) 500 MG capsule Take 1 capsule by mouth in  the morning and at bedtime.    Marland Kitchen aspirin 81 MG tablet Take 81 mg by mouth daily.    . Biotin 5000 MCG TABS Take 5,000 mcg by mouth 2 (two) times daily.    . Calcium Carb-Cholecalciferol (CALCIUM-VITAMIN D) 600-400 MG-UNIT TABS Take 1 tablet by mouth 2 (two) times daily.    . cholecalciferol (VITAMIN D3) 25 MCG (1000 UNIT) tablet Take 1,000 Units by mouth in the morning and at bedtime.    . Cyanocobalamin (B-12) 2500 MCG TABS Take 2,500 mcg by mouth daily.    Marland Kitchen HYDROcodone-acetaminophen (NORCO) 5-325 MG tablet Take 1 tablet by mouth every 6 (six) hours as needed for up to 6 doses for moderate pain. 6 tablet 0  . ibuprofen (IBU-200) 200 MG tablet Take 400 mg by mouth every 6 (six) hours as needed for moderate pain.     . metoprolol succinate (TOPROL-XL) 25 MG 24 hr tablet Take 1 tablet (25 mg total) by mouth daily. 90 tablet 3  . pantoprazole (PROTONIX) 40 MG tablet Take 1 tablet (40 mg total) by mouth 2 (two) times daily. 180 tablet 3  . Red Yeast Rice Extract (RED YEAST RICE PO) Take 1 tablet by mouth in the morning and at bedtime.    . St Johns Wort 300 MG CAPS Take 300 mg by mouth 2 (two) times daily.    Marland Kitchen umeclidinium-vilanterol (ANORO ELLIPTA) 62.5-25 MCG/INH AEPB Inhale into the lungs.    . valsartan (DIOVAN) 160 MG tablet Take 1 tablet (160 mg total) by mouth daily. 90 tablet 3   No current facility-administered medications for this encounter.    ECOG PERFORMANCE STATUS:  0 - Asymptomatic  REVIEW OF SYSTEMS: Patient denies any weight loss, fatigue, weakness, fever, chills or night sweats. Patient denies any loss of vision, blurred vision. Patient denies any ringing  of the ears or hearing loss. No irregular heartbeat. Patient denies heart murmur or history of fainting. Patient denies any chest pain or pain radiating to her upper extremities. Patient denies any shortness of breath, difficulty breathing at night, cough or hemoptysis. Patient denies any swelling in the lower legs. Patient  denies any nausea vomiting, vomiting of blood, or coffee ground material in the vomitus. Patient denies any stomach pain. Patient states has had normal bowel movements no significant constipation or diarrhea. Patient denies any dysuria, hematuria or significant nocturia. Patient denies any problems walking, swelling in the joints or loss of balance. Patient denies any skin changes, loss of hair or loss of weight. Patient denies any excessive worrying or anxiety or significant depression. Patient denies any problems with insomnia. Patient denies excessive thirst, polyuria, polydipsia. Patient denies any swollen glands, patient denies easy bruising or easy bleeding. Patient denies any recent infections, allergies or URI. Patient "s visual fields have not changed significantly in recent time.   PHYSICAL EXAM: BP 129/73 (BP Location: Left Arm, Patient Position: Sitting)   Pulse 96   Temp 97.7 F (36.5 C) (Tympanic)   Resp 16   Wt 226 lb 9.6 oz (102.8 kg)   BMI 35.49 kg/m  Patient status post wide local excision of the right breast.  No dominant mass or nodularities noted in either breast in 2 positions examined.  No axillary or supraclavicular adenopathy appreciated.  She has extremely  large and pendulous breast.  Well-developed well-nourished patient in NAD. HEENT reveals PERLA, EOMI, discs not visualized.  Oral cavity is clear. No oral mucosal lesions are identified. Neck is clear without evidence of cervical or supraclavicular adenopathy. Lungs are clear to A&P. Cardiac examination is essentially unremarkable with regular rate and rhythm without murmur rub or thrill. Abdomen is benign with no organomegaly or masses noted. Motor sensory and DTR levels are equal and symmetric in the upper and lower extremities. Cranial nerves II through XII are grossly intact. Proprioception is intact. No peripheral adenopathy or edema is identified. No motor or sensory levels are noted. Crude visual fields are within normal  range.  LABORATORY DATA: Pathology report reviewed    RADIOLOGY RESULTS: Mammogram and ultrasound reviewed   IMPRESSION: Stage I invasive mammary carcinoma the right breast status post wide local excision and sentinel node biopsy ER/PR positive HER-2 negative in 69 year old female  PLAN: This time her breast is too large for hypofractionated course of treatment.  Would plan on delivering 5040 cGy in 28 fractions to her right breast.  Would also boost her scar another 1400 centigrade.  Risks and benefits of treatment occluding skin reaction fatigue alteration of blood counts possible occlusion of superficial lung all were discussed in detail.  Patient comprehends my treatment plan well.  I personally set up and ordered CT simulation.  Patient also will benefit from antiestrogen therapy after completion of radiation.  I would like to take this opportunity to thank you for allowing me to participate in the care of your patient.Noreene Filbert, MD

## 2019-09-11 ENCOUNTER — Encounter: Payer: Self-pay | Admitting: Hematology and Oncology

## 2019-09-11 NOTE — Progress Notes (Unsigned)
Patient was called for pre assesment. She denies any pain or concerns at this time.

## 2019-09-13 NOTE — Progress Notes (Unsigned)
[facility]  [address], [address] [place], [place] [address] Phone: [phone]  Fax: [phone]   Clinic Day:  09/13/2019  Referring physician: Venita Lick, NP  Chief Complaint: Melissa Merritt is a 69 y.o. female with right breast cancer who is seen for review of pathology and discussion regarding direction of therapy.   HPI:  The patient was last seen in the medical oncology clinic on 09/01/2019. At that time, she was doing well.  Incisions were healing well.  We discussed endocrine therapy following completion of radiation.  She was seen by Dr Baruch Gouty on 09/10/2019.  Plan was for 5040 cGy in 28 fractions to her right breast and a 1400 cGy boost her scar.   During the interim,   Past Medical History:  Diagnosis Date  . Benign tumor of lung   . Cancer [facility])    right breast  . CHF (congestive heart failure) ([place])   . Dyspnea   . GERD (gastroesophageal reflux disease)   . Hyperlipidemia   . Hypertension   . Insomnia   . Osteopenia   . Plantar fasciitis   . PONV (postoperative nausea and vomiting)    vomiting 2 days after lung surgery  . S/P pneumonectomy    right lower lobe  . Urinary incontinence     Past Surgical History:  Procedure Laterality Date  . BREAST BIOPSY Right 07/17/2019   Affirm bx-"Ribbon" clip-path pending  . bronchoscopy    . CATARACT EXTRACTION, BILATERAL     Left- 11/21/15, right- 12/23/15  . CHOLECYSTECTOMY    . CYSTECTOMY  11/03/09   cheek  . dilation and curretage    . EYE SURGERY Bilateral    cataract  . FOOT SURGERY Bilateral 2010  . LOBECTOMY    . LUNG SURGERY Right   . PART MASTECTOMY,RADIO FREQUENCY LOCALIZER,AXILLARY SENTINEL NODE BIOPSY Right 08/13/2019   Procedure: PART MASTECTOMY,RADIO FREQUENCY LOCALIZER,AXILLARY SENTINEL NODE BIOPSY;  Surgeon: Benjamine Sprague, DO;  Location: [facility] ORS;  Service: General;  Laterality: Right;  . UPPER GI ENDOSCOPY  2005    Family History  Problem Relation Age of  Onset  . COPD Mother   . Hypertension Mother   . Emphysema Mother   . Osteoporosis Mother   . Heart disease Father   . Hyperlipidemia Sister   . Hyperlipidemia Brother   . Heart disease Maternal Grandmother   . Heart disease Maternal Grandfather   . Heart disease Paternal Grandmother   . Stroke Paternal Grandmother   . Heart disease Paternal Grandfather   . Stroke Paternal Grandfather   . Cancer Maternal Aunt        breast and lung  . Osteoporosis Maternal Aunt   . Aortic aneurysm Maternal Aunt   . Breast cancer Maternal Aunt 76    Social History:  reports that she quit smoking about 40 years ago. She has never used smokeless tobacco. She reports that she does not drink alcohol or use drugs. She has not been exposed to radiations or toxins. She is retired. She used to work at [employer].  She lives in [place].  The patient is alone today.   Allergies:  Allergies  Allergen Reactions  . Contrast Media [Iodinated Diagnostic Agents] Swelling    Eye swelling shut  . Other Swelling    Eye swelling shut IV contrast media   . Ace Inhibitors Other (See Comments)    "Makes me feel strange."  . Hctz [Hydrochlorothiazide] Other (See Comments)  arms ache  . Keflex [Cephalexin] Other (See Comments)    thrush    Current Medications: Current Outpatient Medications  Medication Sig Dispense Refill  . amLODipine (NORVASC) 5 MG tablet Take 1 tablet (5 mg total) by mouth daily. 90 tablet 3  . amoxicillin (AMOXIL) 500 MG capsule Take 1 capsule by mouth in the morning and at bedtime.    Marland Kitchen aspirin 81 MG tablet Take 81 mg by mouth daily.    . Biotin 5000 MCG TABS Take 5,000 mcg by mouth 2 (two) times daily.    . Calcium Carb-Cholecalciferol (CALCIUM-VITAMIN D) 600-400 MG-UNIT TABS Take 1 tablet by mouth 2 (two) times daily.    . cholecalciferol (VITAMIN D3) 25 MCG (1000 UNIT) tablet Take 1,000 Units by mouth in the morning and at bedtime.    . Cyanocobalamin (B-12) 2500 MCG TABS Take  2,500 mcg by mouth daily.    Marland Kitchen HYDROcodone-acetaminophen (NORCO) 5-325 MG tablet Take 1 tablet by mouth every 6 (six) hours as needed for up to 6 doses for moderate pain. 6 tablet 0  . ibuprofen (IBU-200) 200 MG tablet Take 400 mg by mouth every 6 (six) hours as needed for moderate pain.     . metoprolol succinate (TOPROL-XL) 25 MG 24 hr tablet Take 1 tablet (25 mg total) by mouth daily. 90 tablet 3  . pantoprazole (PROTONIX) 40 MG tablet Take 1 tablet (40 mg total) by mouth 2 (two) times daily. 180 tablet 3  . Red Yeast Rice Extract (RED YEAST RICE PO) Take 1 tablet by mouth in the morning and at bedtime.    . St Johns Wort 300 MG CAPS Take 300 mg by mouth 2 (two) times daily.    Marland Kitchen umeclidinium-vilanterol (ANORO ELLIPTA) 62.5-25 MCG/INH AEPB Inhale into the lungs.    . valsartan (DIOVAN) 160 MG tablet Take 1 tablet (160 mg total) by mouth daily. 90 tablet 3   No current facility-administered medications for this visit.    Review of Systems  Constitutional: Positive for weight loss (2 lbs). Negative for chills, diaphoresis, fever and malaise/fatigue.       Doing alright.  HENT: Positive for congestion. Negative for hearing loss, nosebleeds, sinus pain, sore throat and tinnitus.   Eyes: Negative for blurred vision and double vision.  Respiratory: Positive for shortness of breath (on exertion). Negative for cough.        Sleep apnea.  Cardiovascular: Positive for chest pain (right breast tenderness and soreness (3/10)) and leg swelling (when she travels). Negative for palpitations and orthopnea (1 pillow).  Gastrointestinal: Negative.  Negative for abdominal pain, blood in stool, constipation, diarrhea, heartburn, melena, nausea and vomiting.  Genitourinary: Negative for dysuria, frequency and urgency.       Incontinence s/p prolapsed uterus.  Musculoskeletal: Negative.  Negative for back pain, falls, joint pain and myalgias.  Skin: Negative.  Negative for itching and rash.  Neurological:  Negative.  Negative for dizziness, tingling, tremors, sensory change, speech change, focal weakness, weakness and headaches.  Endo/Heme/Allergies: Negative.  Does not bruise/bleed easily.  Psychiatric/Behavioral: Negative.  Negative for depression and memory loss. The patient is not nervous/anxious and does not have insomnia.   All other systems reviewed and are negative.  Performance status (ECOG):  1  Vitals: There were no vitals taken for this visit.   Physical Exam  Nursing note and vitals reviewed. Constitutional: She is oriented to person, place, and time. She appears well-developed and well-nourished. No distress.  HENT:  Head: Normocephalic and atraumatic.  Mouth/Throat: Oropharynx is clear and moist.  Short styled brown and blonde hair.  Mask.   Eyes: Pupils are equal, round, and reactive to light. Conjunctivae and EOM are normal. Right eye exhibits no discharge. Left eye exhibits no discharge. No scleral icterus.  Glasses propped up on head.  Neck: No JVD present.  Cardiovascular: Normal rate, regular rhythm, normal heart sounds and intact distal pulses. Exam reveals no gallop and no friction rub.  No murmur heard. Respiratory: Effort normal and breath sounds normal. No respiratory distress. She has no wheezes. She has no rales. She exhibits no tenderness. Right breast exhibits no inverted nipple, no mass, no nipple discharge, no skin change and no tenderness. Left breast exhibits no inverted nipple, no mass, no nipple discharge, no skin change and no tenderness.  Well healing right axillary and lumpectomy incision sites with mild post-operative changes.   Left breast with fibrocystic changes in the upper inner quadrant.  GI: Soft. Bowel sounds are normal. She exhibits no distension and no mass. There is no abdominal tenderness. There is no rebound and no guarding.  Musculoskeletal:        General: No tenderness or edema. Normal range of motion.     Cervical back: Normal range  of motion and neck supple.  Lymphadenopathy:       Head (right side): No preauricular, no posterior auricular and no occipital adenopathy present.       Head (left side): No preauricular, no posterior auricular and no occipital adenopathy present.    She has no cervical adenopathy.    She has no axillary adenopathy.       Right: No supraclavicular adenopathy present.       Left: No supraclavicular adenopathy present.  Neurological: She is alert and oriented to person, place, and time.  Skin: Skin is warm and dry. No rash noted. She is not diaphoretic. No erythema. No pallor.  Psychiatric: She has a normal mood and affect. Her behavior is normal. Judgment and thought content normal.    No visits with results within 3 Day(s) from this visit.  Latest known visit with results is:  Hospital Outpatient Visit on 07/17/2019  Component Date Value Ref Range Status  . SURGICAL PATHOLOGY 07/17/2019    Final-Edited                   Value:SURGICAL PATHOLOGY THIS IS AN ADDENDUM REPORT CASE: [id number] PATIENT: [name] Surgical Pathology Report Addendum  Reason for Addendum #1:  Breast Biomarker Results  Specimen Submitted: A. Breast, right, upper outer quadrant; biopsy  Clinical History: Small area of distortion right upper-outer breast seen at mammography with no [name] correlate.  Prob. CSL or [place]. Post biopsy mammograms show the ribbon-shaped biopsy marker clip 3 cm inferior and posterior to the biopsied distortion in the right breast in the lateral projection and at the location of the biopsied distortion in the craniocaudal projection.   DIAGNOSIS: A. BREAST, RIGHT, UPPER OUTER QUADRANT; STEREOTACTIC-GUIDED CORE BIOPSY: - INVASIVE MAMMARY CARCINOMA, NO SPECIAL TYPE.  Size of invasive carcinoma: 4 mm in this sample Histologic grade of invasive carcinoma: Grade 1                      Glandular/tubular differentiation score: 2                      Nucle  ar pleomorphism score: 1                      Mitotic rate score: 1                      Total score: 4 Ductal carcinoma in situ: Present, intermediate nuclear grade with central necrosis Lymphovascular invasion: Not identified Additional finding: Atypical lobular hyperplasia  ER/PR/HER2: Immunohistochemistry will be performed on block A2, with reflex to [place] for HER2 2+. The results will be reported in an addendum.  Comment: The definitive grade will be assigned on the excisional specimen.  GROSS DESCRIPTION: A. Labeled: Right breast, upper outer quadrant Received: in a formalin-filled [place] collection device Accompanying specimen radiograph: No Time/Date in fixative: Collected at 9:03 AM and placed into formalin at 9:06 AM on 07/17/2019. Cold ischemic time: Less than 5 minutes Total fixation time: 12 hours Core pieces: Multiple Measurement: Aggregate, 7.5 x 2.0 x 0.3 cm Description / comments: Received are needle core biopsy fragments of tan-yellow, fibrofatt                         y tissue Inked: Blue Entirely submitted in cassette(s): 1-6   Final Diagnosis performed by Bryan Lemma, MD.   Electronically signed 07/20/2019 1:50:46PM The electronic signature indicates that the named Attending Pathologist has evaluated the specimen Technical component performed at [facility], [address], [place], [place] [address] Lab: [phone] Dir: Rush Farmer, MD, MMM  Professional component performed at [facility], [facility], [place], [place], [place] [address] Lab: [phone] Dir: Dellia Nims. Rubinas, MD  ADDENDUM: BREAST BIOMARKER TESTS Estrogen Receptor (ER) Status: POSITIVE                      Percentage of cells with nuclear positivity: 91-100%                      Average intensity of staining: Strong  Progesterone Receptor (PgR) Status: POSITIVE                      Percentage of cells with nuclear positivity: 91-100%                       Average intensity of staining: Strong  HER2 (by immunohistochemistry): NEGATIVE (                         Score 0)  Cold Ischemia and Fixation Times: Meet requirements specified in latest version of the ASCO/CAP guidelines Testing Performed on Block Number(s): A2  METHODS Fixative: Formalin Estrogen Receptor:  FDA cleared ([place]) Primary Antibody:  SP1 Progesterone Receptor: FDA cleared ([place]) Primary Antibody: 1E2 HER2 (by IHC): FDA cleared ([place]) Primary Antibody: 4B5 ([facility]) Immunohistochemistry controls worked appropriately. Slides were prepared by [facility], [place], [place], and interpreted by Betsy Pries, MD.  This test was developed and its performance characteristics determined by [facility]. It has not been cleared or approved by the [name] Food and Drug Administration. The FDA does not require this test to go through premarket FDA review. This test is used for clinical purposes. It should not be regarded as investigational or for research. This laboratory is certified under the Clinical Laboratory Improvement Amendments (CLIA) as qualified to pe  rform high complexity clinical laboratory testing.  Addendum #1 performed by Betsy Pries, MD.   Electronically signed 07/23/2019 4:44:41PM The electronic signature indicates that the named Attending Pathologist has evaluated the specimen Technical component performed at [facility], [address] [place], [place] [address] Lab: [phone] Dir: Rush Farmer, MD, MMM  Professional component performed at [facility], [facility], [place], [place], [place] [address] Lab: [phone] Dir: Dellia Nims. Rubinas, MD    Assessment:  Melissa Merritt is a 70 y.o. female with stage IA right breast cancer s/p partial mastectomy and right axillary sentinel lymph node biopsy on 08/13/2019.   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  There was atypical lobular hyperplasia, with ductal involvement.  Additional posterior margin revealed no evidence of residual invasive carcinoma or DCIS.  Closest margin for invasive carcinoma was > 10 mm and 2 mm for DCIS.  One lymph node was negative for malignancy.  Tumor was ER + (91-100%), PR+ (91-100%), and Her2/neu -.  Pathologic stage was pT1a pN0 (sn).  She underwent stereotactic biopsy on 07/17/2019.  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    Bilateral diagnostic mammogram on 07/08/2019 revealed persistent architectural distortion involving the UPPER OUTER QUADRANT of the RIGHT breast without sonographic correlate.  There was no pathologic RIGHT axillary lymphadenopathy.  There was no mammographic or sonographic evidence of malignancy involving the LEFT breast.    She is scheduled for partial mastectomy with sentinel lymph node biopsy on 08/13/2019.  Bone density on 08/04/2019 revealed osteopenia with a T score of -1.5 in the AP spine L2-L4.  She has CHF and sleep apnea.  Echo on 07/02/2019 revealed an EF of 45%.  She is scheduled to have a root canal and a crown.  She received her last Moderna COVID-19 vaccine at the end or 06/2019.   Symptomatically, she is doing well.  Incisions are healing well.    Plan: 1.   Stage IA right breast cancer  She is s/p lumpectomy and SLN biopsy.  Pathology reviewed with patient.  Copy of report provided patient.   Tumor is 4 mm and grade 1.   Tumor is ER and PR positive and HER-2/neu negative.   As tumor is < 5 mm, discuss avoiding Oncotype DX testing.  Discuss plan for referral to radiation oncology.  Discuss plan for endocrine therapy (tamoxifen versus aromatase inhibitors) post radiation.   Potential side effects reviewed.   Patient is currently considering tamoxifen in the future  Anticipate follow-up after completion of  radiation 2.   Osteopenia  Review interval bone density study..  Discuss calcium, vitamin D and exercise.  Discussed consideration of Prolia.     Potential side effects were reviewed.     Discuss need for dental clearance. 3.   Radiation oncology consult. 4.   RTC after radiation for MD assessment, labs (CBC with diff, CMP, CA27.29), and initiation of endocrine therapy.  I discussed the assessment and treatment plan with the patient.  The patient was provided an opportunity to ask questions and all were answered.  The patient agreed with the plan and demonstrated an understanding of the instructions.  The patient was advised to call back if the symptoms worsen or if the condition fails to improve as anticipated.  I provided 20 minutes of face-to-face time during this this encounter and > 50% was  spent counseling as documented under my assessment and plan. An additional 10 minutes were spent reviewing her chart (Epic and Care Everywhere) including notes, labs, and imaging studies. An additional 10-12 minutes were spent reviewing her chart (Epic and Care Everywhere) including notes, labs, imaging studies, op-report and pathology.    Shalane Florendo C. Mike Gip, MD, PhD    09/13/2019, 10:15 AM  I, Selena Batten, am acting as scribe for [place]. Mike Gip, MD, PhD.  I, Loring Liskey C. Mike Gip, MD, have reviewed the above documentation for accuracy and completeness, and I agree with the above.

## 2019-09-14 ENCOUNTER — Inpatient Hospital Stay: Payer: Medicare Other | Admitting: Hematology and Oncology

## 2019-09-14 ENCOUNTER — Inpatient Hospital Stay: Payer: Medicare Other

## 2019-09-16 DIAGNOSIS — J4540 Moderate persistent asthma, uncomplicated: Secondary | ICD-10-CM | POA: Diagnosis not present

## 2019-09-17 ENCOUNTER — Ambulatory Visit
Admission: RE | Admit: 2019-09-17 | Discharge: 2019-09-17 | Disposition: A | Discharge status: Home / Self Care | Payer: Medicare Other | Source: Ambulatory Visit | Attending: Radiation Oncology | Admitting: Radiation Oncology

## 2019-09-17 DIAGNOSIS — C50411 Malignant neoplasm of upper-outer quadrant of right female breast: Secondary | ICD-10-CM | POA: Insufficient documentation

## 2019-09-17 DIAGNOSIS — Z510 Encounter for antineoplastic radiation therapy: Secondary | ICD-10-CM | POA: Insufficient documentation

## 2019-09-17 DIAGNOSIS — Z170 Estrogen receptor positive status [ER+]: Secondary | ICD-10-CM | POA: Insufficient documentation

## 2019-09-18 ENCOUNTER — Other Ambulatory Visit: Payer: Self-pay | Admitting: *Deleted

## 2019-09-18 DIAGNOSIS — C50411 Malignant neoplasm of upper-outer quadrant of right female breast: Secondary | ICD-10-CM

## 2019-09-23 DIAGNOSIS — Z170 Estrogen receptor positive status [ER+]: Secondary | ICD-10-CM | POA: Diagnosis not present

## 2019-09-23 DIAGNOSIS — C50411 Malignant neoplasm of upper-outer quadrant of right female breast: Secondary | ICD-10-CM | POA: Diagnosis not present

## 2019-09-23 DIAGNOSIS — Z510 Encounter for antineoplastic radiation therapy: Secondary | ICD-10-CM | POA: Diagnosis not present

## 2019-09-24 ENCOUNTER — Ambulatory Visit: Admission: RE | Admit: 2019-09-24 | Payer: Medicare Other | Source: Ambulatory Visit

## 2019-09-24 DIAGNOSIS — Z170 Estrogen receptor positive status [ER+]: Secondary | ICD-10-CM | POA: Diagnosis not present

## 2019-09-24 DIAGNOSIS — Z510 Encounter for antineoplastic radiation therapy: Secondary | ICD-10-CM | POA: Diagnosis not present

## 2019-09-24 DIAGNOSIS — C50411 Malignant neoplasm of upper-outer quadrant of right female breast: Secondary | ICD-10-CM | POA: Diagnosis not present

## 2019-09-28 ENCOUNTER — Ambulatory Visit
Admission: RE | Admit: 2019-09-28 | Discharge: 2019-09-28 | Disposition: A | Discharge status: Home / Self Care | Payer: Medicare Other | Source: Ambulatory Visit | Attending: Radiation Oncology | Admitting: Radiation Oncology

## 2019-09-28 DIAGNOSIS — Z510 Encounter for antineoplastic radiation therapy: Secondary | ICD-10-CM | POA: Diagnosis not present

## 2019-09-28 DIAGNOSIS — Z170 Estrogen receptor positive status [ER+]: Secondary | ICD-10-CM | POA: Diagnosis not present

## 2019-09-28 DIAGNOSIS — C50411 Malignant neoplasm of upper-outer quadrant of right female breast: Secondary | ICD-10-CM | POA: Diagnosis not present

## 2019-09-29 ENCOUNTER — Ambulatory Visit
Admission: RE | Admit: 2019-09-29 | Discharge: 2019-09-29 | Disposition: A | Discharge status: Home / Self Care | Payer: Medicare Other | Source: Ambulatory Visit | Attending: Radiation Oncology | Admitting: Radiation Oncology

## 2019-09-29 DIAGNOSIS — Z170 Estrogen receptor positive status [ER+]: Secondary | ICD-10-CM | POA: Diagnosis not present

## 2019-09-29 DIAGNOSIS — C50411 Malignant neoplasm of upper-outer quadrant of right female breast: Secondary | ICD-10-CM | POA: Diagnosis not present

## 2019-09-29 DIAGNOSIS — Z510 Encounter for antineoplastic radiation therapy: Secondary | ICD-10-CM | POA: Diagnosis not present

## 2019-09-30 ENCOUNTER — Ambulatory Visit
Admission: RE | Admit: 2019-09-30 | Discharge: 2019-09-30 | Disposition: A | Discharge status: Home / Self Care | Payer: Medicare Other | Source: Ambulatory Visit | Attending: Radiation Oncology | Admitting: Radiation Oncology

## 2019-09-30 DIAGNOSIS — C50411 Malignant neoplasm of upper-outer quadrant of right female breast: Secondary | ICD-10-CM | POA: Diagnosis not present

## 2019-09-30 DIAGNOSIS — Z170 Estrogen receptor positive status [ER+]: Secondary | ICD-10-CM | POA: Diagnosis not present

## 2019-09-30 DIAGNOSIS — Z510 Encounter for antineoplastic radiation therapy: Secondary | ICD-10-CM | POA: Diagnosis not present

## 2019-10-01 ENCOUNTER — Ambulatory Visit
Admission: RE | Admit: 2019-10-01 | Discharge: 2019-10-01 | Disposition: A | Discharge status: Home / Self Care | Payer: Medicare Other | Source: Ambulatory Visit | Attending: Radiation Oncology | Admitting: Radiation Oncology

## 2019-10-01 DIAGNOSIS — C50411 Malignant neoplasm of upper-outer quadrant of right female breast: Secondary | ICD-10-CM | POA: Diagnosis not present

## 2019-10-01 DIAGNOSIS — Z170 Estrogen receptor positive status [ER+]: Secondary | ICD-10-CM | POA: Diagnosis not present

## 2019-10-01 DIAGNOSIS — Z510 Encounter for antineoplastic radiation therapy: Secondary | ICD-10-CM | POA: Diagnosis not present

## 2019-10-02 ENCOUNTER — Ambulatory Visit
Admission: RE | Admit: 2019-10-02 | Discharge: 2019-10-02 | Disposition: A | Discharge status: Home / Self Care | Payer: Medicare Other | Source: Ambulatory Visit | Attending: Radiation Oncology | Admitting: Radiation Oncology

## 2019-10-02 DIAGNOSIS — Z510 Encounter for antineoplastic radiation therapy: Secondary | ICD-10-CM | POA: Diagnosis not present

## 2019-10-02 DIAGNOSIS — C50411 Malignant neoplasm of upper-outer quadrant of right female breast: Secondary | ICD-10-CM | POA: Diagnosis not present

## 2019-10-02 DIAGNOSIS — Z170 Estrogen receptor positive status [ER+]: Secondary | ICD-10-CM | POA: Diagnosis not present

## 2019-10-05 ENCOUNTER — Ambulatory Visit
Admission: RE | Admit: 2019-10-05 | Discharge: 2019-10-05 | Disposition: A | Discharge status: Home / Self Care | Payer: Medicare Other | Source: Ambulatory Visit | Attending: Radiation Oncology | Admitting: Radiation Oncology

## 2019-10-05 DIAGNOSIS — C50411 Malignant neoplasm of upper-outer quadrant of right female breast: Secondary | ICD-10-CM | POA: Diagnosis not present

## 2019-10-05 DIAGNOSIS — Z170 Estrogen receptor positive status [ER+]: Secondary | ICD-10-CM | POA: Diagnosis not present

## 2019-10-05 DIAGNOSIS — Z510 Encounter for antineoplastic radiation therapy: Secondary | ICD-10-CM | POA: Diagnosis not present

## 2019-10-06 ENCOUNTER — Ambulatory Visit
Admission: RE | Admit: 2019-10-06 | Discharge: 2019-10-06 | Disposition: A | Discharge status: Home / Self Care | Payer: Medicare Other | Source: Ambulatory Visit | Attending: Radiation Oncology | Admitting: Radiation Oncology

## 2019-10-06 DIAGNOSIS — Z510 Encounter for antineoplastic radiation therapy: Secondary | ICD-10-CM | POA: Diagnosis not present

## 2019-10-06 DIAGNOSIS — C50411 Malignant neoplasm of upper-outer quadrant of right female breast: Secondary | ICD-10-CM | POA: Diagnosis not present

## 2019-10-06 DIAGNOSIS — Z170 Estrogen receptor positive status [ER+]: Secondary | ICD-10-CM | POA: Diagnosis not present

## 2019-10-07 ENCOUNTER — Ambulatory Visit
Admission: RE | Admit: 2019-10-07 | Discharge: 2019-10-07 | Disposition: A | Discharge status: Home / Self Care | Payer: Medicare Other | Source: Ambulatory Visit | Attending: Radiation Oncology | Admitting: Radiation Oncology

## 2019-10-07 DIAGNOSIS — Z510 Encounter for antineoplastic radiation therapy: Secondary | ICD-10-CM | POA: Diagnosis not present

## 2019-10-07 DIAGNOSIS — Z170 Estrogen receptor positive status [ER+]: Secondary | ICD-10-CM | POA: Diagnosis not present

## 2019-10-07 DIAGNOSIS — C50411 Malignant neoplasm of upper-outer quadrant of right female breast: Secondary | ICD-10-CM | POA: Diagnosis not present

## 2019-10-08 ENCOUNTER — Ambulatory Visit
Admission: RE | Admit: 2019-10-08 | Discharge: 2019-10-08 | Disposition: A | Discharge status: Home / Self Care | Payer: Medicare Other | Source: Ambulatory Visit | Attending: Radiation Oncology | Admitting: Radiation Oncology

## 2019-10-08 DIAGNOSIS — Z170 Estrogen receptor positive status [ER+]: Secondary | ICD-10-CM | POA: Insufficient documentation

## 2019-10-08 DIAGNOSIS — Z510 Encounter for antineoplastic radiation therapy: Secondary | ICD-10-CM | POA: Diagnosis not present

## 2019-10-08 DIAGNOSIS — C50411 Malignant neoplasm of upper-outer quadrant of right female breast: Secondary | ICD-10-CM | POA: Diagnosis not present

## 2019-10-09 ENCOUNTER — Ambulatory Visit
Admission: RE | Admit: 2019-10-09 | Discharge: 2019-10-09 | Disposition: A | Discharge status: Home / Self Care | Payer: Medicare Other | Source: Ambulatory Visit | Attending: Radiation Oncology | Admitting: Radiation Oncology

## 2019-10-09 DIAGNOSIS — C50411 Malignant neoplasm of upper-outer quadrant of right female breast: Secondary | ICD-10-CM | POA: Diagnosis not present

## 2019-10-09 DIAGNOSIS — Z170 Estrogen receptor positive status [ER+]: Secondary | ICD-10-CM | POA: Diagnosis not present

## 2019-10-09 DIAGNOSIS — Z510 Encounter for antineoplastic radiation therapy: Secondary | ICD-10-CM | POA: Diagnosis not present

## 2019-10-13 ENCOUNTER — Ambulatory Visit
Admission: RE | Admit: 2019-10-13 | Discharge: 2019-10-13 | Disposition: A | Discharge status: Home / Self Care | Payer: Medicare Other | Source: Ambulatory Visit | Attending: Radiation Oncology | Admitting: Radiation Oncology

## 2019-10-13 DIAGNOSIS — C50411 Malignant neoplasm of upper-outer quadrant of right female breast: Secondary | ICD-10-CM | POA: Diagnosis not present

## 2019-10-13 DIAGNOSIS — Z170 Estrogen receptor positive status [ER+]: Secondary | ICD-10-CM | POA: Diagnosis not present

## 2019-10-13 DIAGNOSIS — Z510 Encounter for antineoplastic radiation therapy: Secondary | ICD-10-CM | POA: Diagnosis not present

## 2019-10-14 ENCOUNTER — Ambulatory Visit
Admission: RE | Admit: 2019-10-14 | Discharge: 2019-10-14 | Disposition: A | Discharge status: Home / Self Care | Payer: Medicare Other | Source: Ambulatory Visit | Attending: Radiation Oncology | Admitting: Radiation Oncology

## 2019-10-14 DIAGNOSIS — C50411 Malignant neoplasm of upper-outer quadrant of right female breast: Secondary | ICD-10-CM | POA: Diagnosis not present

## 2019-10-14 DIAGNOSIS — Z170 Estrogen receptor positive status [ER+]: Secondary | ICD-10-CM | POA: Diagnosis not present

## 2019-10-14 DIAGNOSIS — Z510 Encounter for antineoplastic radiation therapy: Secondary | ICD-10-CM | POA: Diagnosis not present

## 2019-10-15 ENCOUNTER — Other Ambulatory Visit: Payer: Self-pay

## 2019-10-15 ENCOUNTER — Ambulatory Visit
Admission: RE | Admit: 2019-10-15 | Discharge: 2019-10-15 | Disposition: A | Discharge status: Home / Self Care | Payer: Medicare Other | Source: Ambulatory Visit | Attending: Radiation Oncology | Admitting: Radiation Oncology

## 2019-10-15 ENCOUNTER — Inpatient Hospital Stay: Payer: Medicare Other | Attending: Hematology and Oncology

## 2019-10-15 DIAGNOSIS — Z510 Encounter for antineoplastic radiation therapy: Secondary | ICD-10-CM | POA: Diagnosis not present

## 2019-10-15 DIAGNOSIS — Z170 Estrogen receptor positive status [ER+]: Secondary | ICD-10-CM | POA: Insufficient documentation

## 2019-10-15 DIAGNOSIS — C50411 Malignant neoplasm of upper-outer quadrant of right female breast: Secondary | ICD-10-CM

## 2019-10-15 DIAGNOSIS — M8580 Other specified disorders of bone density and structure, unspecified site: Secondary | ICD-10-CM | POA: Insufficient documentation

## 2019-10-15 DIAGNOSIS — M8588 Other specified disorders of bone density and structure, other site: Secondary | ICD-10-CM

## 2019-10-15 LAB — CBC WITH DIFFERENTIAL/PLATELET
Abs Immature Granulocytes: 0.08 10*3/uL — ABNORMAL HIGH (ref 0.00–0.07)
Basophils Absolute: 0.1 10*3/uL (ref 0.0–0.1)
Basophils Relative: 1 %
Eosinophils Absolute: 0.4 10*3/uL (ref 0.0–0.5)
Eosinophils Relative: 6 %
HCT: 35.9 % — ABNORMAL LOW (ref 36.0–46.0)
Hemoglobin: 12.3 g/dL (ref 12.0–15.0)
Immature Granulocytes: 1 %
Lymphocytes Relative: 16 %
Lymphs Abs: 1 10*3/uL (ref 0.7–4.0)
MCH: 29.3 pg (ref 26.0–34.0)
MCHC: 34.3 g/dL (ref 30.0–36.0)
MCV: 85.5 fL (ref 80.0–100.0)
Monocytes Absolute: 0.7 10*3/uL (ref 0.1–1.0)
Monocytes Relative: 10 %
Neutro Abs: 4.3 10*3/uL (ref 1.7–7.7)
Neutrophils Relative %: 66 %
Platelets: 224 10*3/uL (ref 150–400)
RBC: 4.2 MIL/uL (ref 3.87–5.11)
RDW: 14.3 % (ref 11.5–15.5)
WBC: 6.5 10*3/uL (ref 4.0–10.5)
nRBC: 0 % (ref 0.0–0.2)

## 2019-10-15 LAB — COMPREHENSIVE METABOLIC PANEL
ALT: 26 U/L (ref 0–44)
AST: 22 U/L (ref 15–41)
Albumin: 4 g/dL (ref 3.5–5.0)
Alkaline Phosphatase: 87 U/L (ref 38–126)
Anion gap: 9 (ref 5–15)
BUN: 16 mg/dL (ref 8–23)
CO2: 28 mmol/L (ref 22–32)
Calcium: 9.5 mg/dL (ref 8.9–10.3)
Chloride: 104 mmol/L (ref 98–111)
Creatinine, Ser: 0.71 mg/dL (ref 0.44–1.00)
GFR calc Af Amer: >60 mL/min (ref >60)
GFR calc non Af Amer: >60 mL/min (ref >60)
Glucose, Bld: 127 mg/dL — ABNORMAL HIGH (ref 70–99)
Potassium: 3.8 mmol/L (ref 3.5–5.1)
Sodium: 141 mmol/L (ref 135–145)
Total Bilirubin: 0.3 mg/dL (ref 0.3–1.2)
Total Protein: 7.2 g/dL (ref 6.5–8.1)

## 2019-10-16 ENCOUNTER — Ambulatory Visit
Admission: RE | Admit: 2019-10-16 | Discharge: 2019-10-16 | Disposition: A | Discharge status: Home / Self Care | Payer: Medicare Other | Source: Ambulatory Visit | Attending: Radiation Oncology | Admitting: Radiation Oncology

## 2019-10-16 DIAGNOSIS — Z170 Estrogen receptor positive status [ER+]: Secondary | ICD-10-CM | POA: Diagnosis not present

## 2019-10-16 DIAGNOSIS — Z510 Encounter for antineoplastic radiation therapy: Secondary | ICD-10-CM | POA: Diagnosis not present

## 2019-10-16 DIAGNOSIS — C50411 Malignant neoplasm of upper-outer quadrant of right female breast: Secondary | ICD-10-CM | POA: Diagnosis not present

## 2019-10-16 LAB — CANCER ANTIGEN 27.29: CA 27.29: 18.9 U/mL (ref 0.0–38.6)

## 2019-10-19 ENCOUNTER — Ambulatory Visit
Admission: RE | Admit: 2019-10-19 | Discharge: 2019-10-19 | Disposition: A | Discharge status: Home / Self Care | Payer: Medicare Other | Source: Ambulatory Visit | Attending: Radiation Oncology | Admitting: Radiation Oncology

## 2019-10-19 DIAGNOSIS — Z510 Encounter for antineoplastic radiation therapy: Secondary | ICD-10-CM | POA: Diagnosis not present

## 2019-10-19 DIAGNOSIS — Z170 Estrogen receptor positive status [ER+]: Secondary | ICD-10-CM | POA: Diagnosis not present

## 2019-10-19 DIAGNOSIS — C50411 Malignant neoplasm of upper-outer quadrant of right female breast: Secondary | ICD-10-CM | POA: Diagnosis not present

## 2019-10-20 ENCOUNTER — Ambulatory Visit
Admission: RE | Admit: 2019-10-20 | Discharge: 2019-10-20 | Disposition: A | Discharge status: Home / Self Care | Payer: Medicare Other | Source: Ambulatory Visit | Attending: Radiation Oncology | Admitting: Radiation Oncology

## 2019-10-20 DIAGNOSIS — Z510 Encounter for antineoplastic radiation therapy: Secondary | ICD-10-CM | POA: Diagnosis not present

## 2019-10-20 DIAGNOSIS — C50411 Malignant neoplasm of upper-outer quadrant of right female breast: Secondary | ICD-10-CM | POA: Diagnosis not present

## 2019-10-20 DIAGNOSIS — Z170 Estrogen receptor positive status [ER+]: Secondary | ICD-10-CM | POA: Diagnosis not present

## 2019-10-21 ENCOUNTER — Ambulatory Visit
Admission: RE | Admit: 2019-10-21 | Discharge: 2019-10-21 | Disposition: A | Discharge status: Home / Self Care | Payer: Medicare Other | Source: Ambulatory Visit | Attending: Radiation Oncology | Admitting: Radiation Oncology

## 2019-10-21 DIAGNOSIS — Z170 Estrogen receptor positive status [ER+]: Secondary | ICD-10-CM | POA: Diagnosis not present

## 2019-10-21 DIAGNOSIS — Z510 Encounter for antineoplastic radiation therapy: Secondary | ICD-10-CM | POA: Diagnosis not present

## 2019-10-21 DIAGNOSIS — C50411 Malignant neoplasm of upper-outer quadrant of right female breast: Secondary | ICD-10-CM | POA: Diagnosis not present

## 2019-10-22 ENCOUNTER — Ambulatory Visit
Admission: RE | Admit: 2019-10-22 | Discharge: 2019-10-22 | Disposition: A | Discharge status: Home / Self Care | Payer: Medicare Other | Source: Ambulatory Visit | Attending: Radiation Oncology | Admitting: Radiation Oncology

## 2019-10-22 DIAGNOSIS — Z170 Estrogen receptor positive status [ER+]: Secondary | ICD-10-CM | POA: Diagnosis not present

## 2019-10-22 DIAGNOSIS — C50411 Malignant neoplasm of upper-outer quadrant of right female breast: Secondary | ICD-10-CM | POA: Diagnosis not present

## 2019-10-22 DIAGNOSIS — Z510 Encounter for antineoplastic radiation therapy: Secondary | ICD-10-CM | POA: Diagnosis not present

## 2019-10-23 ENCOUNTER — Ambulatory Visit
Admission: RE | Admit: 2019-10-23 | Discharge: 2019-10-23 | Disposition: A | Discharge status: Home / Self Care | Payer: Medicare Other | Source: Ambulatory Visit | Attending: Radiation Oncology | Admitting: Radiation Oncology

## 2019-10-23 DIAGNOSIS — Z510 Encounter for antineoplastic radiation therapy: Secondary | ICD-10-CM | POA: Diagnosis not present

## 2019-10-23 DIAGNOSIS — Z170 Estrogen receptor positive status [ER+]: Secondary | ICD-10-CM | POA: Diagnosis not present

## 2019-10-23 DIAGNOSIS — C50411 Malignant neoplasm of upper-outer quadrant of right female breast: Secondary | ICD-10-CM | POA: Diagnosis not present

## 2019-10-26 ENCOUNTER — Ambulatory Visit
Admission: RE | Admit: 2019-10-26 | Discharge: 2019-10-26 | Disposition: A | Discharge status: Home / Self Care | Payer: Medicare Other | Source: Ambulatory Visit | Attending: Radiation Oncology | Admitting: Radiation Oncology

## 2019-10-26 DIAGNOSIS — Z170 Estrogen receptor positive status [ER+]: Secondary | ICD-10-CM | POA: Diagnosis not present

## 2019-10-26 DIAGNOSIS — C50411 Malignant neoplasm of upper-outer quadrant of right female breast: Secondary | ICD-10-CM | POA: Diagnosis not present

## 2019-10-26 DIAGNOSIS — Z510 Encounter for antineoplastic radiation therapy: Secondary | ICD-10-CM | POA: Diagnosis not present

## 2019-10-27 ENCOUNTER — Ambulatory Visit
Admission: RE | Admit: 2019-10-27 | Discharge: 2019-10-27 | Disposition: A | Discharge status: Home / Self Care | Payer: Medicare Other | Source: Ambulatory Visit | Attending: Radiation Oncology | Admitting: Radiation Oncology

## 2019-10-27 DIAGNOSIS — Z170 Estrogen receptor positive status [ER+]: Secondary | ICD-10-CM | POA: Diagnosis not present

## 2019-10-27 DIAGNOSIS — C50411 Malignant neoplasm of upper-outer quadrant of right female breast: Secondary | ICD-10-CM | POA: Diagnosis not present

## 2019-10-27 DIAGNOSIS — Z510 Encounter for antineoplastic radiation therapy: Secondary | ICD-10-CM | POA: Diagnosis not present

## 2019-10-28 ENCOUNTER — Ambulatory Visit
Admission: RE | Admit: 2019-10-28 | Discharge: 2019-10-28 | Disposition: A | Discharge status: Home / Self Care | Payer: Medicare Other | Source: Ambulatory Visit | Attending: Radiation Oncology | Admitting: Radiation Oncology

## 2019-10-28 DIAGNOSIS — Z170 Estrogen receptor positive status [ER+]: Secondary | ICD-10-CM | POA: Diagnosis not present

## 2019-10-28 DIAGNOSIS — Z510 Encounter for antineoplastic radiation therapy: Secondary | ICD-10-CM | POA: Diagnosis not present

## 2019-10-28 DIAGNOSIS — C50411 Malignant neoplasm of upper-outer quadrant of right female breast: Secondary | ICD-10-CM | POA: Diagnosis not present

## 2019-10-29 ENCOUNTER — Inpatient Hospital Stay: Payer: Medicare Other

## 2019-10-29 ENCOUNTER — Other Ambulatory Visit: Payer: Self-pay

## 2019-10-29 ENCOUNTER — Ambulatory Visit
Admission: RE | Admit: 2019-10-29 | Discharge: 2019-10-29 | Disposition: A | Discharge status: Home / Self Care | Payer: Medicare Other | Source: Ambulatory Visit | Attending: Radiation Oncology | Admitting: Radiation Oncology

## 2019-10-29 DIAGNOSIS — Z510 Encounter for antineoplastic radiation therapy: Secondary | ICD-10-CM | POA: Diagnosis not present

## 2019-10-29 DIAGNOSIS — C50411 Malignant neoplasm of upper-outer quadrant of right female breast: Secondary | ICD-10-CM | POA: Diagnosis not present

## 2019-10-29 DIAGNOSIS — Z170 Estrogen receptor positive status [ER+]: Secondary | ICD-10-CM | POA: Diagnosis not present

## 2019-10-29 DIAGNOSIS — M8580 Other specified disorders of bone density and structure, unspecified site: Secondary | ICD-10-CM | POA: Diagnosis not present

## 2019-10-29 LAB — CBC
HCT: 36.2 % (ref 36.0–46.0)
Hemoglobin: 12.1 g/dL (ref 12.0–15.0)
MCH: 28.9 pg (ref 26.0–34.0)
MCHC: 33.4 g/dL (ref 30.0–36.0)
MCV: 86.6 fL (ref 80.0–100.0)
Platelets: 191 10*3/uL (ref 150–400)
RBC: 4.18 MIL/uL (ref 3.87–5.11)
RDW: 14.6 % (ref 11.5–15.5)
WBC: 5.9 10*3/uL (ref 4.0–10.5)
nRBC: 0 % (ref 0.0–0.2)

## 2019-10-30 ENCOUNTER — Ambulatory Visit
Admission: RE | Admit: 2019-10-30 | Discharge: 2019-10-30 | Disposition: A | Discharge status: Home / Self Care | Payer: Medicare Other | Source: Ambulatory Visit | Attending: Radiation Oncology | Admitting: Radiation Oncology

## 2019-10-30 DIAGNOSIS — C50411 Malignant neoplasm of upper-outer quadrant of right female breast: Secondary | ICD-10-CM | POA: Diagnosis not present

## 2019-10-30 DIAGNOSIS — Z170 Estrogen receptor positive status [ER+]: Secondary | ICD-10-CM | POA: Diagnosis not present

## 2019-10-30 DIAGNOSIS — Z510 Encounter for antineoplastic radiation therapy: Secondary | ICD-10-CM | POA: Diagnosis not present

## 2019-11-02 ENCOUNTER — Ambulatory Visit
Admission: RE | Admit: 2019-11-02 | Discharge: 2019-11-02 | Disposition: A | Discharge status: Home / Self Care | Payer: Medicare Other | Source: Ambulatory Visit | Attending: Radiation Oncology | Admitting: Radiation Oncology

## 2019-11-02 DIAGNOSIS — Z170 Estrogen receptor positive status [ER+]: Secondary | ICD-10-CM | POA: Diagnosis not present

## 2019-11-02 DIAGNOSIS — Z510 Encounter for antineoplastic radiation therapy: Secondary | ICD-10-CM | POA: Diagnosis not present

## 2019-11-02 DIAGNOSIS — C50411 Malignant neoplasm of upper-outer quadrant of right female breast: Secondary | ICD-10-CM | POA: Diagnosis not present

## 2019-11-03 ENCOUNTER — Ambulatory Visit
Admission: RE | Admit: 2019-11-03 | Discharge: 2019-11-03 | Disposition: A | Discharge status: Home / Self Care | Payer: Medicare Other | Source: Ambulatory Visit | Attending: Radiation Oncology | Admitting: Radiation Oncology

## 2019-11-03 DIAGNOSIS — Z170 Estrogen receptor positive status [ER+]: Secondary | ICD-10-CM | POA: Diagnosis not present

## 2019-11-03 DIAGNOSIS — Z510 Encounter for antineoplastic radiation therapy: Secondary | ICD-10-CM | POA: Diagnosis not present

## 2019-11-03 DIAGNOSIS — C50411 Malignant neoplasm of upper-outer quadrant of right female breast: Secondary | ICD-10-CM | POA: Diagnosis not present

## 2019-11-04 ENCOUNTER — Ambulatory Visit
Admission: RE | Admit: 2019-11-04 | Discharge: 2019-11-04 | Disposition: A | Discharge status: Home / Self Care | Payer: Medicare Other | Source: Ambulatory Visit | Attending: Radiation Oncology | Admitting: Radiation Oncology

## 2019-11-04 DIAGNOSIS — Z510 Encounter for antineoplastic radiation therapy: Secondary | ICD-10-CM | POA: Diagnosis not present

## 2019-11-04 DIAGNOSIS — C50411 Malignant neoplasm of upper-outer quadrant of right female breast: Secondary | ICD-10-CM | POA: Diagnosis not present

## 2019-11-04 DIAGNOSIS — Z170 Estrogen receptor positive status [ER+]: Secondary | ICD-10-CM | POA: Diagnosis not present

## 2019-11-05 ENCOUNTER — Ambulatory Visit
Admission: RE | Admit: 2019-11-05 | Discharge: 2019-11-05 | Disposition: A | Discharge status: Home / Self Care | Payer: Medicare Other | Source: Ambulatory Visit | Attending: Radiation Oncology | Admitting: Radiation Oncology

## 2019-11-05 DIAGNOSIS — Z170 Estrogen receptor positive status [ER+]: Secondary | ICD-10-CM | POA: Diagnosis not present

## 2019-11-05 DIAGNOSIS — Z510 Encounter for antineoplastic radiation therapy: Secondary | ICD-10-CM | POA: Diagnosis not present

## 2019-11-05 DIAGNOSIS — C50411 Malignant neoplasm of upper-outer quadrant of right female breast: Secondary | ICD-10-CM | POA: Diagnosis not present

## 2019-11-06 ENCOUNTER — Ambulatory Visit
Admission: RE | Admit: 2019-11-06 | Discharge: 2019-11-06 | Disposition: A | Discharge status: Home / Self Care | Payer: Medicare Other | Source: Ambulatory Visit | Attending: Radiation Oncology | Admitting: Radiation Oncology

## 2019-11-06 DIAGNOSIS — C50411 Malignant neoplasm of upper-outer quadrant of right female breast: Secondary | ICD-10-CM | POA: Diagnosis not present

## 2019-11-06 DIAGNOSIS — Z170 Estrogen receptor positive status [ER+]: Secondary | ICD-10-CM | POA: Diagnosis not present

## 2019-11-06 DIAGNOSIS — Z510 Encounter for antineoplastic radiation therapy: Secondary | ICD-10-CM | POA: Diagnosis not present

## 2019-11-09 ENCOUNTER — Ambulatory Visit
Admission: RE | Admit: 2019-11-09 | Discharge: 2019-11-09 | Disposition: A | Discharge status: Home / Self Care | Payer: Medicare Other | Source: Ambulatory Visit | Attending: Radiation Oncology | Admitting: Radiation Oncology

## 2019-11-09 DIAGNOSIS — Z510 Encounter for antineoplastic radiation therapy: Secondary | ICD-10-CM | POA: Insufficient documentation

## 2019-11-09 DIAGNOSIS — Z170 Estrogen receptor positive status [ER+]: Secondary | ICD-10-CM | POA: Insufficient documentation

## 2019-11-09 DIAGNOSIS — C50411 Malignant neoplasm of upper-outer quadrant of right female breast: Secondary | ICD-10-CM | POA: Diagnosis not present

## 2019-11-10 ENCOUNTER — Ambulatory Visit
Admission: RE | Admit: 2019-11-10 | Discharge: 2019-11-10 | Disposition: A | Discharge status: Home / Self Care | Payer: Medicare Other | Source: Ambulatory Visit | Attending: Radiation Oncology | Admitting: Radiation Oncology

## 2019-11-10 DIAGNOSIS — C50411 Malignant neoplasm of upper-outer quadrant of right female breast: Secondary | ICD-10-CM | POA: Diagnosis not present

## 2019-11-10 DIAGNOSIS — Z510 Encounter for antineoplastic radiation therapy: Secondary | ICD-10-CM | POA: Diagnosis not present

## 2019-11-10 DIAGNOSIS — Z170 Estrogen receptor positive status [ER+]: Secondary | ICD-10-CM | POA: Diagnosis not present

## 2019-11-11 ENCOUNTER — Ambulatory Visit
Admission: RE | Admit: 2019-11-11 | Discharge: 2019-11-11 | Disposition: A | Discharge status: Home / Self Care | Payer: Medicare Other | Source: Ambulatory Visit | Attending: Radiation Oncology | Admitting: Radiation Oncology

## 2019-11-11 DIAGNOSIS — I10 Essential (primary) hypertension: Secondary | ICD-10-CM | POA: Diagnosis not present

## 2019-11-11 DIAGNOSIS — R0789 Other chest pain: Secondary | ICD-10-CM | POA: Diagnosis not present

## 2019-11-11 DIAGNOSIS — R0602 Shortness of breath: Secondary | ICD-10-CM | POA: Diagnosis not present

## 2019-11-11 DIAGNOSIS — Z170 Estrogen receptor positive status [ER+]: Secondary | ICD-10-CM | POA: Diagnosis not present

## 2019-11-11 DIAGNOSIS — I519 Heart disease, unspecified: Secondary | ICD-10-CM | POA: Diagnosis not present

## 2019-11-11 DIAGNOSIS — C50411 Malignant neoplasm of upper-outer quadrant of right female breast: Secondary | ICD-10-CM | POA: Diagnosis not present

## 2019-11-11 DIAGNOSIS — Z510 Encounter for antineoplastic radiation therapy: Secondary | ICD-10-CM | POA: Diagnosis not present

## 2019-11-12 ENCOUNTER — Inpatient Hospital Stay: Payer: Medicare Other | Attending: Hematology and Oncology

## 2019-11-12 ENCOUNTER — Ambulatory Visit
Admission: RE | Admit: 2019-11-12 | Discharge: 2019-11-12 | Disposition: A | Discharge status: Home / Self Care | Payer: Medicare Other | Source: Ambulatory Visit | Attending: Radiation Oncology | Admitting: Radiation Oncology

## 2019-11-12 ENCOUNTER — Other Ambulatory Visit: Payer: Self-pay

## 2019-11-12 DIAGNOSIS — M8580 Other specified disorders of bone density and structure, unspecified site: Secondary | ICD-10-CM | POA: Diagnosis not present

## 2019-11-12 DIAGNOSIS — Z510 Encounter for antineoplastic radiation therapy: Secondary | ICD-10-CM | POA: Diagnosis not present

## 2019-11-12 DIAGNOSIS — C50411 Malignant neoplasm of upper-outer quadrant of right female breast: Secondary | ICD-10-CM | POA: Diagnosis not present

## 2019-11-12 DIAGNOSIS — I509 Heart failure, unspecified: Secondary | ICD-10-CM | POA: Diagnosis not present

## 2019-11-12 DIAGNOSIS — Z170 Estrogen receptor positive status [ER+]: Secondary | ICD-10-CM | POA: Insufficient documentation

## 2019-11-12 DIAGNOSIS — G4730 Sleep apnea, unspecified: Secondary | ICD-10-CM | POA: Diagnosis not present

## 2019-11-12 DIAGNOSIS — Z87891 Personal history of nicotine dependence: Secondary | ICD-10-CM | POA: Diagnosis not present

## 2019-11-12 DIAGNOSIS — Z79899 Other long term (current) drug therapy: Secondary | ICD-10-CM | POA: Insufficient documentation

## 2019-11-12 DIAGNOSIS — C50911 Malignant neoplasm of unspecified site of right female breast: Secondary | ICD-10-CM | POA: Diagnosis not present

## 2019-11-12 LAB — CBC
HCT: 37.1 % (ref 36.0–46.0)
Hemoglobin: 12.2 g/dL (ref 12.0–15.0)
MCH: 28.7 pg (ref 26.0–34.0)
MCHC: 32.9 g/dL (ref 30.0–36.0)
MCV: 87.3 fL (ref 80.0–100.0)
Platelets: 177 10*3/uL (ref 150–400)
RBC: 4.25 MIL/uL (ref 3.87–5.11)
RDW: 14.3 % (ref 11.5–15.5)
WBC: 7.8 10*3/uL (ref 4.0–10.5)
nRBC: 0 % (ref 0.0–0.2)

## 2019-11-13 ENCOUNTER — Ambulatory Visit
Admission: RE | Admit: 2019-11-13 | Discharge: 2019-11-13 | Disposition: A | Discharge status: Home / Self Care | Payer: Medicare Other | Source: Ambulatory Visit | Attending: Radiation Oncology | Admitting: Radiation Oncology

## 2019-11-13 DIAGNOSIS — Z510 Encounter for antineoplastic radiation therapy: Secondary | ICD-10-CM | POA: Diagnosis not present

## 2019-11-13 DIAGNOSIS — C50411 Malignant neoplasm of upper-outer quadrant of right female breast: Secondary | ICD-10-CM | POA: Diagnosis not present

## 2019-11-13 DIAGNOSIS — Z170 Estrogen receptor positive status [ER+]: Secondary | ICD-10-CM | POA: Diagnosis not present

## 2019-11-16 ENCOUNTER — Ambulatory Visit
Admission: RE | Admit: 2019-11-16 | Discharge: 2019-11-16 | Disposition: A | Discharge status: Home / Self Care | Payer: Medicare Other | Source: Ambulatory Visit | Attending: Radiation Oncology | Admitting: Radiation Oncology

## 2019-11-16 DIAGNOSIS — C50411 Malignant neoplasm of upper-outer quadrant of right female breast: Secondary | ICD-10-CM | POA: Diagnosis not present

## 2019-11-16 DIAGNOSIS — Z170 Estrogen receptor positive status [ER+]: Secondary | ICD-10-CM | POA: Diagnosis not present

## 2019-11-16 DIAGNOSIS — Z510 Encounter for antineoplastic radiation therapy: Secondary | ICD-10-CM | POA: Diagnosis not present

## 2019-11-17 ENCOUNTER — Ambulatory Visit
Admission: RE | Admit: 2019-11-17 | Discharge: 2019-11-17 | Disposition: A | Discharge status: Home / Self Care | Payer: Medicare Other | Source: Ambulatory Visit | Attending: Radiation Oncology | Admitting: Radiation Oncology

## 2019-11-17 DIAGNOSIS — Z510 Encounter for antineoplastic radiation therapy: Secondary | ICD-10-CM | POA: Diagnosis not present

## 2019-11-17 DIAGNOSIS — C50411 Malignant neoplasm of upper-outer quadrant of right female breast: Secondary | ICD-10-CM | POA: Diagnosis not present

## 2019-11-17 DIAGNOSIS — Z170 Estrogen receptor positive status [ER+]: Secondary | ICD-10-CM | POA: Diagnosis not present

## 2019-11-23 ENCOUNTER — Ambulatory Visit (INDEPENDENT_AMBULATORY_CARE_PROVIDER_SITE_OTHER): Payer: Medicare Other | Admitting: Nurse Practitioner

## 2019-11-23 ENCOUNTER — Encounter: Payer: Self-pay | Admitting: Nurse Practitioner

## 2019-11-23 ENCOUNTER — Other Ambulatory Visit: Payer: Self-pay

## 2019-11-23 VITALS — BP 117/71 | HR 67 | Temp 98.3°F | Ht 66.0 in | Wt 229.2 lb

## 2019-11-23 DIAGNOSIS — E782 Mixed hyperlipidemia: Secondary | ICD-10-CM

## 2019-11-23 DIAGNOSIS — E66812 Obesity, class 2: Secondary | ICD-10-CM

## 2019-11-23 DIAGNOSIS — Z6837 Body mass index (BMI) 37.0-37.9, adult: Secondary | ICD-10-CM

## 2019-11-23 DIAGNOSIS — G4733 Obstructive sleep apnea (adult) (pediatric): Secondary | ICD-10-CM

## 2019-11-23 DIAGNOSIS — Z0000 Encounter for general adult medical examination without abnormal findings: Secondary | ICD-10-CM

## 2019-11-23 DIAGNOSIS — R7301 Impaired fasting glucose: Secondary | ICD-10-CM

## 2019-11-23 DIAGNOSIS — Z170 Estrogen receptor positive status [ER+]: Secondary | ICD-10-CM | POA: Diagnosis not present

## 2019-11-23 DIAGNOSIS — C50411 Malignant neoplasm of upper-outer quadrant of right female breast: Secondary | ICD-10-CM | POA: Diagnosis not present

## 2019-11-23 DIAGNOSIS — K219 Gastro-esophageal reflux disease without esophagitis: Secondary | ICD-10-CM | POA: Diagnosis not present

## 2019-11-23 DIAGNOSIS — M8588 Other specified disorders of bone density and structure, other site: Secondary | ICD-10-CM

## 2019-11-23 DIAGNOSIS — I10 Essential (primary) hypertension: Secondary | ICD-10-CM | POA: Diagnosis not present

## 2019-11-23 DIAGNOSIS — E538 Deficiency of other specified B group vitamins: Secondary | ICD-10-CM | POA: Diagnosis not present

## 2019-11-23 NOTE — Assessment & Plan Note (Signed)
Chronic, ongoing.  Recent DEXA this year.  Continue daily supplements and recheck Vit D level today.

## 2019-11-23 NOTE — Assessment & Plan Note (Signed)
Chronic, stable with Protonix.  Continue current medication regimen.  Mag level next visit and annually.  Trial time off in future and assess if tolerated. 

## 2019-11-23 NOTE — Assessment & Plan Note (Signed)
A1C today for recheck. 

## 2019-11-23 NOTE — Assessment & Plan Note (Signed)
Recommend she utilize CPAP 100% if prescribed.

## 2019-11-23 NOTE — Progress Notes (Addendum)
BP 117/71   Pulse 67   Temp 98.3 F (36.8 C) (Oral)   Ht 5\' 6"  (1.676 m)   Wt 229 lb 3.2 oz (104 kg)   SpO2 95%   BMI 36.99 kg/m    Subjective:    Patient ID: Melissa Merritt, female    DOB: 03-17-1951, 69 y.o.   MRN: [id number]  HPI: Melissa Merritt is a 69 y.o. female presenting for visit.  She currently lives with: significant other Menopausal Symptoms: no   HYPERTENSION / HYPERLIPIDEMIA Taking Valsartan, Metoprolol, Amlodipine, ASA. Followed by Dr. Josefa Half, last saw in his office 11/11/19. Last echo 07/02/19 with normal LV function with LVEF 45-50% with mild reduced left ventricular function which is consistent with previous echos dating back to 2013.  Refuses statin therapy, even after at length discussion about risk reduction. Is taking red yeast rice.   Satisfied with current treatment? yes Duration of hypertension: chronic BP monitoring frequency: a few times a week BP range: 130/70's at home BP medication side effects: no Duration of hyperlipidemia: chronic Cholesterol medication side effects: no Cholesterol supplements: red yeast rice Medication compliance: good compliance Aspirin: yes Recent stressors: no Recurrent headaches: no Visual changes: no Palpitations: no Dyspnea: at baseline -- is using Anoro Chest pain: no Lower extremity edema: no Dizzy/lightheaded: no  The 10-year ASCVD risk score [name], et al., 2013) is: 14.3%   Values used to calculate the score:     Age: 2 years     Sex: Female     Is Non-Hispanic African American: No     Diabetic: No     Tobacco smoker: No     Systolic Blood Pressure: 132 mmHg     Is BP treated: Yes     HDL Cholesterol: 54 mg/dL     Total Cholesterol: 207 mg/dL   BREAST CANCER: Being followed by oncology and receiving radiation treatment.  Finished treatments this past Tuesday, continues to be followed by oncology every 3 months.    SLEEP APNEA Had sleep study several years ago, did have study recently in  February and is to have follow-up with machine.  Does not wear CPAP as of yet. Sleep apnea status: uncontrolled Duration: chronic Satisfied with current treatment?:  yes CPAP use:  no Last sleep study: several years ago Treatments attempted: none Wakes feeling refreshed:  no Daytime hypersomnolence:  yes Fatigue:  yes Insomnia:  yes Good sleep hygiene:  yes Difficulty falling asleep:  yes Difficulty staying asleep:  yes Snoring bothers bed partner:  yes Observed apnea by bed partner: yes Obesity:  yes Hypertension: yes  Pulmonary hypertension:  no Coronary artery disease:  no  GERD Continues on Protonix. GERD control status: stable  Satisfied with current treatment? yes Heartburn frequency: none Medication side effects: no  Medication compliance: stable Previous GERD medications: TUMS Antacid use frequency:  none Alleviatiating factors:  Protonix Aggravating factors: certain foods Dysphagia: no Odynophagia:  no Hematemesis: no Blood in stool: no EGD: yes  Depression Screen done today and results listed below:  Depression screen [facility] 2/9 11/23/2019 05/25/2019 05/19/2018 05/17/2017 05/23/2016  Decreased Interest 0 0 0 0 0  Down, Depressed, Hopeless 0 0 0 0 0  PHQ - 2 Score 0 0 0 0 0  Altered sleeping - - - - 0  Tired, decreased energy - - - - 1  Change in appetite - - - - 0  Feeling bad or failure about yourself  - - - - 0  Trouble concentrating - - - - 0  Moving slowly or fidgety/restless - - - - 0  Suicidal thoughts - - - - 0  PHQ-9 Score - - - - 1    The patient does not have a history of falls. I did not complete a risk assessment for falls. A plan of care for falls was not documented.   Past Medical History:  Past Medical History:  Diagnosis Date  . Benign tumor of lung   . Cancer [facility])    right breast  . CHF (congestive heart failure) ([place])   . Dyspnea   . GERD (gastroesophageal reflux disease)   . Hyperlipidemia   . Hypertension   . Insomnia   .  Osteopenia   . Plantar fasciitis   . PONV (postoperative nausea and vomiting)    vomiting 2 days after lung surgery  . S/P pneumonectomy    right lower lobe  . Urinary incontinence     Surgical History:  Past Surgical History:  Procedure Laterality Date  . BREAST BIOPSY Right 07/17/2019   Affirm bx-"Ribbon" clip-path pending  . bronchoscopy    . CATARACT EXTRACTION, BILATERAL     Left- 11/21/15, right- 12/23/15  . CHOLECYSTECTOMY    . CYSTECTOMY  11/03/09   cheek  . dilation and curretage    . EYE SURGERY Bilateral    cataract  . FOOT SURGERY Bilateral 2010  . LOBECTOMY    . LUNG SURGERY Right   . PART MASTECTOMY,RADIO FREQUENCY LOCALIZER,AXILLARY SENTINEL NODE BIOPSY Right 08/13/2019   Procedure: PART MASTECTOMY,RADIO FREQUENCY LOCALIZER,AXILLARY SENTINEL NODE BIOPSY;  Surgeon: Benjamine Sprague, DO;  Location: [facility] ORS;  Service: General;  Laterality: Right;  . UPPER GI ENDOSCOPY  2005    Medications:  Current Outpatient Medications on File Prior to Visit  Medication Sig  . amLODipine (NORVASC) 5 MG tablet Take 1 tablet (5 mg total) by mouth daily.  Marland Kitchen aspirin 81 MG tablet Take 81 mg by mouth daily.  . Biotin 5000 MCG TABS Take 5,000 mcg by mouth 2 (two) times daily.  . Calcium Carb-Cholecalciferol (CALCIUM-VITAMIN D) 600-400 MG-UNIT TABS Take 1 tablet by mouth 2 (two) times daily.  . cholecalciferol (VITAMIN D3) 25 MCG (1000 UNIT) tablet Take 1,000 Units by mouth in the morning and at bedtime.  . Cyanocobalamin (B-12) 2500 MCG TABS Take 2,500 mcg by mouth daily.  Marland Kitchen ibuprofen (IBU-200) 200 MG tablet Take 400 mg by mouth every 6 (six) hours as needed for moderate pain.   . metoprolol succinate (TOPROL-XL) 25 MG 24 hr tablet Take 1 tablet (25 mg total) by mouth daily.  . pantoprazole (PROTONIX) 40 MG tablet Take 1 tablet (40 mg total) by mouth 2 (two) times daily.  . Red Yeast Rice Extract (RED YEAST RICE PO) Take 1 tablet by mouth in the morning and at bedtime.  . St Johns Wort 300  MG CAPS Take 300 mg by mouth 2 (two) times daily.  . valsartan (DIOVAN) 160 MG tablet Take 1 tablet (160 mg total) by mouth daily.  Marland Kitchen HYDROcodone-acetaminophen (NORCO) 5-325 MG tablet Take 1 tablet by mouth every 6 (six) hours as needed for up to 6 doses for moderate pain. (Patient not taking: Reported on 11/23/2019)   No current facility-administered medications on file prior to visit.    Allergies:  Allergies  Allergen Reactions  . Contrast Media [Iodinated Diagnostic Agents] Swelling    Eye swelling shut  . Other Swelling    Eye swelling shut IV contrast media   .  Ace Inhibitors Other (See Comments)    "Makes me feel strange."  . Hctz [Hydrochlorothiazide] Other (See Comments)    arms ache  . Keflex [Cephalexin] Other (See Comments)    thrush    Social History:  Social History   Socioeconomic History  . Marital status: Married    Spouse name: Not on file  . Number of children: Not on file  . Years of education: Not on file  . Highest education level: Some college, no degree  Occupational History  . Occupation: retired   Tobacco Use  . Smoking status: Former Smoker    Quit date: 07/09/1979    Years since quitting: 40.4  . Smokeless tobacco: Never Used  Vaping Use  . Vaping Use: Never used  Substance and Sexual Activity  . Alcohol use: No  . Drug use: No  . Sexual activity: Yes  Other Topics Concern  . Not on file  Social History Narrative  . Not on file   Social Determinants of Health   Financial Resource Strain:   . Difficulty of Paying Living Expenses: Not on file  Food Insecurity:   . Worried About Charity fundraiser in the Last Year: Not on file  . Ran Out of Food in the Last Year: Not on file  Transportation Needs:   . Lack of Transportation (Medical): Not on file  . Lack of Transportation (Non-Medical): Not on file  Physical Activity:   . Days of Exercise per Week: Not on file  . Minutes of Exercise per Session: Not on file  Stress:   . Feeling of  Stress : Not on file  Social Connections:   . Frequency of Communication with Friends and Family: Not on file  . Frequency of Social Gatherings with Friends and Family: Not on file  . Attends Religious Services: Not on file  . Active Member of Clubs or Organizations: Not on file  . Attends Archivist Meetings: Not on file  . Marital Status: Not on file  Intimate Partner Violence:   . Fear of Current or Ex-Partner: Not on file  . Emotionally Abused: Not on file  . Physically Abused: Not on file  . Sexually Abused: Not on file   Social History   Tobacco Use  Smoking Status Former Smoker  . Quit date: 07/09/1979  . Years since quitting: 40.4  Smokeless Tobacco Never Used   Social History   Substance and Sexual Activity  Alcohol Use No    Family History:  Family History  Problem Relation Age of Onset  . COPD Mother   . Hypertension Mother   . Emphysema Mother   . Osteoporosis Mother   . Heart disease Father   . Hyperlipidemia Sister   . Hyperlipidemia Brother   . Heart disease Maternal Grandmother   . Heart disease Maternal Grandfather   . Heart disease Paternal Grandmother   . Stroke Paternal Grandmother   . Heart disease Paternal Grandfather   . Stroke Paternal Grandfather   . Cancer Maternal Aunt        breast and lung  . Osteoporosis Maternal Aunt   . Aortic aneurysm Maternal Aunt   . Breast cancer Maternal Aunt 76    Past medical history, surgical history, medications, allergies, family history and social history reviewed with patient today and changes made to appropriate areas of the chart.   Review of Systems - negative All other ROS negative except what is listed above and in the HPI.  Objective:    BP 117/71   Pulse 67   Temp 98.3 F (36.8 C) (Oral)   Ht 5\' 6"  (1.676 m)   Wt 229 lb 3.2 oz (104 kg)   SpO2 95%   BMI 36.99 kg/m   Wt Readings from Last 3 Encounters:  11/26/19 229 lb 8 oz (104.1 kg)  11/23/19 229 lb 3.2 oz (104 kg)   09/10/19 226 lb 9.6 oz (102.8 kg)    Physical Exam Vitals and nursing note reviewed.  Constitutional:      General: She is awake. She is not in acute distress.    Appearance: She is well-developed and well-groomed. She is obese. She is not ill-appearing.  HENT:     Head: Normocephalic and atraumatic.     Right Ear: Hearing, tympanic membrane, ear canal and external ear normal. No drainage.     Left Ear: Hearing, tympanic membrane, ear canal and external ear normal. No drainage.     Nose: Nose normal.     Right Sinus: No maxillary sinus tenderness or frontal sinus tenderness.     Left Sinus: No maxillary sinus tenderness or frontal sinus tenderness.     Mouth/Throat:     Mouth: Mucous membranes are moist.     Pharynx: Oropharynx is clear. Uvula midline. No pharyngeal swelling, oropharyngeal exudate or posterior oropharyngeal erythema.  Eyes:     General: Lids are normal.        Right eye: No discharge.        Left eye: No discharge.     Extraocular Movements: Extraocular movements intact.     Conjunctiva/sclera: Conjunctivae normal.     Pupils: Pupils are equal, round, and reactive to light.     Visual Fields: Right eye visual fields normal and left eye visual fields normal.  Neck:     Thyroid: No thyromegaly.     Vascular: No carotid bruit.     Trachea: Trachea normal.  Cardiovascular:     Rate and Rhythm: Normal rate and regular rhythm.     Heart sounds: Normal heart sounds. No murmur heard.  No gallop.   Pulmonary:     Effort: Pulmonary effort is normal. No accessory muscle usage or respiratory distress.     Breath sounds: Normal breath sounds.  Chest:     Comments: Deferred -- being followed by oncology Abdominal:     General: Bowel sounds are normal.     Palpations: Abdomen is soft. There is no hepatomegaly or splenomegaly.     Tenderness: There is no abdominal tenderness.  Musculoskeletal:        General: Normal range of motion.     Cervical back: Normal range of  motion and neck supple.     Right lower leg: No edema.     Left lower leg: No edema.  Lymphadenopathy:     Head:     Right side of head: No submental, submandibular, tonsillar, preauricular or posterior auricular adenopathy.     Left side of head: No submental, submandibular, tonsillar, preauricular or posterior auricular adenopathy.     Cervical: No cervical adenopathy.  Skin:    General: Skin is warm and dry.     Capillary Refill: Capillary refill takes less than 2 seconds.     Findings: No rash.  Neurological:     Mental Status: She is alert and oriented to person, place, and time.     Cranial Nerves: Cranial nerves are intact.     Gait: Gait is intact.  Deep Tendon Reflexes: Reflexes are normal and symmetric.     Reflex Scores:      Brachioradialis reflexes are 2+ on the right side and 2+ on the left side.      Patellar reflexes are 2+ on the right side and 2+ on the left side. Psychiatric:        Attention and Perception: Attention normal.        Mood and Affect: Mood normal.        Speech: Speech normal.        Behavior: Behavior normal. Behavior is cooperative.        Thought Content: Thought content normal.        Judgment: Judgment normal.     Results for orders placed or performed in visit on 11/23/19  Comprehensive metabolic panel  Result Value Ref Range   Glucose 103 (H) 65 - 99 mg/dL   BUN 18 8 - 27 mg/dL   Creatinine, Ser 0.81 0.57 - 1.00 mg/dL   GFR calc non Af Amer 74 >59 mL/min/1.73   GFR calc Af Amer 85 >59 mL/min/1.73   BUN/Creatinine Ratio 22 12 - 28   Sodium 140 134 - 144 mmol/L   Potassium 4.2 3.5 - 5.2 mmol/L   Chloride 101 96 - 106 mmol/L   CO2 25 20 - 29 mmol/L   Calcium 10.0 8.7 - 10.3 mg/dL   Total Protein 6.7 6.0 - 8.5 g/dL   Albumin 4.1 3.8 - 4.8 g/dL   Globulin, Total 2.6 1.5 - 4.5 g/dL   Albumin/Globulin Ratio 1.6 1.2 - 2.2   Bilirubin Total 0.3 0.0 - 1.2 mg/dL   Alkaline Phosphatase 111 48 - 121 IU/L   AST 17 0 - 40 IU/L   ALT 19 0  - 32 IU/L  Lipid Panel w/o Chol/HDL Ratio  Result Value Ref Range   Cholesterol, Total 207 (H) 100 - 199 mg/dL   Triglycerides 139 0 - 149 mg/dL   HDL 54 >39 mg/dL   VLDL Cholesterol Cal 25 5 - 40 mg/dL   LDL Chol Calc (NIH) 128 (H) 0 - 99 mg/dL  TSH  Result Value Ref Range   TSH 2.060 0.450 - 4.500 uIU/mL  Magnesium  Result Value Ref Range   Magnesium 2.2 1.6 - 2.3 mg/dL  VITAMIN D 25 Hydroxy (Vit-D Deficiency, Fractures)  Result Value Ref Range   Vit D, 25-Hydroxy 58.3 30.0 - 100.0 ng/mL  HgB A1c  Result Value Ref Range   Hgb A1c MFr Bld 5.6 4.8 - 5.6 %   Est. average glucose Bld gHb Est-mCnc 114 mg/dL  B12  Result Value Ref Range   Vitamin B-12 >2000 (H) 232 - 1245 pg/mL      Assessment & Plan:   Problem List Items Addressed This Visit      Cardiovascular and Mediastinum   Essential hypertension    Chronic, with BP at goal today in office and on home readings.  Continue current medication regimen and adjust as needed + collaboration with cardiology.  Continue to monitor BP at home regularly and document + focus on DASH diet.  CMP & TSH today.  Return in 6 months.      Relevant Orders   Comprehensive metabolic panel (Completed)   TSH (Completed)     Respiratory   Obstructive apnea    Recommend she utilize CPAP 100% if prescribed.        Digestive   GERD (gastroesophageal reflux disease)    Chronic, stable with  Protonix.  Continue current medication regimen.  Mag level next visit and annually.  Trial time off in future and assess if tolerated.      Relevant Orders   Magnesium (Completed)     Endocrine   IFG (impaired fasting glucose)    A1C today for recheck.      Relevant Orders   HgB A1c (Completed)     Musculoskeletal and Integument   Osteopenia of spine    Chronic, ongoing.  Recent DEXA this year.  Continue daily supplements and recheck Vit D level today.      Relevant Orders   VITAMIN D 25 Hydroxy (Vit-D Deficiency, Fractures) (Completed)      Other   Hyperlipidemia    Chronic, ongoing.  Again reviewed ASCVD score with patient and discussed risks, she refuses statin, does not wish to take.  Continue red yeast rice and obtain labs today.  Return in 6 months.      Relevant Orders   Lipid Panel w/o Chol/HDL Ratio (Completed)   Obesity    Recommended eating smaller high protein, low fat meals more frequently and exercising 30 mins a day 5 times a week with a goal of 10-15lb weight loss in the next 3 months. Patient voiced their understanding and motivation to adhere to these recommendations.       Malignant neoplasm of right breast in female, estrogen receptor positive ([place]) - Primary    Ongoing, continue collaboration with oncology.  Recent notes reviewed.       Other Visit Diagnoses    B12 deficiency       Reports history of this, takes daily supplement.  Will check level today.   Relevant Orders   B12 (Completed)       Follow up plan: Return in about 6 months (around 05/25/2020) for Breast CA, HTN/HLD, GERD.   LABORATORY TESTING:  - Pap smear: not applicable  IMMUNIZATIONS:   - Tdap: Tetanus vaccination status reviewed: last tetanus booster within 10 years. - Influenza: Up to date - Pneumovax: Up to date - Prevnar: Up to date - HPV: Not applicable - Zostavax vaccine: Up to date  SCREENING: -Mammogram: Up to date  - Colonoscopy: Refused  - Bone Density: Up to date  -- April 2021 osteopenia -Hearing Test: Not applicable  -Spirometry: Not applicable   PATIENT COUNSELING:   Advised to take 1 mg of folate supplement per day if capable of pregnancy.   Sexuality: Discussed sexually transmitted diseases, partner selection, use of condoms, avoidance of unintended pregnancy  and contraceptive alternatives.   Advised to avoid cigarette smoking.  I discussed with the patient that most people either abstain from alcohol or drink within safe limits (<=14/week and <=4 drinks/occasion for males, <=7/weeks and <= 3  drinks/occasion for females) and that the risk for alcohol disorders and other health effects rises proportionally with the number of drinks per week and how often a drinker exceeds daily limits.  Discussed cessation/primary prevention of drug use and availability of treatment for abuse.   Diet: Encouraged to adjust caloric intake to maintain  or achieve ideal body weight, to reduce intake of dietary saturated fat and total fat, to limit sodium intake by avoiding high sodium foods and not adding table salt, and to maintain adequate dietary potassium and calcium preferably from fresh fruits, vegetables, and low-fat dairy products.    stressed the importance of regular exercise  Injury prevention: Discussed safety belts, safety helmets, smoke detector, smoking near bedding or upholstery.   Dental  health: Discussed importance of regular tooth brushing, flossing, and dental visits.    NEXT PREVENTATIVE PHYSICAL DUE IN 1 YEAR. Return in about 6 months (around 05/25/2020) for Breast CA, HTN/HLD, GERD.

## 2019-11-23 NOTE — Patient Instructions (Signed)
Healthy Eating Following a healthy eating pattern may help you to achieve and maintain a healthy body weight, reduce the risk of chronic disease, and live a long and productive life. It is important to follow a healthy eating pattern at an appropriate calorie level for your body. Your nutritional needs should be met primarily through food by choosing a variety of nutrient-rich foods. What are tips for following this plan? Reading food labels  Read labels and choose the following: ? Reduced or low sodium. ? Juices with 100% fruit juice. ? Foods with low saturated fats and high polyunsaturated and monounsaturated fats. ? Foods with whole grains, such as whole wheat, cracked wheat, brown rice, and wild rice. ? Whole grains that are fortified with folic acid. This is recommended for women who are pregnant or who want to become pregnant.  Read labels and avoid the following: ? Foods with a lot of added sugars. These include foods that contain brown sugar, corn sweetener, corn syrup, dextrose, fructose, glucose, high-fructose corn syrup, honey, invert sugar, lactose, malt syrup, maltose, molasses, raw sugar, sucrose, trehalose, or turbinado sugar.  Do not eat more than the following amounts of added sugar per day:  6 teaspoons (25 g) for women.  9 teaspoons (38 g) for men. ? Foods that contain processed or refined starches and grains. ? Refined grain products, such as white flour, degermed cornmeal, white bread, and white rice. Shopping  Choose nutrient-rich snacks, such as vegetables, whole fruits, and nuts. Avoid high-calorie and high-sugar snacks, such as potato chips, fruit snacks, and candy.  Use oil-based dressings and spreads on foods instead of solid fats such as butter, stick margarine, or cream cheese.  Limit pre-made sauces, mixes, and "instant" products such as flavored rice, instant noodles, and ready-made pasta.  Try more plant-protein sources, such as tofu, tempeh, black beans,  edamame, lentils, nuts, and seeds.  Explore eating plans such as the Mediterranean diet or vegetarian diet. Cooking  Use oil to saut or stir-fry foods instead of solid fats such as butter, stick margarine, or lard.  Try baking, boiling, grilling, or broiling instead of frying.  Remove the fatty part of meats before cooking.  Steam vegetables in water or broth. Meal planning   At meals, imagine dividing your plate into fourths: ? One-half of your plate is fruits and vegetables. ? One-fourth of your plate is whole grains. ? One-fourth of your plate is protein, especially lean meats, poultry, eggs, tofu, beans, or nuts.  Include low-fat dairy as part of your daily diet. Lifestyle  Choose healthy options in all settings, including home, work, school, restaurants, or stores.  Prepare your food safely: ? Wash your hands after handling raw meats. ? Keep food preparation surfaces clean by regularly washing with hot, soapy water. ? Keep raw meats separate from ready-to-eat foods, such as fruits and vegetables. ? Cook seafood, meat, poultry, and eggs to the recommended internal temperature. ? Store foods at safe temperatures. In general:  Keep cold foods at 59F (4.4C) or below.  Keep hot foods at 159F (60C) or above.  Keep your freezer at [facility] (-17.8C) or below.  Foods are no longer safe to eat when they have been between the temperatures of 40-159F (4.4-60C) for more than 2 hours. What foods should I eat? Fruits Aim to eat 2 cup-equivalents of fresh, canned (in natural juice), or frozen fruits each day. Examples of 1 cup-equivalent of fruit include 1 small apple, 8 large strawberries, 1 cup canned fruit,  cup  dried fruit, or 1 cup 100% juice. Vegetables Aim to eat 2-3 cup-equivalents of fresh and frozen vegetables each day, including different varieties and colors. Examples of 1 cup-equivalent of vegetables include 2 medium carrots, 2 cups raw, leafy greens, 1 cup chopped  vegetable (raw or cooked), or 1 medium baked potato. Grains Aim to eat 6 ounce-equivalents of whole grains each day. Examples of 1 ounce-equivalent of grains include 1 slice of bread, 1 cup ready-to-eat cereal, 3 cups popcorn, or  cup cooked rice, pasta, or cereal. Meats and other proteins Aim to eat 5-6 ounce-equivalents of protein each day. Examples of 1 ounce-equivalent of protein include 1 egg, 1/2 cup nuts or seeds, or 1 tablespoon (16 g) peanut butter. A cut of meat or fish that is the size of a deck of cards is about 3-4 ounce-equivalents.  Of the protein you eat each week, try to have at least 8 ounces come from seafood. This includes salmon, trout, herring, and anchovies. Dairy Aim to eat 3 cup-equivalents of fat-free or low-fat dairy each day. Examples of 1 cup-equivalent of dairy include 1 cup (240 mL) milk, 8 ounces (250 g) yogurt, 1 ounces (44 g) natural cheese, or 1 cup (240 mL) fortified soy milk. Fats and oils  Aim for about 5 teaspoons (21 g) per day. Choose monounsaturated fats, such as canola and olive oils, avocados, peanut butter, and most nuts, or polyunsaturated fats, such as sunflower, corn, and soybean oils, walnuts, pine nuts, sesame seeds, sunflower seeds, and flaxseed. Beverages  Aim for six 8-oz glasses of water per day. Limit coffee to three to five 8-oz cups per day.  Limit caffeinated beverages that have added calories, such as soda and energy drinks.  Limit alcohol intake to no more than 1 drink a day for nonpregnant women and 2 drinks a day for men. One drink equals 12 oz of beer (355 mL), 5 oz of wine (148 mL), or 1 oz of hard liquor (44 mL). Seasoning and other foods  Avoid adding excess amounts of salt to your foods. Try flavoring foods with herbs and spices instead of salt.  Avoid adding sugar to foods.  Try using oil-based dressings, sauces, and spreads instead of solid fats. This information is based on general [place] nutrition guidelines. For more  information, visit [employer].es. Exact amounts may vary based on your nutrition needs. Summary  A healthy eating plan may help you to maintain a healthy weight, reduce the risk of chronic diseases, and stay active throughout your life.  Plan your meals. Make sure you eat the right portions of a variety of nutrient-rich foods.  Try baking, boiling, grilling, or broiling instead of frying.  Choose healthy options in all settings, including home, work, school, restaurants, or stores. This information is not intended to replace advice given to you by your health care provider. Make sure you discuss any questions you have with your health care provider. Document Revised: 07/08/2017 Document Reviewed: 07/08/2017 Elsevier Patient Education  [place].

## 2019-11-23 NOTE — Assessment & Plan Note (Signed)
Ongoing, continue collaboration with oncology.  Recent notes reviewed. 

## 2019-11-23 NOTE — Assessment & Plan Note (Signed)
Recommended eating smaller high protein, low fat meals more frequently and exercising 30 mins a day 5 times a week with a goal of 10-15lb weight loss in the next 3 months. Patient voiced their understanding and motivation to adhere to these recommendations.  

## 2019-11-23 NOTE — Assessment & Plan Note (Signed)
Chronic, ongoing.  Again reviewed ASCVD score with patient and discussed risks, she refuses statin, does not wish to take.  Continue red yeast rice and obtain labs today.  Return in 6 months. 

## 2019-11-23 NOTE — Assessment & Plan Note (Addendum)
Chronic, with BP at goal today in office and on home readings.  Continue current medication regimen and adjust as needed + collaboration with cardiology.  Continue to monitor BP at home regularly and document + focus on DASH diet.  CMP & TSH today.  Return in 6 months.

## 2019-11-24 LAB — COMPREHENSIVE METABOLIC PANEL
ALT: 19 IU/L (ref 0–32)
AST: 17 IU/L (ref 0–40)
Albumin/Globulin Ratio: 1.6 (ref 1.2–2.2)
Albumin: 4.1 g/dL (ref 3.8–4.8)
Alkaline Phosphatase: 111 IU/L (ref 48–121)
BUN/Creatinine Ratio: 22 (ref 12–28)
BUN: 18 mg/dL (ref 8–27)
Bilirubin Total: 0.3 mg/dL (ref 0.0–1.2)
CO2: 25 mmol/L (ref 20–29)
Calcium: 10 mg/dL (ref 8.7–10.3)
Chloride: 101 mmol/L (ref 96–106)
Creatinine, Ser: 0.81 mg/dL (ref 0.57–1.00)
GFR calc Af Amer: 85 mL/min/{1.73_m2} (ref >59)
GFR calc non Af Amer: 74 mL/min/{1.73_m2} (ref >59)
Globulin, Total: 2.6 g/dL (ref 1.5–4.5)
Glucose: 103 mg/dL — ABNORMAL HIGH (ref 65–99)
Potassium: 4.2 mmol/L (ref 3.5–5.2)
Sodium: 140 mmol/L (ref 134–144)
Total Protein: 6.7 g/dL (ref 6.0–8.5)

## 2019-11-24 LAB — LIPID PANEL W/O CHOL/HDL RATIO
Cholesterol, Total: 207 mg/dL — ABNORMAL HIGH (ref 100–199)
HDL: 54 mg/dL (ref >39)
LDL Chol Calc (NIH): 128 mg/dL — ABNORMAL HIGH (ref 0–99)
Triglycerides: 139 mg/dL (ref 0–149)
VLDL Cholesterol Cal: 25 mg/dL (ref 5–40)

## 2019-11-24 LAB — TSH: TSH: 2.06 u[IU]/mL (ref 0.450–4.500)

## 2019-11-24 LAB — VITAMIN B12: Vitamin B-12: >2000 pg/mL — ABNORMAL HIGH (ref 232–1245)

## 2019-11-24 LAB — HEMOGLOBIN A1C
Est. average glucose Bld gHb Est-mCnc: 114 mg/dL
Hgb A1c MFr Bld: 5.6 % (ref 4.8–5.6)

## 2019-11-24 LAB — MAGNESIUM: Magnesium: 2.2 mg/dL (ref 1.6–2.3)

## 2019-11-24 LAB — VITAMIN D 25 HYDROXY (VIT D DEFICIENCY, FRACTURES): Vit D, 25-Hydroxy: 58.3 ng/mL (ref 30.0–100.0)

## 2019-11-24 NOTE — Progress Notes (Signed)
Good morning, please let Melissa Merritt know her labs have returned and everything looks great with exception of continue elevation in cholesterol.  I do continue to recommend starting medication for this, but know she prefers diet and supplement focus.  We will recheck these next visit fasting.  Vitamin D is normal range and A1C shows no prediabetes or diabetes at this time.  Overall very good.  Have a wonderful day and will see her next visit!!

## 2019-11-25 NOTE — Progress Notes (Signed)
[facility]  [address], [address] [place], [place] [address] Phone: [phone]  Fax: [phone]   Clinic Day:  11/26/2019  Referring physician: Venita Lick, NP  Chief Complaint: Melissa Merritt is a 69 y.o. female with stage IA right breast cancer who is seen for initiation of endocrine therapy.  HPI: The patient was last seen in the medical oncology clinic on 09/01/2019. At that time, she was doing well.  Incisions were healing well.  We discussed endocrine therapy following completion of radiation.  She was seen by Dr Baruch Gouty on 09/10/2019.  Plan was for 5040 cGy in 28 fractions to her right breast and a 1400 cGy boost her scar.   She underwent radiation from 09/28/2019 to 11/05/2019. She underwent electron treatment from 11/06/2019 to 11/17/2019.   Labs on 10/15/2019 included hematocrit 35.9, hemoglobin 12.3, platelets 224,000, WBC 6,500. CMP was normal. CA 27.29 was 18.9.   During the interim, she was doing well. She reports tolerating radiation well. She notes breast pain and a fever/burning sensation daily. Pain resolves with Tylenol. She notes some breast redness s/p radiation. She notes the skin under her right arm is tough and peeling. This week she has has some bleeding and green/yellow discharge coming from her breast after they removed the markers from her breast. Area is now healing.  She covers those bleeding spots with band aids.   She has some shortness of breath on exertion. She sleeps with 1 pillow, but since surgery she has been sleeping in her recliner. The swelling in her legs is improving which she feels is related to being active these past few days.   The patient is most interested in tamoxifen.    Past Medical History:  Diagnosis Date  . Benign tumor of lung   . Cancer [facility])    right breast  . CHF (congestive heart failure) ([place])   . Dyspnea   . GERD (gastroesophageal reflux disease)   . Hyperlipidemia   .  Hypertension   . Insomnia   . Osteopenia   . Plantar fasciitis   . PONV (postoperative nausea and vomiting)    vomiting 2 days after lung surgery  . S/P pneumonectomy    right lower lobe  . Urinary incontinence     Past Surgical History:  Procedure Laterality Date  . BREAST BIOPSY Right 07/17/2019   Affirm bx-"Ribbon" clip-path pending  . bronchoscopy    . CATARACT EXTRACTION, BILATERAL     Left- 11/21/15, right- 12/23/15  . CHOLECYSTECTOMY    . CYSTECTOMY  11/03/09   cheek  . dilation and curretage    . EYE SURGERY Bilateral    cataract  . FOOT SURGERY Bilateral 2010  . LOBECTOMY    . LUNG SURGERY Right   . PART MASTECTOMY,RADIO FREQUENCY LOCALIZER,AXILLARY SENTINEL NODE BIOPSY Right 08/13/2019   Procedure: PART MASTECTOMY,RADIO FREQUENCY LOCALIZER,AXILLARY SENTINEL NODE BIOPSY;  Surgeon: Benjamine Sprague, DO;  Location: [facility] ORS;  Service: General;  Laterality: Right;  . UPPER GI ENDOSCOPY  2005    Family History  Problem Relation Age of Onset  . COPD Mother   . Hypertension Mother   . Emphysema Mother   . Osteoporosis Mother   . Heart disease Father   . Hyperlipidemia Sister   . Hyperlipidemia Brother   . Heart disease Maternal Grandmother   . Heart disease Maternal Grandfather   . Heart disease Paternal Grandmother   . Stroke Paternal Grandmother   . Heart disease Paternal Grandfather   .  Stroke Paternal Grandfather   . Cancer Maternal Aunt        breast and lung  . Osteoporosis Maternal Aunt   . Aortic aneurysm Maternal Aunt   . Breast cancer Maternal Aunt 76    Social History:  reports that she quit smoking about 40 years ago. She has never used smokeless tobacco. She reports that she does not drink alcohol and does not use drugs. She has not been exposed to radiations or toxins. She is retired. She used to work at [employer].  She lives in [place].  The patient is alone today.  Allergies:  Allergies  Allergen Reactions  . Contrast Media [Iodinated  Diagnostic Agents] Swelling    Eye swelling shut  . Other Swelling    Eye swelling shut IV contrast media   . Ace Inhibitors Other (See Comments)    "Makes me feel strange."  . Hctz [Hydrochlorothiazide] Other (See Comments)    arms ache  . Keflex [Cephalexin] Other (See Comments)    thrush    Current Medications: Current Outpatient Medications  Medication Sig Dispense Refill  . amLODipine (NORVASC) 5 MG tablet Take 1 tablet (5 mg total) by mouth daily. 90 tablet 3  . aspirin 81 MG tablet Take 81 mg by mouth daily.    . Biotin 5000 MCG TABS Take 5,000 mcg by mouth 2 (two) times daily.    . Calcium Carb-Cholecalciferol (CALCIUM-VITAMIN D) 600-400 MG-UNIT TABS Take 1 tablet by mouth 2 (two) times daily.    . cholecalciferol (VITAMIN D3) 25 MCG (1000 UNIT) tablet Take 1,000 Units by mouth in the morning and at bedtime.    . Cyanocobalamin (B-12) 2500 MCG TABS Take 2,500 mcg by mouth daily.    Marland Kitchen ibuprofen (IBU-200) 200 MG tablet Take 400 mg by mouth every 6 (six) hours as needed for moderate pain.     . metoprolol succinate (TOPROL-XL) 25 MG 24 hr tablet Take 1 tablet (25 mg total) by mouth daily. 90 tablet 3  . pantoprazole (PROTONIX) 40 MG tablet Take 1 tablet (40 mg total) by mouth 2 (two) times daily. 180 tablet 3  . Red Yeast Rice Extract (RED YEAST RICE PO) Take 1 tablet by mouth in the morning and at bedtime.    . St Johns Wort 300 MG CAPS Take 300 mg by mouth 2 (two) times daily.    . valsartan (DIOVAN) 160 MG tablet Take 1 tablet (160 mg total) by mouth daily. 90 tablet 3  . HYDROcodone-acetaminophen (NORCO) 5-325 MG tablet Take 1 tablet by mouth every 6 (six) hours as needed for up to 6 doses for moderate pain. (Patient not taking: Reported on 11/23/2019) 6 tablet 0   No current facility-administered medications for this visit.    Review of Systems  Constitutional: Negative for chills, diaphoresis, fever, malaise/fatigue and weight loss (up 2 lbs).       Doing well.   HENT:  Negative for congestion, ear discharge, ear pain, hearing loss, nosebleeds, sinus pain, sore throat and tinnitus.   Eyes: Negative for blurred vision.  Respiratory: Positive for shortness of breath (on exertion). Negative for cough, hemoptysis, sputum production and wheezing.        Sleep apnea.  Cardiovascular: Positive for chest pain (right breast tenderness with burning sensation) and leg swelling (when she travels, improving). Negative for palpitations and orthopnea (1 pillow).  Gastrointestinal: Negative for abdominal pain, blood in stool, constipation, diarrhea, heartburn, melena, nausea and vomiting.  Genitourinary: Negative for dysuria, flank  pain, frequency, hematuria and urgency.       Incontinence s/p prolapsed uterus.  Musculoskeletal: Negative for back pain, joint pain, myalgias and neck pain.  Skin: Negative for itching and rash.       Right breast redness s/p radiation. Right underarm skin feels tough and peeling. Using band aids for bleeding after markers were removed.  Neurological: Negative for dizziness, tingling, sensory change, weakness and headaches.  Endo/Heme/Allergies: Does not bruise/bleed easily.  Psychiatric/Behavioral: Negative for depression and memory loss. The patient is not nervous/anxious and does not have insomnia.   All other systems reviewed and are negative.  Performance status (ECOG):1  Vitals Blood pressure 129/60, pulse 74, temperature 97.8 F (36.6 C), resp. rate 20, weight 229 lb 8 oz (104.1 kg), SpO2 96 %.   Physical Exam Vitals and nursing note reviewed.  Constitutional:      Appearance: Normal appearance. She is normal weight.  HENT:     Head: Normocephalic and atraumatic.     Comments: Short styled brown and blonde hair.  Mask.     Mouth/Throat:     Mouth: Mucous membranes are moist.     Pharynx: Oropharynx is clear.  Eyes:     General: No scleral icterus.    Extraocular Movements: Extraocular movements intact.     Conjunctiva/sclera:  Conjunctivae normal.     Pupils: Pupils are equal, round, and reactive to light.     Comments: Glasses.  Cardiovascular:     Rate and Rhythm: Normal rate and regular rhythm.     Pulses: Normal pulses.     Heart sounds: Normal heart sounds. No murmur heard.   Pulmonary:     Effort: Pulmonary effort is normal. No respiratory distress.     Breath sounds: Normal breath sounds. No wheezing or rales.  Chest:     Chest wall: No tenderness.     Breasts:        Right: Skin change (burn/redness under breast laterally s/p radiation) and tenderness present. No swelling or nipple discharge.        Left: No swelling, nipple discharge, skin change or tenderness.     Comments: Right breast skin breakdown s/p radiation. Abdominal:     General: Bowel sounds are normal. There is no distension.     Palpations: Abdomen is soft. There is no hepatomegaly, splenomegaly or mass.     Tenderness: There is no abdominal tenderness. There is no guarding.  Musculoskeletal:        General: No swelling or tenderness. Normal range of motion.     Cervical back: Normal range of motion and neck supple.  Lymphadenopathy:     Head:     Right side of head: No preauricular, posterior auricular or occipital adenopathy.     Left side of head: No preauricular, posterior auricular or occipital adenopathy.     Cervical: No cervical adenopathy.     Right cervical: No superficial cervical adenopathy.    Left cervical: No superficial cervical adenopathy.     Upper Body:     Right upper body: No supraclavicular or axillary adenopathy.     Left upper body: No supraclavicular or axillary adenopathy.     Lower Body: No right inguinal adenopathy. No left inguinal adenopathy.  Skin:    General: Skin is warm and dry.  Neurological:     Mental Status: She is oriented to person, place, and time. Mental status is at baseline.  Psychiatric:        Mood and Affect:  Mood normal.        Behavior: Behavior normal.        Thought Content:  Thought content normal.        Judgment: Judgment normal.    No visits with results within 3 Day(s) from this visit.  Latest known visit with results is:  Office Visit on 11/23/2019  Component Date Value Ref Range Status  . Glucose 11/23/2019 103* 65 - 99 mg/dL Final  . BUN 11/23/2019 18  8 - 27 mg/dL Final  . Creatinine, Ser 11/23/2019 0.81  0.57 - 1.00 mg/dL Final  . GFR calc non Af Amer 11/23/2019 74  >59 mL/min/1.73 Final  . GFR calc Af Amer 11/23/2019 85  >59 mL/min/1.73 Final   Comment: **[facility] currently reports eGFR in compliance with the current**   recommendations of the [employer]. [facility] will   update reporting as new guidelines are published from the NKF-ASN   Task force.   . BUN/Creatinine Ratio 11/23/2019 22  12 - 28 Final  . Sodium 11/23/2019 140  134 - 144 mmol/L Final  . Potassium 11/23/2019 4.2  3.5 - 5.2 mmol/L Final  . Chloride 11/23/2019 101  96 - 106 mmol/L Final  . CO2 11/23/2019 25  20 - 29 mmol/L Final  . Calcium 11/23/2019 10.0  8.7 - 10.3 mg/dL Final  . Total Protein 11/23/2019 6.7  6.0 - 8.5 g/dL Final  . Albumin 11/23/2019 4.1  3.8 - 4.8 g/dL Final  . Globulin, Total 11/23/2019 2.6  1.5 - 4.5 g/dL Final  . Albumin/Globulin Ratio 11/23/2019 1.6  1.2 - 2.2 Final  . Bilirubin Total 11/23/2019 0.3  0.0 - 1.2 mg/dL Final  . Alkaline Phosphatase 11/23/2019 111  48 - 121 IU/L Final  . AST 11/23/2019 17  0 - 40 IU/L Final  . ALT 11/23/2019 19  0 - 32 IU/L Final  . Cholesterol, Total 11/23/2019 207* 100 - 199 mg/dL Final  . Triglycerides 11/23/2019 139  0 - 149 mg/dL Final  . HDL 11/23/2019 54  >39 mg/dL Final  . VLDL Cholesterol Cal 11/23/2019 25  5 - 40 mg/dL Final  . LDL Chol Calc (NIH) 11/23/2019 128* 0 - 99 mg/dL Final  . TSH 11/23/2019 2.060  0.450 - 4.500 uIU/mL Final  . Magnesium 11/23/2019 2.2  1.6 - 2.3 mg/dL Final  . Vit D, 25-Hydroxy 11/23/2019 58.3  30.0 - 100.0 ng/mL Final   Comment: Vitamin D deficiency has been defined by  the Institute of Medicine and an Endocrine Society practice guideline as a level of serum 25-OH vitamin D less than 20 ng/mL (1,2). The Endocrine Society went on to further define vitamin D insufficiency as a level between 21 and 29 ng/mL (2). 1. IOM (Institute of Medicine). 2010. Dietary reference    intakes for calcium and D. [place]: The    [employer]. 2. Holick MF, Binkley [place], Bischoff-Ferrari HA, et al.    Evaluation, treatment, and prevention of vitamin D    deficiency: an Endocrine Society clinical practice    guideline. JCEM. 2011 Jul; 96(7):1911-30.   Marland Kitchen Hgb A1c MFr Bld 11/23/2019 5.6  4.8 - 5.6 % Final   Comment:          Prediabetes: 5.7 - 6.4          Diabetes: >6.4          Glycemic control for adults with diabetes: <7.0   . Est. average glucose Bld gHb Est-m* 11/23/2019 114  mg/dL  Final  . Vitamin B-12 11/23/2019 >2000* 232 - 1245 pg/mL Final    Assessment:  Melissa Merritt is a 69 y.o. female  with stage IA right breast cancer s/p partial mastectomy and right axillary sentinel lymph node biopsy on 08/13/2019.   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  There was atypical lobular hyperplasia, with ductal involvement.  Additional posterior margin revealed no evidence of residual invasive carcinoma or DCIS.  Closest margin for invasive carcinoma was > 10 mm and 2 mm for DCIS.  One lymph node was negative for malignancy.  Tumor was ER + (91-100%), PR+ (91-100%), and Her2/neu -.  Pathologic stage was pT1a pN0 (sn).  She underwent stereotactic biopsy on 07/17/2019.  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    She received 5040 cGy to her right breast and a 1400 cGy boost her scar from 09/28/2019- 11/17/2019.   Bilateral diagnostic mammogram on 07/08/2019 revealed persistent architectural  distortion involving the UPPER OUTER QUADRANT of the RIGHT breast without sonographic correlate.  There was no pathologic RIGHT axillary lymphadenopathy.  There was no mammographic or sonographic evidence of malignancy involving the LEFT breast.    Bone density on 08/04/2019 revealed osteopenia with a T score of -1.5 in the AP spine L2-L4.  She has CHF and sleep apnea.  Echo on 07/02/2019 revealed an EF of 45%.  She is scheduled to have a root canal and a crown.  She received her last Moderna COVID-19 vaccine at the end or 06/2019.   Symptomatically, she is doing well.  She had some slight skin breakdown with radiation.  Exam reveals healing skin without mass.  Plan: 1.   Review labs from 11/12/2019 and 11/23/2019. 2.   Stage IA right breast cancer             She is s/p lumpectomy and SLN biopsy.             Pathology revealed a 4 mm grade 1 tumor.                         Tumor is ER and PR positive and HER-2/neu negative.             Patient completed radiation therapy on 11/17/2019.             Review plan for initiation of endocrine therapy.                         Discuss options of tamoxifen vs aromatase inhibitors were reviewed                         Potential side effects were discussed  Patient wishes to initiate tamoxifen.  Rx: tamoxifen 20 mg p.o. daily (dispense #30 with 1 refill). 3.   Osteopenia             Encourage calcium, vitamin D and exercise. 4.   RTC in 1 month for MD assessment and labs (LFTs, CA27.29).  I discussed the assessment and treatment plan with the patient.  The patient was provided an opportunity to ask questions and all were answered.  The patient agreed with the plan and demonstrated an understanding of the instructions.  The patient was advised to call back if the symptoms worsen  or if the condition fails to improve as anticipated.  I provided 30 minutes of face-to-face time during this this encounter and > 50% was spent counseling as documented  under my assessment and plan.    Lequita Asal, MD, PhD    11/26/2019, 2:49 PM  I, Selena Batten, am acting as scribe for [employer]. Mike Gip, MD, PhD.  I, Melissa C. Mike Gip, MD, have reviewed the above documentation for accuracy and completeness, and I agree with the above.

## 2019-11-26 ENCOUNTER — Other Ambulatory Visit: Payer: Self-pay

## 2019-11-26 ENCOUNTER — Inpatient Hospital Stay (HOSPITAL_BASED_OUTPATIENT_CLINIC_OR_DEPARTMENT_OTHER): Payer: Medicare Other | Admitting: Hematology and Oncology

## 2019-11-26 ENCOUNTER — Encounter: Payer: Self-pay | Admitting: Hematology and Oncology

## 2019-11-26 VITALS — BP 129/60 | HR 74 | Temp 97.8°F | Resp 20 | Wt 229.5 lb

## 2019-11-26 DIAGNOSIS — M8580 Other specified disorders of bone density and structure, unspecified site: Secondary | ICD-10-CM | POA: Diagnosis not present

## 2019-11-26 DIAGNOSIS — C50411 Malignant neoplasm of upper-outer quadrant of right female breast: Secondary | ICD-10-CM | POA: Diagnosis not present

## 2019-11-26 DIAGNOSIS — Z170 Estrogen receptor positive status [ER+]: Secondary | ICD-10-CM

## 2019-11-26 DIAGNOSIS — G4730 Sleep apnea, unspecified: Secondary | ICD-10-CM | POA: Diagnosis not present

## 2019-11-26 DIAGNOSIS — M8588 Other specified disorders of bone density and structure, other site: Secondary | ICD-10-CM | POA: Diagnosis not present

## 2019-11-26 DIAGNOSIS — I509 Heart failure, unspecified: Secondary | ICD-10-CM | POA: Diagnosis not present

## 2019-11-26 DIAGNOSIS — Z87891 Personal history of nicotine dependence: Secondary | ICD-10-CM | POA: Diagnosis not present

## 2019-11-26 DIAGNOSIS — C50911 Malignant neoplasm of unspecified site of right female breast: Secondary | ICD-10-CM | POA: Diagnosis not present

## 2019-11-26 MED ORDER — TAMOXIFEN CITRATE 20 MG PO TABS: 20.0000 mg | ORAL_TABLET | Freq: Every day | ORAL | 1 refills | Status: DC

## 2019-12-09 NOTE — Addendum Note (Signed)
Addended by: Marnee Guarneri T on: 12/09/2019 07:50 AM   Modules accepted: Level of Service

## 2019-12-16 DIAGNOSIS — R0602 Shortness of breath: Secondary | ICD-10-CM | POA: Diagnosis not present

## 2019-12-16 DIAGNOSIS — Z01818 Encounter for other preprocedural examination: Secondary | ICD-10-CM | POA: Diagnosis not present

## 2019-12-23 DIAGNOSIS — J4530 Mild persistent asthma, uncomplicated: Secondary | ICD-10-CM | POA: Diagnosis not present

## 2019-12-27 NOTE — Progress Notes (Signed)
[facility]  [address] [place], [place] [address] Phone: [phone]  Fax: [phone]   Clinic Day:  12/28/2019  Referring physician: Venita Lick, NP  Chief Complaint: Melissa Merritt is a 69 y.o. female with stage IA right breast cancer who is seen for a one month assessment after initiation of tamoxifen.   HPI: The patient was last seen in the medical oncology clinic on 11/26/2019. At that time, she was doing well.  She had some slight skin breakdown with radiation.  Exam revealed healing skin without mass.  She began tamoxifen.  During the interim, she has been well. She notes that her skin breakdown from radiation has healed completely. She has been taking her tamoxifen without major difficulties; she does note more hot flashes than she had prior to taking the tamoxifen, but it does not interfere with her quality of life. She has no other side effects. She declines any missed doses.   She states that she has been drinking around 10 Pepsi drinks per day. She doesn't want to drink diet ones due to the aspartame.   She continues to have issues with her prolapsed uterus and notes that she has issues with it falling.    Past Medical History:  Diagnosis Date  . Benign tumor of lung   . Cancer [facility])    right breast  . CHF (congestive heart failure) ([place])   . Dyspnea   . GERD (gastroesophageal reflux disease)   . Hyperlipidemia   . Hypertension   . Insomnia   . Osteopenia   . Plantar fasciitis   . PONV (postoperative nausea and vomiting)    vomiting 2 days after lung surgery  . S/P pneumonectomy    right lower lobe  . Urinary incontinence     Past Surgical History:  Procedure Laterality Date  . BREAST BIOPSY Right 07/17/2019   Affirm bx-"Ribbon" clip-path pending  . bronchoscopy    . CATARACT EXTRACTION, BILATERAL     Left- 11/21/15, right- 12/23/15  . CHOLECYSTECTOMY    . CYSTECTOMY  11/03/09   cheek  . dilation and  curretage    . EYE SURGERY Bilateral    cataract  . FOOT SURGERY Bilateral 2010  . LOBECTOMY    . LUNG SURGERY Right   . PART MASTECTOMY,RADIO FREQUENCY LOCALIZER,AXILLARY SENTINEL NODE BIOPSY Right 08/13/2019   Procedure: PART MASTECTOMY,RADIO FREQUENCY LOCALIZER,AXILLARY SENTINEL NODE BIOPSY;  Surgeon: Benjamine Sprague, DO;  Location: [facility] ORS;  Service: General;  Laterality: Right;  . UPPER GI ENDOSCOPY  2005    Family History  Problem Relation Age of Onset  . COPD Mother   . Hypertension Mother   . Emphysema Mother   . Osteoporosis Mother   . Heart disease Father   . Hyperlipidemia Sister   . Hyperlipidemia Brother   . Heart disease Maternal Grandmother   . Heart disease Maternal Grandfather   . Heart disease Paternal Grandmother   . Stroke Paternal Grandmother   . Heart disease Paternal Grandfather   . Stroke Paternal Grandfather   . Cancer Maternal Aunt        breast and lung  . Osteoporosis Maternal Aunt   . Aortic aneurysm Maternal Aunt   . Breast cancer Maternal Aunt 76    Social History:  reports that she quit smoking about 40 years ago. She has never used smokeless tobacco. She reports that she does not drink alcohol and does not use drugs. She has not been exposed to  radiations or toxins. She is retired. She used to work at [employer].    Her husband is status post CVA.  She lives in [place]. The patient is alone today.   Allergies:  Allergies  Allergen Reactions  . Contrast Media [Iodinated Diagnostic Agents] Swelling    Eye swelling shut  . Other Swelling    Eye swelling shut IV contrast media   . Ace Inhibitors Other (See Comments)    "Makes me feel strange."  . Hctz [Hydrochlorothiazide] Other (See Comments)    arms ache  . Keflex [Cephalexin] Other (See Comments)    thrush    Current Medications: Current Outpatient Medications  Medication Sig Dispense Refill  . amLODipine (NORVASC) 5 MG tablet Take 1 tablet (5 mg total) by mouth daily. 90 tablet 3   . aspirin 81 MG tablet Take 81 mg by mouth daily.    . Biotin 5000 MCG TABS Take 5,000 mcg by mouth 2 (two) times daily.    . Calcium Carb-Cholecalciferol (CALCIUM-VITAMIN D) 600-400 MG-UNIT TABS Take 1 tablet by mouth 2 (two) times daily.    . cholecalciferol (VITAMIN D3) 25 MCG (1000 UNIT) tablet Take 1,000 Units by mouth in the morning and at bedtime.    . Cyanocobalamin (B-12) 2500 MCG TABS Take 2,500 mcg by mouth daily.    Marland Kitchen ibuprofen (IBU-200) 200 MG tablet Take 400 mg by mouth every 6 (six) hours as needed for moderate pain.     . metoprolol succinate (TOPROL-XL) 25 MG 24 hr tablet Take 1 tablet (25 mg total) by mouth daily. 90 tablet 3  . pantoprazole (PROTONIX) 40 MG tablet Take 1 tablet (40 mg total) by mouth 2 (two) times daily. 180 tablet 3  . Red Yeast Rice Extract (RED YEAST RICE PO) Take 1 tablet by mouth in the morning and at bedtime.    . St Johns Wort 300 MG CAPS Take 300 mg by mouth 2 (two) times daily.    . tamoxifen (NOLVADEX) 20 MG tablet Take 1 tablet (20 mg total) by mouth daily. 90 tablet 1  . valsartan (DIOVAN) 160 MG tablet Take 1 tablet (160 mg total) by mouth daily. 90 tablet 3  . HYDROcodone-acetaminophen (NORCO) 5-325 MG tablet Take 1 tablet by mouth every 6 (six) hours as needed for up to 6 doses for moderate pain. (Patient not taking: Reported on 11/23/2019) 6 tablet 0   No current facility-administered medications for this visit.    Review of Systems  Constitutional: Positive for weight loss (down 2 lbs; intentional - goal 175 lbs). Negative for chills, diaphoresis, fever and malaise/fatigue.       Feels "alright".  HENT: Negative for congestion, ear discharge, ear pain, nosebleeds, sinus pain, sore throat and tinnitus.   Eyes: Negative for blurred vision.  Respiratory: Positive for shortness of breath (on exertion). Negative for cough, hemoptysis, sputum production and wheezing.        Sleep apnea.  Cardiovascular: Positive for chest pain (right breast  tenderness) and leg swelling (improved). Negative for palpitations and orthopnea (1 pillow).  Gastrointestinal: Negative for abdominal pain, blood in stool, constipation, diarrhea, heartburn, melena, nausea and vomiting.  Genitourinary: Negative for dysuria, flank pain, frequency, hematuria and urgency.       Incontinence s/p prolapsed uterus.  Musculoskeletal: Negative for back pain, joint pain, myalgias and neck pain.  Skin: Negative for itching and rash.       Right breast redness s/p radiation.  Neurological: Negative for dizziness, tingling, sensory change,  weakness and headaches.  Endo/Heme/Allergies: Does not bruise/bleed easily.  Psychiatric/Behavioral: Negative for depression and memory loss. The patient is not nervous/anxious and does not have insomnia.   All other systems reviewed and are negative.  Performance status (ECOG):1  Vitals Blood pressure (!) 144/83, pulse 72, temperature (!) 97.4 F (36.3 C), temperature source Tympanic, weight 227 lb 15.3 oz (103.4 kg), SpO2 98 %.   Physical Exam Vitals and nursing note reviewed.  Constitutional:      Appearance: Normal appearance. She is normal weight.  HENT:     Head: Normocephalic and atraumatic.     Comments: Short styled brown and blonde hair.  Mask.     Mouth/Throat:     Mouth: Mucous membranes are moist.     Pharynx: Oropharynx is clear.  Eyes:     General: No scleral icterus.    Extraocular Movements: Extraocular movements intact.     Conjunctiva/sclera: Conjunctivae normal.     Pupils: Pupils are equal, round, and reactive to light.     Comments: Glasses.  Cardiovascular:     Rate and Rhythm: Normal rate and regular rhythm.     Pulses: Normal pulses.     Heart sounds: Normal heart sounds. No murmur heard.   Pulmonary:     Effort: Pulmonary effort is normal. No respiratory distress.     Breath sounds: Normal breath sounds. No wheezing or rales.  Chest:     Chest wall: No tenderness.  Abdominal:     General:  Bowel sounds are normal. There is no distension.     Palpations: Abdomen is soft. There is no hepatomegaly, splenomegaly or mass.     Tenderness: There is no abdominal tenderness. There is no guarding.  Musculoskeletal:        General: No swelling or tenderness. Normal range of motion.     Cervical back: Normal range of motion and neck supple.  Lymphadenopathy:     Head:     Right side of head: No preauricular, posterior auricular or occipital adenopathy.     Left side of head: No preauricular, posterior auricular or occipital adenopathy.     Cervical: No cervical adenopathy.     Right cervical: No superficial cervical adenopathy.    Left cervical: No superficial cervical adenopathy.     Upper Body:     Right upper body: No supraclavicular or axillary adenopathy.     Left upper body: No supraclavicular or axillary adenopathy.     Lower Body: No right inguinal adenopathy. No left inguinal adenopathy.  Skin:    General: Skin is warm and dry.  Neurological:     Mental Status: She is oriented to person, place, and time. Mental status is at baseline.  Psychiatric:        Mood and Affect: Mood normal.        Behavior: Behavior normal.        Thought Content: Thought content normal.        Judgment: Judgment normal.    Appointment on 12/28/2019  Component Date Value Ref Range Status  . Total Protein 12/28/2019 7.2  6.5 - 8.1 g/dL Final  . Albumin 12/28/2019 4.1  3.5 - 5.0 g/dL Final  . AST 12/28/2019 19  15 - 41 U/L Final  . ALT 12/28/2019 14  0 - 44 U/L Final  . Alkaline Phosphatase 12/28/2019 73  38 - 126 U/L Final  . Total Bilirubin 12/28/2019 0.6  0.3 - 1.2 mg/dL Final  . Bilirubin, Direct 12/28/2019 <0.1  0.0 - 0.2 mg/dL Final  . Indirect Bilirubin 12/28/2019 NOT CALCULATED  0.3 - 0.9 mg/dL Final   Performed at [facility], [address] [place], [place] [address]  . CA 27.29 12/28/2019 19.7  0.0 - 38.6 U/mL Final   Comment: (NOTE) Siemens Centaur  Immunochemiluminometric Methodology [facility]) Values obtained with different assay methods or kits cannot be used interchangeably. Results cannot be interpreted as absolute evidence of the presence or absence of malignant disease. Performed At: [facility] [address] [place], [place] [id number] Rush Farmer MD GY:[id number]     Assessment:  Melissa Merritt is a 69 y.o. female  with stage IA right breast cancer s/p partial mastectomy and right axillary sentinel lymph node biopsy on 08/13/2019.   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  There was atypical lobular hyperplasia, with ductal involvement.  Additional posterior margin revealed no evidence of residual invasive carcinoma or DCIS.  Closest margin for invasive carcinoma was > 10 mm and 2 mm for DCIS.  One lymph node was negative for malignancy.  Tumor was ER + (91-100%), PR+ (91-100%), and Her2/neu -.  Pathologic stage was pT1a pN0 (sn).  She underwent stereotactic biopsy on 07/17/2019.  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    She received 5040 cGy to her right breast and a 1400 cGy boost her scar from 09/28/2019- 11/17/2019.  She began tamoxifen on 11/27/2019.  Bilateral diagnostic mammogram on 07/08/2019 revealed persistent architectural distortion involving the UPPER OUTER QUADRANT of the RIGHT breast without sonographic correlate.  There was no pathologic RIGHT axillary lymphadenopathy.  There was no mammographic or sonographic evidence of malignancy involving the LEFT breast.    Bone density on 08/04/2019 revealed osteopenia with a T score of -1.5 in the AP spine L2-L4.  She has CHF and sleep apnea.  Echo on 07/02/2019 revealed an EF of 45%.  She is scheduled to have a root canal and a crown.  She received her last Moderna COVID-19 vaccine at the end  or 06/2019.   Symptomatically, she is tolerating tamoxifen well.  She has some hot flashes.  Exam is stable.  Plan: 1.   Labs today:  LFTs, CA27.29. 2.   Stage IA right breast cancer             She is s/p lumpectomy and SLN biopsy.             Pathology revealed a 4 mm grade 1 tumor.                         Tumor is ER and PR positive and HER-2/neu negative.             Patient completed radiation therapy on 11/17/2019.             She began tamoxifen on 11/27/2019.  Rx: tamoxifen 20 mg p.o. daily (dispense #90 with 1 refill). 3.   Osteopenia             Continue to encourage calcium, vitamin D and exercise. 4.   RTC in 3 months for MD assessment and labs (CBC with diff, CMP, CA 27-29).  I discussed the assessment and treatment plan with the patient.  The patient was provided an opportunity to ask questions and all were answered.  The patient agreed with  the plan and demonstrated an understanding of the instructions.  The patient was advised to call back if the symptoms worsen or if the condition fails to improve as anticipated.   Lequita Asal, MD, PhD    12/28/2019, 2:30 PM  I, Jacqualyn Posey, am acting as a Education administrator for [employer]. Mike Gip, MD.   I, Melissa C. Mike Gip, MD, have reviewed the above documentation for accuracy and completeness, and I agree with the above.

## 2019-12-28 ENCOUNTER — Encounter: Payer: Self-pay | Admitting: Hematology and Oncology

## 2019-12-28 ENCOUNTER — Inpatient Hospital Stay (HOSPITAL_BASED_OUTPATIENT_CLINIC_OR_DEPARTMENT_OTHER): Payer: Medicare Other | Admitting: Hematology and Oncology

## 2019-12-28 ENCOUNTER — Other Ambulatory Visit: Payer: Self-pay

## 2019-12-28 ENCOUNTER — Inpatient Hospital Stay: Payer: Medicare Other | Attending: Hematology and Oncology

## 2019-12-28 VITALS — BP 144/83 | HR 72 | Temp 97.4°F | Wt 228.0 lb

## 2019-12-28 DIAGNOSIS — Z923 Personal history of irradiation: Secondary | ICD-10-CM | POA: Diagnosis not present

## 2019-12-28 DIAGNOSIS — C50411 Malignant neoplasm of upper-outer quadrant of right female breast: Secondary | ICD-10-CM | POA: Diagnosis not present

## 2019-12-28 DIAGNOSIS — C50912 Malignant neoplasm of unspecified site of left female breast: Secondary | ICD-10-CM | POA: Insufficient documentation

## 2019-12-28 DIAGNOSIS — M8580 Other specified disorders of bone density and structure, unspecified site: Secondary | ICD-10-CM | POA: Diagnosis not present

## 2019-12-28 DIAGNOSIS — Z170 Estrogen receptor positive status [ER+]: Secondary | ICD-10-CM | POA: Diagnosis not present

## 2019-12-28 DIAGNOSIS — C50911 Malignant neoplasm of unspecified site of right female breast: Secondary | ICD-10-CM | POA: Insufficient documentation

## 2019-12-28 DIAGNOSIS — I509 Heart failure, unspecified: Secondary | ICD-10-CM | POA: Diagnosis not present

## 2019-12-28 DIAGNOSIS — I110 Hypertensive heart disease with heart failure: Secondary | ICD-10-CM | POA: Insufficient documentation

## 2019-12-28 DIAGNOSIS — Z87891 Personal history of nicotine dependence: Secondary | ICD-10-CM | POA: Insufficient documentation

## 2019-12-28 DIAGNOSIS — M8588 Other specified disorders of bone density and structure, other site: Secondary | ICD-10-CM | POA: Diagnosis not present

## 2019-12-28 DIAGNOSIS — Z79899 Other long term (current) drug therapy: Secondary | ICD-10-CM | POA: Insufficient documentation

## 2019-12-28 DIAGNOSIS — Z79810 Long term (current) use of selective estrogen receptor modulators (SERMs): Secondary | ICD-10-CM | POA: Diagnosis not present

## 2019-12-28 LAB — HEPATIC FUNCTION PANEL
ALT: 14 U/L (ref 0–44)
AST: 19 U/L (ref 15–41)
Albumin: 4.1 g/dL (ref 3.5–5.0)
Alkaline Phosphatase: 73 U/L (ref 38–126)
Bilirubin, Direct: <0.1 mg/dL (ref 0.0–0.2)
Total Bilirubin: 0.6 mg/dL (ref 0.3–1.2)
Total Protein: 7.2 g/dL (ref 6.5–8.1)

## 2019-12-28 MED ORDER — TAMOXIFEN CITRATE 20 MG PO TABS: 20.0000 mg | ORAL_TABLET | Freq: Every day | ORAL | 1 refills | Status: DC

## 2019-12-28 NOTE — Progress Notes (Signed)
No new changes noted today 

## 2019-12-29 LAB — CANCER ANTIGEN 27.29: CA 27.29: 19.7 U/mL (ref 0.0–38.6)

## 2019-12-30 ENCOUNTER — Ambulatory Visit
Admission: RE | Admit: 2019-12-30 | Discharge: 2019-12-30 | Disposition: A | Discharge status: Home / Self Care | Payer: Medicare Other | Source: Ambulatory Visit | Attending: Radiation Oncology | Admitting: Radiation Oncology

## 2019-12-30 ENCOUNTER — Other Ambulatory Visit: Payer: Self-pay

## 2019-12-30 VITALS — Temp 96.4°F | Wt 230.0 lb

## 2019-12-30 DIAGNOSIS — C50411 Malignant neoplasm of upper-outer quadrant of right female breast: Secondary | ICD-10-CM

## 2019-12-30 DIAGNOSIS — Z170 Estrogen receptor positive status [ER+]: Secondary | ICD-10-CM | POA: Diagnosis not present

## 2019-12-30 DIAGNOSIS — Z923 Personal history of irradiation: Secondary | ICD-10-CM | POA: Diagnosis not present

## 2019-12-30 DIAGNOSIS — Z79810 Long term (current) use of selective estrogen receptor modulators (SERMs): Secondary | ICD-10-CM | POA: Diagnosis not present

## 2019-12-30 NOTE — Progress Notes (Signed)
Radiation Oncology Follow up Note  Name: Melissa Merritt   Date:   12/30/2019 MRN:  [id number] DOB: January 12, 1951    This 69 y.o. female presents to the clinic today for 1 month follow-up status post whole breast radiation to her right breast for stage I ER/PR positive invasive mammary carcinoma.  REFERRING PROVIDER: Venita Lick, NP  HPI: Patient is a 69 year old female now at 1 month having completed whole breast radiation to her right breast for stage I (T1 a N0 M0) ER/PR positive invasive mammary carcinoma.  Seen today in routine follow-up she is doing well specifically denies breast tenderness cough or bone pain..  She has been started on tamoxifen tolerating that well without side effect.  COMPLICATIONS OF TREATMENT: none  FOLLOW UP COMPLIANCE: keeps appointments   PHYSICAL EXAM:  Temp (!) 96.4 F (35.8 C) (Tympanic)   Wt 230 lb (104.3 kg)   BMI 37.12 kg/m  Lungs are clear to A&P cardiac examination essentially unremarkable with regular rate and rhythm. No dominant mass or nodularity is noted in either breast in 2 positions examined. Incision is well-healed. No axillary or supraclavicular adenopathy is appreciated. Cosmetic result is excellent.  Well-developed well-nourished patient in NAD. HEENT reveals PERLA, EOMI, discs not visualized.  Oral cavity is clear. No oral mucosal lesions are identified. Neck is clear without evidence of cervical or supraclavicular adenopathy. Lungs are clear to A&P. Cardiac examination is essentially unremarkable with regular rate and rhythm without murmur rub or thrill. Abdomen is benign with no organomegaly or masses noted. Motor sensory and DTR levels are equal and symmetric in the upper and lower extremities. Cranial nerves II through XII are grossly intact. Proprioception is intact. No peripheral adenopathy or edema is identified. No motor or sensory levels are noted. Crude visual fields are within normal range.  RADIOLOGY RESULTS: No current films  to review  PLAN: Present time patient is doing well 1 month out from whole breast radiation and pleased with her overall progress.  I have asked to see her back in 4 to 5 months for follow-up.  She continues on tamoxifen without side effect.  Patient knows to call with any concerns.  I would like to take this opportunity to thank you for allowing me to participate in the care of your patient.Noreene Filbert, MD

## 2020-01-18 ENCOUNTER — Other Ambulatory Visit: Payer: Self-pay | Admitting: Nurse Practitioner

## 2020-01-18 NOTE — Telephone Encounter (Signed)
Requested medication (s) are due for refill today: Yes  Requested medication (s) are on the active medication list: Yes  Last refill:  11/17/18  Future visit scheduled: Yes  Notes to clinic:  Prescriptions have expired.    Requested Prescriptions  Pending Prescriptions Disp Refills   valsartan (DIOVAN) 160 MG tablet [Pharmacy Med Name: VALSARTAN 160 MG TABLET] 90 tablet 0    Sig: Take 1 tablet (160 mg total) by mouth daily.      Cardiovascular:  Angiotensin Receptor Blockers Failed - 01/18/2020  9:20 AM      Failed - Last BP in normal range    BP Readings from Last 1 Encounters:  12/28/19 (!) 144/83          Passed - Cr in normal range and within 180 days    Creatinine  Date Value Ref Range Status  11/11/2011 0.82 0.60 - 1.30 mg/dL Final   Creatinine, Ser  Date Value Ref Range Status  11/23/2019 0.81 0.57 - 1.00 mg/dL Final          Passed - K in normal range and within 180 days    Potassium  Date Value Ref Range Status  11/23/2019 4.2 3.5 - 5.2 mmol/L Final  11/11/2011 3.8 3.5 - 5.1 mmol/L Final          Passed - Patient is not pregnant      Passed - Valid encounter within last 6 months    Recent Outpatient Visits           1 month ago Malignant neoplasm of upper-outer quadrant of right breast in female, estrogen receptor positive ([place])   [place], Jolene T, NP   7 months ago Class 2 severe obesity due to excess calories with serious comorbidity and body mass index (BMI) of 37.0 to 37.9 in adult [facility])   [place], Barbaraann Faster, NP   1 year ago Essential hypertension   [place], [place] T, NP   1 year ago Essential hypertension   [place], [place] T, NP   2 years ago Flu vaccine need   [employer], [place] T, NP       Future Appointments             In 4 months [name], Barbaraann Faster, NP [employer]   In 4 months  [employer]              pantoprazole (PROTONIX) 40 MG tablet [Pharmacy Med Name: PANTOPRAZOLE SOD DR 40 MG TAB] 180 tablet 0    Sig: Take 1 tablet (40 mg total) by mouth 2 (two) times daily.      Gastroenterology: Proton Pump Inhibitors Passed - 01/18/2020  9:20 AM      Passed - Valid encounter within last 12 months    Recent Outpatient Visits           1 month ago Malignant neoplasm of upper-outer quadrant of right breast in female, estrogen receptor positive ([place])   [place], Jolene T, NP   7 months ago Class 2 severe obesity due to excess calories with serious comorbidity and body mass index (BMI) of 37.0 to 37.9 in adult [facility])   [place], Barbaraann Faster, NP   1 year ago Essential hypertension   [place], Henrine Screws T, NP   1 year ago Essential hypertension   [place], [place]  T, NP   2 years ago Flu vaccine need   [facility], [place] T, NP       Future Appointments             In 4 months [name], Barbaraann Faster, NP [employer]   In 4 months  [employer]              metoprolol succinate (TOPROL-XL) 25 MG 24 hr tablet [Pharmacy Med Name: METOPROLOL SUCC ER 25 MG TAB] 90 tablet 0    Sig: Take 1 tablet (25 mg total) by mouth daily.      Cardiovascular:  Beta Blockers Failed - 01/18/2020  9:20 AM      Failed - Last BP in normal range    BP Readings from Last 1 Encounters:  12/28/19 (!) 144/83          Passed - Last Heart Rate in normal range    Pulse Readings from Last 1 Encounters:  12/28/19 72          Passed - Valid encounter within last 6 months    Recent Outpatient Visits           1 month ago Malignant neoplasm of upper-outer quadrant of right breast in female, estrogen receptor positive ([place])   [place], Jolene T, NP   7 months ago Class 2 severe obesity due to excess calories with  serious comorbidity and body mass index (BMI) of 37.0 to 37.9 in adult [facility])   [place], Barbaraann Faster, NP   1 year ago Essential hypertension   [place], [place] T, NP   1 year ago Essential hypertension   [place], [place] T, NP   2 years ago Flu vaccine need   [employer], [place] T, NP       Future Appointments             In 4 months [name], Barbaraann Faster, NP [employer]   In 4 months  [employer]              amLODipine (NORVASC) 5 MG tablet [Pharmacy Med Name: AMLODIPINE BESYLATE 5 MG TAB] 90 tablet 0    Sig: Take 1 tablet (5 mg total) by mouth daily.      Cardiovascular:  Calcium Channel Blockers Failed - 01/18/2020  9:20 AM      Failed - Last BP in normal range    BP Readings from Last 1 Encounters:  12/28/19 (!) 144/83          Passed - Valid encounter within last 6 months    Recent Outpatient Visits           1 month ago Malignant neoplasm of upper-outer quadrant of right breast in female, estrogen receptor positive ([place])   [place], Jolene T, NP   7 months ago Class 2 severe obesity due to excess calories with serious comorbidity and body mass index (BMI) of 37.0 to 37.9 in adult [facility])   [place] [name], Barbaraann Faster, NP   1 year ago Essential hypertension   [place], [place] T, NP   1 year ago Essential hypertension   [place], [place] T, NP   2 years ago Flu vaccine need   [facility] Venita Lick, NP       Future Appointments  In 4 months [name], Barbaraann Faster, NP [employer]   In 4 months  [employer]

## 2020-01-27 DIAGNOSIS — Z23 Encounter for immunization: Secondary | ICD-10-CM | POA: Diagnosis not present

## 2020-02-25 ENCOUNTER — Encounter: Payer: Self-pay | Admitting: *Deleted

## 2020-03-23 NOTE — Progress Notes (Signed)
[facility]  [address] [place], [place] [address] Phone: [phone]  Fax: [phone]   Clinic Day:  03/28/20  Referring physician: Venita Lick, NP  Chief Complaint: Melissa Merritt is a 69 y.o. female with stage IA right breast cancer who is seen for 3 month assessment.  HPI: The patient was last seen in the medical oncology clinic on 12/28/2019. At that time, she was tolerating tamoxifen well.  She had some hot flashes.  Exam was stable. LFTs were normal. CA27.29 was 19.7. She continued calcium and vitamin D.  The patient saw Dr. Baruch Gouty on 12/30/2019 for a follow up. She was 1 month s/p whole right breast radiation. She denied breast tenderness, cough, or bone pain. Follow up was planned for 4-5 months.  During the interim, she has been "okay." She thinks her tamoxifen "is killing her." She has been taking it for 6 months but 3 weeks ago, she starting feeling awful. She reports poor appetite, nausea, vomiting, cracked lips, itching, and a rash under her breast. She also gets fatigued in the afternoons. She held her tamoxifen for one day and had some abdominal pain but it was manageable. She has occasional diarrhea.  She thinks her symptoms may also be due to her heart failure. Her shortness of breath is stable.   The patient takes calcium BID. She states that she takes Vitamin B12 15,000 mcg daily (three 5,000 mcg tablet).  She had a root canal in 06/2019.   Past Medical History:  Diagnosis Date  . Benign tumor of lung   . Cancer [facility])    right breast  . CHF (congestive heart failure) ([place])   . Dyspnea   . GERD (gastroesophageal reflux disease)   . Hyperlipidemia   . Hypertension   . Insomnia   . Osteopenia   . Plantar fasciitis   . PONV (postoperative nausea and vomiting)    vomiting 2 days after lung surgery  . S/P pneumonectomy    right lower lobe  . Urinary incontinence     Past Surgical History:  Procedure  Laterality Date  . BREAST BIOPSY Right 07/17/2019   Affirm bx-"Ribbon" clip-path pending  . bronchoscopy    . CATARACT EXTRACTION, BILATERAL     Left- 11/21/15, right- 12/23/15  . CHOLECYSTECTOMY    . CYSTECTOMY  11/03/09   cheek  . dilation and curretage    . EYE SURGERY Bilateral    cataract  . FOOT SURGERY Bilateral 2010  . LOBECTOMY    . LUNG SURGERY Right   . PART MASTECTOMY,RADIO FREQUENCY LOCALIZER,AXILLARY SENTINEL NODE BIOPSY Right 08/13/2019   Procedure: PART MASTECTOMY,RADIO FREQUENCY LOCALIZER,AXILLARY SENTINEL NODE BIOPSY;  Surgeon: Benjamine Sprague, DO;  Location: [facility] ORS;  Service: General;  Laterality: Right;  . UPPER GI ENDOSCOPY  2005    Family History  Problem Relation Age of Onset  . COPD Mother   . Hypertension Mother   . Emphysema Mother   . Osteoporosis Mother   . Heart disease Father   . Hyperlipidemia Sister   . Hyperlipidemia Brother   . Heart disease Maternal Grandmother   . Heart disease Maternal Grandfather   . Heart disease Paternal Grandmother   . Stroke Paternal Grandmother   . Heart disease Paternal Grandfather   . Stroke Paternal Grandfather   . Cancer Maternal Aunt        breast and lung  . Osteoporosis Maternal Aunt   . Aortic aneurysm Maternal Aunt   . Breast  cancer Maternal Aunt 76    Social History:  reports that she quit smoking about 40 years ago. She has never used smokeless tobacco. She reports that she does not drink alcohol and does not use drugs. She has not been exposed to radiations or toxins. She is retired. She used to work at [employer].    Her husband is status post CVA.  She lives in [place]. The patient is alone today.   Allergies:  Allergies  Allergen Reactions  . Contrast Media [Iodinated Diagnostic Agents] Swelling    Eye swelling shut  . Other Swelling    Eye swelling shut IV contrast media   . Ace Inhibitors Other (See Comments)    "Makes me feel strange."  . Hctz [Hydrochlorothiazide] Other (See Comments)     arms ache  . Keflex [Cephalexin] Other (See Comments)    thrush    Current Medications: Current Outpatient Medications  Medication Sig Dispense Refill  . amLODipine (NORVASC) 5 MG tablet Take 1 tablet (5 mg total) by mouth daily. 90 tablet 4  . aspirin 81 MG tablet Take 81 mg by mouth daily.    . Biotin 5000 MCG TABS Take 5,000 mcg by mouth 2 (two) times daily.    . Calcium Carb-Cholecalciferol (CALCIUM-VITAMIN D) 600-400 MG-UNIT TABS Take 1 tablet by mouth 2 (two) times daily.    . cholecalciferol (VITAMIN D3) 25 MCG (1000 UNIT) tablet Take 1,000 Units by mouth in the morning and at bedtime.    . Cyanocobalamin (B-12) 2500 MCG TABS Take 2,500 mcg by mouth daily.    Marland Kitchen ibuprofen (ADVIL) 200 MG tablet Take 400 mg by mouth every 6 (six) hours as needed for moderate pain.     . metoprolol succinate (TOPROL-XL) 25 MG 24 hr tablet Take 1 tablet (25 mg total) by mouth daily. 90 tablet 4  . pantoprazole (PROTONIX) 40 MG tablet Take 1 tablet (40 mg total) by mouth 2 (two) times daily. 180 tablet 4  . Red Yeast Rice Extract (RED YEAST RICE PO) Take 1 tablet by mouth in the morning and at bedtime.    . St Johns Wort 300 MG CAPS Take 300 mg by mouth 2 (two) times daily.    . tamoxifen (NOLVADEX) 20 MG tablet Take 1 tablet (20 mg total) by mouth daily. 90 tablet 1  . valsartan (DIOVAN) 160 MG tablet Take 1 tablet (160 mg total) by mouth daily. 90 tablet 4  . HYDROcodone-acetaminophen (NORCO) 5-325 MG tablet Take 1 tablet by mouth every 6 (six) hours as needed for up to 6 doses for moderate pain. (Patient not taking: No sig reported) 6 tablet 0   No current facility-administered medications for this visit.    Review of Systems  Constitutional: Positive for malaise/fatigue (in the afternoons) and weight loss (5 lbs). Negative for chills, diaphoresis and fever.       Feels "okay."  HENT: Negative for congestion, ear discharge, ear pain, nosebleeds, sinus pain, sore throat and tinnitus.        Cracked  lips  Eyes: Negative for blurred vision.  Respiratory: Positive for shortness of breath (on exertion). Negative for cough, hemoptysis, sputum production and wheezing.        Sleep apnea.  Cardiovascular: Negative for chest pain, palpitations, orthopnea and leg swelling.  Gastrointestinal: Positive for diarrhea (occasional), nausea and vomiting. Negative for abdominal pain, blood in stool, constipation, heartburn and melena.       Poor appetite  Genitourinary: Negative for dysuria, flank  pain, frequency, hematuria and urgency.       Incontinence s/p prolapsed uterus.  Musculoskeletal: Negative for back pain, joint pain, myalgias and neck pain.  Skin: Positive for itching and rash (underneath breast).  Neurological: Negative for dizziness, tingling, sensory change, weakness and headaches.  Endo/Heme/Allergies: Does not bruise/bleed easily.  Psychiatric/Behavioral: Negative for depression and memory loss. The patient is not nervous/anxious and does not have insomnia.   All other systems reviewed and are negative.  Performance status (ECOG): 1  Vitals Blood pressure (!) 158/71, pulse 83, temperature 98 F (36.7 C), temperature source Tympanic, weight 222 lb 3.6 oz (100.8 kg), SpO2 96 %.   Physical Exam Vitals and nursing note reviewed.  Constitutional:      Appearance: Normal appearance. She is normal weight.  HENT:     Head: Normocephalic and atraumatic.     Comments: Short styled brown and blonde hair.    Mouth/Throat:     Mouth: Mucous membranes are moist.     Pharynx: Oropharynx is clear.  Eyes:     General: No scleral icterus.    Extraocular Movements: Extraocular movements intact.     Conjunctiva/sclera: Conjunctivae normal.     Pupils: Pupils are equal, round, and reactive to light.     Comments: Glasses.  Cardiovascular:     Rate and Rhythm: Normal rate and regular rhythm.     Heart sounds: Normal heart sounds. No murmur heard.   Pulmonary:     Effort: Pulmonary effort  is normal. No respiratory distress.     Breath sounds: Normal breath sounds. No wheezing or rales.  Chest:     Chest wall: No tenderness.  Breasts:     Right: Skin change (fibrocystic changes medially) and tenderness (R>L) present. No swelling, bleeding, mass, axillary adenopathy or supraclavicular adenopathy.     Left: Tenderness (R>L) present. No swelling, bleeding, mass, skin change, axillary adenopathy or supraclavicular adenopathy.    Abdominal:     General: Bowel sounds are normal. There is no distension.     Palpations: Abdomen is soft. There is no hepatomegaly, splenomegaly or mass.     Tenderness: There is no abdominal tenderness. There is no guarding or rebound.  Musculoskeletal:        General: No swelling or tenderness. Normal range of motion.     Cervical back: Normal range of motion and neck supple.  Lymphadenopathy:     Head:     Right side of head: No preauricular, posterior auricular or occipital adenopathy.     Left side of head: No preauricular, posterior auricular or occipital adenopathy.     Cervical: No cervical adenopathy.     Right cervical: No superficial cervical adenopathy.    Left cervical: No superficial cervical adenopathy.     Upper Body:     Right upper body: No supraclavicular or axillary adenopathy.     Left upper body: No supraclavicular or axillary adenopathy.     Lower Body: No right inguinal adenopathy. No left inguinal adenopathy.  Skin:    General: Skin is warm and dry.  Neurological:     Mental Status: She is oriented to person, place, and time. Mental status is at baseline.  Psychiatric:        Mood and Affect: Mood normal.        Behavior: Behavior normal.        Thought Content: Thought content normal.        Judgment: Judgment normal.    Appointment on 03/28/2020  Component Date Value Ref Range Status  . Sodium 03/28/2020 135  135 - 145 mmol/L Final  . Potassium 03/28/2020 3.4* 3.5 - 5.1 mmol/L Final  . Chloride 03/28/2020 97* 98  - 111 mmol/L Final  . CO2 03/28/2020 28  22 - 32 mmol/L Final  . Glucose, Bld 03/28/2020 159* 70 - 99 mg/dL Final   Glucose reference range applies only to samples taken after fasting for at least 8 hours.  . BUN 03/28/2020 13  8 - 23 mg/dL Final  . Creatinine, Ser 03/28/2020 0.82  0.44 - 1.00 mg/dL Final  . Calcium 03/28/2020 8.8* 8.9 - 10.3 mg/dL Final  . Total Protein 03/28/2020 7.2  6.5 - 8.1 g/dL Final  . Albumin 03/28/2020 3.2* 3.5 - 5.0 g/dL Final  . AST 03/28/2020 18  15 - 41 U/L Final  . ALT 03/28/2020 13  0 - 44 U/L Final  . Alkaline Phosphatase 03/28/2020 79  38 - 126 U/L Final  . Total Bilirubin 03/28/2020 0.5  0.3 - 1.2 mg/dL Final  . GFR, Estimated 03/28/2020 >60  >60 mL/min Final   Comment: (NOTE) Calculated using the CKD-EPI Creatinine Equation (2021)   . Anion gap 03/28/2020 10  5 - 15 Final   Performed at [facility], [address], [place], [place] [address]  . WBC 03/28/2020 10.6* 4.0 - 10.5 K/uL Final  . RBC 03/28/2020 3.98  3.87 - 5.11 MIL/uL Final  . Hemoglobin 03/28/2020 10.9* 12.0 - 15.0 g/dL Final  . HCT 03/28/2020 34.0* 36.0 - 46.0 % Final  . MCV 03/28/2020 85.4  80.0 - 100.0 fL Final  . MCH 03/28/2020 27.4  26.0 - 34.0 pg Final  . MCHC 03/28/2020 32.1  30.0 - 36.0 g/dL Final  . RDW 03/28/2020 14.2  11.5 - 15.5 % Final  . Platelets 03/28/2020 321  150 - 400 K/uL Final  . nRBC 03/28/2020 0.0  0.0 - 0.2 % Final  . Neutrophils Relative % 03/28/2020 76  % Final  . Neutro Abs 03/28/2020 8.0* 1.7 - 7.7 K/uL Final  . Lymphocytes Relative 03/28/2020 7  % Final  . Lymphs Abs 03/28/2020 0.8  0.7 - 4.0 K/uL Final  . Monocytes Relative 03/28/2020 7  % Final  . Monocytes Absolute 03/28/2020 0.8  0.1 - 1.0 K/uL Final  . Eosinophils Relative 03/28/2020 8  % Final  . Eosinophils Absolute 03/28/2020 0.8* 0.0 - 0.5 K/uL Final  . Basophils Relative 03/28/2020 1  % Final  . Basophils Absolute 03/28/2020 0.1  0.0 - 0.1 K/uL Final  . Immature Granulocytes  03/28/2020 1  % Final  . Abs Immature Granulocytes 03/28/2020 0.14* 0.00 - 0.07 K/uL Final   Performed at [facility], [address], [place], [place] [address]    Assessment:  Melissa Merritt is a 69 y.o. female  with stage IA right breast cancer s/p partial mastectomy and right axillary sentinel lymph node biopsy on 08/13/2019.   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  There was atypical lobular hyperplasia, with ductal involvement.  Additional posterior margin revealed no evidence of residual invasive carcinoma or DCIS.  Closest margin for invasive carcinoma was > 10 mm and 2 mm for DCIS.  One lymph node was negative for malignancy.  Tumor was ER + (91-100%), PR+ (91-100%), and Her2/neu -.  Pathologic stage was pT1a pN0 (sn).  She underwent stereotactic biopsy on 07/17/2019.  Pathology revealed a 4 mm grade I  invasive mammary carcinoma with DCIS.  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    She received 5040 cGy to her right breast and a 1400 cGy boost her scar from 09/28/2019- 11/17/2019.  She began tamoxifen on 11/27/2019.  Bilateral diagnostic mammogram on 07/08/2019 revealed persistent architectural distortion involving the UPPER OUTER QUADRANT of the RIGHT breast without sonographic correlate.  There was no pathologic RIGHT axillary lymphadenopathy.  There was no mammographic or sonographic evidence of malignancy involving the LEFT breast.    CA27.29 has been followed (0-38.6): 19.0 on 07/24/2019, 18.9 on 10/15/2019, 19.7 on 12/28/2019, and 11.0 on 03/28/2020.  Bone density on 08/04/2019 revealed osteopenia with a T score of -1.5 in the AP spine L2-L4.  She has CHF and sleep apnea.  Echo on 07/02/2019 revealed an EF of 45%.  She is scheduled to have a root canal and a crown.  She received her last Moderna COVID-19 vaccine at the end or 06/2019.   Symptomatically, she thinks  her tamoxifen "is killing her." She feels awful. She reports poor appetite, nausea, vomiting, cracked lips, itching, and a rash under her breast. She agets fatigued in the afternoons. Exam is stable.  Plan: 1.   Labs today:  CBC with diff, CMP, CA 27.29. 2.   Stage IA right breast cancer             She is s/p lumpectomy and SLN biopsy.             Pathology revealed a 4 mm grade 1 tumor.                         Tumor is ER and PR positive and HER-2/neu negative.             Patient completed radiation therapy on 11/17/2019.             She began tamoxifen on 11/27/2019.  Symptomatically, she is doing poorly.   She feels her symptoms are due to tamoxifen.  Discuss plans to hold tamoxifen and reassess in 2 weeks.  Information provided on letrozole. 3.   Osteopenia             Bone density on 08/04/2019 revealed osteopenia with a T score of -1.5.  Continue calcium, vitamin D and exercise 4.  Mild normocytic anemia  Patient denies any bleeding.  Appetite is poor.  Labs today:  ferritin, iron studies, B12, folate. 5.   Hold tamoxifen. 6.   RTC in 2 weeks for MD assessment, labs (CBC with diff, CMP), and discussion regarding endocrine therapy.  I discussed the assessment and treatment plan with the patient.  The patient was provided an opportunity to ask questions and all were answered.  The patient agreed with the plan and demonstrated an understanding of the instructions.  The patient was advised to call back if the symptoms worsen or if the condition fails to improve as anticipated.   Lequita Asal, MD, PhD    03/28/2020, 5:19 PM   I, Mirian Mo Tufford, am acting as a Education administrator for [employer]. Mike Gip, MD.   I, Maleiah Dula C. Mike Gip, MD, have reviewed the above documentation for accuracy and completeness, and I agree with the above.

## 2020-03-28 ENCOUNTER — Encounter: Payer: Self-pay | Admitting: Hematology and Oncology

## 2020-03-28 ENCOUNTER — Inpatient Hospital Stay: Payer: Medicare Other | Attending: Hematology and Oncology | Admitting: Hematology and Oncology

## 2020-03-28 ENCOUNTER — Telehealth: Payer: Self-pay

## 2020-03-28 ENCOUNTER — Inpatient Hospital Stay: Payer: Medicare Other

## 2020-03-28 ENCOUNTER — Other Ambulatory Visit: Payer: Self-pay

## 2020-03-28 VITALS — BP 158/71 | HR 83 | Temp 98.0°F | Wt 222.2 lb

## 2020-03-28 DIAGNOSIS — Z923 Personal history of irradiation: Secondary | ICD-10-CM | POA: Insufficient documentation

## 2020-03-28 DIAGNOSIS — C50411 Malignant neoplasm of upper-outer quadrant of right female breast: Secondary | ICD-10-CM | POA: Diagnosis not present

## 2020-03-28 DIAGNOSIS — I509 Heart failure, unspecified: Secondary | ICD-10-CM | POA: Diagnosis not present

## 2020-03-28 DIAGNOSIS — C50912 Malignant neoplasm of unspecified site of left female breast: Secondary | ICD-10-CM | POA: Insufficient documentation

## 2020-03-28 DIAGNOSIS — M8588 Other specified disorders of bone density and structure, other site: Secondary | ICD-10-CM | POA: Diagnosis not present

## 2020-03-28 DIAGNOSIS — Z170 Estrogen receptor positive status [ER+]: Secondary | ICD-10-CM

## 2020-03-28 DIAGNOSIS — I110 Hypertensive heart disease with heart failure: Secondary | ICD-10-CM | POA: Insufficient documentation

## 2020-03-28 DIAGNOSIS — Z79899 Other long term (current) drug therapy: Secondary | ICD-10-CM | POA: Insufficient documentation

## 2020-03-28 DIAGNOSIS — D649 Anemia, unspecified: Secondary | ICD-10-CM

## 2020-03-28 DIAGNOSIS — M8580 Other specified disorders of bone density and structure, unspecified site: Secondary | ICD-10-CM | POA: Diagnosis not present

## 2020-03-28 DIAGNOSIS — Z87891 Personal history of nicotine dependence: Secondary | ICD-10-CM | POA: Insufficient documentation

## 2020-03-28 DIAGNOSIS — T386X5A Adverse effect of antigonadotrophins, antiestrogens, antiandrogens, not elsewhere classified, initial encounter: Secondary | ICD-10-CM

## 2020-03-28 DIAGNOSIS — C50911 Malignant neoplasm of unspecified site of right female breast: Secondary | ICD-10-CM | POA: Insufficient documentation

## 2020-03-28 DIAGNOSIS — Z79810 Long term (current) use of selective estrogen receptor modulators (SERMs): Secondary | ICD-10-CM | POA: Diagnosis not present

## 2020-03-28 LAB — VITAMIN B12: Vitamin B-12: 6729 pg/mL — ABNORMAL HIGH (ref 180–914)

## 2020-03-28 LAB — CBC WITH DIFFERENTIAL/PLATELET
Abs Immature Granulocytes: 0.14 10*3/uL — ABNORMAL HIGH (ref 0.00–0.07)
Basophils Absolute: 0.1 10*3/uL (ref 0.0–0.1)
Basophils Relative: 1 %
Eosinophils Absolute: 0.8 10*3/uL — ABNORMAL HIGH (ref 0.0–0.5)
Eosinophils Relative: 8 %
HCT: 34 % — ABNORMAL LOW (ref 36.0–46.0)
Hemoglobin: 10.9 g/dL — ABNORMAL LOW (ref 12.0–15.0)
Immature Granulocytes: 1 %
Lymphocytes Relative: 7 %
Lymphs Abs: 0.8 10*3/uL (ref 0.7–4.0)
MCH: 27.4 pg (ref 26.0–34.0)
MCHC: 32.1 g/dL (ref 30.0–36.0)
MCV: 85.4 fL (ref 80.0–100.0)
Monocytes Absolute: 0.8 10*3/uL (ref 0.1–1.0)
Monocytes Relative: 7 %
Neutro Abs: 8 10*3/uL — ABNORMAL HIGH (ref 1.7–7.7)
Neutrophils Relative %: 76 %
Platelets: 321 10*3/uL (ref 150–400)
RBC: 3.98 MIL/uL (ref 3.87–5.11)
RDW: 14.2 % (ref 11.5–15.5)
WBC: 10.6 10*3/uL — ABNORMAL HIGH (ref 4.0–10.5)
nRBC: 0 % (ref 0.0–0.2)

## 2020-03-28 LAB — COMPREHENSIVE METABOLIC PANEL
ALT: 13 U/L (ref 0–44)
AST: 18 U/L (ref 15–41)
Albumin: 3.2 g/dL — ABNORMAL LOW (ref 3.5–5.0)
Alkaline Phosphatase: 79 U/L (ref 38–126)
Anion gap: 10 (ref 5–15)
BUN: 13 mg/dL (ref 8–23)
CO2: 28 mmol/L (ref 22–32)
Calcium: 8.8 mg/dL — ABNORMAL LOW (ref 8.9–10.3)
Chloride: 97 mmol/L — ABNORMAL LOW (ref 98–111)
Creatinine, Ser: 0.82 mg/dL (ref 0.44–1.00)
GFR, Estimated: >60 mL/min (ref >60)
Glucose, Bld: 159 mg/dL — ABNORMAL HIGH (ref 70–99)
Potassium: 3.4 mmol/L — ABNORMAL LOW (ref 3.5–5.1)
Sodium: 135 mmol/L (ref 135–145)
Total Bilirubin: 0.5 mg/dL (ref 0.3–1.2)
Total Protein: 7.2 g/dL (ref 6.5–8.1)

## 2020-03-28 LAB — IRON AND TIBC
Iron: 23 ug/dL — ABNORMAL LOW (ref 28–170)
Saturation Ratios: 9 % — ABNORMAL LOW (ref 10.4–31.8)
TIBC: 248 ug/dL — ABNORMAL LOW (ref 250–450)
UIBC: 225 ug/dL

## 2020-03-28 LAB — FOLATE: Folate: 5.2 ng/mL — ABNORMAL LOW (ref >5.9)

## 2020-03-28 LAB — FERRITIN: Ferritin: 100 ng/mL (ref 11–307)

## 2020-03-28 NOTE — Patient Instructions (Addendum)
Do NOT take tamoxifen   Letrozole tablets What is this medicine? LETROZOLE (LET roe zole) blocks the production of estrogen. It is used to treat breast cancer. This medicine may be used for other purposes; ask your health care provider or pharmacist if you have questions. COMMON BRAND NAME(S): Femara What should I tell my health care provider before I take this medicine? They need to know if you have any of these conditions:  high cholesterol  liver disease  osteoporosis (weak bones)  an unusual or allergic reaction to letrozole, other medicines, foods, dyes, or preservatives  pregnant or trying to get pregnant  breast-feeding How should I use this medicine? Take this medicine by mouth with a glass of water. You may take it with or without food. Follow the directions on the prescription label. Take your medicine at regular intervals. Do not take your medicine more often than directed. Do not stop taking except on your doctor's advice. Talk to your pediatrician regarding the use of this medicine in children. Special care may be needed. Overdosage: If you think you have taken too much of this medicine contact a poison control center or emergency room at once. NOTE: This medicine is only for you. Do not share this medicine with others. What if I miss a dose? If you miss a dose, take it as soon as you can. If it is almost time for your next dose, take only that dose. Do not take double or extra doses. What may interact with this medicine? Do not take this medicine with any of the following medications:  estrogens, like hormone replacement therapy or birth control pills This medicine may also interact with the following medications:  dietary supplements such as androstenedione or DHEA  prasterone  tamoxifen This list may not describe all possible interactions. Give your health care provider a list of all the medicines, herbs, non-prescription drugs, or dietary supplements you  use. Also tell them if you smoke, drink alcohol, or use illegal drugs. Some items may interact with your medicine. What should I watch for while using this medicine? Tell your doctor or healthcare professional if your symptoms do not start to get better or if they get worse. Do not become pregnant while taking this medicine or for 3 weeks after stopping it. Women should inform their doctor if they wish to become pregnant or think they might be pregnant. There is a potential for serious side effects to an unborn child. Talk to your health care professional or pharmacist for more information. Do not breast-feed while taking this medicine or for 3 weeks after stopping it. This medicine may interfere with the ability to have a child. Talk with your doctor or health care professional if you are concerned about your fertility. Using this medicine for a long time may increase your risk of low bone mass. Talk to your doctor about bone health. You may get drowsy or dizzy. Do not drive, use machinery, or do anything that needs mental alertness until you know how this medicine affects you. Do not stand or sit up quickly, especially if you are an older patient. This reduces the risk of dizzy or fainting spells. You may need blood work done while you are taking this medicine. What side effects may I notice from receiving this medicine? Side effects that you should report to your doctor or health care professional as soon as possible:  allergic reactions like skin rash, itching, or hives  bone fracture  chest pain  signs  and symptoms of a blood clot such as breathing problems; changes in vision; chest pain; severe, sudden headache; pain, swelling, warmth in the leg; trouble speaking; sudden numbness or weakness of the face, arm or leg  vaginal bleeding Side effects that usually do not require medical attention (report to your doctor or health care professional if they continue or are bothersome):  bone,  back, joint, or muscle pain  dizziness  fatigue  fluid retention  headache  hot flashes, night sweats  nausea  weight gain This list may not describe all possible side effects. Call your doctor for medical advice about side effects. You may report side effects to FDA at 1-800-FDA-1088. Where should I keep my medicine? Keep out of the reach of children. Store between 15 and 30 degrees C (59 and 86 degrees F). Throw away any unused medicine after the expiration date. NOTE: This sheet is a summary. It may not cover all possible information. If you have questions about this medicine, talk to your doctor, pharmacist, or health care provider.  2020 Elsevier/Gold Standard (2015-10-31 11:10:41)

## 2020-03-28 NOTE — Telephone Encounter (Signed)
Spoke with patient regarding Patient being folate deficient and iron deficient. Dr. Mike Gip wants patient to Begin folic acid 1 mg a day and also Begin ferrous sulfate 325 mg po q day with OJ or vitamin C. We will Check folate level in 1 month. Check ferritin, iron studies and sed rate in 1 month.

## 2020-03-29 ENCOUNTER — Other Ambulatory Visit: Payer: Self-pay

## 2020-03-29 ENCOUNTER — Telehealth: Payer: Self-pay | Admitting: Hematology and Oncology

## 2020-03-29 ENCOUNTER — Telehealth: Payer: Self-pay

## 2020-03-29 DIAGNOSIS — Z170 Estrogen receptor positive status [ER+]: Secondary | ICD-10-CM

## 2020-03-29 LAB — CANCER ANTIGEN 27.29: CA 27.29: 11 U/mL (ref 0.0–38.6)

## 2020-03-29 NOTE — Telephone Encounter (Signed)
-----   Message from Lequita Asal, MD sent at 03/29/2020  6:30 AM EST ----- Regarding: Please call patient  B12 level is very high.  Stop oral B12 x 3 weeks then begin B12 1000 mcg po 3 times a week (Mon, Wed, Fri).  Check B12 level in 2 months.  M  ----- Message ----- From: Buel Ream, [facility] [place] Sent: 03/28/2020   9:53 AM EST To: Lequita Asal, MD

## 2020-03-29 NOTE — Telephone Encounter (Signed)
Spoke with the patient to inform her that her b-12 level is very high, stop oral b-12 x 3 weeks then begain b-12 1000 mcg po 3 times a week ( Monday, Wednesday and Friday. Recheck b-12 levels in 2 month

## 2020-04-11 NOTE — Progress Notes (Signed)
[facility]  [address] [place], [place] [address] Phone: [phone]  Fax: [phone]   Clinic Day:  04/12/20  Referring physician: Venita Lick, NP  Chief Complaint: Melissa Merritt is a 70 y.o. female with stage IA right breast cancer who is seen for 2 week assessment and discussion regarding endocrine therapy.  HPI: The patient was last seen in the medical oncology clinic on 03/28/2020. At that time, she felt tamoxifen was making her feel "awful".  She described poor appetite, nausea, vomiting, cracked lips, itching and a rash under her breast.  Hematocrit was 34.0, hemoglobin 10.9, MCV 85.4, platelets 321,000, WBC 10,600. CA27.29 was 11.0. Potassium was 3.4. Calcium was 8.8 with an albumin 3.2. Ferritin was 100 with an iron saturation of 9% and a TIBC of 248. Vitamin B12 was 6,729 and folate 5.2 (low).  Tamoxifen was held.   She was contacted on 03/28/2020 after clinic to begin folic acid 1 mg a day as well as ferrous sulfate 325 mg a day with orange juice or vitamin C.  She was also instructed to stop her oral B12 for 3 weeks then begin B12 1000 mcg p.o. 3 times a week (Mondays, Wednesdays and Fridays).  During the interim, she has been 80% better since stopping tamoxifen. She still gets a little bit tired in the afternoons. The rash underneath her breasts has improved. Her shortness of breath with exertion is stable. She is eating well and her diarrhea, nausea, and vomiting have resolved.   The patient started taking folic acid mg daily and oral iron.   Past Medical History:  Diagnosis Date  . Benign tumor of lung   . Cancer [facility])    right breast  . CHF (congestive heart failure) ([place])   . Dyspnea   . GERD (gastroesophageal reflux disease)   . Hyperlipidemia   . Hypertension   . Insomnia   . Osteopenia   . Plantar fasciitis   . PONV (postoperative nausea and vomiting)    vomiting 2 days after lung surgery  . S/P pneumonectomy     right lower lobe  . Urinary incontinence     Past Surgical History:  Procedure Laterality Date  . BREAST BIOPSY Right 07/17/2019   Affirm bx-"Ribbon" clip-path pending  . bronchoscopy    . CATARACT EXTRACTION, BILATERAL     Left- 11/21/15, right- 12/23/15  . CHOLECYSTECTOMY    . CYSTECTOMY  11/03/09   cheek  . dilation and curretage    . EYE SURGERY Bilateral    cataract  . FOOT SURGERY Bilateral 2010  . LOBECTOMY    . LUNG SURGERY Right   . PART MASTECTOMY,RADIO FREQUENCY LOCALIZER,AXILLARY SENTINEL NODE BIOPSY Right 08/13/2019   Procedure: PART MASTECTOMY,RADIO FREQUENCY LOCALIZER,AXILLARY SENTINEL NODE BIOPSY;  Surgeon: Benjamine Sprague, DO;  Location: [facility] ORS;  Service: General;  Laterality: Right;  . UPPER GI ENDOSCOPY  2005    Family History  Problem Relation Age of Onset  . COPD Mother   . Hypertension Mother   . Emphysema Mother   . Osteoporosis Mother   . Heart disease Father   . Hyperlipidemia Sister   . Hyperlipidemia Brother   . Heart disease Maternal Grandmother   . Heart disease Maternal Grandfather   . Heart disease Paternal Grandmother   . Stroke Paternal Grandmother   . Heart disease Paternal Grandfather   . Stroke Paternal Grandfather   . Cancer Maternal Aunt        breast and  lung  . Osteoporosis Maternal Aunt   . Aortic aneurysm Maternal Aunt   . Breast cancer Maternal Aunt 76    Social History:  reports that she quit smoking about 40 years ago. She has never used smokeless tobacco. She reports that she does not drink alcohol and does not use drugs. She has not been exposed to radiations or toxins. She is retired. She used to work at [employer].    Her husband is status post CVA.  She lives in [place]. The patient is alone today.   Allergies:  Allergies  Allergen Reactions  . Contrast Media [Iodinated Diagnostic Agents] Swelling    Eye swelling shut  . Other Swelling    Eye swelling shut IV contrast media   . Ace Inhibitors Other (See  Comments)    "Makes me feel strange."  . Hctz [Hydrochlorothiazide] Other (See Comments)    arms ache  . Keflex [Cephalexin] Other (See Comments)    thrush    Current Medications: Current Outpatient Medications  Medication Sig Dispense Refill  . amLODipine (NORVASC) 5 MG tablet Take 1 tablet (5 mg total) by mouth daily. 90 tablet 4  . aspirin 81 MG tablet Take 81 mg by mouth daily.    . Biotin 5000 MCG TABS Take 5,000 mcg by mouth 2 (two) times daily.    . Calcium Carb-Cholecalciferol (CALCIUM-VITAMIN D) 600-400 MG-UNIT TABS Take 1 tablet by mouth 2 (two) times daily.    . cholecalciferol (VITAMIN D3) 25 MCG (1000 UNIT) tablet Take 1,000 Units by mouth in the morning and at bedtime.    . Cyanocobalamin (B-12) 2500 MCG TABS Take 2,500 mcg by mouth daily.    Marland Kitchen ibuprofen (ADVIL) 200 MG tablet Take 400 mg by mouth every 6 (six) hours as needed for moderate pain.     . metoprolol succinate (TOPROL-XL) 25 MG 24 hr tablet Take 1 tablet (25 mg total) by mouth daily. 90 tablet 4  . pantoprazole (PROTONIX) 40 MG tablet Take 1 tablet (40 mg total) by mouth 2 (two) times daily. 180 tablet 4  . Red Yeast Rice Extract (RED YEAST RICE PO) Take 1 tablet by mouth in the morning and at bedtime.    . St Johns Wort 300 MG CAPS Take 300 mg by mouth 2 (two) times daily.    . tamoxifen (NOLVADEX) 20 MG tablet Take 1 tablet (20 mg total) by mouth daily. 90 tablet 1  . valsartan (DIOVAN) 160 MG tablet Take 1 tablet (160 mg total) by mouth daily. 90 tablet 4  . HYDROcodone-acetaminophen (NORCO) 5-325 MG tablet Take 1 tablet by mouth every 6 (six) hours as needed for up to 6 doses for moderate pain. (Patient not taking: No sig reported) 6 tablet 0   No current facility-administered medications for this visit.    Review of Systems  Constitutional: Positive for malaise/fatigue (in the afternoons) and weight loss (1 lb). Negative for chills, diaphoresis and fever.       Feels "80% better"  HENT: Negative for  congestion, ear discharge, ear pain, hearing loss, nosebleeds, sinus pain, sore throat and tinnitus.   Eyes: Negative for blurred vision.  Respiratory: Positive for shortness of breath (on exertion). Negative for cough, hemoptysis, sputum production and wheezing.        Sleep apnea.  Cardiovascular: Negative for chest pain, palpitations, orthopnea and leg swelling.  Gastrointestinal: Negative for abdominal pain, blood in stool, constipation, diarrhea, heartburn, melena, nausea and vomiting.  Eating well.  Genitourinary: Negative for dysuria, flank pain, frequency, hematuria and urgency.  Musculoskeletal: Negative for back pain, joint pain, myalgias and neck pain.  Skin: Positive for itching and rash (underneath breast, improved).  Neurological: Negative for dizziness, tingling, sensory change, weakness and headaches.  Endo/Heme/Allergies: Does not bruise/bleed easily.  Psychiatric/Behavioral: Negative for depression and memory loss. The patient is not nervous/anxious and does not have insomnia.   All other systems reviewed and are negative.  Performance status (ECOG): 1  Vitals Blood pressure (!) 148/75, pulse 78, temperature (!) 97.5 F (36.4 C), temperature source Tympanic, resp. rate 18, weight 221 lb 12.5 oz (100.6 kg), SpO2 99 %.   Physical Exam Vitals and nursing note reviewed.  Constitutional:      Appearance: Normal appearance. She is normal weight.  HENT:     Head:     Comments: Short styled blonde hair. Eyes:     General: No scleral icterus.    Conjunctiva/sclera: Conjunctivae normal.     Comments: Glasses.  Neurological:     Mental Status: She is oriented to person, place, and time.  Psychiatric:        Behavior: Behavior normal.        Thought Content: Thought content normal.        Judgment: Judgment normal.    Appointment on 04/12/2020  Component Date Value Ref Range Status  . WBC 04/12/2020 6.9  4.0 - 10.5 K/uL Final  . RBC 04/12/2020 4.00  3.87 - 5.11  MIL/uL Final  . Hemoglobin 04/12/2020 11.0* 12.0 - 15.0 g/dL Final  . HCT 04/12/2020 34.8* 36.0 - 46.0 % Final  . MCV 04/12/2020 87.0  80.0 - 100.0 fL Final  . MCH 04/12/2020 27.5  26.0 - 34.0 pg Final  . MCHC 04/12/2020 31.6  30.0 - 36.0 g/dL Final  . RDW 04/12/2020 15.1  11.5 - 15.5 % Final  . Platelets 04/12/2020 265  150 - 400 K/uL Final  . nRBC 04/12/2020 0.0  0.0 - 0.2 % Final  . Neutrophils Relative % 04/12/2020 63  % Final  . Neutro Abs 04/12/2020 4.4  1.7 - 7.7 K/uL Final  . Lymphocytes Relative 04/12/2020 15  % Final  . Lymphs Abs 04/12/2020 1.0  0.7 - 4.0 K/uL Final  . Monocytes Relative 04/12/2020 11  % Final  . Monocytes Absolute 04/12/2020 0.8  0.1 - 1.0 K/uL Final  . Eosinophils Relative 04/12/2020 8  % Final  . Eosinophils Absolute 04/12/2020 0.6* 0.0 - 0.5 K/uL Final  . Basophils Relative 04/12/2020 1  % Final  . Basophils Absolute 04/12/2020 0.1  0.0 - 0.1 K/uL Final  . Immature Granulocytes 04/12/2020 2  % Final  . Abs Immature Granulocytes 04/12/2020 0.10* 0.00 - 0.07 K/uL Final   Performed at [facility], [address], [place], [place] [address]  . Sodium 04/12/2020 138  135 - 145 mmol/L Final  . Potassium 04/12/2020 4.3  3.5 - 5.1 mmol/L Final  . Chloride 04/12/2020 102  98 - 111 mmol/L Final  . CO2 04/12/2020 28  22 - 32 mmol/L Final  . Glucose, Bld 04/12/2020 158* 70 - 99 mg/dL Final   Glucose reference range applies only to samples taken after fasting for at least 8 hours.  . BUN 04/12/2020 14  8 - 23 mg/dL Final  . Creatinine, Ser 04/12/2020 0.80  0.44 - 1.00 mg/dL Final  . Calcium 04/12/2020 8.9  8.9 - 10.3 mg/dL Final  . Total Protein 04/12/2020 6.9  6.5 - 8.1 g/dL Final  . Albumin 04/12/2020 3.3* 3.5 - 5.0 g/dL Final  . AST 04/12/2020 19  15 - 41 U/L Final  . ALT 04/12/2020 15  0 - 44 U/L Final  . Alkaline Phosphatase 04/12/2020 70  38 - 126 U/L Final  . Total Bilirubin 04/12/2020 0.4  0.3 - 1.2 mg/dL Final  . GFR, Estimated 04/12/2020  >60  >60 mL/min Final   Comment: (NOTE) Calculated using the CKD-EPI Creatinine Equation (2021)   . Anion gap 04/12/2020 8  5 - 15 Final   Performed at [facility], [address] [place], [place] [address]    Assessment:  Melissa Merritt is a 70 y.o. female  with stage IA right breast cancer s/p partial mastectomy and right axillary sentinel lymph node biopsy on 08/13/2019.   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  There was atypical lobular hyperplasia, with ductal involvement.  Additional posterior margin revealed no evidence of residual invasive carcinoma or DCIS.  Closest margin for invasive carcinoma was > 10 mm and 2 mm for DCIS.  One lymph node was negative for malignancy.  Tumor was ER + (91-100%), PR+ (91-100%), and Her2/neu -.  Pathologic stage was pT1a pN0 (sn).  She underwent stereotactic biopsy on 07/17/2019.  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    She received 5040 cGy to her right breast and a 1400 cGy boost her scar from 09/28/2019- 11/17/2019.  She began tamoxifen on 11/27/2019. Tamoxifen was placed on hold on 03/28/2020 as it made her feel awful.  Bilateral diagnostic mammogram on 07/08/2019 revealed persistent architectural distortion involving the upper outer quadrant of the RIGHT breast without sonographic correlate.  There was no pathologic RIGHT axillary lymphadenopathy.  There was no mammographic or sonographic evidence of malignancy involving the LEFT breast.    CA27.29 has been followed (0-38.6): 19.0 on 07/24/2019, 18.9 on 10/15/2019, 19.7 on 12/28/2019, and 11.0 on 03/28/2020.  Bone density on 08/04/2019 revealed osteopenia with a T score of -1.5 in the AP spine L2-L4.  She has folate deficiency.  Folate was 5.2 on 03/28/2020.  She began folic acid 1 mg a day on 03/28/2020. She  has iron deficiency.  Ferritin was 100.  Iron saturation was 9% with a TIBC of 248 on 03/28/2020.  She has CHF and sleep apnea.  Echo on 07/02/2019 revealed an EF of 45%.  She is scheduled to have a root canal and a crown.  She received her last Moderna COVID-19 vaccine at the end or 06/2019.   Symptomatically, she has been 80% better since stopping tamoxifen. She still gets a little bit tired in the afternoons. The rash underneath her breasts has improved. She is eating well and her diarrhea, nausea, and vomiting have resolved.   Plan: 1.   Labs today:  CBC with diff, sed rate. 2.   Stage IA right breast cancer             She is s/p lumpectomy and SLN biopsy.             Pathology revealed a 4 mm grade 1 tumor.                         Tumor is ER and PR positive and HER-2/neu negative.  Patient completed radiation therapy on 11/17/2019.             She was on tamoxifen from 11/27/2019 - 03/28/2020.   Tamoxifen was discontinued secondary to multiple symptoms.   Discuss consideration of Femara.   Side effects reviewed. Information provided.    Patient agrees to treatment.  Rx:  Femara 25 mg p.o. daily (dispense #30 with 1 refill). 3.   Osteopenia             She has a T score of -1.5 in the AP spine.  Discuss calcium and vitamin D.  Discuss consideration of Prolia.    Information provided.    Discuss need for dental clearance.  Preauth Prolia. 4.   Iron deficiency anemia  Hematocrit 34.8.  Hemoglobin 11.0.  MCV 87.0 on 04/12/2020.   Ferritin 100 with an iron saturation of 9% and a TIBC of 248 on 03/28/2020.  Ferrous ferritin falsely elevated secondary to high sed rate (56).  She began oral iron on 03/28/2020. 5.   Folate deficiency  Folate was 5.2 on 03/28/2020.  She began folic acid 1 mg a day on 03/28/2020  B12 was 6729 (high) on 03/28/2020.   He was instructed to hold B12 x3 weeks and begin B12 3 days a week 6.   Preauth Prolia. 7.   Begin Femara. 8.   RTC in 1  month for MD assessment and labs (LFTs, folate level).  I discussed the assessment and treatment plan with the patient.  The patient was provided an opportunity to ask questions and all were answered.  The patient agreed with the plan and demonstrated an understanding of the instructions.  The patient was advised to call back if the symptoms worsen or if the condition fails to improve as anticipated.   Lequita Asal, MD, PhD    04/12/2020. 5:00 PM  I, Mirian Mo Tufford, am acting as a Education administrator for [employer]. Mike Gip, MD.   I, Khadir Roam C. Mike Gip, MD, have reviewed the above documentation for accuracy and completeness, and I agree with the above.

## 2020-04-12 ENCOUNTER — Inpatient Hospital Stay: Payer: Medicare Other

## 2020-04-12 ENCOUNTER — Encounter: Payer: Self-pay | Admitting: Hematology and Oncology

## 2020-04-12 ENCOUNTER — Other Ambulatory Visit: Payer: Self-pay

## 2020-04-12 ENCOUNTER — Inpatient Hospital Stay: Payer: Medicare Other | Attending: Hematology and Oncology | Admitting: Hematology and Oncology

## 2020-04-12 ENCOUNTER — Other Ambulatory Visit: Payer: Self-pay | Admitting: Hematology and Oncology

## 2020-04-12 VITALS — BP 148/75 | HR 78 | Temp 97.5°F | Resp 18 | Wt 221.8 lb

## 2020-04-12 DIAGNOSIS — Z9049 Acquired absence of other specified parts of digestive tract: Secondary | ICD-10-CM | POA: Insufficient documentation

## 2020-04-12 DIAGNOSIS — R630 Anorexia: Secondary | ICD-10-CM | POA: Diagnosis not present

## 2020-04-12 DIAGNOSIS — Z79811 Long term (current) use of aromatase inhibitors: Secondary | ICD-10-CM | POA: Diagnosis not present

## 2020-04-12 DIAGNOSIS — R21 Rash and other nonspecific skin eruption: Secondary | ICD-10-CM | POA: Diagnosis not present

## 2020-04-12 DIAGNOSIS — C50911 Malignant neoplasm of unspecified site of right female breast: Secondary | ICD-10-CM | POA: Diagnosis not present

## 2020-04-12 DIAGNOSIS — Z923 Personal history of irradiation: Secondary | ICD-10-CM | POA: Diagnosis not present

## 2020-04-12 DIAGNOSIS — M8580 Other specified disorders of bone density and structure, unspecified site: Secondary | ICD-10-CM | POA: Diagnosis not present

## 2020-04-12 DIAGNOSIS — Z170 Estrogen receptor positive status [ER+]: Secondary | ICD-10-CM | POA: Insufficient documentation

## 2020-04-12 DIAGNOSIS — C50411 Malignant neoplasm of upper-outer quadrant of right female breast: Secondary | ICD-10-CM | POA: Diagnosis not present

## 2020-04-12 DIAGNOSIS — E538 Deficiency of other specified B group vitamins: Secondary | ICD-10-CM

## 2020-04-12 DIAGNOSIS — D509 Iron deficiency anemia, unspecified: Secondary | ICD-10-CM

## 2020-04-12 DIAGNOSIS — R112 Nausea with vomiting, unspecified: Secondary | ICD-10-CM | POA: Insufficient documentation

## 2020-04-12 DIAGNOSIS — I509 Heart failure, unspecified: Secondary | ICD-10-CM | POA: Insufficient documentation

## 2020-04-12 DIAGNOSIS — T386X5D Adverse effect of antigonadotrophins, antiestrogens, antiandrogens, not elsewhere classified, subsequent encounter: Secondary | ICD-10-CM | POA: Diagnosis not present

## 2020-04-12 DIAGNOSIS — M8588 Other specified disorders of bone density and structure, other site: Secondary | ICD-10-CM

## 2020-04-12 DIAGNOSIS — G4730 Sleep apnea, unspecified: Secondary | ICD-10-CM | POA: Insufficient documentation

## 2020-04-12 DIAGNOSIS — Z87891 Personal history of nicotine dependence: Secondary | ICD-10-CM | POA: Diagnosis not present

## 2020-04-12 DIAGNOSIS — I110 Hypertensive heart disease with heart failure: Secondary | ICD-10-CM | POA: Diagnosis not present

## 2020-04-12 LAB — CBC WITH DIFFERENTIAL/PLATELET
Abs Immature Granulocytes: 0.1 10*3/uL — ABNORMAL HIGH (ref 0.00–0.07)
Basophils Absolute: 0.1 10*3/uL (ref 0.0–0.1)
Basophils Relative: 1 %
Eosinophils Absolute: 0.6 10*3/uL — ABNORMAL HIGH (ref 0.0–0.5)
Eosinophils Relative: 8 %
HCT: 34.8 % — ABNORMAL LOW (ref 36.0–46.0)
Hemoglobin: 11 g/dL — ABNORMAL LOW (ref 12.0–15.0)
Immature Granulocytes: 2 %
Lymphocytes Relative: 15 %
Lymphs Abs: 1 10*3/uL (ref 0.7–4.0)
MCH: 27.5 pg (ref 26.0–34.0)
MCHC: 31.6 g/dL (ref 30.0–36.0)
MCV: 87 fL (ref 80.0–100.0)
Monocytes Absolute: 0.8 10*3/uL (ref 0.1–1.0)
Monocytes Relative: 11 %
Neutro Abs: 4.4 10*3/uL (ref 1.7–7.7)
Neutrophils Relative %: 63 %
Platelets: 265 10*3/uL (ref 150–400)
RBC: 4 MIL/uL (ref 3.87–5.11)
RDW: 15.1 % (ref 11.5–15.5)
WBC: 6.9 10*3/uL (ref 4.0–10.5)
nRBC: 0 % (ref 0.0–0.2)

## 2020-04-12 LAB — COMPREHENSIVE METABOLIC PANEL
ALT: 15 U/L (ref 0–44)
AST: 19 U/L (ref 15–41)
Albumin: 3.3 g/dL — ABNORMAL LOW (ref 3.5–5.0)
Alkaline Phosphatase: 70 U/L (ref 38–126)
Anion gap: 8 (ref 5–15)
BUN: 14 mg/dL (ref 8–23)
CO2: 28 mmol/L (ref 22–32)
Calcium: 8.9 mg/dL (ref 8.9–10.3)
Chloride: 102 mmol/L (ref 98–111)
Creatinine, Ser: 0.8 mg/dL (ref 0.44–1.00)
GFR, Estimated: >60 mL/min (ref >60)
Glucose, Bld: 158 mg/dL — ABNORMAL HIGH (ref 70–99)
Potassium: 4.3 mmol/L (ref 3.5–5.1)
Sodium: 138 mmol/L (ref 135–145)
Total Bilirubin: 0.4 mg/dL (ref 0.3–1.2)
Total Protein: 6.9 g/dL (ref 6.5–8.1)

## 2020-04-12 LAB — SEDIMENTATION RATE: Sed Rate: 56 mm/hr — ABNORMAL HIGH (ref 0–30)

## 2020-04-12 MED ORDER — LETROZOLE 2.5 MG PO TABS: 2.5000 mg | ORAL_TABLET | Freq: Every day | ORAL | 1 refills | Status: DC

## 2020-04-12 NOTE — Patient Instructions (Addendum)
Follow-up with dentist about clearance for Prolia   Denosumab injection What is this medicine? DENOSUMAB (den oh sue mab) slows bone breakdown. Prolia is used to treat osteoporosis in women after menopause and in men, and in people who are taking corticosteroids for 6 months or more. Delton See is used to treat a high calcium level due to cancer and to prevent bone fractures and other bone problems caused by multiple myeloma or cancer bone metastases. Delton See is also used to treat giant cell tumor of the bone. This medicine may be used for other purposes; ask your health care provider or pharmacist if you have questions. COMMON BRAND NAME(S): Prolia, XGEVA What should I tell my health care provider before I take this medicine? They need to know if you have any of these conditions:  dental disease  having surgery or tooth extraction  infection  kidney disease  low levels of calcium or Vitamin D in the blood  malnutrition  on hemodialysis  skin conditions or sensitivity  thyroid or parathyroid disease  an unusual reaction to denosumab, other medicines, foods, dyes, or preservatives  pregnant or trying to get pregnant  breast-feeding How should I use this medicine? This medicine is for injection under the skin. It is given by a health care professional in a hospital or clinic setting. A special MedGuide will be given to you before each treatment. Be sure to read this information carefully each time. For Prolia, talk to your pediatrician regarding the use of this medicine in children. Special care may be needed. For Delton See, talk to your pediatrician regarding the use of this medicine in children. While this drug may be prescribed for children as young as 13 years for selected conditions, precautions do apply. Overdosage: If you think you have taken too much of this medicine contact a poison control center or emergency room at once. NOTE: This medicine is only for you. Do not share this  medicine with others. What if I miss a dose? It is important not to miss your dose. Call your doctor or health care professional if you are unable to keep an appointment. What may interact with this medicine? Do not take this medicine with any of the following medications:  other medicines containing denosumab This medicine may also interact with the following medications:  medicines that lower your chance of fighting infection  steroid medicines like prednisone or cortisone This list may not describe all possible interactions. Give your health care provider a list of all the medicines, herbs, non-prescription drugs, or dietary supplements you use. Also tell them if you smoke, drink alcohol, or use illegal drugs. Some items may interact with your medicine. What should I watch for while using this medicine? Visit your doctor or health care professional for regular checks on your progress. Your doctor or health care professional may order blood tests and other tests to see how you are doing. Call your doctor or health care professional for advice if you get a fever, chills or sore throat, or other symptoms of a cold or flu. Do not treat yourself. This drug may decrease your body's ability to fight infection. Try to avoid being around people who are sick. You should make sure you get enough calcium and vitamin D while you are taking this medicine, unless your doctor tells you not to. Discuss the foods you eat and the vitamins you take with your health care professional. See your dentist regularly. Brush and floss your teeth as directed. Before you have  any dental work done, tell your dentist you are receiving this medicine. Do not become pregnant while taking this medicine or for 5 months after stopping it. Talk with your doctor or health care professional about your birth control options while taking this medicine. Women should inform their doctor if they wish to become pregnant or think they might  be pregnant. There is a potential for serious side effects to an unborn child. Talk to your health care professional or pharmacist for more information. What side effects may I notice from receiving this medicine? Side effects that you should report to your doctor or health care professional as soon as possible:  allergic reactions like skin rash, itching or hives, swelling of the face, lips, or tongue  bone pain  breathing problems  dizziness  jaw pain, especially after dental work  redness, blistering, peeling of the skin  signs and symptoms of infection like fever or chills; cough; sore throat; pain or trouble passing urine  signs of low calcium like fast heartbeat, muscle cramps or muscle pain; pain, tingling, numbness in the hands or feet; seizures  unusual bleeding or bruising  unusually weak or tired Side effects that usually do not require medical attention (report to your doctor or health care professional if they continue or are bothersome):  constipation  diarrhea  headache  joint pain  loss of appetite  muscle pain  runny nose  tiredness  upset stomach This list may not describe all possible side effects. Call your doctor for medical advice about side effects. You may report side effects to FDA at 1-800-FDA-1088. Where should I keep my medicine? This medicine is only given in a clinic, doctor's office, or other health care setting and will not be stored at home. NOTE: This sheet is a summary. It may not cover all possible information. If you have questions about this medicine, talk to your doctor, pharmacist, or health care provider.  2020 Elsevier/Gold Standard (2017-08-02 16:10:44)    Letrozole tablets What is this medicine? LETROZOLE (LET roe zole) blocks the production of estrogen. It is used to treat breast cancer. This medicine may be used for other purposes; ask your health care provider or pharmacist if you have questions. COMMON BRAND  NAME(S): Femara What should I tell my health care provider before I take this medicine? They need to know if you have any of these conditions:  high cholesterol  liver disease  osteoporosis (weak bones)  an unusual or allergic reaction to letrozole, other medicines, foods, dyes, or preservatives  pregnant or trying to get pregnant  breast-feeding How should I use this medicine? Take this medicine by mouth with a glass of water. You may take it with or without food. Follow the directions on the prescription label. Take your medicine at regular intervals. Do not take your medicine more often than directed. Do not stop taking except on your doctor's advice. Talk to your pediatrician regarding the use of this medicine in children. Special care may be needed. Overdosage: If you think you have taken too much of this medicine contact a poison control center or emergency room at once. NOTE: This medicine is only for you. Do not share this medicine with others. What if I miss a dose? If you miss a dose, take it as soon as you can. If it is almost time for your next dose, take only that dose. Do not take double or extra doses. What may interact with this medicine? Do not take this medicine  with any of the following medications:  estrogens, like hormone replacement therapy or birth control pills This medicine may also interact with the following medications:  dietary supplements such as androstenedione or DHEA  prasterone  tamoxifen This list may not describe all possible interactions. Give your health care provider a list of all the medicines, herbs, non-prescription drugs, or dietary supplements you use. Also tell them if you smoke, drink alcohol, or use illegal drugs. Some items may interact with your medicine. What should I watch for while using this medicine? Tell your doctor or healthcare professional if your symptoms do not start to get better or if they get worse. Do not become  pregnant while taking this medicine or for 3 weeks after stopping it. Women should inform their doctor if they wish to become pregnant or think they might be pregnant. There is a potential for serious side effects to an unborn child. Talk to your health care professional or pharmacist for more information. Do not breast-feed while taking this medicine or for 3 weeks after stopping it. This medicine may interfere with the ability to have a child. Talk with your doctor or health care professional if you are concerned about your fertility. Using this medicine for a long time may increase your risk of low bone mass. Talk to your doctor about bone health. You may get drowsy or dizzy. Do not drive, use machinery, or do anything that needs mental alertness until you know how this medicine affects you. Do not stand or sit up quickly, especially if you are an older patient. This reduces the risk of dizzy or fainting spells. You may need blood work done while you are taking this medicine. What side effects may I notice from receiving this medicine? Side effects that you should report to your doctor or health care professional as soon as possible:  allergic reactions like skin rash, itching, or hives  bone fracture  chest pain  signs and symptoms of a blood clot such as breathing problems; changes in vision; chest pain; severe, sudden headache; pain, swelling, warmth in the leg; trouble speaking; sudden numbness or weakness of the face, arm or leg  vaginal bleeding Side effects that usually do not require medical attention (report to your doctor or health care professional if they continue or are bothersome):  bone, back, joint, or muscle pain  dizziness  fatigue  fluid retention  headache  hot flashes, night sweats  nausea  weight gain This list may not describe all possible side effects. Call your doctor for medical advice about side effects. You may report side effects to FDA at  1-800-FDA-1088. Where should I keep my medicine? Keep out of the reach of children. Store between 15 and 30 degrees C (59 and 86 degrees F). Throw away any unused medicine after the expiration date. NOTE: This sheet is a summary. It may not cover all possible information. If you have questions about this medicine, talk to your doctor, pharmacist, or health care provider.  2020 Elsevier/Gold Standard (2015-10-31 11:10:41)

## 2020-04-17 DIAGNOSIS — T386X5A Adverse effect of antigonadotrophins, antiestrogens, antiandrogens, not elsewhere classified, initial encounter: Secondary | ICD-10-CM | POA: Insufficient documentation

## 2020-04-17 DIAGNOSIS — D649 Anemia, unspecified: Secondary | ICD-10-CM | POA: Insufficient documentation

## 2020-04-28 DIAGNOSIS — Z23 Encounter for immunization: Secondary | ICD-10-CM | POA: Diagnosis not present

## 2020-05-08 DIAGNOSIS — D509 Iron deficiency anemia, unspecified: Secondary | ICD-10-CM | POA: Insufficient documentation

## 2020-05-09 NOTE — Progress Notes (Signed)
[facility]  [address] [place], [place] [address] Phone: [phone]  Fax: [phone]   Clinic Day:  05/10/20  Referring physician: Venita Lick, NP  Chief Complaint: Melissa Merritt is a 70 y.o. female with stage IA right breast cancer who is seen for 1 month assessment on Femara.  HPI: The patient was last seen in the medical oncology clinic on 04/12/2020. At that time, she was 80% better since stopping tamoxifen. She still felt a little bit tired in the afternoons. The rash underneath her breasts had improved. She was eating well; her diarrhea, nausea, and vomiting had resolved. Hematocrit was 34.8, hemoglobin 11.0, MCV 87.0, platelets 265,000, WBC 6,900. Sed rate was 56. Albumin was 3.3. She was to begin Femara.  During the interim, she has been "good." She has tolerated Femara well without side effects. Her energy level is good. Her shortness of breath and sleep apnea are stable. The rash under her breast comes and goes.  She takes oral iron once daily with orange juice. She takes folic acid 1 mg daily. She takes Vitamin B12 every other day.  She is going to contact her dentist for dental clearance for Prolia.   Past Medical History:  Diagnosis Date  . Benign tumor of lung   . Cancer [facility])    right breast  . CHF (congestive heart failure) ([place])   . Dyspnea   . GERD (gastroesophageal reflux disease)   . Hyperlipidemia   . Hypertension   . Insomnia   . Osteopenia   . Plantar fasciitis   . PONV (postoperative nausea and vomiting)    vomiting 2 days after lung surgery  . S/P pneumonectomy    right lower lobe  . Urinary incontinence     Past Surgical History:  Procedure Laterality Date  . BREAST BIOPSY Right 07/17/2019   Affirm bx-"Ribbon" clip-path pending  . bronchoscopy    . CATARACT EXTRACTION, BILATERAL     Left- 11/21/15, right- 12/23/15  . CHOLECYSTECTOMY    . CYSTECTOMY  11/03/09   cheek  . dilation and curretage     . EYE SURGERY Bilateral    cataract  . FOOT SURGERY Bilateral 2010  . LOBECTOMY    . LUNG SURGERY Right   . PART MASTECTOMY,RADIO FREQUENCY LOCALIZER,AXILLARY SENTINEL NODE BIOPSY Right 08/13/2019   Procedure: PART MASTECTOMY,RADIO FREQUENCY LOCALIZER,AXILLARY SENTINEL NODE BIOPSY;  Surgeon: Benjamine Sprague, DO;  Location: [facility] ORS;  Service: General;  Laterality: Right;  . UPPER GI ENDOSCOPY  2005    Family History  Problem Relation Age of Onset  . COPD Mother   . Hypertension Mother   . Emphysema Mother   . Osteoporosis Mother   . Heart disease Father   . Hyperlipidemia Sister   . Hyperlipidemia Brother   . Heart disease Maternal Grandmother   . Heart disease Maternal Grandfather   . Heart disease Paternal Grandmother   . Stroke Paternal Grandmother   . Heart disease Paternal Grandfather   . Stroke Paternal Grandfather   . Cancer Maternal Aunt        breast and lung  . Osteoporosis Maternal Aunt   . Aortic aneurysm Maternal Aunt   . Breast cancer Maternal Aunt 76    Social History:  reports that she quit smoking about 40 years ago. She has never used smokeless tobacco. She reports that she does not drink alcohol and does not use drugs. She has not been exposed to radiations or toxins. She is  retired. She used to work at [employer]. Her husband is status post CVA.  She lives in [place]. The patient is alone today.   Allergies:  Allergies  Allergen Reactions  . Contrast Media [Iodinated Diagnostic Agents] Swelling    Eye swelling shut  . Other Swelling    Eye swelling shut IV contrast media   . Ace Inhibitors Other (See Comments)    "Makes me feel strange."  . Hctz [Hydrochlorothiazide] Other (See Comments)    arms ache  . Keflex [Cephalexin] Other (See Comments)    thrush    Current Medications: Current Outpatient Medications  Medication Sig Dispense Refill  . amLODipine (NORVASC) 5 MG tablet Take 1 tablet (5 mg total) by mouth daily. 90 tablet 4  . aspirin 81  MG tablet Take 81 mg by mouth daily.    . Biotin 5000 MCG TABS Take 5,000 mcg by mouth 2 (two) times daily.    . Calcium Carb-Cholecalciferol (CALCIUM-VITAMIN D) 600-400 MG-UNIT TABS Take 1 tablet by mouth 2 (two) times daily.    . cholecalciferol (VITAMIN D3) 25 MCG (1000 UNIT) tablet Take 1,000 Units by mouth in the morning and at bedtime.    . Cyanocobalamin (B-12) 2500 MCG TABS Take 2,500 mcg by mouth daily.    Marland Kitchen ibuprofen (ADVIL) 200 MG tablet Take 400 mg by mouth every 6 (six) hours as needed for moderate pain.     Marland Kitchen letrozole (FEMARA) 2.5 MG tablet Take 1 tablet (2.5 mg total) by mouth daily. 30 tablet 1  . metoprolol succinate (TOPROL-XL) 25 MG 24 hr tablet Take 1 tablet (25 mg total) by mouth daily. 90 tablet 4  . pantoprazole (PROTONIX) 40 MG tablet Take 1 tablet (40 mg total) by mouth 2 (two) times daily. 180 tablet 4  . Red Yeast Rice Extract (RED YEAST RICE PO) Take 1 tablet by mouth in the morning and at bedtime.    . St Johns Wort 300 MG CAPS Take 300 mg by mouth 2 (two) times daily.    . tamoxifen (NOLVADEX) 20 MG tablet Take 1 tablet (20 mg total) by mouth daily. 90 tablet 1  . valsartan (DIOVAN) 160 MG tablet Take 1 tablet (160 mg total) by mouth daily. 90 tablet 4  . HYDROcodone-acetaminophen (NORCO) 5-325 MG tablet Take 1 tablet by mouth every 6 (six) hours as needed for up to 6 doses for moderate pain. (Patient not taking: No sig reported) 6 tablet 0   No current facility-administered medications for this visit.    Review of Systems  Constitutional: Negative for chills, diaphoresis, fever, malaise/fatigue and weight loss (up 4 lbs).       Feels "good."  HENT: Negative for congestion, ear discharge, ear pain, hearing loss, nosebleeds, sinus pain, sore throat and tinnitus.   Eyes: Negative for blurred vision.  Respiratory: Positive for shortness of breath (on exertion, stable). Negative for cough, hemoptysis, sputum production and wheezing.        Sleep apnea.   Cardiovascular: Negative for chest pain, palpitations, orthopnea and leg swelling.  Gastrointestinal: Negative for abdominal pain, blood in stool, constipation, diarrhea, heartburn, melena, nausea and vomiting.       Eating well.  Genitourinary: Negative for dysuria, flank pain, frequency, hematuria and urgency.  Musculoskeletal: Negative for back pain, joint pain, myalgias and neck pain.  Skin:       Itchy rash underneath breast comes and goes.  Neurological: Negative for dizziness, tingling, sensory change, weakness and headaches.  Endo/Heme/Allergies: Does not  bruise/bleed easily.  Psychiatric/Behavioral: Negative for depression and memory loss. The patient is not nervous/anxious and does not have insomnia.   All other systems reviewed and are negative.  Performance status (ECOG): 1  Vitals Blood pressure 134/74, pulse 76, temperature (!) 96.9 F (36.1 C), temperature source Tympanic, resp. rate 16, weight 225 lb 8.5 oz (102.3 kg), SpO2 98 %.   Physical Exam Vitals and nursing note reviewed.  Constitutional:      Appearance: Normal appearance. She is normal weight.  HENT:     Head:     Comments: Short styled blonde hair. Eyes:     General: No scleral icterus.    Conjunctiva/sclera: Conjunctivae normal.     Comments: Glasses.  Neurological:     Mental Status: She is oriented to person, place, and time.  Psychiatric:        Behavior: Behavior normal.        Thought Content: Thought content normal.        Judgment: Judgment normal.    Appointment on 05/10/2020  Component Date Value Ref Range Status  . Total Protein 05/10/2020 7.1  6.5 - 8.1 g/dL Final  . Albumin 05/10/2020 3.6  3.5 - 5.0 g/dL Final  . AST 05/10/2020 21  15 - 41 U/L Final  . ALT 05/10/2020 15  0 - 44 U/L Final  . Alkaline Phosphatase 05/10/2020 72  38 - 126 U/L Final  . Total Bilirubin 05/10/2020 0.4  0.3 - 1.2 mg/dL Final  . Bilirubin, Direct 05/10/2020 <0.1  0.0 - 0.2 mg/dL Final  . Indirect Bilirubin  05/10/2020 NOT CALCULATED  0.3 - 0.9 mg/dL Final   Performed at [facility], [address], [place], [place] [address]    Assessment:  Melissa Merritt is a 70 y.o. female  with stage IA right breast cancer s/p partial mastectomy and right axillary sentinel lymph node biopsy on 08/13/2019.   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  There was atypical lobular hyperplasia, with ductal involvement.  Additional posterior margin revealed no evidence of residual invasive carcinoma or DCIS.  Closest margin for invasive carcinoma was > 10 mm and 2 mm for DCIS.  One lymph node was negative for malignancy.  Tumor was ER + (91-100%), PR+ (91-100%), and Her2/neu -.  Pathologic stage was pT1a pN0 (sn).  She underwent stereotactic biopsy on 07/17/2019.  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    She received 5040 cGy to her right breast and a 1400 cGy boost her scar from 09/28/2019- 11/17/2019.  She began tamoxifen on 11/27/2019. Tamoxifen was placed on hold on 03/28/2020 as it made her feel awful.  Bilateral diagnostic mammogram on 07/08/2019 revealed persistent architectural distortion involving the upper outer quadrant of the RIGHT breast without sonographic correlate.  There was no pathologic RIGHT axillary lymphadenopathy.  There was no mammographic or sonographic evidence of malignancy involving the LEFT breast.    CA27.29 has been followed (0-38.6): 19.0 on 07/24/2019, 18.9 on 10/15/2019, 19.7 on 12/28/2019, and 11.0 on 03/28/2020.  Bone density on 08/04/2019 revealed osteopenia with a T score of -1.5 in the AP spine L2-L4.  She has history of B12 deficiency.  B12 was 1412 on 05/10/2020.  She is on oral B12  3 days/week. She has folate deficiency.  Folate was 5.2 on 03/28/2020 and 32.0 on 05/10/2020.  She began  folic acid 1  mg a day on 03/28/2020. She has iron deficiency.  Ferritin was 100.  Iron saturation was 9% with a TIBC of 248 on 03/28/2020.  Sed rate was 56 on 04/12/2020.  She is on oral iron.  She has CHF and sleep apnea.  Echo on 07/02/2019 revealed an EF of 45%.  She is scheduled to have a root canal and a crown.  She received her last Moderna COVID-19 vaccine at the end or 06/2019.   Symptomatically, she feels "good."  She is tolerating Femara well without side effects.   Plan: 1.   Labs today: LFTs, B12. folate level. 2.   Stage IA right breast cancer             She is s/p lumpectomy and SLN biopsy.             Pathology revealed a 4 mm grade 1 tumor.                         Tumor is ER and PR positive and HER-2/neu negative.             Patient completed radiation therapy on 11/17/2019.             She was on tamoxifen from 11/27/2019 - 03/28/2020.   Tamoxifen was discontinued secondary to multiple symptoms.   She began Femara on 04/12/2020.   She is tolerating it well without side effect.  Continue Femara. 3.   Osteopenia             She has a T score of -1.5 in the AP spine.  Continue calcium and vitamin D.  She is interested in Prolia.    Await dental clearance. 4.   Iron deficiency anemia  Hematocrit 34.8.  Hemoglobin 11.0.  MCV 87.0 on 04/12/2020.   Ferritin 100 with an iron saturation of 9% and a TIBC of 248 on 03/28/2020.  Ferritin is falsely elevated secondary to high sed rate (56).  She is on oral iron (began 03/28/2020). 5.   B12 deficiency  B12 was 6729 (high) on 03/28/2020 and 1412 on 05/10/2020.  She was instructed to hold B12 x 3 weeks then begin B12 3 days a week.  She is currently taking B12 3 days a week.  Continue to monitor. 6.   Folate deficiency  Folate was 5.2 on 03/28/2020.  She began folic acid 1 mg a day on 03/28/2020  Folate was 32.0 on 05/10/2020 (available after clinic).   Decrease folate to 3 days/week.  Continue to monitor. 7.   Patient to contact  dentist regarding clearance for Prolia.  When cleared, schedule labs (BMP) and Prolia. 8.   RN:  Call patient with B12 and folate level. 9.   Bilateral mammogram on 07/07/2020. 10.   RTC in 3 months for MD assessment and labs (CBC with diff,CMP, folate, CA27.29).  I discussed the assessment and treatment plan with the patient.  The patient was provided an opportunity to ask questions and all were answered.  The patient agreed with the plan and demonstrated an understanding of the instructions.  The patient was advised to call back if the symptoms worsen or if the condition fails to improve as anticipated.   Lequita Asal, MD, PhD 05/10/2020, 2:28 PM   I, Mirian Mo Tufford, am acting as a Education administrator for [employer]. Mike Gip, MD.   I, Melissa C. Mike Gip, MD, have reviewed the above documentation for accuracy  and completeness, and I agree with the above. I, Nic Lampe C. Mike Gip, MD, have reviewed the above documentation for accuracy and completeness, and I agree with the above.

## 2020-05-10 ENCOUNTER — Encounter: Payer: Self-pay | Admitting: Hematology and Oncology

## 2020-05-10 ENCOUNTER — Other Ambulatory Visit: Payer: Self-pay

## 2020-05-10 ENCOUNTER — Inpatient Hospital Stay: Payer: Medicare Other

## 2020-05-10 ENCOUNTER — Inpatient Hospital Stay: Payer: Medicare Other | Attending: Hematology and Oncology | Admitting: Hematology and Oncology

## 2020-05-10 ENCOUNTER — Telehealth: Payer: Self-pay

## 2020-05-10 VITALS — BP 134/74 | HR 76 | Temp 96.9°F | Resp 16 | Wt 225.5 lb

## 2020-05-10 DIAGNOSIS — Z79811 Long term (current) use of aromatase inhibitors: Secondary | ICD-10-CM | POA: Diagnosis not present

## 2020-05-10 DIAGNOSIS — I110 Hypertensive heart disease with heart failure: Secondary | ICD-10-CM | POA: Diagnosis not present

## 2020-05-10 DIAGNOSIS — Z87891 Personal history of nicotine dependence: Secondary | ICD-10-CM | POA: Insufficient documentation

## 2020-05-10 DIAGNOSIS — C50411 Malignant neoplasm of upper-outer quadrant of right female breast: Secondary | ICD-10-CM | POA: Diagnosis not present

## 2020-05-10 DIAGNOSIS — M8580 Other specified disorders of bone density and structure, unspecified site: Secondary | ICD-10-CM | POA: Diagnosis not present

## 2020-05-10 DIAGNOSIS — Z923 Personal history of irradiation: Secondary | ICD-10-CM | POA: Insufficient documentation

## 2020-05-10 DIAGNOSIS — D509 Iron deficiency anemia, unspecified: Secondary | ICD-10-CM | POA: Insufficient documentation

## 2020-05-10 DIAGNOSIS — Z170 Estrogen receptor positive status [ER+]: Secondary | ICD-10-CM

## 2020-05-10 DIAGNOSIS — R0602 Shortness of breath: Secondary | ICD-10-CM | POA: Diagnosis not present

## 2020-05-10 DIAGNOSIS — G4730 Sleep apnea, unspecified: Secondary | ICD-10-CM | POA: Insufficient documentation

## 2020-05-10 DIAGNOSIS — R21 Rash and other nonspecific skin eruption: Secondary | ICD-10-CM | POA: Diagnosis not present

## 2020-05-10 DIAGNOSIS — I509 Heart failure, unspecified: Secondary | ICD-10-CM | POA: Insufficient documentation

## 2020-05-10 DIAGNOSIS — E538 Deficiency of other specified B group vitamins: Secondary | ICD-10-CM

## 2020-05-10 DIAGNOSIS — C50911 Malignant neoplasm of unspecified site of right female breast: Secondary | ICD-10-CM | POA: Diagnosis not present

## 2020-05-10 LAB — HEPATIC FUNCTION PANEL
ALT: 15 U/L (ref 0–44)
AST: 21 U/L (ref 15–41)
Albumin: 3.6 g/dL (ref 3.5–5.0)
Alkaline Phosphatase: 72 U/L (ref 38–126)
Bilirubin, Direct: <0.1 mg/dL (ref 0.0–0.2)
Total Bilirubin: 0.4 mg/dL (ref 0.3–1.2)
Total Protein: 7.1 g/dL (ref 6.5–8.1)

## 2020-05-10 LAB — FOLATE: Folate: 32 ng/mL (ref >5.9)

## 2020-05-10 LAB — VITAMIN B12: Vitamin B-12: 1412 pg/mL — ABNORMAL HIGH (ref 180–914)

## 2020-05-10 NOTE — Patient Instructions (Signed)
  Contact dentist regarding clearance for Prolia (denosumab).

## 2020-05-10 NOTE — Progress Notes (Signed)
Patient states no new concerns. 

## 2020-05-10 NOTE — Telephone Encounter (Signed)
I called to inform patient that based on lab work that her b12 was better and we will check b12 again at next lab appointment.

## 2020-05-16 ENCOUNTER — Telehealth: Payer: Self-pay

## 2020-05-16 NOTE — Telephone Encounter (Signed)
Patient called and stated Dr.Bell gave her a verbal okay for the prolia injections. I called the office to get a clearance faxed over fr our records.

## 2020-05-25 ENCOUNTER — Other Ambulatory Visit: Payer: Self-pay

## 2020-05-25 ENCOUNTER — Ambulatory Visit (INDEPENDENT_AMBULATORY_CARE_PROVIDER_SITE_OTHER): Payer: Medicare Other | Admitting: Nurse Practitioner

## 2020-05-25 ENCOUNTER — Encounter: Payer: Self-pay | Admitting: Nurse Practitioner

## 2020-05-25 VITALS — BP 138/70 | HR 69 | Temp 98.5°F | Ht 66.93 in | Wt 224.0 lb

## 2020-05-25 DIAGNOSIS — I10 Essential (primary) hypertension: Secondary | ICD-10-CM

## 2020-05-25 DIAGNOSIS — M8588 Other specified disorders of bone density and structure, other site: Secondary | ICD-10-CM

## 2020-05-25 DIAGNOSIS — C50411 Malignant neoplasm of upper-outer quadrant of right female breast: Secondary | ICD-10-CM

## 2020-05-25 DIAGNOSIS — I519 Heart disease, unspecified: Secondary | ICD-10-CM

## 2020-05-25 DIAGNOSIS — Z6835 Body mass index (BMI) 35.0-35.9, adult: Secondary | ICD-10-CM | POA: Diagnosis not present

## 2020-05-25 DIAGNOSIS — K219 Gastro-esophageal reflux disease without esophagitis: Secondary | ICD-10-CM

## 2020-05-25 DIAGNOSIS — G4733 Obstructive sleep apnea (adult) (pediatric): Secondary | ICD-10-CM | POA: Diagnosis not present

## 2020-05-25 DIAGNOSIS — Z170 Estrogen receptor positive status [ER+]: Secondary | ICD-10-CM | POA: Diagnosis not present

## 2020-05-25 DIAGNOSIS — R7301 Impaired fasting glucose: Secondary | ICD-10-CM | POA: Diagnosis not present

## 2020-05-25 DIAGNOSIS — E782 Mixed hyperlipidemia: Secondary | ICD-10-CM

## 2020-05-25 DIAGNOSIS — J4530 Mild persistent asthma, uncomplicated: Secondary | ICD-10-CM | POA: Diagnosis not present

## 2020-05-25 DIAGNOSIS — J449 Chronic obstructive pulmonary disease, unspecified: Secondary | ICD-10-CM | POA: Insufficient documentation

## 2020-05-25 NOTE — Assessment & Plan Note (Signed)
Chronic, with BP at goal today in office and on home readings.  Continue current medication regimen and adjust as needed + collaboration with cardiology.  Continue to monitor BP at home regularly and document + focus on DASH diet.  CMP up to date with oncology.  Return in 6 months.

## 2020-05-25 NOTE — Assessment & Plan Note (Signed)
BMI 35.16.  Recommended eating smaller high protein, low fat meals more frequently and exercising 30 mins a day 5 times a week with a goal of 10-15lb weight loss in the next 3 months. Patient voiced their understanding and motivation to adhere to these recommendations.

## 2020-05-25 NOTE — Patient Instructions (Signed)

## 2020-05-25 NOTE — Assessment & Plan Note (Signed)
A1C today for recheck.

## 2020-05-25 NOTE — Assessment & Plan Note (Signed)
No current inhalers, continue collaboration with pulmonary.

## 2020-05-25 NOTE — Assessment & Plan Note (Signed)
Noted on DEXA 08/04/19 -- continue Vitamin D daily and recommend repeat DEXA in 5 years.

## 2020-05-25 NOTE — Assessment & Plan Note (Signed)
Chronic, ongoing.  Again reviewed ASCVD score with patient and discussed risks, she refuses statin, does not wish to take.  Continue red yeast rice and obtain labs today.  Return in 6 months. 

## 2020-05-25 NOTE — Assessment & Plan Note (Signed)
Noted on echo, continue collaboration with cardiology and current medication regimen. 

## 2020-05-25 NOTE — Assessment & Plan Note (Signed)
Chronic, stable with Protonix.  Continue current medication regimen.  Mag level next visit and annually.  Trial time off in future and assess if tolerated.

## 2020-05-25 NOTE — Progress Notes (Signed)
BP 138/70   Pulse 69   Temp 98.5 F (36.9 C) (Oral)   Ht 5' 6.93" (1.7 m)   Wt 224 lb (101.6 kg)   SpO2 97%   BMI 35.16 kg/m    Subjective:    Patient ID: Melissa Merritt, female    DOB: 10-20-1950, 70 y.o.   MRN: [id number]  HPI: Melissa Merritt is a 70 y.o. female  Chief Complaint  Patient presents with  . Breast Cancer  . Hypertension  . Hyperlipidemia   HYPERTENSION / HYPERLIPIDEMIA Taking Valsartan, Metoprolol, Amlodipine, ASA. Followed by Dr. Josefa Half, last sawin his office 11/11/19.Last echo 07/02/19 with normal LV function with LVEF 45-50% with mild reduced left ventricular function which is consistent with previous echos dating back to 2013.Refuses statin therapy, even after at length discussion about risk reduction. Is taking red yeast rice. Satisfied with current treatment?yes Duration of hypertension:chronic BP monitoring frequency:a few times a week BP range:130/70's at home BP medication side effects:no Duration of hyperlipidemia:chronic Cholesterol medication side effects:no Cholesterol supplements: red yeast rice Medication compliance:good compliance Aspirin:yes Recent stressors:no Recurrent headaches:no Visual changes:no Palpitations:no Dyspnea:occasional Chest pain:no Lower extremity edema:no Dizzy/lightheaded:no The 10-year ASCVD risk score [name], et al., 2013) is: 16.3%   Values used to calculate the score:     Age: 57 years     Sex: Female     Is Non-Hispanic African American: No     Diabetic: No     Tobacco smoker: No     Systolic Blood Pressure: 132 mmHg     Is BP treated: Yes     HDL Cholesterol: 54 mg/dL     Total Cholesterol: 207 mg/dL  BREAST CANCER: Being followed by oncology and receiving radiation treatment. Finished treatments 11/16/2019, continues to be followed by oncology every 3 months, last visit 05/10/2020.  She was taken off Tamoxifen -- caused multiple side effects.  Was changed to Femara,  has been on 2 months, currently doing well with this.  History of osteopenia on DEXA last year with oncology.  SLEEP APNEA/ASTHMA Had sleep study several years ago, did have study recently in February and is to have follow-up with machine. Saw pulmonary last on 12/23/19 for asthma -- no current inhalers.  Never received CPAP, never got around to calling as is caring for her husband who had stroke May 2021 and then shoulder surgery in beginning of December. Sleep apnea status:uncontrolled Duration:chronic Satisfied with current treatment?:yes CPAP use:no Last sleep study:February and March 2021 Treatments attempted:none Wakes feeling refreshed:no Daytime hypersomnolence:yes Fatigue:yes Insomnia:yes Good sleep hygiene:yes Difficulty falling asleep:yes Difficulty staying asleep:yes Snoring bothers bed partner:yes Observed apnea by bed partner:yes Obesity:yes Hypertension:yes Pulmonary hypertension:no Coronary artery disease:no  GERD Continues on Protonix. GERD control status:stable Satisfied with current treatment?yes Heartburn frequency:none Medication side effects:no Medication compliance:stable Previous GERD medications:TUMS Antacid use frequency:none Alleviatiating factors:Protonix Aggravating factors:certain foods Dysphagia:no Odynophagia:no Hematemesis:no Blood in stool:no EGD:yes  IFG Recent labs showed glucose in 150 range with oncology. Polydipsia/polyuria: no Visual disturbance: no Chest pain: no Paresthesias: no  Relevant past medical, surgical, family and social history reviewed and updated as indicated. Interim medical history since our last visit reviewed. Allergies and medications reviewed and updated.  Review of Systems  Constitutional: Negative for activity change, appetite change, diaphoresis, fatigue and fever.  Respiratory: Negative for cough, chest tightness and shortness of breath.    Cardiovascular: Negative for chest pain, palpitations and leg swelling.  Gastrointestinal: Negative.   Endocrine: Negative.   Psychiatric/Behavioral: Negative.  Per HPI unless specifically indicated above     Objective:    BP 138/70   Pulse 69   Temp 98.5 F (36.9 C) (Oral)   Ht 5' 6.93" (1.7 m)   Wt 224 lb (101.6 kg)   SpO2 97%   BMI 35.16 kg/m   Wt Readings from Last 3 Encounters:  05/25/20 224 lb (101.6 kg)  05/10/20 225 lb 8.5 oz (102.3 kg)  04/12/20 221 lb 12.5 oz (100.6 kg)    Physical Exam Vitals and nursing note reviewed.  Constitutional:      General: She is awake. She is not in acute distress.    Appearance: She is well-developed. She is obese. She is not ill-appearing.  HENT:     Head: Normocephalic.     Right Ear: Hearing normal.     Left Ear: Hearing normal.  Eyes:     General: Lids are normal.        Right eye: No discharge.        Left eye: No discharge.     Conjunctiva/sclera: Conjunctivae normal.     Pupils: Pupils are equal, round, and reactive to light.  Neck:     Thyroid: No thyromegaly.     Vascular: No carotid bruit.  Cardiovascular:     Rate and Rhythm: Normal rate and regular rhythm.     Heart sounds: Normal heart sounds. No murmur heard. No gallop.   Pulmonary:     Effort: Pulmonary effort is normal. No accessory muscle usage or respiratory distress.     Breath sounds: Normal breath sounds.  Abdominal:     General: Bowel sounds are normal.     Palpations: Abdomen is soft.  Musculoskeletal:     Cervical back: Normal range of motion and neck supple.     Right lower leg: No edema.     Left lower leg: No edema.  Skin:    General: Skin is warm and dry.  Neurological:     Mental Status: She is alert and oriented to person, place, and time.  Psychiatric:        Attention and Perception: Attention normal.        Mood and Affect: Mood normal.        Speech: Speech normal.        Behavior: Behavior normal. Behavior is cooperative.     Results for orders placed or performed in visit on 05/10/20  Folate  Result Value Ref Range   Folate 32.0 >5.9 ng/mL  Hepatic function panel  Result Value Ref Range   Total Protein 7.1 6.5 - 8.1 g/dL   Albumin 3.6 3.5 - 5.0 g/dL   AST 21 15 - 41 U/L   ALT 15 0 - 44 U/L   Alkaline Phosphatase 72 38 - 126 U/L   Total Bilirubin 0.4 0.3 - 1.2 mg/dL   Bilirubin, Direct <0.1 0.0 - 0.2 mg/dL   Indirect Bilirubin NOT CALCULATED 0.3 - 0.9 mg/dL  Vitamin B12  Result Value Ref Range   Vitamin B-12 1,412 (H) 180 - 914 pg/mL      Assessment & Plan:   Problem List Items Addressed This Visit      Cardiovascular and Mediastinum   Essential hypertension    Chronic, with BP at goal today in office and on home readings.  Continue current medication regimen and adjust as needed + collaboration with cardiology.  Continue to monitor BP at home regularly and document + focus on DASH diet.  CMP up to  date with oncology.  Return in 6 months.      Left ventricular dysfunction    Noted on echo, continue collaboration with cardiology and current medication regimen.        Respiratory   Obstructive apnea    Recommend she utilize CPAP 100% if prescribed -- recommend she obtain this.      Mild persistent asthma    No current inhalers, continue collaboration with pulmonary.        Digestive   GERD (gastroesophageal reflux disease)    Chronic, stable with Protonix.  Continue current medication regimen.  Mag level next visit and annually.  Trial time off in future and assess if tolerated.        Endocrine   IFG (impaired fasting glucose)    A1C today for recheck.      Relevant Orders   HgB A1c     Musculoskeletal and Integument   Osteopenia of lumbar spine    Noted on DEXA 08/04/19 -- continue Vitamin D daily and recommend repeat DEXA in 5 years.        Other   Hyperlipidemia    Chronic, ongoing.  Again reviewed ASCVD score with patient and discussed risks, she refuses statin,  does not wish to take.  Continue red yeast rice and obtain labs today.  Return in 6 months.      Relevant Orders   Lipid Panel w/o Chol/HDL Ratio   Obesity    BMI 35.16.  Recommended eating smaller high protein, low fat meals more frequently and exercising 30 mins a day 5 times a week with a goal of 10-15lb weight loss in the next 3 months. Patient voiced their understanding and motivation to adhere to these recommendations.       Malignant neoplasm of right breast in female, estrogen receptor positive ([place]) - Primary    Ongoing, continue collaboration with oncology.  Recent notes reviewed.      Relevant Medications   folic acid (FOLVITE) 102 MCG tablet       Follow up plan: Return in about 6 months (around 11/22/2020) for HTN/HLD, BREAST CA, GERD, OSA.

## 2020-05-25 NOTE — Assessment & Plan Note (Signed)
Ongoing, continue collaboration with oncology.  Recent notes reviewed. 

## 2020-05-25 NOTE — Assessment & Plan Note (Signed)
Recommend she utilize CPAP 100% if prescribed -- recommend she obtain this.

## 2020-05-26 LAB — LIPID PANEL W/O CHOL/HDL RATIO
Cholesterol, Total: 227 mg/dL — ABNORMAL HIGH (ref 100–199)
HDL: 55 mg/dL (ref >39)
LDL Chol Calc (NIH): 150 mg/dL — ABNORMAL HIGH (ref 0–99)
Triglycerides: 124 mg/dL (ref 0–149)
VLDL Cholesterol Cal: 22 mg/dL (ref 5–40)

## 2020-05-26 LAB — HEMOGLOBIN A1C
Est. average glucose Bld gHb Est-mCnc: 114 mg/dL
Hgb A1c MFr Bld: 5.6 % (ref 4.8–5.6)

## 2020-05-26 NOTE — Progress Notes (Signed)
Please let Brittania know her labs have returned. A1c continues to show no prediabetes or diabetes.  Great news!!  Cholesterol levels continue to be elevated with LDL 150 this check, a little higher. I do continue to recommend starting a statin to help lower these and prevent stroke.  If she wishes to try a low dose let me know.  Have a great day!! Keep being awesome!!  Thank you for allowing me to participate in your care. Kindest regards, Emmi Wertheim

## 2020-05-30 ENCOUNTER — Other Ambulatory Visit: Payer: Medicare Other

## 2020-05-30 ENCOUNTER — Ambulatory Visit (INDEPENDENT_AMBULATORY_CARE_PROVIDER_SITE_OTHER): Payer: Medicare Other

## 2020-05-30 VITALS — Ht 67.0 in | Wt 224.0 lb

## 2020-05-30 DIAGNOSIS — Z0000 Encounter for general adult medical examination without abnormal findings: Secondary | ICD-10-CM | POA: Diagnosis not present

## 2020-05-30 NOTE — Progress Notes (Signed)
I connected with Melissa Merritt today by telephone and verified that I am speaking with the correct person using two identifiers. Location patient: home Location provider: work Persons participating in the virtual visit: Erina Hamme, Glenna Durand LPN.   I discussed the limitations, risks, security and privacy concerns of performing an evaluation and management service by telephone and the availability of in person appointments. I also discussed with the patient that there may be a patient responsible charge related to this service. The patient expressed understanding and verbally consented to this telephonic visit.    Interactive audio and video telecommunications were attempted between this provider and patient, however failed, due to patient having technical difficulties OR patient did not have access to video capability.  We continued and completed visit with audio only.     Vital signs may be patient reported or missing.  Subjective:   Melissa Merritt is a 70 y.o. female who presents for Medicare Annual (Subsequent) preventive examination.  Review of Systems     Cardiac Risk Factors include: advanced age (>59men, >54 women);hypertension;obesity (BMI >30kg/m2);sedentary lifestyle     Objective:    Today's Vitals   05/30/20 0856  Weight: 224 lb (101.6 kg)  Height: 5\' 7"  (1.702 m)   Body mass index is 35.08 kg/m.  Advanced Directives 05/30/2020 05/10/2020 04/12/2020 03/28/2020 12/28/2019 11/26/2019 09/10/2019  Does Patient Have a Medical Advance Directive? Yes Yes Yes Yes No Yes Yes  Type of Paramedic of [place];Living will [place];Living will [place];Living will [place];Living will - [place];Living will [place];Living will  Does patient want to make changes to medical advance directive? - Yes (ED - Information included in AVS) Yes (ED - Information included  in AVS) Yes (ED - Information included in AVS) - Yes (MAU/Ambulatory/Procedural Areas - Information given) -  Copy of [place] in Chart? Yes - validated most recent copy scanned in chart (See row information) - - - - - No - copy requested  Would patient like information on creating a medical advance directive? - - - - No - Patient declined - -    Current Medications (verified) Outpatient Encounter Medications as of 05/30/2020  Medication Sig  . amLODipine (NORVASC) 5 MG tablet Take 1 tablet (5 mg total) by mouth daily.  Marland Kitchen aspirin 81 MG tablet Take 81 mg by mouth daily.  . Biotin 5000 MCG TABS Take 5,000 mcg by mouth 2 (two) times daily.  . Calcium Carb-Cholecalciferol (CALCIUM-VITAMIN D) 600-400 MG-UNIT TABS Take 1 tablet by mouth 2 (two) times daily.  . cholecalciferol (VITAMIN D3) 25 MCG (1000 UNIT) tablet Take 1,000 Units by mouth in the morning and at bedtime.  . Cyanocobalamin (B-12) 2500 MCG TABS Take 2,500 mcg by mouth daily.  . ferrous sulfate 220 (44 Fe) MG/5ML solution Take 220 mg by mouth daily.  . folic acid (FOLVITE) 160 MCG tablet Take 400 mcg by mouth daily.  Marland Kitchen ibuprofen (ADVIL) 200 MG tablet Take 400 mg by mouth every 6 (six) hours as needed for moderate pain.   Marland Kitchen letrozole (FEMARA) 2.5 MG tablet Take 1 tablet (2.5 mg total) by mouth daily.  . metoprolol succinate (TOPROL-XL) 25 MG 24 hr tablet Take 1 tablet (25 mg total) by mouth daily.  . pantoprazole (PROTONIX) 40 MG tablet Take 1 tablet (40 mg total) by mouth 2 (two) times daily.  . Red Yeast Rice Extract (RED YEAST RICE PO)  Take 1 tablet by mouth in the morning and at bedtime.  . St Johns Wort 300 MG CAPS Take 300 mg by mouth 2 (two) times daily.  . valsartan (DIOVAN) 160 MG tablet Take 1 tablet (160 mg total) by mouth daily.   No facility-administered encounter medications on file as of 05/30/2020.    Allergies (verified) Contrast media [iodinated diagnostic agents], Other, Ace inhibitors, Hctz  [hydrochlorothiazide], and Keflex [cephalexin]   History: Past Medical History:  Diagnosis Date  . Benign tumor of lung   . Cancer [facility])    right breast  . CHF (congestive heart failure) ([place])   . Dyspnea   . GERD (gastroesophageal reflux disease)   . Hyperlipidemia   . Hypertension   . Insomnia   . Osteopenia   . Plantar fasciitis   . PONV (postoperative nausea and vomiting)    vomiting 2 days after lung surgery  . S/P pneumonectomy    right lower lobe  . Urinary incontinence    Past Surgical History:  Procedure Laterality Date  . BREAST BIOPSY Right 07/17/2019   Affirm bx-"Ribbon" clip-path pending  . bronchoscopy    . CATARACT EXTRACTION, BILATERAL     Left- 11/21/15, right- 12/23/15  . CHOLECYSTECTOMY    . CYSTECTOMY  11/03/09   cheek  . dilation and curretage    . EYE SURGERY Bilateral    cataract  . FOOT SURGERY Bilateral 2010  . LOBECTOMY    . LUNG SURGERY Right   . PART MASTECTOMY,RADIO FREQUENCY LOCALIZER,AXILLARY SENTINEL NODE BIOPSY Right 08/13/2019   Procedure: PART MASTECTOMY,RADIO FREQUENCY LOCALIZER,AXILLARY SENTINEL NODE BIOPSY;  Surgeon: Benjamine Sprague, DO;  Location: [facility] ORS;  Service: General;  Laterality: Right;  . UPPER GI ENDOSCOPY  2005   Family History  Problem Relation Age of Onset  . COPD Mother   . Hypertension Mother   . Emphysema Mother   . Osteoporosis Mother   . Heart disease Father   . Hyperlipidemia Sister   . Hyperlipidemia Brother   . Heart disease Maternal Grandmother   . Heart disease Maternal Grandfather   . Heart disease Paternal Grandmother   . Stroke Paternal Grandmother   . Heart disease Paternal Grandfather   . Stroke Paternal Grandfather   . Cancer Maternal Aunt        breast and lung  . Osteoporosis Maternal Aunt   . Aortic aneurysm Maternal Aunt   . Breast cancer Maternal Aunt 76   Social History   Socioeconomic History  . Marital status: Married    Spouse name: Not on file  . Number of children: Not on file   . Years of education: Not on file  . Highest education level: Some college, no degree  Occupational History  . Occupation: retired   Tobacco Use  . Smoking status: Former Smoker    Quit date: 07/09/1979    Years since quitting: 40.9  . Smokeless tobacco: Never Used  Vaping Use  . Vaping Use: Never used  Substance and Sexual Activity  . Alcohol use: No  . Drug use: No  . Sexual activity: Yes  Other Topics Concern  . Not on file  Social History Narrative  . Not on file   Social Determinants of Health   Financial Resource Strain: Low Risk   . Difficulty of Paying Living Expenses: Not hard at all  Food Insecurity: No Food Insecurity  . Worried About Charity fundraiser in the Last Year: Never true  . Ran Out of Food  in the Last Year: Never true  Transportation Needs: No Transportation Needs  . Lack of Transportation (Medical): No  . Lack of Transportation (Non-Medical): No  Physical Activity: Inactive  . Days of Exercise per Week: 0 days  . Minutes of Exercise per Session: 0 min  Stress: No Stress Concern Present  . Feeling of Stress : Not at all  Social Connections: Not on file    Tobacco Counseling Counseling given: Not Answered   Clinical Intake:  Pre-visit preparation completed: Yes  Pain : No/denies pain     Nutritional Status: BMI > 30  Obese Nutritional Risks: None Diabetes: No  How often do you need to have someone help you when you read instructions, pamphlets, or other written materials from your doctor or pharmacy?: 1 - Never What is the last grade level you completed in school?: some college  Diabetic? no  Interpreter Needed?: No  Information entered by :: NAllen LPN   Activities of Daily Living In your present state of health, do you have any difficulty performing the following activities: 05/30/2020 11/23/2019  Hearing? Y N  Comment sometimes -  Vision? N N  Difficulty concentrating or making decisions? N N  Walking or climbing stairs? N N   Comment - -  Dressing or bathing? N N  Doing errands, shopping? N N  Preparing Food and eating ? N -  Using the Toilet? N -  In the past six months, have you accidently leaked urine? Y -  Comment uterine prolapsed -  Do you have problems with loss of bowel control? N -  Managing your Medications? N -  Managing your Finances? N -  Housekeeping or managing your Housekeeping? N -  Some recent data might be hidden    Patient Care Team: Venita Lick, NP as PCP - General (Nurse Practitioner) Idelle Leech, OD (Optometry) Isaias Cowman, MD as Consulting Physician (Cardiology) Manya Silvas, MD (Inactive) (Gastroenterology) Lucky Cowboy Erskine Squibb, MD as Referring Physician (Vascular Surgery) Clabe Seal, PA-C Jannet Mantis, MD (Dermatology) Theodore Demark, RN as Oncology Nurse Navigator Lequita Asal, MD as Referring Physician (Hematology and Oncology) Benjamine Sprague, DO as Consulting Physician (Surgery) Noreene Filbert, MD as Radiation Oncologist (Radiation Oncology)  Indicate any recent Medical Services you may have received from other than [facility] providers in the past year (date may be approximate).     Assessment:   This is a routine wellness examination for Lynsie.  Hearing/Vision screen  Hearing Screening   125Hz  250Hz  500Hz  1000Hz  2000Hz  3000Hz  4000Hz  6000Hz  8000Hz   Right ear:           Left ear:           Vision Screening Comments: Regular eye exams, Dr. Matilde Sprang  Dietary issues and exercise activities discussed: Current Exercise Habits: The patient does not participate in regular exercise at present  Goals    . DIET - INCREASE WATER INTAKE     Recommend drinking at least 6-8 glasses of water a day     . Patient Stated     05/30/2020, wants to weigh 180 pounds      Depression Screen PHQ 2/9 Scores 05/30/2020 05/25/2020 11/23/2019 05/25/2019 05/19/2018 05/17/2017 05/23/2016  PHQ - 2 Score 0 0 0 0 0 0 0  PHQ- 9 Score - - - - - - 1    Fall Risk Fall Risk   05/30/2020 05/25/2020 11/23/2019 05/25/2019 05/19/2018  Falls in the past year? 0 0 0 0 0  Number falls in past yr: 0 - 0 0 0  Injury with Fall? - - 0 0 -  Risk for fall due to : Medication side effect - No Fall Risks - -  Follow up Falls evaluation completed;Education provided;Falls prevention discussed - Falls evaluation completed - -    FALL RISK PREVENTION PERTAINING TO THE HOME:  Any stairs in or around the home? Yes  If so, are there any without handrails? No  Home free of loose throw rugs in walkways, pet beds, electrical cords, etc? Yes  Adequate lighting in your home to reduce risk of falls? Yes   ASSISTIVE DEVICES UTILIZED TO PREVENT FALLS:  Life alert? No  Use of a cane, walker or w/c? No  Grab bars in the bathroom? Yes  Shower chair or bench in shower? No  Elevated toilet seat or a handicapped toilet? Yes   TIMED UP AND GO:  Was the test performed? No .    Cognitive Function:     6CIT Screen 05/30/2020 05/25/2019 05/19/2018 05/17/2017  What Year? 0 points 0 points 0 points 0 points  What month? 0 points 0 points 0 points 0 points  What time? 0 points 0 points 0 points 0 points  Count back from 20 0 points 0 points 0 points 0 points  Months in reverse 0 points 0 points 0 points 0 points  Repeat phrase 0 points 0 points 0 points 0 points  Total Score 0 0 0 0    Immunizations Immunization History  Administered Date(s) Administered  . Fluad Quad(high Dose 65+) 02/24/2019  . Influenza, High Dose Seasonal PF 12/27/2015, 01/15/2018  . Influenza,inj,Quad PF,6+ Mos 01/21/2015  . Influenza-Unspecified 02/08/2017  . Moderna Sars-Covid-2 Vaccination 06/10/2019, 07/07/2019, 04/28/2020  . Pneumococcal Conjugate-13 05/19/2014  . Pneumococcal Polysaccharide-23 01/14/2008, 05/23/2015  . Td 09/11/2005  . Tdap 12/27/2015  . Zoster 09/04/2011    TDAP status: Up to date  Flu Vaccine status: Up to date  Pneumococcal vaccine status: Up to date  Covid-19 vaccine status:  Completed vaccines  Qualifies for Shingles Vaccine? Yes   Zostavax completed Yes   Shingrix Completed?: No.    Education has been provided regarding the importance of this vaccine. Patient has been advised to call insurance company to determine out of pocket expense if they have not yet received this vaccine. Advised may also receive vaccine at local pharmacy or Health Dept. Verbalized acceptance and understanding.  Screening Tests Health Maintenance  Topic Date Due  . COLONOSCOPY (Pts 45-94yrs Insurance coverage will need to be confirmed)  10/20/2018  . COVID-19 Vaccine (4 - Booster for Moderna series) 10/26/2020  . MAMMOGRAM  07/07/2021  . TETANUS/TDAP  12/26/2025  . INFLUENZA VACCINE  Completed  . DEXA SCAN  Completed  . Hepatitis C Screening  Completed  . PNA vac Low Risk Adult  Completed    Health Maintenance  Health Maintenance Due  Topic Date Due  . COLONOSCOPY (Pts 45-27yrs Insurance coverage will need to be confirmed)  10/20/2018    Colorectal cancer screening: patient to schedule  Mammogram status: scheduled 07/08/2020  Bone Density status: Completed 08/04/2019.   Lung Cancer Screening: (Low Dose CT Chest recommended if Age 34-80 years, 30 pack-year currently smoking OR have quit w/in 15years.) does not qualify.   Lung Cancer Screening Referral: no  Additional Screening:  Hepatitis C Screening: does qualify; Completed 12/27/2015  Vision Screening: Recommended annual ophthalmology exams for early detection of glaucoma and other disorders of the eye. Is the patient  up to date with their annual eye exam?  Yes  Who is the provider or what is the name of the office in which the patient attends annual eye exams? Dr. Matilde Sprang If pt is not established with a provider, would they like to be referred to a provider to establish care? No .   Dental Screening: Recommended annual dental exams for proper oral hygiene  Community Resource Referral / Chronic Care Management: CRR  required this visit?  No   CCM required this visit?  No      Plan:     I have personally reviewed and noted the following in the patient's chart:   . Medical and social history . Use of alcohol, tobacco or illicit drugs  . Current medications and supplements . Functional ability and status . Nutritional status . Physical activity . Advanced directives . List of other physicians . Hospitalizations, surgeries, and ER visits in previous 12 months . Vitals . Screenings to include cognitive, depression, and falls . Referrals and appointments  In addition, I have reviewed and discussed with patient certain preventive protocols, quality metrics, and best practice recommendations. A written personalized care plan for preventive services as well as general preventive health recommendations were provided to patient.     Kellie Simmering, LPN   [id number]   Nurse Notes:

## 2020-05-30 NOTE — Patient Instructions (Signed)
[name] , Thank you for taking time to come for your Medicare Wellness Visit. I appreciate your ongoing commitment to your health goals. Please review the following plan we discussed and let me know if I can assist you in the future.   Screening recommendations/referrals: Colonoscopy: patient to schedule Mammogram: scheduled 07/08/2020 Bone Density: completed 08/04/2019 Recommended yearly ophthalmology/optometry visit for glaucoma screening and checkup Recommended yearly dental visit for hygiene and checkup  Vaccinations: Influenza vaccine: completed 01/27/2020, due 11/07/2020 Pneumococcal vaccine: completed 05/23/2015 Tdap vaccine: completed 12/27/2015, due 12/26/2025 Shingles vaccine: discussed   Covid-19: 04/28/2020, 07/07/2019, 06/10/2019  Advanced directives: copy in chart  Conditions/risks identified: none  Next appointment: Follow up in one year for your annual wellness visit    Preventive Care 72 Years and Older, Female Preventive care refers to lifestyle choices and visits with your health care provider that can promote health and wellness. What does preventive care include?  A yearly physical exam. This is also called an annual well check.  Dental exams once or twice a year.  Routine eye exams. Ask your health care provider how often you should have your eyes checked.  Personal lifestyle choices, including:  Daily care of your teeth and gums.  Regular physical activity.  Eating a healthy diet.  Avoiding tobacco and drug use.  Limiting alcohol use.  Practicing safe sex.  Taking low-dose aspirin every day.  Taking vitamin and mineral supplements as recommended by your health care provider. What happens during an annual well check? The services and screenings done by your health care provider during your annual well check will depend on your age, overall health, lifestyle risk factors, and family history of disease. Counseling  Your health care provider may ask you  questions about your:  Alcohol use.  Tobacco use.  Drug use.  Emotional well-being.  Home and relationship well-being.  Sexual activity.  Eating habits.  History of falls.  Memory and ability to understand (cognition).  Work and work Statistician.  Reproductive health. Screening  You may have the following tests or measurements:  Height, weight, and BMI.  Blood pressure.  Lipid and cholesterol levels. These may be checked every 5 years, or more frequently if you are over 58 years old.  Skin check.  Lung cancer screening. You may have this screening every year starting at age 76 if you have a 30-pack-year history of smoking and currently smoke or have quit within the past 15 years.  Fecal occult blood test (FOBT) of the stool. You may have this test every year starting at age 51.  Flexible sigmoidoscopy or colonoscopy. You may have a sigmoidoscopy every 5 years or a colonoscopy every 10 years starting at age 75.  Hepatitis C blood test.  Hepatitis B blood test.  Sexually transmitted disease (STD) testing.  Diabetes screening. This is done by checking your blood sugar (glucose) after you have not eaten for a while (fasting). You may have this done every 1-3 years.  Bone density scan. This is done to screen for osteoporosis. You may have this done starting at age 85.  Mammogram. This may be done every 1-2 years. Talk to your health care provider about how often you should have regular mammograms. Talk with your health care provider about your test results, treatment options, and if necessary, the need for more tests. Vaccines  Your health care provider may recommend certain vaccines, such as:  Influenza vaccine. This is recommended every year.  Tetanus, diphtheria, and acellular pertussis (Tdap, Td)  vaccine. You may need a Td booster every 10 years.  Zoster vaccine. You may need this after age 57.  Pneumococcal 13-valent conjugate (PCV13) vaccine. One dose is  recommended after age 61.  Pneumococcal polysaccharide (PPSV23) vaccine. One dose is recommended after age 8. Talk to your health care provider about which screenings and vaccines you need and how often you need them. This information is not intended to replace advice given to you by your health care provider. Make sure you discuss any questions you have with your health care provider. Document Released: 04/22/2015 Document Revised: 12/14/2015 Document Reviewed: 01/25/2015 Elsevier Interactive Patient Education  2017 [employer] Prevention in the Home Falls can cause injuries. They can happen to people of all ages. There are many things you can do to make your home safe and to help prevent falls. What can I do on the outside of my home?  Regularly fix the edges of walkways and driveways and fix any cracks.  Remove anything that might make you trip as you walk through a door, such as a raised step or threshold.  Trim any bushes or trees on the path to your home.  Use bright outdoor lighting.  Clear any walking paths of anything that might make someone trip, such as rocks or tools.  Regularly check to see if handrails are loose or broken. Make sure that both sides of any steps have handrails.  Any raised decks and porches should have guardrails on the edges.  Have any leaves, snow, or ice cleared regularly.  Use sand or salt on walking paths during winter.  Clean up any spills in your garage right away. This includes oil or grease spills. What can I do in the bathroom?  Use night lights.  Install grab bars by the toilet and in the tub and shower. Do not use towel bars as grab bars.  Use non-skid mats or decals in the tub or shower.  If you need to sit down in the shower, use a plastic, non-slip stool.  Keep the floor dry. Clean up any water that spills on the floor as soon as it happens.  Remove soap buildup in the tub or shower regularly.  Attach bath mats  securely with double-sided non-slip rug tape.  Do not have throw rugs and other things on the floor that can make you trip. What can I do in the bedroom?  Use night lights.  Make sure that you have a light by your bed that is easy to reach.  Do not use any sheets or blankets that are too big for your bed. They should not hang down onto the floor.  Have a firm chair that has side arms. You can use this for support while you get dressed.  Do not have throw rugs and other things on the floor that can make you trip. What can I do in the kitchen?  Clean up any spills right away.  Avoid walking on wet floors.  Keep items that you use a lot in easy-to-reach places.  If you need to reach something above you, use a strong step stool that has a grab bar.  Keep electrical cords out of the way.  Do not use floor polish or wax that makes floors slippery. If you must use wax, use non-skid floor wax.  Do not have throw rugs and other things on the floor that can make you trip. What can I do with my stairs?  Do not leave  any items on the stairs.  Make sure that there are handrails on both sides of the stairs and use them. Fix handrails that are broken or loose. Make sure that handrails are as long as the stairways.  Check any carpeting to make sure that it is firmly attached to the stairs. Fix any carpet that is loose or worn.  Avoid having throw rugs at the top or bottom of the stairs. If you do have throw rugs, attach them to the floor with carpet tape.  Make sure that you have a light switch at the top of the stairs and the bottom of the stairs. If you do not have them, ask someone to add them for you. What else can I do to help prevent falls?  Wear shoes that:  Do not have high heels.  Have rubber bottoms.  Are comfortable and fit you well.  Are closed at the toe. Do not wear sandals.  If you use a stepladder:  Make sure that it is fully opened. Do not climb a closed  stepladder.  Make sure that both sides of the stepladder are locked into place.  Ask someone to hold it for you, if possible.  Clearly mark and make sure that you can see:  Any grab bars or handrails.  First and last steps.  Where the edge of each step is.  Use tools that help you move around (mobility aids) if they are needed. These include:  Canes.  Walkers.  Scooters.  Crutches.  Turn on the lights when you go into a dark area. Replace any light bulbs as soon as they burn out.  Set up your furniture so you have a clear path. Avoid moving your furniture around.  If any of your floors are uneven, fix them.  If there are any pets around you, be aware of where they are.  Review your medicines with your doctor. Some medicines can make you feel dizzy. This can increase your chance of falling. Ask your doctor what other things that you can do to help prevent falls. This information is not intended to replace advice given to you by your health care provider. Make sure you discuss any questions you have with your health care provider. Document Released: 01/20/2009 Document Revised: 09/01/2015 Document Reviewed: 04/30/2014 Elsevier Interactive Patient Education  2017 [employer].

## 2020-05-31 ENCOUNTER — Ambulatory Visit: Payer: Medicare Other | Admitting: Radiation Oncology

## 2020-06-02 DIAGNOSIS — Z23 Encounter for immunization: Secondary | ICD-10-CM | POA: Diagnosis not present

## 2020-06-02 DIAGNOSIS — I519 Heart disease, unspecified: Secondary | ICD-10-CM | POA: Diagnosis not present

## 2020-06-02 DIAGNOSIS — E6609 Other obesity due to excess calories: Secondary | ICD-10-CM | POA: Diagnosis not present

## 2020-06-02 DIAGNOSIS — I10 Essential (primary) hypertension: Secondary | ICD-10-CM | POA: Diagnosis not present

## 2020-06-02 DIAGNOSIS — Z6835 Body mass index (BMI) 35.0-35.9, adult: Secondary | ICD-10-CM | POA: Diagnosis not present

## 2020-06-02 DIAGNOSIS — R0602 Shortness of breath: Secondary | ICD-10-CM | POA: Diagnosis not present

## 2020-06-03 ENCOUNTER — Telehealth: Payer: Self-pay | Admitting: Nurse Practitioner

## 2020-06-03 NOTE — Telephone Encounter (Signed)
Pt is calling to an needs update rx for her cpap machine and supplies [facility] to [facility] [phone] and phone number [phone]

## 2020-06-03 NOTE — Telephone Encounter (Signed)
Called and left a message for Lovena Le at [facility] to give me a call back to see what is needed for patient.

## 2020-06-03 NOTE — Telephone Encounter (Signed)
Please assist and see what is needed for this from [facility], what they need [name] to send.  Thank you:)

## 2020-06-07 ENCOUNTER — Encounter: Payer: Self-pay | Admitting: Radiation Oncology

## 2020-06-08 ENCOUNTER — Other Ambulatory Visit: Payer: Self-pay

## 2020-06-08 ENCOUNTER — Ambulatory Visit
Admission: RE | Admit: 2020-06-08 | Discharge: 2020-06-08 | Disposition: A | Discharge status: Home / Self Care | Payer: Medicare Other | Source: Ambulatory Visit | Attending: Radiation Oncology | Admitting: Radiation Oncology

## 2020-06-08 ENCOUNTER — Encounter: Payer: Self-pay | Admitting: Radiation Oncology

## 2020-06-08 VITALS — BP 148/90 | HR 80 | Temp 97.5°F | Resp 18 | Wt 224.6 lb

## 2020-06-08 DIAGNOSIS — C50411 Malignant neoplasm of upper-outer quadrant of right female breast: Secondary | ICD-10-CM

## 2020-06-08 DIAGNOSIS — Z923 Personal history of irradiation: Secondary | ICD-10-CM | POA: Diagnosis not present

## 2020-06-08 DIAGNOSIS — Z08 Encounter for follow-up examination after completed treatment for malignant neoplasm: Secondary | ICD-10-CM | POA: Diagnosis not present

## 2020-06-08 DIAGNOSIS — Z79811 Long term (current) use of aromatase inhibitors: Secondary | ICD-10-CM | POA: Insufficient documentation

## 2020-06-08 DIAGNOSIS — Z170 Estrogen receptor positive status [ER+]: Secondary | ICD-10-CM | POA: Insufficient documentation

## 2020-06-08 NOTE — Telephone Encounter (Signed)
Can you alert patient please as will need to see her in office so we can write  a detailed note on this then with her current settings and needs, then we can work on prescription.  Thank you.

## 2020-06-08 NOTE — Telephone Encounter (Signed)
Per Diamantina Monks at [facility], patient will need an new prescription, detailed office note and demographics completed and we can then fax it over to [facility] at [phone]. Was informed [facility] no longer provide Cpap Machine and supplies for patients.

## 2020-06-08 NOTE — Progress Notes (Signed)
Radiation Oncology Follow up Note  Name: Melissa Merritt   Date:   06/08/2020 MRN:  [id number] DOB: [birth year]    This 70 y.o. female presents to the clinic today for 62-month follow-up status post whole breast radiation to her right breast for stage I ER/PR positive invasive mammary carcinoma.  REFERRING PROVIDER: Venita Lick, NP  HPI: Patient is a 70 year old female now at 6 months having completed whole breast radiation to her right breast for stage I ER/PR positive invasive mammary carcinoma.  Seen today in routine follow-up she is doing well.  She specifically denies breast tenderness cough or bone pain.  She has not yet had a mammogram..  She is currently on letrozole tolerating it well without side effect.  COMPLICATIONS OF TREATMENT: none  FOLLOW UP COMPLIANCE: keeps appointments   PHYSICAL EXAM:  BP (!) 148/90 (BP Location: Left Arm, Patient Position: Sitting)    Pulse 80    Temp (!) 97.5 F (36.4 C) (Tympanic)    Resp 18    Wt 224 lb 9.6 oz (101.9 kg)    BMI 35.18 kg/m  Lungs are clear to A&P cardiac examination essentially unremarkable with regular rate and rhythm. No dominant mass or nodularity is noted in either breast in 2 positions examined. Incision is well-healed. No axillary or supraclavicular adenopathy is appreciated. Cosmetic result is excellent.  Well-developed well-nourished patient in NAD. HEENT reveals PERLA, EOMI, discs not visualized.  Oral cavity is clear. No oral mucosal lesions are identified. Neck is clear without evidence of cervical or supraclavicular adenopathy. Lungs are clear to A&P. Cardiac examination is essentially unremarkable with regular rate and rhythm without murmur rub or thrill. Abdomen is benign with no organomegaly or masses noted. Motor sensory and DTR levels are equal and symmetric in the upper and lower extremities. Cranial nerves II through XII are grossly intact. Proprioception is intact. No peripheral adenopathy or edema is identified. No  motor or sensory levels are noted. Crude visual fields are within normal range.  RADIOLOGY RESULTS: No current films for review  PLAN: Present time patient is doing well with no evidence of disease 6 months out.  Of asked to see her at another 6 months and then will start once year follow-up visits.  She is already scheduled for mammograms in April.  Patient knows to call with any concerns.  She continues letrozole without side effect.  I would like to take this opportunity to thank you for allowing me to participate in the care of your patient.Noreene Filbert, MD

## 2020-06-08 NOTE — Progress Notes (Signed)
Survivorship Care Plan visit completed.  Treatment summary reviewed and given to patient.  ASCO answers booklet reviewed and given to patient.  CARE program and Cancer Transitions discussed with patient along with other resources cancer center offers to patients and caregivers.  Patient verbalized understanding.    Patient in agreement for APP to have a Virtual visit to introduce them to the Survivorship Clinic.  Encouraged patient to call for any questions or concerns. 

## 2020-06-09 NOTE — Telephone Encounter (Signed)
Called pt scheduled for 3/10

## 2020-06-13 ENCOUNTER — Other Ambulatory Visit: Payer: Self-pay | Admitting: *Deleted

## 2020-06-13 DIAGNOSIS — C50411 Malignant neoplasm of upper-outer quadrant of right female breast: Secondary | ICD-10-CM

## 2020-06-13 DIAGNOSIS — Z170 Estrogen receptor positive status [ER+]: Secondary | ICD-10-CM

## 2020-06-13 MED ORDER — LETROZOLE 2.5 MG PO TABS: 2.5000 mg | ORAL_TABLET | Freq: Every day | ORAL | 1 refills | Status: DC

## 2020-06-14 ENCOUNTER — Inpatient Hospital Stay: Payer: Medicare Other | Attending: Nurse Practitioner | Admitting: Nurse Practitioner

## 2020-06-16 ENCOUNTER — Ambulatory Visit (INDEPENDENT_AMBULATORY_CARE_PROVIDER_SITE_OTHER): Payer: Medicare Other | Admitting: Nurse Practitioner

## 2020-06-16 ENCOUNTER — Other Ambulatory Visit: Payer: Self-pay

## 2020-06-16 ENCOUNTER — Encounter: Payer: Self-pay | Admitting: Nurse Practitioner

## 2020-06-16 ENCOUNTER — Inpatient Hospital Stay (HOSPITAL_BASED_OUTPATIENT_CLINIC_OR_DEPARTMENT_OTHER): Payer: Medicare Other | Admitting: Nurse Practitioner

## 2020-06-16 DIAGNOSIS — Z853 Personal history of malignant neoplasm of breast: Secondary | ICD-10-CM | POA: Diagnosis not present

## 2020-06-16 DIAGNOSIS — G4733 Obstructive sleep apnea (adult) (pediatric): Secondary | ICD-10-CM

## 2020-06-16 NOTE — Progress Notes (Signed)
BP 136/74   Pulse 77   Temp 97.8 F (36.6 C) (Oral)   Wt 224 lb 12.8 oz (102 kg)   SpO2 97%   BMI 35.21 kg/m    Subjective:    Patient ID: Melissa Merritt, female    DOB: 1950/06/07, 70 y.o.   MRN: [id number]  HPI: Melissa Merritt is a 70 y.o. female  Chief Complaint  Patient presents with  . Discuss Cpap     Patient states she did a sleep study about a year ago and they do not have a prescription for a cpap machine and patient is here today to discuss    SLEEP APNEA Had sleep study on 06/09/2019 with [facility], orders under media and needs CPAP ordered.  On review of orders she needs CPAP with pressure setting of 9 cm H2O with a F30 mask -- auto titrate. Sleep apnea status: uncontrolled Duration: chronic Satisfied with current treatment?:  yes CPAP use:  will order Last sleep study: 06/09/2019 Treatments attempted: none Wakes feeling refreshed:  yes Daytime hypersomnolence:  yes Fatigue:  yes Insomnia:  yes Good sleep hygiene:  yes Difficulty falling asleep:  yes Difficulty staying asleep:  yes Snoring bothers bed partner:  yes Observed apnea by bed partner: yes Obesity:  yes Hypertension: yes  Pulmonary hypertension:  no Coronary artery disease:  no  Relevant past medical, surgical, family and social history reviewed and updated as indicated. Interim medical history since our last visit reviewed. Allergies and medications reviewed and updated.  Review of Systems  Constitutional: Negative for activity change, appetite change, diaphoresis, fatigue and fever.  Respiratory: Negative for cough, chest tightness and shortness of breath.   Cardiovascular: Negative for chest pain, palpitations and leg swelling.  Gastrointestinal: Negative.   Endocrine: Negative.   Psychiatric/Behavioral: Negative.     Per HPI unless specifically indicated above     Objective:    BP 136/74   Pulse 77   Temp 97.8 F (36.6 C) (Oral)   Wt 224 lb 12.8 oz (102 kg)   SpO2 97%   BMI  35.21 kg/m   Wt Readings from Last 3 Encounters:  06/16/20 224 lb 12.8 oz (102 kg)  06/08/20 224 lb 9.6 oz (101.9 kg)  05/30/20 224 lb (101.6 kg)    Physical Exam Vitals and nursing note reviewed.  Constitutional:      General: She is awake. She is not in acute distress.    Appearance: She is well-developed. She is obese. She is not ill-appearing.  HENT:     Head: Normocephalic.     Right Ear: Hearing normal.     Left Ear: Hearing normal.  Eyes:     General: Lids are normal.        Right eye: No discharge.        Left eye: No discharge.     Conjunctiva/sclera: Conjunctivae normal.     Pupils: Pupils are equal, round, and reactive to light.  Neck:     Thyroid: No thyromegaly.     Vascular: No carotid bruit.  Cardiovascular:     Rate and Rhythm: Normal rate and regular rhythm.     Heart sounds: Normal heart sounds. No murmur heard. No gallop.   Pulmonary:     Effort: Pulmonary effort is normal. No accessory muscle usage or respiratory distress.     Breath sounds: Normal breath sounds.  Abdominal:     General: Bowel sounds are normal.     Palpations: Abdomen is  soft.  Musculoskeletal:     Cervical back: Normal range of motion and neck supple.     Right lower leg: No edema.     Left lower leg: No edema.  Skin:    General: Skin is warm and dry.  Neurological:     Mental Status: She is alert and oriented to person, place, and time.  Psychiatric:        Attention and Perception: Attention normal.        Mood and Affect: Mood normal.        Speech: Speech normal.        Behavior: Behavior normal. Behavior is cooperative.     Results for orders placed or performed in visit on 05/25/20  HgB A1c  Result Value Ref Range   Hgb A1c MFr Bld 5.6 4.8 - 5.6 %   Est. average glucose Bld gHb Est-mCnc 114 mg/dL  Lipid Panel w/o Chol/HDL Ratio  Result Value Ref Range   Cholesterol, Total 227 (H) 100 - 199 mg/dL   Triglycerides 124 0 - 149 mg/dL   HDL 55 >39 mg/dL   VLDL  Cholesterol Cal 22 5 - 40 mg/dL   LDL Chol Calc (NIH) 150 (H) 0 - 99 mg/dL      Assessment & Plan:   Problem List Items Addressed This Visit      Respiratory   Obstructive apnea    Ongoing with sleep study 06/09/2019 -- has not received CPAP machine, will work on ordering with following: CPAP with pressure setting of 9 cm H2O with a F30 mask -- auto titrate.  Educated her on this and to return in August as scheduled.          Follow up plan: Return for as scheduled.

## 2020-06-16 NOTE — Assessment & Plan Note (Signed)
Ongoing with sleep study 06/09/2019 -- has not received CPAP machine, will work on ordering with following: CPAP with pressure setting of 9 cm H2O with a F30 mask -- auto titrate.  Educated her on this and to return in August as scheduled.

## 2020-06-16 NOTE — Progress Notes (Signed)
Appointment moved to Thursday. No charge.

## 2020-06-16 NOTE — Progress Notes (Addendum)
Survivorship Clinic Consult Note [facility]  Telephone:([phone] [phone] Virtual Visit Progress Note  I connected with Melissa Merritt on 06/16/20 at 10:30 AM EST by video enabled telemedicine visit and verified that I am speaking with the correct person using two identifiers.   I discussed the limitations, risks, security and privacy concerns of performing an evaluation and management service by telemedicine and the availability of in-person appointments. I also discussed with the patient that there may be a patient responsible charge related to this service. The patient expressed understanding and agreed to proceed.   Other persons participating in the visit and their role in the encounter: none  Patient's location: home Provider's location: clinic  CLINIC: Survivorship  REASON FOR VISIT: Long-term survivorship surveillance visit for history of breast cancer  BRIEF ONCOLOGIC HISTORY:  Oncology History Overview Note  Bilateral diagnostic mammogram on 07/08/2019 revealed persistent architectural distortion involving the upper outer quadrant of the right breast without sonographic correlate.  There was no pathologic right axillary lymphadenopathy.  No mammographic or sonographic evidence of malignancy in the left breast.  CA27.29 was 19.0 (07/24/19) at diagnosis.   Patient underwent stereotactic biopsy on 07/17/2019.  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  Intermediate nuclear grade with central necrosis.  Consistent with stage Ia right breast cancer.  She underwent partial mastectomy and right axillary sentinel lymph node biopsy on 08/13/2019.  Pathology revealed grade 1 invasive mammary carcinoma, no special type, and intermediate grade DCIS.  There is atypical lobular hyperplasia with ductal involvement.  Additional posterior margin revealed no evidence of residual carcinoma or DCIS.  Closest margin was > 10 mm and 2 mm  for DCIS.  1 lymph node was negative for malignancy.  Tumor was ER/PR positive and HER-2/neu negative.  Pathologic stage was pT1a pN0 (sn).   She received 5040 cGy to her right breast and 1400 cGy boost to her scar from 09/28/2019-11/17/2019.  She began tamoxifen on 11/27/2019.  Tamoxifen discontinued in December 2021 due to intolerable side effects.  She was switched to Femara, started 04/12/20.   Baseline bone density scan in April 2021 consistent with osteopenia with T score of -1.5. Plan for prolia.    Malignant neoplasm of right breast in female, estrogen receptor positive ([place])  07/24/2019 Initial Diagnosis   Malignant neoplasm of right breast in female, estrogen receptor positive ([place])     INTERVAL HISTORY: Patient presents to the survivorship clinic today for initial meeting to review her survivorship care plan detailing her treatment course for breast cancer, as well as monitoring long-term side effects of that treatment, education regarding health maintenance, screening, and overall wellness and health promotion. She continues letrozole. Previously on tamoxifen but d/c due to side effects. Having leg swelling bilaterally since starting letrozole. Overall, she reports feeling well since completing treatment.  She offers no other specific complaints today.  REVIEW OF SYSTEMS:  Review of Systems  Constitutional: Negative for chills, fever, malaise/fatigue and weight loss.  HENT: Negative for hearing loss, nosebleeds, sore throat and tinnitus.   Eyes: Negative for blurred vision and double vision.  Respiratory: Negative for cough, hemoptysis, shortness of breath and wheezing.   Cardiovascular: Negative for chest pain, palpitations and leg swelling.  Gastrointestinal: Negative for abdominal pain, blood in stool, constipation, diarrhea, melena, nausea and vomiting.  Genitourinary: Negative for dysuria and urgency.  Musculoskeletal: Negative for back pain, falls, joint pain and myalgias.  Skin:  Negative for itching and rash.  Neurological: Negative for dizziness, tingling, sensory change, loss of consciousness, weakness and headaches.  Endo/Heme/Allergies: Negative for environmental allergies. Does not bruise/bleed easily.  Psychiatric/Behavioral: Negative for depression. The patient is not nervous/anxious and does not have insomnia.   Breast: No new lumps, bumps, skin changes, discomfort, or discharge. No nipple changes.     PAST MEDICAL/SURGICAL HISTORY:  Past Medical History:  Diagnosis Date  . Benign tumor of lung   . Cancer [facility])    right breast  . CHF (congestive heart failure) ([place])   . Dyspnea   . GERD (gastroesophageal reflux disease)   . Hyperlipidemia   . Hypertension   . Insomnia   . Osteopenia   . Plantar fasciitis   . PONV (postoperative nausea and vomiting)    vomiting 2 days after lung surgery  . S/P pneumonectomy    right lower lobe  . Urinary incontinence    Past Surgical History:  Procedure Laterality Date  . BREAST BIOPSY Right 07/17/2019   Affirm bx-"Ribbon" clip-path pending  . bronchoscopy    . CATARACT EXTRACTION, BILATERAL     Left- 11/21/15, right- 12/23/15  . CHOLECYSTECTOMY    . CYSTECTOMY  11/03/09   cheek  . dilation and curretage    . EYE SURGERY Bilateral    cataract  . FOOT SURGERY Bilateral 2010  . LOBECTOMY    . LUNG SURGERY Right   . PART MASTECTOMY,RADIO FREQUENCY LOCALIZER,AXILLARY SENTINEL NODE BIOPSY Right 08/13/2019   Procedure: PART MASTECTOMY,RADIO FREQUENCY LOCALIZER,AXILLARY SENTINEL NODE BIOPSY;  Surgeon: Benjamine Sprague, DO;  Location: [facility] ORS;  Service: General;  Laterality: Right;  . UPPER GI ENDOSCOPY  2005    SOCIAL HISTORY:  Social History   Socioeconomic History  . Marital status: Married    Spouse name: Not on file  . Number of children: Not on file  . Years of education: Not on file  . Highest education level: Some college, no degree  Occupational History  . Occupation: retired   Tobacco Use  .  Smoking status: Former Smoker    Quit date: 07/09/1979    Years since quitting: 40.9  . Smokeless tobacco: Never Used  Vaping Use  . Vaping Use: Never used  Substance and Sexual Activity  . Alcohol use: No  . Drug use: No  . Sexual activity: Yes  Other Topics Concern  . Not on file  Social History Narrative  . Not on file   Social Determinants of Health   Financial Resource Strain: Low Risk   . Difficulty of Paying Living Expenses: Not hard at all  Food Insecurity: No Food Insecurity  . Worried About Charity fundraiser in the Last Year: Never true  . Ran Out of Food in the Last Year: Never true  Transportation Needs: No Transportation Needs  . Lack of Transportation (Medical): No  . Lack of Transportation (Non-Medical): No  Physical Activity: Inactive  . Days of Exercise per Week: 0 days  . Minutes of Exercise per Session: 0 min  Stress: No Stress Concern Present  . Feeling of Stress : Not at all  Social Connections: Not on file  Intimate Partner Violence: Not on file   Immunization History  Administered Date(s) Administered  . Fluad Quad(high Dose 65+) 02/24/2019  . Influenza, High Dose Seasonal PF 12/27/2015, 01/15/2018  . Influenza,inj,Quad PF,6+ Mos 01/21/2015  . Influenza-Unspecified 02/08/2017  . Moderna Sars-Covid-2 Vaccination 06/10/2019, 07/07/2019, 04/28/2020  . Pneumococcal Conjugate-13 05/19/2014  . Pneumococcal Polysaccharide-23 01/14/2008, 05/23/2015  . Td 09/11/2005  .  Tdap 12/27/2015  . Zoster 09/04/2011     ALLERGIES:  Allergies  Allergen Reactions  . Contrast Media [Iodinated Diagnostic Agents] Swelling    Eye swelling shut  . Other Swelling    Eye swelling shut IV contrast media   . Ace Inhibitors Other (See Comments)    "Makes me feel strange."  . Hctz [Hydrochlorothiazide] Other (See Comments)    arms ache  . Keflex [Cephalexin] Other (See Comments)    thrush    CURRENT MEDICATIONS:  Outpatient Encounter Medications as of 06/16/2020   Medication Sig  . amLODipine (NORVASC) 5 MG tablet Take 1 tablet (5 mg total) by mouth daily.  Marland Kitchen aspirin 81 MG tablet Take 81 mg by mouth daily.  . Biotin 5000 MCG TABS Take 5,000 mcg by mouth 2 (two) times daily.  . Calcium Carb-Cholecalciferol (CALCIUM-VITAMIN D) 600-400 MG-UNIT TABS Take 1 tablet by mouth 2 (two) times daily.  . cholecalciferol (VITAMIN D3) 25 MCG (1000 UNIT) tablet Take 1,000 Units by mouth in the morning and at bedtime.  . Cyanocobalamin (B-12) 2500 MCG TABS Take 2,500 mcg by mouth daily.  . ferrous sulfate 220 (44 Fe) MG/5ML solution Take 220 mg by mouth daily.  . folic acid (FOLVITE) 149 MCG tablet Take 400 mcg by mouth daily.  Marland Kitchen ibuprofen (ADVIL) 200 MG tablet Take 400 mg by mouth every 6 (six) hours as needed for moderate pain.   Marland Kitchen letrozole (FEMARA) 2.5 MG tablet Take 1 tablet (2.5 mg total) by mouth daily.  . metoprolol succinate (TOPROL-XL) 25 MG 24 hr tablet Take 1 tablet (25 mg total) by mouth daily.  . pantoprazole (PROTONIX) 40 MG tablet Take 1 tablet (40 mg total) by mouth 2 (two) times daily.  . Red Yeast Rice Extract (RED YEAST RICE PO) Take 1 tablet by mouth in the morning and at bedtime.  . St Johns Wort 300 MG CAPS Take 300 mg by mouth 2 (two) times daily.  . valsartan (DIOVAN) 160 MG tablet Take 1 tablet (160 mg total) by mouth daily.   No facility-administered encounter medications on file as of 06/16/2020.    ONCOLOGIC FAMILY HISTORY:  Family History  Problem Relation Age of Onset  . COPD Mother   . Hypertension Mother   . Emphysema Mother   . Osteoporosis Mother   . Heart disease Father   . Hyperlipidemia Sister   . Hyperlipidemia Brother   . Heart disease Maternal Grandmother   . Heart disease Maternal Grandfather   . Heart disease Paternal Grandmother   . Stroke Paternal Grandmother   . Heart disease Paternal Grandfather   . Stroke Paternal Grandfather   . Cancer Maternal Aunt        breast and lung  . Osteoporosis Maternal Aunt   .  Aortic aneurysm Maternal Aunt   . Breast cancer Maternal Aunt 76    GENETIC COUNSELING/TESTING: Not indicated  PHYSICAL EXAMINATION:  Exam limited due to telemedicine  General: Well-nourished, well-appearing female in no acute distress.  Neuro: No focal deficits. Psych: very pleasant, normal behavior and mood  LABORATORY DATA:  No results found for: LABCA2  IMAGING STUDIES:  No imaging on site   ASSESSMENT & PLAN:  Melissa Merritt is a pleasant 69 y.o. female with history of Stage 1A right breast cancer, diagnosed 07/17/2019, treated with surgery and adjuvant radiation, currently on aromatase inhibitor.  She presents to the survivorship clinic for initial meeting and routine follow-up since completing treatment we reviewed her survivorship care plan and  addressed acute survivorship concerns since completing treatment.  1. History of stage IA breast cancer: Melissa Merritt is has recovered well from definitive treatment for breast cancer.  Today, she received a copy of her comprehensive survivorship care plan which was reviewed with her in detail by the nurse navigator. The SCP details her cancer diagnosis, treatment course, potential late/long-term effects of treatments appropriate follow-up care with recommendations for future, and patient education resources.  A copy of this summary, along with a letter will be sent to the patient's primary care provider via mail/backslash in basket after today's visit. We encouraged her to continue to follow-up with her health care team to continue to monitor help and manage any effects of treatment.  We discussed that there is a chance for the cancer can come back.  The vast majority of recurrences will be detected in the 2-3 years after treatment though late recurrences can occur.  The recommended surveillance for follow-up after diagnosis was reviewed in detail but depending on risk, we generally recommend follow up with breast exam every 3-6 months for the first  2 years then every 6-12 months for 3-5 years then annually thereafter.  We discussed that the goal of surveillance after treatment is to detect disease if it returns as early as possible.  The most useful tools for detecting recurrence include a thorough evaluation of symptoms and a physical exam which includes a complete breast exam and yearly mammograms or other imaging studies. Additional imaging may be considered based on symptoms or examination findings which is why is it is important to report concerns to her health care team.  Blood work may be performed at these visits for if there are symptoms or exam findings concerning for recurrence.  Patient was advised that if she feels that something is not right she should schedule an appointment to be evaluated.  Concerning symptoms discussed in detail.   Melissa Merritt will return to the survivorship clinic as needed and continue scheduled followups with her healthcare team as outlined in her SCP.   2. Survivorship Care Survey: Survivorship Care Survey completed post-treatment as recommended by the NCCN Survivorship Guidelines. Patient's answers were reviewed. Based on these, as well as the type of cancer and treatment she underwent, the following Survivorship Topics were discussed:   - Anxiety, Depression, and Distress- denies - Cognitive Function- denies - Fatigue- denies - Lymphedema- denies - Hormone-Related Symptoms- denies - Pain- denies - Sexual Function-  Not currently sexually active - Sleep Disorder- yes; not new since treatment. Managed by PCP. Awaiting CPAP  3. Tobacco Avoidance: I commended Melissa Merritt's continued efforts to remain tobacco-free.  We discussed that one of the most important risk reduction strategies in preventing cancer recurrence in all patients is abstaining from tobacco products.   4. Cancer screening/Health & Wellness Promotion:  Due to Melissa Merritt's history and her age, she should receive screening for skin cancers, colon  cancer, and gynecologic cancers. The information and recommendations are listed on the patient's comprehensive care plan/treatment summary and were reviewed in detail with the patient.  I encouraged her to speak with her PCP about arranging these and performing annual wellness exams, as appropriate.    Colorectal cancer-for the average risk patient, screening for colon cancer is recommended beginning at age 30.  Various screening strategies exist including colonoscopy, sigmoidoscopy, stool testing for blood, etc.  You can discuss these with your primary care provider to determine which option is best for you.  5. Osteoporosis-bone mineral density  testing is typically recommended after 69 or younger women with medical conditions or use of medications associated with low bone mass or bone loss.  Additional supplementation of calcium and/or vitamin D may be recommended.  Left food sources of calcium are ideal, a supplement can be taken to total 1200 mg/day and should be taken with food to enhance absorption.  The recommended dose of vitamin D is 9377888701 IU daily.  6. Physical activity/Healthy eating: Getting adequate physical activity and maintaining a healthy diet as a cancer survivor is important for overall wellness and reduces the risk of cancer recurrence.   We reviewed the "Nutrition Rainbow" handout, as well as the handout "Take Control of Your Health and Reduce Your Cancer Risk" from the [facility].  She was also encouraged to engage in moderate to vigorous exercise for 30 minutes per day most days of the week. We  discussed the CARE program which is offered 2 days a week at [facility] without cost to cancer survivors. Referral sent today.  We also discussed the importance and health benefits of maintaining a healthy weight and eating a balanced diet. Melissa Merritt was encouraged to consume 5-7 servings of fruits and vegetables per day. We discussed that a healthy BMI is 18.5-24.9 and that  maintaining a health weight reduces risk of cancer recurrences.   7. Support services/Counseling: Melissa Merritt was seen today in in effort to address both the physical and social concerns of our cancer survivors at [facility] at [facility]. It is not uncommon for this period of the patient's cancer care trajectory to be one of many emotions and stressors.  I provided support today through active listening, validation of concerns, and expressive supportive counseling.  Melissa Merritt was encouraged to take advantage of our support services programs and support groups to better cope in her new life as a cancer survivor after completing anti-cancer treatment. We also discussed Counseling Services available to Cancer Survivors with [facility]. She declines a referral at this time but one can be considered at any time.   Dispo:  - Return to [facility] for follow-up with Medical Oncology, Dr. Mike Gip on 08/08/20 - Return to [facility] for follow-up with Radiation Oncology, Dr. Baruch Gouty, on 12/14/20 - Return to survivorship clinic as needed; no additional follow-up needed at this time.  - Consider transitioning the patient to long-term survivorship, when clinically appropriate.   I discussed the assessment and treatment plan with the patient. The patient was provided an opportunity to ask questions and all were answered. The patient agreed with the plan and demonstrated an understanding of the instructions.   The patient was advised to call back or seek an in-person evaluation if the symptoms worsen or if the condition fails to improve as anticipated.   I spent 25 minutes face-to-face video visit time dedicated to the care of this patient on the date of this encounter to include pre-visit review of medical oncology notes, rad onc notes, bone density scan, face-to-face time with the patient, and post visit ordering of testing/documentation.   [name], DNP, AGNP-C [facility]  Note: PRIMARY CARE PROVIDER Venita Lick, Altha [phone]

## 2020-06-16 NOTE — Patient Instructions (Signed)

## 2020-06-21 ENCOUNTER — Telehealth: Payer: Self-pay

## 2020-06-21 DIAGNOSIS — J9809 Other diseases of bronchus, not elsewhere classified: Secondary | ICD-10-CM | POA: Diagnosis not present

## 2020-06-21 NOTE — Telephone Encounter (Signed)
Opened in error

## 2020-06-28 ENCOUNTER — Other Ambulatory Visit: Payer: Self-pay

## 2020-06-28 ENCOUNTER — Encounter: Payer: Medicare Other | Attending: Nurse Practitioner

## 2020-06-28 DIAGNOSIS — Z853 Personal history of malignant neoplasm of breast: Secondary | ICD-10-CM

## 2020-06-28 NOTE — Progress Notes (Signed)
CARE Daily Session Note  Patient Details  Name: Melissa Merritt MRN: [id number] Date of Birth: Jul 15, 1950 Referring Provider:   April Manson Cancer Associated Rehabilitation & Exercise from 06/28/2020 in [facility] Cardiac and Pulmonary Rehab  Referring Provider [name], NP      Encounter Date: 06/28/2020  Check In:  Session Check In - 06/28/20 1436      Check-In   Supervising physician immediately available to respond to emergencies See telemetry face sheet for immediately available ER MD    Location [facility]-Cardiac & Pulmonary Rehab    Staff Present Birdie Sons, MPA, Nino Glow, MS Exercise Physiologist;Jessica Luan Pulling, MA, RCEP, CCRP, CCET    Virtual Visit No    Medication changes reported     Yes    Comments Added in [place] inhaler once daily    Fall or balance concerns reported    No    Warm-up and Cool-down Not performed (comment)   6MWT and Gym Orientation   Resistance Training Performed No   6MWT   VAD Patient? No    PAD/SET Patient? No      Pain Assessment   Currently in Pain? No/denies            6 Minute Walk    Row Name 06/28/20 1438         6 Minute Walk   Phase Initial     Distance 1077 feet     Walk Time 6 minutes     # of Rest Breaks 0     MPH 2.03     METS 2.36     RPE 9     Perceived Dyspnea  2     VO2 Peak 8.26     Symptoms No     Resting HR 85 bpm     Resting BP 140/78     Resting Oxygen Saturation  95 %     Exercise Oxygen Saturation  during 6 min walk 84 %     Max Ex. HR 107 bpm     Max Ex. BP 162/80     2 Minute Post BP 136/78           Advised patient to monitor O2 sats at home as her oxygen dropped to 84% on her 6MWT. Patient has a pulse ox at home- PLB technique was enforced. Patient understood.   Social History   Tobacco Use  Smoking Status Former Smoker  . Quit date: 07/09/1979  . Years since quitting: 41.0  Smokeless Tobacco Never Used    Goals Met:  Using PLB without cueing & demonstrates good  technique Exercise tolerated well Personal goals reviewed No report of cardiac concerns or symptoms  Goals Unmet:  Not Applicable  Comments: First full day of orientation.  All starting workloads were established based on the results of the 6 minute walk test done at initial orientation visit.  The plan for exercise progression was also introduced and progression will be customized based on patient's performance and goals.   Dr. Emily Filbert is Medical Director for [place] and [facility].

## 2020-06-30 ENCOUNTER — Other Ambulatory Visit: Payer: Self-pay

## 2020-06-30 DIAGNOSIS — Z853 Personal history of malignant neoplasm of breast: Secondary | ICD-10-CM

## 2020-06-30 NOTE — Progress Notes (Signed)
Daily Session Note  Patient Details  Name: Melissa Merritt MRN: [id number] Date of Birth: [birth year] Referring Provider:   April Manson Cancer Associated Rehabilitation & Exercise from 06/28/2020 in [facility] Cardiac and Pulmonary Rehab  Referring Provider [name], NP      Encounter Date: 06/30/2020  Check In:  Session Check In - 06/30/20 1242      Check-In   Supervising physician immediately available to respond to emergencies See telemetry face sheet for immediately available ER MD    Location [facility]-Cardiac & Pulmonary Rehab    Staff Present Birdie Sons, MPA, Elveria Rising, BA, ACSM CEP, Exercise Physiologist;Kara Eliezer Bottom, MS Exercise Physiologist    Virtual Visit No    Medication changes reported     No    Fall or balance concerns reported    No    Warm-up and Cool-down Performed on first and last piece of equipment    Resistance Training Performed Yes    VAD Patient? No    PAD/SET Patient? No      Pain Assessment   Currently in Pain? No/denies              Social History   Tobacco Use  Smoking Status Former Smoker  . Quit date: 07/09/1979  . Years since quitting: 41.0  Smokeless Tobacco Never Used    Goals Met:  Yoga completed Independence with exercise equipment Exercise tolerated well No report of cardiac concerns or symptoms Strength training completed today  Goals Unmet:  Not Applicable  Comments: Pt able to follow exercise prescription today without complaint.  Will continue to monitor for progression.    Dr. Emily Filbert is Medical Director for [place] and [facility].

## 2020-07-08 ENCOUNTER — Ambulatory Visit
Admission: RE | Admit: 2020-07-08 | Discharge: 2020-07-08 | Disposition: A | Discharge status: Home / Self Care | Payer: Medicare Other | Source: Ambulatory Visit | Attending: Hematology and Oncology | Admitting: Hematology and Oncology

## 2020-07-08 ENCOUNTER — Other Ambulatory Visit: Payer: Self-pay | Admitting: Hematology and Oncology

## 2020-07-08 ENCOUNTER — Other Ambulatory Visit: Payer: Self-pay

## 2020-07-08 DIAGNOSIS — Z170 Estrogen receptor positive status [ER+]: Secondary | ICD-10-CM

## 2020-07-08 DIAGNOSIS — C50411 Malignant neoplasm of upper-outer quadrant of right female breast: Secondary | ICD-10-CM

## 2020-07-08 DIAGNOSIS — N63 Unspecified lump in unspecified breast: Secondary | ICD-10-CM | POA: Insufficient documentation

## 2020-07-08 DIAGNOSIS — Z853 Personal history of malignant neoplasm of breast: Secondary | ICD-10-CM | POA: Diagnosis not present

## 2020-07-08 DIAGNOSIS — N6489 Other specified disorders of breast: Secondary | ICD-10-CM | POA: Diagnosis not present

## 2020-07-08 DIAGNOSIS — R922 Inconclusive mammogram: Secondary | ICD-10-CM | POA: Diagnosis not present

## 2020-07-08 HISTORY — DX: Personal history of irradiation: Z92.3

## 2020-07-08 HISTORY — DX: Malignant neoplasm of unspecified site of unspecified female breast: C50.919

## 2020-07-14 ENCOUNTER — Other Ambulatory Visit: Payer: Self-pay

## 2020-07-14 ENCOUNTER — Encounter: Payer: Medicare Other | Attending: Nurse Practitioner | Admitting: *Deleted

## 2020-07-14 DIAGNOSIS — Z853 Personal history of malignant neoplasm of breast: Secondary | ICD-10-CM | POA: Insufficient documentation

## 2020-07-14 NOTE — Progress Notes (Signed)
Daily Session Note  Patient Details  Name: Melissa Merritt MRN: [id number] Date of Birth: Nov 12, 1950 Referring Provider:   April Manson Cancer Associated Rehabilitation & Exercise from 06/28/2020 in [facility] Cardiac and Pulmonary Rehab  Referring Provider [name], NP      Encounter Date: 07/14/2020  Check In:  Session Check In - 07/14/20 1229      Check-In   Supervising physician immediately available to respond to emergencies See telemetry face sheet for immediately available ER MD    Location [facility]-Cardiac & Pulmonary Rehab    Staff Present Alberteen Sam, MA, RCEP, CCRP, Marylynn Pearson, MS Exercise Physiologist    Virtual Visit No    Medication changes reported     No    Fall or balance concerns reported    No    Tobacco Cessation No Change    Warm-up and Cool-down Performed on first and last piece of equipment    Resistance Training Performed Yes    VAD Patient? No    PAD/SET Patient? No      Pain Assessment   Currently in Pain? No/denies              Social History   Tobacco Use  Smoking Status Former Smoker  . Quit date: 07/09/1979  . Years since quitting: 41.0  Smokeless Tobacco Never Used    Goals Met:  Proper associated with RPD/PD & O2 Sat Independence with exercise equipment Exercise tolerated well No report of cardiac concerns or symptoms Strength training completed today  Goals Unmet:  Not Applicable  Comments: Pt able to follow exercise prescription today without complaint.  Will continue to monitor for progression.    Dr. Emily Filbert is Medical Director for [place] and [facility].

## 2020-07-18 DIAGNOSIS — N6323 Unspecified lump in the left breast, lower outer quadrant: Secondary | ICD-10-CM | POA: Diagnosis not present

## 2020-07-18 DIAGNOSIS — Z853 Personal history of malignant neoplasm of breast: Secondary | ICD-10-CM | POA: Diagnosis not present

## 2020-07-19 ENCOUNTER — Other Ambulatory Visit: Payer: Self-pay

## 2020-07-19 DIAGNOSIS — Z853 Personal history of malignant neoplasm of breast: Secondary | ICD-10-CM

## 2020-07-19 NOTE — Progress Notes (Signed)
Daily Session Note  Patient Details  Name: Melissa Merritt MRN: [id number] Date of Birth: 06/28/1950 Referring Provider:   April Manson Cancer Associated Rehabilitation & Exercise from 06/28/2020 in [facility] Cardiac and Pulmonary Rehab  Referring Provider [name], NP      Encounter Date: 07/19/2020  Check In:  Session Check In - 07/19/20 1246      Check-In   Supervising physician immediately available to respond to emergencies See telemetry face sheet for immediately available ER MD    Location [facility]-Cardiac & Pulmonary Rehab    Staff Present Birdie Sons, MPA, RN;Jessica Luan Pulling, MA, RCEP, CCRP, Marylynn Pearson, MS Exercise Physiologist    Virtual Visit No    Medication changes reported     No    Fall or balance concerns reported    No    Tobacco Cessation No Change    Warm-up and Cool-down Performed on first and last piece of equipment    Resistance Training Performed Yes    VAD Patient? No    PAD/SET Patient? No      Pain Assessment   Currently in Pain? No/denies              Social History   Tobacco Use  Smoking Status Former Smoker  . Quit date: 07/09/1979  . Years since quitting: 41.0  Smokeless Tobacco Never Used    Goals Met:  Independence with exercise equipment Exercise tolerated well No report of cardiac concerns or symptoms Strength training completed today  Goals Unmet:  Not Applicable  Comments: Pt able to follow exercise prescription today without complaint.  Will continue to monitor for progression.    Dr. Emily Filbert is Medical Director for [place] and [facility].

## 2020-07-21 ENCOUNTER — Other Ambulatory Visit: Payer: Self-pay

## 2020-07-21 DIAGNOSIS — Z853 Personal history of malignant neoplasm of breast: Secondary | ICD-10-CM

## 2020-07-21 NOTE — Progress Notes (Signed)
Daily Session Note  Patient Details  Name: Melissa Merritt MRN: [id number] Date of Birth: 01/30/1951 Referring Provider:   Flowsheet Row Cancer Associated Rehabilitation & Exercise from 06/28/2020 in [facility] Cardiac and Pulmonary Rehab  Referring Provider Lauren Allen, NP      Encounter Date: 07/21/2020  Check In:  Session Check In - 07/21/20 1214      Check-In   Supervising physician immediately available to respond to emergencies See telemetry face sheet for immediately available ER MD    Location [facility]-Cardiac & Pulmonary Rehab    Staff Present Kelly Bollinger, MPA, RN;Kara Langdon, MS Exercise Physiologist;Amanda Sommer, BA, ACSM CEP, Exercise Physiologist    Virtual Visit No    Medication changes reported     No    Fall or balance concerns reported    No    Tobacco Cessation No Change    Warm-up and Cool-down Performed on first and last piece of equipment    Resistance Training Performed Yes    VAD Patient? No    PAD/SET Patient? No      Pain Assessment   Currently in Pain? No/denies              Social History   Tobacco Use  Smoking Status Former Smoker  . Quit date: 07/09/1979  . Years since quitting: 41.0  Smokeless Tobacco Never Used    Goals Met:  Independence with exercise equipment Exercise tolerated well No report of cardiac concerns or symptoms Strength training completed today  Goals Unmet:  Not Applicable  Comments: Pt able to follow exercise prescription today without complaint.  Will continue to monitor for progression.  Discussed O2 levels, sats decrease during exercise sometimes. Needs to monitor at home with pulse ox, especially when walking. Maintain above 88% always.    Dr. Mark Miller is Medical Director for [facility] and [facility]. 

## 2020-07-26 ENCOUNTER — Other Ambulatory Visit: Payer: Self-pay

## 2020-07-26 DIAGNOSIS — Z853 Personal history of malignant neoplasm of breast: Secondary | ICD-10-CM

## 2020-07-26 NOTE — Progress Notes (Signed)
Daily Session Note  Patient Details  Name: Melissa Merritt MRN: [id number] Date of Birth: 06/04/1950 Referring Provider:   Flowsheet Row Cancer Associated Rehabilitation & Exercise from 06/28/2020 in [facility] Cardiac and Pulmonary Rehab  Referring Provider Lauren Allen, NP      Encounter Date: 07/26/2020  Check In:  Session Check In - 07/26/20 1400      Check-In   Supervising physician immediately available to respond to emergencies See telemetry face sheet for immediately available ER MD    Location [facility]-Cardiac & Pulmonary Rehab    Staff Present Kara Langdon, MS Exercise Physiologist;Melissa Caiola RDN, LDN    Virtual Visit No    Medication changes reported     No    Fall or balance concerns reported    No    Warm-up and Cool-down Performed on first and last piece of equipment    Resistance Training Performed Yes    VAD Patient? No    PAD/SET Patient? No              Social History   Tobacco Use  Smoking Status Former Smoker  . Quit date: 07/09/1979  . Years since quitting: 41.0  Smokeless Tobacco Never Used    Goals Met:  Independence with exercise equipment Exercise tolerated well No report of cardiac concerns or symptoms Strength training completed today  Goals Unmet:  Not Applicable  Comments: Pt able to follow exercise prescription today without complaint.  Will continue to monitor for progression.   Dr. Mark Miller is Medical Director for [facility] and [facility]. 

## 2020-07-28 ENCOUNTER — Other Ambulatory Visit: Payer: Self-pay

## 2020-07-28 DIAGNOSIS — Z853 Personal history of malignant neoplasm of breast: Secondary | ICD-10-CM

## 2020-07-28 NOTE — Progress Notes (Signed)
Daily Session Note  Patient Details  Name: Melissa Merritt MRN: [id number] Date of Birth: [birth year] Referring Provider:   April Manson Cancer Associated Rehabilitation & Exercise from 06/28/2020 in [facility] Cardiac and Pulmonary Rehab  Referring Provider [name], NP      Encounter Date: 07/28/2020  Check In:  Session Check In - 07/28/20 1239      Check-In   Supervising physician immediately available to respond to emergencies See telemetry face sheet for immediately available ER MD    Location [facility]-Cardiac & Pulmonary Rehab    Staff Present Birdie Sons, MPA, Elveria Rising, BA, ACSM CEP, Exercise Physiologist;Kara Eliezer Bottom, MS Exercise Physiologist    Virtual Visit No    Medication changes reported     No    Fall or balance concerns reported    No    Tobacco Cessation No Change    Warm-up and Cool-down Performed on first and last piece of equipment    Resistance Training Performed Yes    VAD Patient? No    PAD/SET Patient? No      Pain Assessment   Currently in Pain? No/denies              Social History   Tobacco Use  Smoking Status Former Smoker  . Quit date: 07/09/1979  . Years since quitting: 41.0  Smokeless Tobacco Never Used    Goals Met:  Independence with exercise equipment Exercise tolerated well No report of cardiac concerns or symptoms Strength training completed today  Goals Unmet:  Not Applicable  Comments: Pt able to follow exercise prescription today without complaint.  Will continue to monitor for progression.    Dr. Emily Filbert is Medical Director for [place] and [facility].

## 2020-08-02 ENCOUNTER — Other Ambulatory Visit: Payer: Self-pay

## 2020-08-02 DIAGNOSIS — Z853 Personal history of malignant neoplasm of breast: Secondary | ICD-10-CM

## 2020-08-02 NOTE — Progress Notes (Signed)
Daily Session Note  Patient Details  Name: Melissa Merritt MRN: [id number] Date of Birth: 12-03-1950 Referring Provider:   April Manson Cancer Associated Rehabilitation & Exercise from 06/28/2020 in [facility] Re[facility]  Referring Provider [name], NP      Encounter Date: 08/02/2020  Check In:  Session Check In - 08/02/20 1244      Check-In   Supervising physician immediately available to respond to emergencies See telemetry face sheet for immediately available ER MD    Location [facility] & Pul[facility] Re[facility]    Staff Present Birdie Sons, MPA, RN;Jessica Luan Pulling, MA, RCEP, CCRP, Marylynn Pearson, MS Exercise Physiologist    Virtual Visit No    Medication changes reported     No    Fall or balance concerns reported    No    Tobacco Cessation No Change    Warm-up and Cool-down Performed on first and last piece of equipment    Resistance Training Performed Yes    VAD Patient? No    PAD/SET Patient? No      Pain Assessment   Currently in Pain? No/denies              Social History   Tobacco Use  Smoking Status Former Smoker  . Quit date: 07/09/1979  . Years since quitting: 41.0  Smokeless Tobacco Never Used    Goals Met:  Independence with exercise equipment Exercise tolerated well No report of cardiac concerns or symptoms Strength training completed today  Goals Unmet:  Not Applicable  Comments: Pt able to follow exercise prescription today without complaint.  Will continue to monitor for progression.    Dr. Emily Filbert is Medical Director for [place] and [facility].

## 2020-08-03 ENCOUNTER — Other Ambulatory Visit: Payer: Self-pay

## 2020-08-03 DIAGNOSIS — Z170 Estrogen receptor positive status [ER+]: Secondary | ICD-10-CM

## 2020-08-03 DIAGNOSIS — E538 Deficiency of other specified B group vitamins: Secondary | ICD-10-CM

## 2020-08-03 DIAGNOSIS — D649 Anemia, unspecified: Secondary | ICD-10-CM

## 2020-08-03 DIAGNOSIS — Z853 Personal history of malignant neoplasm of breast: Secondary | ICD-10-CM

## 2020-08-04 ENCOUNTER — Other Ambulatory Visit: Payer: Self-pay

## 2020-08-04 DIAGNOSIS — Z853 Personal history of malignant neoplasm of breast: Secondary | ICD-10-CM

## 2020-08-04 NOTE — Progress Notes (Signed)
Daily Session Note  Patient Details  Name: Melissa Merritt MRN: [id number] Date of Birth: 08-26-1950 Referring Provider:   April Manson Cancer Associated Rehabilitation & Exercise from 06/28/2020 in [facility] Cardiac and Pulmonary Rehab  Referring Provider [name], NP      Encounter Date: 08/04/2020  Check In:  Session Check In - 08/04/20 [place]      Check-In   Supervising physician immediately available to respond to emergencies See telemetry face sheet for immediately available ER MD    Location [facility]-Cardiac & Pulmonary Rehab    Staff Present Nada Maclachlan, BA, ACSM CEP, Exercise Physiologist;Vrishank Moster Eliezer Bottom, MS Exercise Physiologist    Virtual Visit No    Medication changes reported     No    Warm-up and Cool-down Performed on first and last piece of equipment    Resistance Training Performed --   Yoga   VAD Patient? No              Social History   Tobacco Use  Smoking Status Former Smoker  . Quit date: 07/09/1979  . Years since quitting: 41.1  Smokeless Tobacco Never Used    Goals Met:  Exercise tolerated well Queuing for purse lip breathing No report of cardiac concerns or symptoms  Goals Unmet:  Not Applicable  Comments: Pt able to follow exercise prescription today without complaint.  Will continue to monitor for progression.  Yoga session completed today by Exercise Physiologist, Nada Maclachlan, CEP.    Dr. Emily Filbert is Medical Director for [place] and [facility].

## 2020-08-08 ENCOUNTER — Inpatient Hospital Stay (HOSPITAL_BASED_OUTPATIENT_CLINIC_OR_DEPARTMENT_OTHER): Payer: Medicare Other | Admitting: Oncology

## 2020-08-08 ENCOUNTER — Inpatient Hospital Stay: Payer: Medicare Other | Attending: Oncology

## 2020-08-08 ENCOUNTER — Other Ambulatory Visit: Payer: Self-pay

## 2020-08-08 ENCOUNTER — Encounter: Payer: Self-pay | Admitting: Oncology

## 2020-08-08 ENCOUNTER — Telehealth: Payer: Self-pay

## 2020-08-08 VITALS — BP 145/79 | HR 71 | Temp 96.9°F | Resp 20 | Wt 223.2 lb

## 2020-08-08 DIAGNOSIS — I509 Heart failure, unspecified: Secondary | ICD-10-CM | POA: Insufficient documentation

## 2020-08-08 DIAGNOSIS — Z79811 Long term (current) use of aromatase inhibitors: Secondary | ICD-10-CM | POA: Diagnosis not present

## 2020-08-08 DIAGNOSIS — Z87891 Personal history of nicotine dependence: Secondary | ICD-10-CM | POA: Diagnosis not present

## 2020-08-08 DIAGNOSIS — Z923 Personal history of irradiation: Secondary | ICD-10-CM | POA: Diagnosis not present

## 2020-08-08 DIAGNOSIS — Z801 Family history of malignant neoplasm of trachea, bronchus and lung: Secondary | ICD-10-CM | POA: Diagnosis not present

## 2020-08-08 DIAGNOSIS — D649 Anemia, unspecified: Secondary | ICD-10-CM

## 2020-08-08 DIAGNOSIS — Z803 Family history of malignant neoplasm of breast: Secondary | ICD-10-CM | POA: Insufficient documentation

## 2020-08-08 DIAGNOSIS — Z170 Estrogen receptor positive status [ER+]: Secondary | ICD-10-CM | POA: Diagnosis not present

## 2020-08-08 DIAGNOSIS — Z853 Personal history of malignant neoplasm of breast: Secondary | ICD-10-CM

## 2020-08-08 DIAGNOSIS — R0602 Shortness of breath: Secondary | ICD-10-CM | POA: Diagnosis not present

## 2020-08-08 DIAGNOSIS — C50911 Malignant neoplasm of unspecified site of right female breast: Secondary | ICD-10-CM | POA: Insufficient documentation

## 2020-08-08 DIAGNOSIS — M8580 Other specified disorders of bone density and structure, unspecified site: Secondary | ICD-10-CM | POA: Diagnosis not present

## 2020-08-08 DIAGNOSIS — E538 Deficiency of other specified B group vitamins: Secondary | ICD-10-CM

## 2020-08-08 DIAGNOSIS — M7989 Other specified soft tissue disorders: Secondary | ICD-10-CM | POA: Diagnosis not present

## 2020-08-08 DIAGNOSIS — D509 Iron deficiency anemia, unspecified: Secondary | ICD-10-CM | POA: Diagnosis not present

## 2020-08-08 DIAGNOSIS — I110 Hypertensive heart disease with heart failure: Secondary | ICD-10-CM | POA: Insufficient documentation

## 2020-08-08 LAB — COMPREHENSIVE METABOLIC PANEL
ALT: 24 U/L (ref 0–44)
AST: 27 U/L (ref 15–41)
Albumin: 4 g/dL (ref 3.5–5.0)
Alkaline Phosphatase: 76 U/L (ref 38–126)
Anion gap: 9 (ref 5–15)
BUN: 20 mg/dL (ref 8–23)
CO2: 28 mmol/L (ref 22–32)
Calcium: 9.7 mg/dL (ref 8.9–10.3)
Chloride: 101 mmol/L (ref 98–111)
Creatinine, Ser: 0.85 mg/dL (ref 0.44–1.00)
GFR, Estimated: >60 mL/min (ref >60)
Glucose, Bld: 137 mg/dL — ABNORMAL HIGH (ref 70–99)
Potassium: 3.8 mmol/L (ref 3.5–5.1)
Sodium: 138 mmol/L (ref 135–145)
Total Bilirubin: 0.3 mg/dL (ref 0.3–1.2)
Total Protein: 7.4 g/dL (ref 6.5–8.1)

## 2020-08-08 LAB — CBC WITH DIFFERENTIAL/PLATELET
Abs Immature Granulocytes: 0.05 10*3/uL (ref 0.00–0.07)
Basophils Absolute: 0.1 10*3/uL (ref 0.0–0.1)
Basophils Relative: 1 %
Eosinophils Absolute: 0.5 10*3/uL (ref 0.0–0.5)
Eosinophils Relative: 8 %
HCT: 37.4 % (ref 36.0–46.0)
Hemoglobin: 12.3 g/dL (ref 12.0–15.0)
Immature Granulocytes: 1 %
Lymphocytes Relative: 17 %
Lymphs Abs: 0.9 10*3/uL (ref 0.7–4.0)
MCH: 28.8 pg (ref 26.0–34.0)
MCHC: 32.9 g/dL (ref 30.0–36.0)
MCV: 87.6 fL (ref 80.0–100.0)
Monocytes Absolute: 0.6 10*3/uL (ref 0.1–1.0)
Monocytes Relative: 11 %
Neutro Abs: 3.4 10*3/uL (ref 1.7–7.7)
Neutrophils Relative %: 62 %
Platelets: 227 10*3/uL (ref 150–400)
RBC: 4.27 MIL/uL (ref 3.87–5.11)
RDW: 14.6 % (ref 11.5–15.5)
WBC: 5.5 10*3/uL (ref 4.0–10.5)
nRBC: 0 % (ref 0.0–0.2)

## 2020-08-08 LAB — FOLATE: Folate: >100 ng/mL (ref >5.9)

## 2020-08-08 NOTE — Progress Notes (Signed)
[facility]  [address], [address] [place], [place] [address] Phone: [phone]  Fax: [phone]   Clinic Day:  08/08/20  Referring physician: Venita Lick, NP  Chief Complaint: Melissa Merritt is a 70 y.o. female with stage IA right breast cancer who is seen for 1 month assessment on Femara.  HPI: The patient was last seen in the medical oncology clinic on 05/10/20 . At that time, nausea and vomiting had improved since stopping tamoxifen.  She was started on Femara.  During the interim, she has been "good." She has been tolerating Femara fair.  Reports recently developing bilateral lower extremity swelling and worsening shortness of breath.  Started a new inhaler for her chronic shortness of breath which seems to help some.  Unsure of what is causing lower extremity swelling but states it is causing her discomfort especially at bedtime.  Reports dark stools secondary to iron supplements.  Denies any GI upset.    She takes oral iron once daily with orange juice. She takes folic acid 1 mg daily. She takes Vitamin B12 every other day.  She received her dental clearance from Dr. Gloriann Loan.   Recently had a mammogram on 07/08/2020 which was negative.  Repeat in 1 year.    Past Medical History:  Diagnosis Date  . Benign tumor of lung   . Breast cancer ([place])   . Cancer [facility])    right breast  . CHF (congestive heart failure) ([place])   . Dyspnea   . GERD (gastroesophageal reflux disease)   . Hyperlipidemia   . Hypertension   . Insomnia   . Osteopenia   . Personal history of radiation therapy   . Plantar fasciitis   . PONV (postoperative nausea and vomiting)    vomiting 2 days after lung surgery  . S/P pneumonectomy    right lower lobe  . Urinary incontinence     Past Surgical History:  Procedure Laterality Date  . BREAST BIOPSY Right 07/17/2019   Affirm bx-"Ribbon" clip-path pending  . BREAST LUMPECTOMY    . bronchoscopy    . CATARACT EXTRACTION,  BILATERAL     Left- 11/21/15, right- 12/23/15  . CHOLECYSTECTOMY    . CYSTECTOMY  11/03/09   cheek  . dilation and curretage    . EYE SURGERY Bilateral    cataract  . FOOT SURGERY Bilateral 2010  . LOBECTOMY    . LUNG SURGERY Right   . PART MASTECTOMY,RADIO FREQUENCY LOCALIZER,AXILLARY SENTINEL NODE BIOPSY Right 08/13/2019   Procedure: PART MASTECTOMY,RADIO FREQUENCY LOCALIZER,AXILLARY SENTINEL NODE BIOPSY;  Surgeon: Benjamine Sprague, DO;  Location: [facility] ORS;  Service: General;  Laterality: Right;  . UPPER GI ENDOSCOPY  2005    Family History  Problem Relation Age of Onset  . COPD Mother   . Hypertension Mother   . Emphysema Mother   . Osteoporosis Mother   . Heart disease Father   . Hyperlipidemia Sister   . Hyperlipidemia Brother   . Heart disease Maternal Grandmother   . Heart disease Maternal Grandfather   . Heart disease Paternal Grandmother   . Stroke Paternal Grandmother   . Heart disease Paternal Grandfather   . Stroke Paternal Grandfather   . Cancer Maternal Aunt        breast and lung  . Osteoporosis Maternal Aunt   . Aortic aneurysm Maternal Aunt   . Breast cancer Maternal Aunt 76    Social History:  reports that she quit smoking about 41 years ago. She  has never used smokeless tobacco. She reports that she does not drink alcohol and does not use drugs. She has not been exposed to radiations or toxins. She is retired. She used to work at [employer]. Her husband is status post CVA.  She lives in [place]. The patient is alone today.   Allergies:  Allergies  Allergen Reactions  . Contrast Media [Iodinated Diagnostic Agents] Swelling    Eye swelling shut  . Other Swelling    Eye swelling shut IV contrast media   . Ace Inhibitors Other (See Comments)    "Makes me feel strange."  . Hctz [Hydrochlorothiazide] Other (See Comments)    arms ache  . Keflex [Cephalexin] Other (See Comments)    thrush    Current Medications: Current Outpatient Medications   Medication Sig Dispense Refill  . amLODipine (NORVASC) 5 MG tablet Take 1 tablet (5 mg total) by mouth daily. 90 tablet 4  . aspirin 81 MG tablet Take 81 mg by mouth daily.    . Biotin 5000 MCG TABS Take 5,000 mcg by mouth 2 (two) times daily.    . Calcium Carb-Cholecalciferol (CALCIUM-VITAMIN D) 600-400 MG-UNIT TABS Take 1 tablet by mouth 2 (two) times daily.    . cholecalciferol (VITAMIN D3) 25 MCG (1000 UNIT) tablet Take 1,000 Units by mouth in the morning and at bedtime.    . Cyanocobalamin (B-12) 2500 MCG TABS Take 2,500 mcg by mouth daily.    . ferrous sulfate 220 (44 Fe) MG/5ML solution Take 220 mg by mouth daily.    . folic acid (FOLVITE) 854 MCG tablet Take 400 mcg by mouth daily.    Marland Kitchen ibuprofen (ADVIL) 200 MG tablet Take 400 mg by mouth every 6 (six) hours as needed for moderate pain.     Marland Kitchen letrozole (FEMARA) 2.5 MG tablet Take 1 tablet (2.5 mg total) by mouth daily. 30 tablet 1  . metoprolol succinate (TOPROL-XL) 25 MG 24 hr tablet Take 1 tablet (25 mg total) by mouth daily. 90 tablet 4  . pantoprazole (PROTONIX) 40 MG tablet Take 1 tablet (40 mg total) by mouth 2 (two) times daily. 180 tablet 4  . Red Yeast Rice Extract (RED YEAST RICE PO) Take 1 tablet by mouth in the morning and at bedtime.    . St Johns Wort 300 MG CAPS Take 300 mg by mouth 2 (two) times daily.    . valsartan (DIOVAN) 160 MG tablet Take 1 tablet (160 mg total) by mouth daily. 90 tablet 4   No current facility-administered medications for this visit.    Review of Systems  Constitutional: Negative for chills, diaphoresis, fever, malaise/fatigue and weight loss (up 4 lbs).       Feels "good."  HENT: Negative for congestion, ear discharge, ear pain, hearing loss, nosebleeds, sinus pain, sore throat and tinnitus.   Eyes: Negative for blurred vision.  Respiratory: Positive for shortness of breath (on exertion, stable). Negative for cough, hemoptysis, sputum production and wheezing.        Sleep apnea.   Cardiovascular: Positive for leg swelling. Negative for chest pain, palpitations and orthopnea.  Gastrointestinal: Negative for abdominal pain, blood in stool, constipation, diarrhea, heartburn, melena, nausea and vomiting.       Eating well.  Genitourinary: Negative for dysuria, flank pain, frequency, hematuria and urgency.  Musculoskeletal: Negative for back pain, joint pain, myalgias and neck pain.  Skin:       Itchy rash underneath breast comes and goes.  Neurological: Negative for dizziness,  tingling, sensory change, weakness and headaches.  Endo/Heme/Allergies: Does not bruise/bleed easily.  Psychiatric/Behavioral: Negative for depression and memory loss. The patient has insomnia. The patient is not nervous/anxious.   All other systems reviewed and are negative.  Performance status (ECOG): 1  Vitals Blood pressure (!) 145/79, pulse 71, temperature (!) 96.9 F (36.1 C), temperature source Tympanic, resp. rate 20, weight 223 lb 3.5 oz (101.2 kg), SpO2 99 %.   Physical Exam Constitutional:      Appearance: She is well-developed.  HENT:     Head: Normocephalic and atraumatic.  Eyes:     Pupils: Pupils are equal, round, and reactive to light.  Cardiovascular:     Rate and Rhythm: Normal rate and regular rhythm.     Heart sounds: No murmur heard.   Pulmonary:     Effort: Pulmonary effort is normal.     Breath sounds: Normal breath sounds. No wheezing.  Abdominal:     General: Bowel sounds are normal. There is no distension.     Palpations: Abdomen is soft. There is no mass.     Tenderness: There is no abdominal tenderness.  Musculoskeletal:        General: Normal range of motion.     Cervical back: Normal range of motion.     Right lower leg: Edema present.     Left lower leg: Edema present.  Skin:    General: Skin is warm and dry.  Neurological:     Mental Status: She is alert and oriented to person, place, and time.  Psychiatric:        Behavior: Behavior normal.     Appointment on 08/08/2020  Component Date Value Ref Range Status  . WBC 08/08/2020 5.5  4.0 - 10.5 K/uL Final  . RBC 08/08/2020 4.27  3.87 - 5.11 MIL/uL Final  . Hemoglobin 08/08/2020 12.3  12.0 - 15.0 g/dL Final  . HCT 08/08/2020 37.4  36.0 - 46.0 % Final  . MCV 08/08/2020 87.6  80.0 - 100.0 fL Final  . MCH 08/08/2020 28.8  26.0 - 34.0 pg Final  . MCHC 08/08/2020 32.9  30.0 - 36.0 g/dL Final  . RDW 08/08/2020 14.6  11.5 - 15.5 % Final  . Platelets 08/08/2020 227  150 - 400 K/uL Final  . nRBC 08/08/2020 0.0  0.0 - 0.2 % Final  . Neutrophils Relative % 08/08/2020 62  % Final  . Neutro Abs 08/08/2020 3.4  1.7 - 7.7 K/uL Final  . Lymphocytes Relative 08/08/2020 17  % Final  . Lymphs Abs 08/08/2020 0.9  0.7 - 4.0 K/uL Final  . Monocytes Relative 08/08/2020 11  % Final  . Monocytes Absolute 08/08/2020 0.6  0.1 - 1.0 K/uL Final  . Eosinophils Relative 08/08/2020 8  % Final  . Eosinophils Absolute 08/08/2020 0.5  0.0 - 0.5 K/uL Final  . Basophils Relative 08/08/2020 1  % Final  . Basophils Absolute 08/08/2020 0.1  0.0 - 0.1 K/uL Final  . Immature Granulocytes 08/08/2020 1  % Final  . Abs Immature Granulocytes 08/08/2020 0.05  0.00 - 0.07 K/uL Final   Performed at [facility], [address], [place], [place] [address]    Assessment:  Melissa Merritt is a 70 y.o. female  with stage IA right breast cancer s/p partial mastectomy and right axillary sentinel lymph node biopsy on 08/13/2019.   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  There was atypical lobular hyperplasia, with  ductal involvement.  Additional posterior margin revealed no evidence of residual invasive carcinoma or DCIS.  Closest margin for invasive carcinoma was > 10 mm and 2 mm for DCIS.  One lymph node was negative for malignancy.  Tumor was ER + (91-100%), PR+ (91-100%), and Her2/neu -.  Pathologic stage was pT1a pN0 (sn).  She underwent stereotactic  biopsy on 07/17/2019.  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    She received 5040 cGy to her right breast and a 1400 cGy boost her scar from 09/28/2019- 11/17/2019.  She began tamoxifen on 11/27/2019. Tamoxifen was placed on hold on 03/28/2020 as it made her feel awful.  Bilateral diagnostic mammogram on 07/08/2019 revealed persistent architectural distortion involving the upper outer quadrant of the RIGHT breast without sonographic correlate.  There was no pathologic RIGHT axillary lymphadenopathy.  There was no mammographic or sonographic evidence of malignancy involving the LEFT breast.    Bone density on 08/04/2019 revealed osteopenia with a T score of -1.5 in the AP spine L2-L4.  She has history of B12 deficiency.  B12 was 1412 on 05/10/2020.  She is on oral B12  3 days/week. She has folate deficiency.  Folate was 5.2 on 03/28/2020 and 32.0 on 05/10/2020.  She began folic acid 1 mg a day on 03/28/2020. She has iron deficiency.  Ferritin was 100.  Iron saturation was 9% with a TIBC of 248 on 03/28/2020.  Sed rate was 56 on 04/12/2020.  She is on oral iron.  She has CHF and sleep apnea.  Echo on 07/02/2019 revealed an EF of 45%.  She is scheduled to have a root canal and a crown.  She received her last Moderna COVID-19 vaccine at the end or 06/2019.   Symptomatically, she feels fair.  Has developed worsening shortness of breath and bilateral lower extremity swelling worse at bedtime she feels is related to her Femara.  Reports starting the care program and attempt to lose some weight.  Plan: 1.   Labs today: LFTs, B12. folate level. 2.   Stage IA right breast cancer             She is s/p lumpectomy and SLN biopsy.             Pathology revealed a 4 mm grade 1 tumor.             Tumor is ER and PR positive and HER-2/neu negative.             Patient completed radiation therapy  on 11/17/2019.             She was on tamoxifen from 11/27/2019 - 03/28/2020.  Tamoxifen was discontinued secondary to multiple symptoms.   She began Femara on 04/12/2020.  Has developed some bilateral lower extremity swelling and worsening shortness of breath.  Unsure if it is related to Femara but recommended she hold for 1 week to see if symptoms resolve.  Per up-to-date, 7 to 18% of patients experience edema.  She will return to clinic in 1 week to discuss restarting versus switching AI's.  Her CA 27-29 is 20.2 which is normal.   3.   Osteopenia             She has a T score of -1.5 in the AP spine.  Continue calcium and vitamin D.  Received dental clearance from Dr. Gloriann Loan today.  Plan to start  Prolia next week.  She will receive this every 6 months.  BMP first.   Repeat bone density in April 2023. 4.   Iron deficiency anemia  Labs from 08/08/2020 show a hemoglobin of 12.3.  Last ferritin is from 03/28/2020 which was 100 and iron saturation 9%.  Ferritin is falsely elevated secondary to high sed rate (56).  She is on oral iron (began 03/28/2020).  Repeat labs at next visit. 5.   B12 deficiency  B12 was 6729 (high) on 03/28/2020 and 1412 on 05/10/2020.  She was instructed to hold B12 x 3 weeks then begin B12 3 days a week.  She is currently taking B12 3 days a week.  Continue to monitor. 6.   Folate deficiency  Folate was 5.2 on 03/28/2020.  She began folic acid 1 mg a day on 59/40/9050  Folic acid was reduced to 3 times per week on 05/10/2020.  Labs from 08/08/2020 show a folate level greater than 100.  Discontinue folic acid.    Will recheck at next visit.  Disposition: Stop Femara x1 week. Stop folic acid supplement. RTC in 1 week to discuss symptoms and to initiate Prolia with labs (BMP, ferritin, iron) RTC in 3 months for MD assessment and labs (CBC with diff,CMP, folate, CA27.29).  I discussed the assessment and treatment plan with the patient.  The patient was provided an  opportunity to ask questions and all were answered.  The patient agreed with the plan and demonstrated an understanding of the instructions.  The patient was advised to call back if the symptoms worsen or if the condition fails to improve as anticipated.  Greater than 50% was spent in counseling and coordination of care with this patient including but not limited to discussion of the relevant topics above (See A&P) including, but not limited to diagnosis and management of acute and chronic medical conditions.   Faythe Casa, NP 08/09/2020 11:22 AM

## 2020-08-08 NOTE — Progress Notes (Signed)
Patient here for oncology follow-up appointment, expresses concerns of shortness of breath, feet swelling/ numbness

## 2020-08-09 ENCOUNTER — Encounter: Payer: Medicare Other | Attending: Nurse Practitioner

## 2020-08-09 DIAGNOSIS — Z853 Personal history of malignant neoplasm of breast: Secondary | ICD-10-CM | POA: Insufficient documentation

## 2020-08-09 LAB — CANCER ANTIGEN 27.29: CA 27.29: 20.2 U/mL (ref 0.0–38.6)

## 2020-08-09 NOTE — Progress Notes (Signed)
Daily Session Note  Patient Details  Name: Melissa Merritt MRN: [id number] Date of Birth: 1950-09-15 Referring Provider:   April [facility] from 06/28/2020 in [facility] Cardiac and Pulmonary Rehab  Referring Provider [name], NP      Encounter Date: 08/09/2020  Check In:  Session Check In - 08/09/20 1242      Check-In   Supervising physician immediately available to respond to emergencies See telemetry face sheet for immediately available ER MD    Location [facility]-Cardiac & Pulmonary Rehab    Staff Present Birdie Sons, MPA, RN;Jessica Luan Pulling, MA, RCEP, CCRP, Marylynn Pearson, MS Exercise Physiologist    Virtual Visit No    Medication changes reported     No    Fall or balance concerns reported    No    Tobacco Cessation No Change    Warm-up and Cool-down Performed on first and last piece of equipment    Resistance Training Performed Yes    VAD Patient? No    PAD/SET Patient? No      Pain Assessment   Currently in Pain? No/denies              Social History   Tobacco Use  Smoking Status Former Smoker  . Quit date: 07/09/1979  . Years since quitting: 41.1  Smokeless Tobacco Never Used    Goals Met:  Independence with exercise equipment Exercise tolerated well No report of cardiac concerns or symptoms Strength training completed today  Goals Unmet:  Not Applicable  Comments: Pt able to follow exercise prescription today without complaint.  Will continue to monitor for progression.    Dr. Emily Filbert is Medical Director for [facility].

## 2020-08-11 ENCOUNTER — Other Ambulatory Visit: Payer: Self-pay

## 2020-08-11 DIAGNOSIS — Z853 Personal history of malignant neoplasm of breast: Secondary | ICD-10-CM

## 2020-08-11 NOTE — Progress Notes (Signed)
Daily Session Note  Patient Details  Name: Melissa Merritt MRN: [id number] Date of Birth: 1950/05/06 Referring Provider:   April Manson [facility] Exercise from 06/28/2020 in [facility] Cardiac and Pulmonary Rehab  Referring Provider [name], NP      Encounter Date: 08/11/2020  Check In:  Session Check In - 08/11/20 1259      Check-In   Supervising physician immediately available to respond to emergencies See telemetry face sheet for immediately available ER MD    Location [facility]-Cardiac & Pulmonary Rehab    Staff Present Birdie Sons, MPA, Elveria Rising, BA, ACSM CEP, Exercise Physiologist;Kara Eliezer Bottom, MS Exercise Physiologist    Virtual Visit No    Medication changes reported     No    Fall or balance concerns reported    No    Tobacco Cessation No Change    Warm-up and Cool-down Performed on first and last piece of equipment    Resistance Training Performed Yes    VAD Patient? No    PAD/SET Patient? No      Pain Assessment   Currently in Pain? No/denies              Social History   Tobacco Use  Smoking Status Former Smoker  . Quit date: 07/09/1979  . Years since quitting: 41.1  Smokeless Tobacco Never Used    Goals Met:  Independence with exercise equipment Exercise tolerated well No report of cardiac concerns or symptoms Strength training completed today  Goals Unmet:  Not Applicable  Comments: Pt able to follow exercise prescription today without complaint.  Will continue to monitor for progression.    Dr. Emily Filbert is Medical Director for [facility].

## 2020-08-12 ENCOUNTER — Other Ambulatory Visit: Payer: Self-pay

## 2020-08-15 ENCOUNTER — Inpatient Hospital Stay: Payer: Medicare Other

## 2020-08-15 ENCOUNTER — Encounter: Payer: Self-pay | Admitting: Nurse Practitioner

## 2020-08-15 ENCOUNTER — Other Ambulatory Visit: Payer: Self-pay

## 2020-08-15 ENCOUNTER — Inpatient Hospital Stay (HOSPITAL_BASED_OUTPATIENT_CLINIC_OR_DEPARTMENT_OTHER): Payer: Medicare Other | Admitting: Nurse Practitioner

## 2020-08-15 VITALS — BP 146/68 | HR 77 | Temp 97.8°F | Resp 20 | Wt 227.1 lb

## 2020-08-15 DIAGNOSIS — M8580 Other specified disorders of bone density and structure, unspecified site: Secondary | ICD-10-CM | POA: Diagnosis not present

## 2020-08-15 DIAGNOSIS — Z853 Personal history of malignant neoplasm of breast: Secondary | ICD-10-CM | POA: Diagnosis not present

## 2020-08-15 DIAGNOSIS — M7989 Other specified soft tissue disorders: Secondary | ICD-10-CM | POA: Diagnosis not present

## 2020-08-15 DIAGNOSIS — E538 Deficiency of other specified B group vitamins: Secondary | ICD-10-CM

## 2020-08-15 DIAGNOSIS — C50911 Malignant neoplasm of unspecified site of right female breast: Secondary | ICD-10-CM | POA: Diagnosis not present

## 2020-08-15 DIAGNOSIS — Z170 Estrogen receptor positive status [ER+]: Secondary | ICD-10-CM | POA: Diagnosis not present

## 2020-08-15 DIAGNOSIS — M8588 Other specified disorders of bone density and structure, other site: Secondary | ICD-10-CM

## 2020-08-15 DIAGNOSIS — Z79811 Long term (current) use of aromatase inhibitors: Secondary | ICD-10-CM | POA: Diagnosis not present

## 2020-08-15 DIAGNOSIS — R0602 Shortness of breath: Secondary | ICD-10-CM | POA: Diagnosis not present

## 2020-08-15 DIAGNOSIS — D649 Anemia, unspecified: Secondary | ICD-10-CM

## 2020-08-15 LAB — IRON AND TIBC
Iron: 39 ug/dL (ref 28–170)
Saturation Ratios: 12 % (ref 10.4–31.8)
TIBC: 314 ug/dL (ref 250–450)
UIBC: 275 ug/dL

## 2020-08-15 LAB — BASIC METABOLIC PANEL
Anion gap: 7 (ref 5–15)
BUN: 16 mg/dL (ref 8–23)
CO2: 25 mmol/L (ref 22–32)
Calcium: 9.2 mg/dL (ref 8.9–10.3)
Chloride: 106 mmol/L (ref 98–111)
Creatinine, Ser: 0.79 mg/dL (ref 0.44–1.00)
GFR, Estimated: >60 mL/min (ref >60)
Glucose, Bld: 169 mg/dL — ABNORMAL HIGH (ref 70–99)
Potassium: 3.6 mmol/L (ref 3.5–5.1)
Sodium: 138 mmol/L (ref 135–145)

## 2020-08-15 LAB — FERRITIN: Ferritin: 82 ng/mL (ref 11–307)

## 2020-08-15 LAB — FOLATE: Folate: 72 ng/mL (ref >5.9)

## 2020-08-15 MED ORDER — DENOSUMAB 60 MG/ML ~~LOC~~ SOSY
60.0000 mg | PREFILLED_SYRINGE | Freq: Once | SUBCUTANEOUS | Status: AC
  Administered 2020-08-15: 60 mg via SUBCUTANEOUS

## 2020-08-15 NOTE — Progress Notes (Signed)
[facility]  [address] [place], [place] [address] Phone: [phone]  Fax: [phone]   Clinic Day:  08/15/20  Referring physician: Venita Lick, NP  Chief Complaint: stage IA right breast cancer currently on femara for follow up  Oncology History:  Melissa Merritt is a 70 y.o. female  with stage IA right breast cancer s/p partial mastectomy and right axillary sentinel lymph node biopsy on 08/13/2019.   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  There was atypical lobular hyperplasia, with ductal involvement.  Additional posterior margin revealed no evidence of residual invasive carcinoma or DCIS.  Closest margin for invasive carcinoma was > 10 mm and 2 mm for DCIS.  One lymph node was negative for malignancy.  Tumor was ER + (91-100%), PR+ (91-100%), and Her2/neu -.  Pathologic stage was pT1a pN0 (sn).  She underwent stereotactic biopsy on 07/17/2019.  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    She received 5040 cGy to her right breast and a 1400 cGy boost her scar from 09/28/2019- 11/17/2019.  She began tamoxifen on 11/27/2019. Tamoxifen was placed on hold on 03/28/2020 as it made her feel awful.  Bilateral diagnostic mammogram on 07/08/2019 revealed persistent architectural distortion involving the upper outer quadrant of the RIGHT breast without sonographic correlate.  There was no pathologic RIGHT axillary lymphadenopathy.  There was no mammographic or sonographic evidence of malignancy involving the LEFT breast.    Bone density on 08/04/2019 revealed osteopenia with a T score of -1.5 in the AP spine L2-L4.  She has history of B12 deficiency.  B12 was 1412 on 05/10/2020.  She is on oral B12  3 days/week. She has folate deficiency.  Folate was 5.2 on 03/28/2020 and 32.0 on  05/10/2020.  She began folic acid 1 mg a day on 03/28/2020. She has iron deficiency.  Ferritin was 100.  Iron saturation was 9% with a TIBC of 248 on 03/28/2020.  Sed rate was 56 on 04/12/2020.  She is on oral iron.  She has CHF and sleep apnea.  Echo on 07/02/2019 revealed an EF of 45%.  She is scheduled to have a root canal and a crown.  She received her last Moderna COVID-19 vaccine at the end or 06/2019.   HPI: Patient is 70 year old female with history of stage Ia right breast cancer, currently on Femara, who returns for reevaluation of shortness of breath and lower extremity edema.  She was seen on 08/08/2020.  She has been holding Femara.  Leg swelling seems improved.  Has not seen cardiology recently.  Shortness of breath with exertion.  Weight increase. She has received dental clearance and anticipates starting Prolia today. She has no neurologic complaints.  She denies any recent fevers or illnesses.  She has a good appetite and denies weight loss.  She denies any chest pain, shortness of breath, cough, or hemoptysis. She denies any nausea, vomiting, constipation, or diarrhea.  She has no melena or hematochezia.  She has no urinary complaints.  Patient offers no specific complaints today.   Past Medical History:  Diagnosis Date  . Benign tumor of lung   . Breast cancer ([place])   . Cancer [facility])    right breast  . CHF (congestive heart failure) ([place])   . Dyspnea   . GERD (gastroesophageal reflux disease)   . Hyperlipidemia   .  Hypertension   . Insomnia   . Osteopenia   . Personal history of radiation therapy   . Plantar fasciitis   . PONV (postoperative nausea and vomiting)    vomiting 2 days after lung surgery  . S/P pneumonectomy    right lower lobe  . Urinary incontinence     Past Surgical History:  Procedure Laterality Date  . BREAST BIOPSY Right 07/17/2019   Affirm bx-"Ribbon" clip-path pending  . BREAST LUMPECTOMY    . bronchoscopy    . CATARACT EXTRACTION, BILATERAL      Left- 11/21/15, right- 12/23/15  . CHOLECYSTECTOMY    . CYSTECTOMY  11/03/09   cheek  . dilation and curretage    . EYE SURGERY Bilateral    cataract  . FOOT SURGERY Bilateral 2010  . LOBECTOMY    . LUNG SURGERY Right   . PART MASTECTOMY,RADIO FREQUENCY LOCALIZER,AXILLARY SENTINEL NODE BIOPSY Right 08/13/2019   Procedure: PART MASTECTOMY,RADIO FREQUENCY LOCALIZER,AXILLARY SENTINEL NODE BIOPSY;  Surgeon: Benjamine Sprague, DO;  Location: [facility] ORS;  Service: General;  Laterality: Right;  . UPPER GI ENDOSCOPY  2005    Family History  Problem Relation Age of Onset  . COPD Mother   . Hypertension Mother   . Emphysema Mother   . Osteoporosis Mother   . Heart disease Father   . Hyperlipidemia Sister   . Hyperlipidemia Brother   . Heart disease Maternal Grandmother   . Heart disease Maternal Grandfather   . Heart disease Paternal Grandmother   . Stroke Paternal Grandmother   . Heart disease Paternal Grandfather   . Stroke Paternal Grandfather   . Cancer Maternal Aunt        breast and lung  . Osteoporosis Maternal Aunt   . Aortic aneurysm Maternal Aunt   . Breast cancer Maternal Aunt 76    Social History:  reports that she quit smoking about 41 years ago. She has never used smokeless tobacco. She reports that she does not drink alcohol and does not use drugs. She has not been exposed to radiations or toxins. She is retired. She used to work at [employer]. Her husband is status post CVA.  She lives in [place].   Allergies:  Allergies  Allergen Reactions  . Contrast Media [Iodinated Diagnostic Agents] Swelling    Eye swelling shut  . Other Swelling    Eye swelling shut IV contrast media   . Ace Inhibitors Other (See Comments)    "Makes me feel strange."  . Hctz [Hydrochlorothiazide] Other (See Comments)    arms ache  . Keflex [Cephalexin] Other (See Comments)    thrush    Current Medications: Current Outpatient Medications  Medication Sig Dispense Refill  . amLODipine  (NORVASC) 5 MG tablet Take 1 tablet (5 mg total) by mouth daily. 90 tablet 4  . aspirin 81 MG tablet Take 81 mg by mouth daily.    . Biotin 5000 MCG TABS Take 5,000 mcg by mouth 2 (two) times daily.    . Calcium Carb-Cholecalciferol (CALCIUM-VITAMIN D) 600-400 MG-UNIT TABS Take 1 tablet by mouth 2 (two) times daily.    . cholecalciferol (VITAMIN D3) 25 MCG (1000 UNIT) tablet Take 1,000 Units by mouth in the morning and at bedtime.    . Cyanocobalamin (B-12) 2500 MCG TABS Take 2,500 mcg by mouth daily.    . ferrous sulfate 220 (44 Fe) MG/5ML solution Take 220 mg by mouth daily.    . folic acid (FOLVITE) 300 MCG tablet Take 400 mcg  by mouth daily.    Marland Kitchen ibuprofen (ADVIL) 200 MG tablet Take 400 mg by mouth every 6 (six) hours as needed for moderate pain.     Marland Kitchen letrozole (FEMARA) 2.5 MG tablet Take 1 tablet (2.5 mg total) by mouth daily. 30 tablet 1  . metoprolol succinate (TOPROL-XL) 25 MG 24 hr tablet Take 1 tablet (25 mg total) by mouth daily. 90 tablet 4  . pantoprazole (PROTONIX) 40 MG tablet Take 1 tablet (40 mg total) by mouth 2 (two) times daily. 180 tablet 4  . Red Yeast Rice Extract (RED YEAST RICE PO) Take 1 tablet by mouth in the morning and at bedtime.    . St Johns Wort 300 MG CAPS Take 300 mg by mouth 2 (two) times daily.    . valsartan (DIOVAN) 160 MG tablet Take 1 tablet (160 mg total) by mouth daily. 90 tablet 4   No current facility-administered medications for this visit.    Review of Systems  Constitutional: Negative for chills, fever, malaise/fatigue and weight loss.       Weight gain  HENT: Negative for hearing loss, nosebleeds, sore throat and tinnitus.   Eyes: Negative for blurred vision and double vision.  Respiratory: Negative for cough, hemoptysis, shortness of breath (sob w/ exertion) and wheezing.   Cardiovascular: Positive for leg swelling. Negative for chest pain and palpitations.  Gastrointestinal: Negative for abdominal pain, blood in stool, constipation,  diarrhea, melena, nausea and vomiting.  Genitourinary: Negative for dysuria and urgency.  Musculoskeletal: Negative for back pain, falls, joint pain and myalgias.  Skin: Negative for itching and rash.  Neurological: Negative for dizziness, tingling, sensory change, loss of consciousness, weakness and headaches.  Endo/Heme/Allergies: Negative for environmental allergies. Does not bruise/bleed easily.  Psychiatric/Behavioral: Negative for depression. The patient is not nervous/anxious and does not have insomnia.    Performance status (ECOG): 1  Vitals Blood pressure (!) 146/68, pulse 77, temperature 97.8 F (36.6 C), resp. rate 20, weight 227 lb 1.2 oz (103 kg), SpO2 100 %.  General: Well-developed, well-nourished, no acute distress. Eyes: Pink conjunctiva, anicteric sclera. Lungs: Clear to auscultation bilaterally.  No audible wheezing or coughing Heart: Regular rate and rhythm. 2+ edema on left. 1+ on right.  Abdomen: Soft, nontender, nondistended.  Musculoskeletal: No edema, cyanosis, or clubbing. Neuro: Alert, answering all questions appropriately. Cranial nerves grossly intact. Skin: No rashes or petechiae noted. Psych: Normal affect.   No visits with results within 3 Day(s) from this visit.  Latest known visit with results is:  Appointment on 08/08/2020  Component Date Value Ref Range Status  . Folate 08/08/2020 >100.0  >5.9 ng/mL Final   Comment: RESULT CONFIRMED BY MANUAL DILUTION KLW Performed at [facility], [address] [place], [place] [address]   . CA 27.29 08/08/2020 20.2  0.0 - 38.6 U/mL Final   Comment: (NOTE) Siemens Centaur Immunochemiluminometric Methodology [facility]) Values obtained with different assay methods or kits cannot be used interchangeably. Results cannot be interpreted as absolute evidence of the presence or absence of malignant disease. Performed At: [facility] [place], [place] [id number] Rush Farmer MD  BT:[id number]   . Sodium 08/08/2020 138  135 - 145 mmol/L Final  . Potassium 08/08/2020 3.8  3.5 - 5.1 mmol/L Final  . Chloride 08/08/2020 101  98 - 111 mmol/L Final  . CO2 08/08/2020 28  22 - 32 mmol/L Final  . Glucose, Bld 08/08/2020 137* 70 - 99 mg/dL Final   Glucose reference range applies  only to samples taken after fasting for at least 8 hours.  . BUN 08/08/2020 20  8 - 23 mg/dL Final  . Creatinine, Ser 08/08/2020 0.85  0.44 - 1.00 mg/dL Final  . Calcium 08/08/2020 9.7  8.9 - 10.3 mg/dL Final  . Total Protein 08/08/2020 7.4  6.5 - 8.1 g/dL Final  . Albumin 08/08/2020 4.0  3.5 - 5.0 g/dL Final  . AST 08/08/2020 27  15 - 41 U/L Final  . ALT 08/08/2020 24  0 - 44 U/L Final  . Alkaline Phosphatase 08/08/2020 76  38 - 126 U/L Final  . Total Bilirubin 08/08/2020 0.3  0.3 - 1.2 mg/dL Final  . GFR, Estimated 08/08/2020 >60  >60 mL/min Final   Comment: (NOTE) Calculated using the CKD-EPI Creatinine Equation (2021)   . Anion gap 08/08/2020 9  5 - 15 Final   Performed at [facility], [address] [place], [place] [address]  . WBC 08/08/2020 5.5  4.0 - 10.5 K/uL Final  . RBC 08/08/2020 4.27  3.87 - 5.11 MIL/uL Final  . Hemoglobin 08/08/2020 12.3  12.0 - 15.0 g/dL Final  . HCT 08/08/2020 37.4  36.0 - 46.0 % Final  . MCV 08/08/2020 87.6  80.0 - 100.0 fL Final  . MCH 08/08/2020 28.8  26.0 - 34.0 pg Final  . MCHC 08/08/2020 32.9  30.0 - 36.0 g/dL Final  . RDW 08/08/2020 14.6  11.5 - 15.5 % Final  . Platelets 08/08/2020 227  150 - 400 K/uL Final  . nRBC 08/08/2020 0.0  0.0 - 0.2 % Final  . Neutrophils Relative % 08/08/2020 62  % Final  . Neutro Abs 08/08/2020 3.4  1.7 - 7.7 K/uL Final  . Lymphocytes Relative 08/08/2020 17  % Final  . Lymphs Abs 08/08/2020 0.9  0.7 - 4.0 K/uL Final  . Monocytes Relative 08/08/2020 11  % Final  . Monocytes Absolute 08/08/2020 0.6  0.1 - 1.0 K/uL Final  . Eosinophils Relative 08/08/2020 8  % Final  . Eosinophils Absolute 08/08/2020 0.5  0.0  - 0.5 K/uL Final  . Basophils Relative 08/08/2020 1  % Final  . Basophils Absolute 08/08/2020 0.1  0.0 - 0.1 K/uL Final  . Immature Granulocytes 08/08/2020 1  % Final  . Abs Immature Granulocytes 08/08/2020 0.05  0.00 - 0.07 K/uL Final   Performed at [facility], [address] [place], [place] [address]    Assessment: Patient is 70 year old female who returns to clinic for follow-up.  1. stage Ia right breast cancer status postlumpectomy and sentinel lymph node biopsy.  Pathology revealed a 4 mm grade 1 tumor, ER/PR positive, HER2/neu negative.  Completed adjuvant radiation therapy on 11/17/2019.  Started tamoxifen on 11/27/2019.  Discontinued in December secondary to multiple complaints.  Initiated letrozole on 04/12/20.  Given ER/PR positivity of her malignancy she would benefit from at least 5 years of adjuvant endocrine therapy completing in 11/2024.  Last mammogram on 07/08/2020 was reported as negative.  Plan to repeat diagnostic mammogram in April 2023.  CA 27.29 remains normal though not elevated at time of diagnosis.  We reviewed NCCN surveillance guidelines including history and physical exam every 3 months for the first 2 years.  Clinically no evidence of recurrent disease today.  Plan to return in 3 months for continued surveillance. Can also alternate with surgery.   2.  Osteopenia-bone density scan in April 2021 consistent with osteopenia.  Labs reviewed and acceptable for initiation of Prolia today.  She will receive this every 6 months-Next around November 2022.  She has received dental clearance from Dr. Gloriann Loan.  Continue calcium 1200 mg and vitamin D 1000 IU daily along with weightbearing exercise as tolerated.  Plan to repeat bone density in April 2023  3.  Iron deficiency anemia-history of iron deficiency anemia.  On oral iron.  Not anemic on most recent blood work.  Iron studies pending today.  Continue oral iron in the interim.  4.  B12 deficiency.  History of B12  deficiency.  Currently on oral B12 3 times a week.  Continue.  Plan to recheck in August 2022 for if macrocytosis, anemia is evident or symptomatic  5.  Folate deficiency-results pending today.   6.  Leg swelling-unclear if medication side effect versus cardiac etiology.  Recommended that she be seen by her cardiologist.  Prior EF 45% on 2021 echo.  Slightly improved holding letrozole.  Advised patient to continue to hold letrozole and contact cardiology for evaluation.  If cardiac etiology could consider retrial and letrozole.  If not cardiac etiology recommend stopping letrozole and switching to steroidal aromatase inhibitor such as Aromasin.  Patient agreeable with this plan and she will contact cardiology for evaluation and call us with outcome.   Disposition: Prolia today. Return to clinic in 3 months for labs (cbc, cmp), MD/NP Can also consider alternating surveillance visit with surgery.  I discussed the assessment and treatment plan with the patient.  The patient was provided an opportunity to ask questions and all were answered.  The patient agreed with the plan and demonstrated an understanding of the instructions.  The patient was advised to call back if the symptoms worsen or if the condition fails to improve as anticipated.  [name], DNP, AGNP-C [place] at [facility] [phone] (clinic)  CC: Clabe Seal (cardiology)

## 2020-08-15 NOTE — Patient Instructions (Signed)
Denosumab injection What is this medicine? DENOSUMAB (den oh sue mab) slows bone breakdown. Prolia is used to treat osteoporosis in women after menopause and in men, and in people who are taking corticosteroids for 6 months or more. Xgeva is used to treat a high calcium level due to cancer and to prevent bone fractures and other bone problems caused by multiple myeloma or cancer bone metastases. Xgeva is also used to treat giant cell tumor of the bone. This medicine may be used for other purposes; ask your health care provider or pharmacist if you have questions. COMMON BRAND NAME(S): Prolia, XGEVA What should I tell my health care provider before I take this medicine? They need to know if you have any of these conditions:  dental disease  having surgery or tooth extraction  infection  kidney disease  low levels of calcium or Vitamin D in the blood  malnutrition  on hemodialysis  skin conditions or sensitivity  thyroid or parathyroid disease  an unusual reaction to denosumab, other medicines, foods, dyes, or preservatives  pregnant or trying to get pregnant  breast-feeding How should I use this medicine? This medicine is for injection under the skin. It is given by a health care professional in a hospital or clinic setting. A special MedGuide will be given to you before each treatment. Be sure to read this information carefully each time. For Prolia, talk to your pediatrician regarding the use of this medicine in children. Special care may be needed. For Xgeva, talk to your pediatrician regarding the use of this medicine in children. While this drug may be prescribed for children as young as 13 years for selected conditions, precautions do apply. Overdosage: If you think you have taken too much of this medicine contact a poison control center or emergency room at once. NOTE: This medicine is only for you. Do not share this medicine with others. What if I miss a dose? It is  important not to miss your dose. Call your doctor or health care professional if you are unable to keep an appointment. What may interact with this medicine? Do not take this medicine with any of the following medications:  other medicines containing denosumab This medicine may also interact with the following medications:  medicines that lower your chance of fighting infection  steroid medicines like prednisone or cortisone This list may not describe all possible interactions. Give your health care provider a list of all the medicines, herbs, non-prescription drugs, or dietary supplements you use. Also tell them if you smoke, drink alcohol, or use illegal drugs. Some items may interact with your medicine. What should I watch for while using this medicine? Visit your doctor or health care professional for regular checks on your progress. Your doctor or health care professional may order blood tests and other tests to see how you are doing. Call your doctor or health care professional for advice if you get a fever, chills or sore throat, or other symptoms of a cold or flu. Do not treat yourself. This drug may decrease your body's ability to fight infection. Try to avoid being around people who are sick. You should make sure you get enough calcium and vitamin D while you are taking this medicine, unless your doctor tells you not to. Discuss the foods you eat and the vitamins you take with your health care professional. See your dentist regularly. Brush and floss your teeth as directed. Before you have any dental work done, tell your dentist you are   receiving this medicine. Do not become pregnant while taking this medicine or for 5 months after stopping it. Talk with your doctor or health care professional about your birth control options while taking this medicine. Women should inform their doctor if they wish to become pregnant or think they might be pregnant. There is a potential for serious side  effects to an unborn child. Talk to your health care professional or pharmacist for more information. What side effects may I notice from receiving this medicine? Side effects that you should report to your doctor or health care professional as soon as possible:  allergic reactions like skin rash, itching or hives, swelling of the face, lips, or tongue  bone pain  breathing problems  dizziness  jaw pain, especially after dental work  redness, blistering, peeling of the skin  signs and symptoms of infection like fever or chills; cough; sore throat; pain or trouble passing urine  signs of low calcium like fast heartbeat, muscle cramps or muscle pain; pain, tingling, numbness in the hands or feet; seizures  unusual bleeding or bruising  unusually weak or tired Side effects that usually do not require medical attention (report to your doctor or health care professional if they continue or are bothersome):  constipation  diarrhea  headache  joint pain  loss of appetite  muscle pain  runny nose  tiredness  upset stomach This list may not describe all possible side effects. Call your doctor for medical advice about side effects. You may report side effects to FDA at 1-800-FDA-1088. Where should I keep my medicine? This medicine is only given in a clinic, doctor's office, or other health care setting and will not be stored at home. NOTE: This sheet is a summary. It may not cover all possible information. If you have questions about this medicine, talk to your doctor, pharmacist, or health care provider.  2021 Elsevier/Gold Standard (2017-08-02 16:10:44)

## 2020-08-16 ENCOUNTER — Telehealth: Payer: Self-pay

## 2020-08-16 NOTE — Telephone Encounter (Signed)
Informed pt of normal folate levels. Pt is satisfied.

## 2020-08-17 ENCOUNTER — Other Ambulatory Visit
Admission: RE | Admit: 2020-08-17 | Discharge: 2020-08-17 | Disposition: A | Discharge status: Home / Self Care | Payer: Medicare Other | Source: Ambulatory Visit | Attending: Physician Assistant | Admitting: Physician Assistant

## 2020-08-17 DIAGNOSIS — R0602 Shortness of breath: Secondary | ICD-10-CM | POA: Diagnosis not present

## 2020-08-17 DIAGNOSIS — R609 Edema, unspecified: Secondary | ICD-10-CM | POA: Diagnosis not present

## 2020-08-17 DIAGNOSIS — I519 Heart disease, unspecified: Secondary | ICD-10-CM | POA: Diagnosis not present

## 2020-08-17 LAB — BRAIN NATRIURETIC PEPTIDE: B Natriuretic Peptide: 9.2 pg/mL (ref 0.0–100.0)

## 2020-08-18 ENCOUNTER — Other Ambulatory Visit: Payer: Self-pay

## 2020-08-18 DIAGNOSIS — Z853 Personal history of malignant neoplasm of breast: Secondary | ICD-10-CM

## 2020-08-18 NOTE — Progress Notes (Signed)
Daily Session Note  Patient Details  Name: Melissa Merritt MRN: [id number] Date of Birth: October 08, 1950 Referring Provider:   April Manson Cancer Associated Rehabilitation & Exercise from 06/28/2020 in [facility] Cardiac and Pulmonary Rehab  Referring Provider [name], NP      Encounter Date: 08/18/2020  Check In:  Session Check In - 08/18/20 1247      Check-In   Supervising physician immediately available to respond to emergencies See telemetry face sheet for immediately available ER MD    Location [facility]-Cardiac & Pulmonary Rehab    Staff Present Nada Maclachlan, BA, ACSM CEP, Exercise Physiologist;Kara Eliezer Bottom, MS Exercise Physiologist;Kobey Sides Rosalia Hammers, MPA, RN    Virtual Visit No    Medication changes reported     No    Fall or balance concerns reported    No    Tobacco Cessation No Change    Warm-up and Cool-down Performed on first and last piece of equipment    Resistance Training Performed Yes    VAD Patient? No    PAD/SET Patient? No      Pain Assessment   Currently in Pain? No/denies              Social History   Tobacco Use  Smoking Status Former Smoker  . Quit date: 07/09/1979  . Years since quitting: 41.1  Smokeless Tobacco Never Used    Goals Met:  Independence with exercise equipment Exercise tolerated well No report of cardiac concerns or symptoms Strength training completed today  Goals Unmet:  Not Applicable  Comments: Pt able to follow exercise prescription today without complaint.  Will continue to monitor for progression.  Melissa Merritt saw her cardiologist yesterday due to increasing SOB and swelling? She is scheduled to get an echo in the next couple of weeks. We called Nonnie Done, PA's office to make sure patient is cleared to exercise. Called office and received the OK for patient to exercise. Patient is aware to let us know if she is experiencing any abnormal symptoms.    Dr. Emily Filbert is Medical Director for [place] and  [facility].

## 2020-08-23 ENCOUNTER — Other Ambulatory Visit: Payer: Self-pay

## 2020-08-23 DIAGNOSIS — Z853 Personal history of malignant neoplasm of breast: Secondary | ICD-10-CM

## 2020-08-23 NOTE — Progress Notes (Signed)
Daily Session Note  Patient Details  Name: Melissa Merritt MRN: [id number] Date of Birth: 15-Apr-1950 Referring Provider:   April Manson Cancer Associated Rehabilitation & Exercise from 06/28/2020 in [facility] Cardiac and Pulmonary Rehab  Referring Provider [name], NP      Encounter Date: 08/23/2020  Check In:  Session Check In - 08/23/20 1221      Check-In   Supervising physician immediately available to respond to emergencies See telemetry face sheet for immediately available ER MD    Location [facility]-Cardiac & Pulmonary Rehab    Staff Present Birdie Sons, MPA, RN;Amanda Oletta Darter, BA, ACSM CEP, Exercise Physiologist;Jessica Marvel, MA, RCEP, CCRP, CCET    Virtual Visit No    Medication changes reported     No    Fall or balance concerns reported    No    Tobacco Cessation No Change    Warm-up and Cool-down Performed on first and last piece of equipment    Resistance Training Performed Yes    VAD Patient? No    PAD/SET Patient? No      Pain Assessment   Currently in Pain? No/denies              Social History   Tobacco Use  Smoking Status Former Smoker  . Quit date: 07/09/1979  . Years since quitting: 41.1  Smokeless Tobacco Never Used    Goals Met:  Independence with exercise equipment Exercise tolerated well No report of cardiac concerns or symptoms Strength training completed today  Goals Unmet:  Not Applicable  Comments: Pt able to follow exercise prescription today without complaint.  Will continue to monitor for progression.    Dr. Emily Filbert is Medical Director for [place] and [facility].

## 2020-08-24 DIAGNOSIS — R609 Edema, unspecified: Secondary | ICD-10-CM | POA: Diagnosis not present

## 2020-08-24 DIAGNOSIS — I519 Heart disease, unspecified: Secondary | ICD-10-CM | POA: Diagnosis not present

## 2020-08-24 DIAGNOSIS — R0602 Shortness of breath: Secondary | ICD-10-CM | POA: Diagnosis not present

## 2020-08-25 ENCOUNTER — Encounter: Payer: Self-pay | Admitting: *Deleted

## 2020-08-25 ENCOUNTER — Other Ambulatory Visit: Payer: Self-pay

## 2020-08-25 DIAGNOSIS — Z853 Personal history of malignant neoplasm of breast: Secondary | ICD-10-CM

## 2020-08-25 NOTE — Progress Notes (Signed)
Daily Session Note  Patient Details  Name: Melissa Merritt MRN: [id number] Date of Birth: [birth year] Referring Provider:   April Manson Cancer Associated Rehabilitation & Exercise from 06/28/2020 in [facility] Cardiac and Pulmonary Rehab  Referring Provider [name], NP      Encounter Date: 08/25/2020  Check In:  Session Check In - 08/25/20 1426      Check-In   Supervising physician immediately available to respond to emergencies See telemetry face sheet for immediately available ER MD    Location [facility]-Cardiac & Pulmonary Rehab    Staff Present Nada Maclachlan, BA, ACSM CEP, Exercise Physiologist;Godwin Tedesco Eliezer Bottom, MS Exercise Physiologist    Virtual Visit No    Medication changes reported     No    Fall or balance concerns reported    No    Warm-up and Cool-down Performed on first and last piece of equipment    Resistance Training Performed Yes    VAD Patient? No              Social History   Tobacco Use  Smoking Status Former Smoker  . Quit date: 07/09/1979  . Years since quitting: 41.1  Smokeless Tobacco Never Used    Goals Met:  Independence with exercise equipment Exercise tolerated well No report of cardiac concerns or symptoms Strength training completed today  Goals Unmet:  Not Applicable  Comments: Pt able to follow exercise prescription today without complaint.  Will continue to monitor for progression.   Dr. Emily Filbert is Medical Director for [place] and [facility].

## 2020-08-30 ENCOUNTER — Other Ambulatory Visit: Payer: Self-pay

## 2020-08-30 DIAGNOSIS — Z853 Personal history of malignant neoplasm of breast: Secondary | ICD-10-CM

## 2020-08-30 NOTE — Progress Notes (Signed)
Daily Session Note  Patient Details  Name: Melissa Merritt MRN: [id number] Date of Birth: March 07, 1951 Referring Provider:   April Manson Cancer Associated Rehabilitation & Exercise from 06/28/2020 in [facility] Cardiac and Pulmonary Rehab  Referring Provider [name], NP      Encounter Date: 08/30/2020  Check In:  Session Check In - 08/30/20 1359      Check-In   Supervising physician immediately available to respond to emergencies See telemetry face sheet for immediately available ER MD    Location [facility]-Cardiac & Pulmonary Rehab    Staff Present Coralie Keens, MS, ASCM CEP, Exercise Physiologist;Jessica Luan Pulling, MA, RCEP, CCRP, CCET    Virtual Visit No    Medication changes reported     No    Fall or balance concerns reported    No    Warm-up and Cool-down Performed on first and last piece of equipment    Resistance Training Performed Yes    VAD Patient? No    PAD/SET Patient? No              Social History   Tobacco Use  Smoking Status Former Smoker  . Quit date: 07/09/1979  . Years since quitting: 41.1  Smokeless Tobacco Never Used    Goals Met:  Independence with exercise equipment Exercise tolerated well No report of cardiac concerns or symptoms Strength training completed today  Goals Unmet:  Not Applicable  Comments: Pt able to follow exercise prescription today without complaint.  Will continue to monitor for progression.   Dr. Emily Filbert is Medical Director for [place].  Dr. Ottie Glazier is Medical Director for [facility] Pulmonary Rehabilitation.

## 2020-09-01 ENCOUNTER — Other Ambulatory Visit: Payer: Self-pay

## 2020-09-01 DIAGNOSIS — Z853 Personal history of malignant neoplasm of breast: Secondary | ICD-10-CM

## 2020-09-01 NOTE — Progress Notes (Signed)
CARE Daily Session Note  Patient Details  Name: Melissa Merritt MRN: [id number] Date of Birth: 08/27/1950 Referring Provider:   Flowsheet Row Cancer Associated Rehabilitation & Exercise from 06/28/2020 in [facility] Cardiac and Pulmonary Rehab  Referring Provider Lauren Allen, NP      Encounter Date: 09/01/2020  Check In:  Session Check In - 09/01/20 1242      Check-In   Supervising physician immediately available to respond to emergencies See telemetry face sheet for immediately available ER MD    Location [facility]-Cardiac & Pulmonary Rehab    Staff Present Kara Langdon, MS, ASCM CEP, Exercise Physiologist;Amanda Sommer, BA, ACSM CEP, Exercise Physiologist    Virtual Visit No    Medication changes reported     No    Fall or balance concerns reported    No    Warm-up and Cool-down Performed on first and last piece of equipment    Resistance Training Performed No   Yoga   PAD/SET Patient? No              Social History   Tobacco Use  Smoking Status Former Smoker  . Quit date: 07/09/1979  . Years since quitting: 41.1  Smokeless Tobacco Never Used    Goals Met:  Exercise tolerated well Queuing for purse lip breathing No report of cardiac concerns or symptoms  Goals Unmet:  Not Applicable  Comments: Pt able to follow exercise prescription today without complaint.  Will continue to monitor for progression.  Yoga session completed by Amanda Sommer, Exercise Physiologist, CEP.   Dr. Mark Miller is Medical Director for [facility].  Dr. Fuad Aleskerov is Medical Director for [facility]. 

## 2020-09-02 DIAGNOSIS — Z79899 Other long term (current) drug therapy: Secondary | ICD-10-CM | POA: Diagnosis not present

## 2020-09-02 DIAGNOSIS — R0602 Shortness of breath: Secondary | ICD-10-CM | POA: Diagnosis not present

## 2020-09-02 DIAGNOSIS — G4733 Obstructive sleep apnea (adult) (pediatric): Secondary | ICD-10-CM | POA: Diagnosis not present

## 2020-09-02 DIAGNOSIS — I10 Essential (primary) hypertension: Secondary | ICD-10-CM | POA: Diagnosis not present

## 2020-09-02 DIAGNOSIS — I519 Heart disease, unspecified: Secondary | ICD-10-CM | POA: Diagnosis not present

## 2020-09-02 DIAGNOSIS — R600 Localized edema: Secondary | ICD-10-CM | POA: Diagnosis not present

## 2020-09-06 ENCOUNTER — Encounter: Payer: Medicare Other | Admitting: *Deleted

## 2020-09-06 ENCOUNTER — Other Ambulatory Visit: Payer: Self-pay

## 2020-09-06 DIAGNOSIS — Z853 Personal history of malignant neoplasm of breast: Secondary | ICD-10-CM

## 2020-09-06 NOTE — Progress Notes (Signed)
Daily Session Note  Patient Details  Name: Melissa Merritt MRN: [id number] Date of Birth: 02-Dec-1950 Referring Provider:   April Manson Cancer Associated Rehabilitation & Exercise from 06/28/2020 in [facility] Cardiac and Pulmonary Rehab  Referring Provider [name], NP      Encounter Date: 09/06/2020  Check In:  Session Check In - 09/06/20 1233      Check-In   Supervising physician immediately available to respond to emergencies See telemetry face sheet for immediately available ER MD    Location [facility]-Cardiac & Pulmonary Rehab    Staff Present Coralie Keens, MS, ASCM CEP, Exercise Physiologist;Mallissa Lorenzen Luan Pulling, MA, RCEP, CCRP, CCET    Virtual Visit No    Medication changes reported     No    Fall or balance concerns reported    No    Warm-up and Cool-down Performed on first and last piece of equipment    Resistance Training Performed Yes    VAD Patient? No    PAD/SET Patient? No      Pain Assessment   Currently in Pain? No/denies              Social History   Tobacco Use  Smoking Status Former Smoker  . Quit date: 07/09/1979  . Years since quitting: 41.1  Smokeless Tobacco Never Used    Goals Met:  Proper associated with RPD/PD & O2 Sat Independence with exercise equipment Exercise tolerated well No report of cardiac concerns or symptoms Strength training completed today  Goals Unmet:  Not Applicable  Comments: Pt able to follow exercise prescription today without complaint.  Will continue to monitor for progression.    Dr. Emily Filbert is Medical Director for [place].  Dr. Ottie Glazier is Medical Director for [facility] Pulmonary Rehabilitation.

## 2020-09-08 ENCOUNTER — Other Ambulatory Visit: Payer: Self-pay

## 2020-09-08 ENCOUNTER — Encounter: Payer: Medicare Other | Attending: Nurse Practitioner

## 2020-09-08 DIAGNOSIS — Z853 Personal history of malignant neoplasm of breast: Secondary | ICD-10-CM

## 2020-09-08 NOTE — Progress Notes (Signed)
CARE Daily Session Note  Patient Details  Name: Melissa Merritt MRN: [id number] Date of Birth: [birth year] Referring Provider:   April Manson Cancer Associated Rehabilitation & Exercise from 06/28/2020 in [facility] Cardiac and Pulmonary Rehab  Referring Provider [name], NP      Encounter Date: 09/08/2020  Check In:  Session Check In - 09/08/20 1358      Check-In   Supervising physician immediately available to respond to emergencies See telemetry face sheet for immediately available ER MD    Location [facility]-Cardiac & Pulmonary Rehab    Staff Present Coralie Keens, MS, ASCM CEP, Exercise Physiologist;Amanda Oletta Darter, BA, ACSM CEP, Exercise Physiologist    Virtual Visit No    Medication changes reported     No    Fall or balance concerns reported    No    Warm-up and Cool-down Performed on first and last piece of equipment    Resistance Training Performed Yes    VAD Patient? No              Social History   Tobacco Use  Smoking Status Former Smoker  . Quit date: 07/09/1979  . Years since quitting: 41.1  Smokeless Tobacco Never Used    Goals Met:  Independence with exercise equipment Exercise tolerated well No report of cardiac concerns or symptoms Strength training completed today  Goals Unmet:  Not Applicable  Comments: Pt able to follow exercise prescription today without complaint.  Will continue to monitor for progression.   Dr. Emily Filbert is Medical Director for [place].  Dr. Ottie Glazier is Medical Director for [facility] Pulmonary Rehabilitation.

## 2020-09-13 ENCOUNTER — Other Ambulatory Visit: Payer: Self-pay

## 2020-09-13 DIAGNOSIS — Z853 Personal history of malignant neoplasm of breast: Secondary | ICD-10-CM

## 2020-09-13 NOTE — Progress Notes (Signed)
Daily Session Note  Patient Details  Name: Melissa Merritt MRN: [id number] Date of Birth: [birth year] Referring Provider:   April Merritt Cancer Associated Rehabilitation & Exercise from 06/28/2020 in [facility] Cardiac and Pulmonary Rehab  Referring Provider Melissa Rutter, NP      Encounter Date: 09/13/2020  Check In:  Session Check In - 09/13/20 1235      Check-In   Supervising physician immediately available to respond to emergencies See telemetry face sheet for immediately available ER MD    Location [facility]-Cardiac & Pulmonary Rehab    Staff Present Birdie Sons, MPA, Nino Glow, MS, ASCM CEP, Exercise Physiologist;Jessica Luan Pulling, MA, RCEP, CCRP, CCET    Virtual Visit No    Medication changes reported     No    Fall or balance concerns reported    No    Tobacco Cessation No Change    Warm-up and Cool-down Performed on first and last piece of equipment    Resistance Training Performed Yes    VAD Patient? No    PAD/SET Patient? No      Pain Assessment   Currently in Pain? No/denies              Social History   Tobacco Use  Smoking Status Former Smoker  . Quit date: 07/09/1979  . Years since quitting: 41.2  Smokeless Tobacco Never Used    Goals Met:  Independence with exercise equipment Exercise tolerated well No report of cardiac concerns or symptoms Strength training completed today  Goals Unmet:  Not Applicable  Comments: Pt able to follow exercise prescription today without complaint.  Will continue to monitor for progression.    Dr. Emily Filbert is Medical Director for [place].  Dr. Ottie Glazier is Medical Director for [facility] Pulmonary Rehabilitation.

## 2020-09-15 ENCOUNTER — Other Ambulatory Visit: Payer: Self-pay

## 2020-09-15 DIAGNOSIS — Z853 Personal history of malignant neoplasm of breast: Secondary | ICD-10-CM

## 2020-09-15 NOTE — Progress Notes (Signed)
Daily Session Note  Patient Details  Name: Melissa Merritt MRN: [id number] Date of Birth: [birth year] Referring Provider:   April Manson Cancer Associated Rehabilitation & Exercise from 06/28/2020 in [facility] Cardiac and Pulmonary Rehab  Referring Provider [name], NP       Encounter Date: 09/15/2020  Check In:  Session Check In - 09/15/20 1314       Check-In   Supervising physician immediately available to respond to emergencies See telemetry face sheet for immediately available ER MD    Location [facility]-Cardiac & Pulmonary Rehab    Staff Present Birdie Sons, MPA, RN;Melissa Caiola RDN, Rowe Pavy, BA, ACSM CEP, Exercise Physiologist    Virtual Visit No    Medication changes reported     No    Fall or balance concerns reported    No    Tobacco Cessation No Change    Warm-up and Cool-down Performed on first and last piece of equipment    Resistance Training Performed Yes    VAD Patient? No    PAD/SET Patient? No      Pain Assessment   Currently in Pain? No/denies                Social History   Tobacco Use  Smoking Status Former   Pack years: 0.00   Types: Cigarettes   Quit date: 07/09/1979   Years since quitting: 41.2  Smokeless Tobacco Never    Goals Met:  Independence with exercise equipment Exercise tolerated well No report of cardiac concerns or symptoms Strength training completed today  Goals Unmet:  Not Applicable  Comments: Pt able to follow exercise prescription today without complaint.  Will continue to monitor for progression.    Dr. Emily Filbert is Medical Director for [place].  Dr. Ottie Glazier is Medical Director for [facility] Pulmonary Rehabilitation.

## 2020-09-20 ENCOUNTER — Other Ambulatory Visit: Payer: Self-pay

## 2020-09-20 ENCOUNTER — Encounter: Payer: Self-pay | Admitting: Hematology and Oncology

## 2020-09-20 DIAGNOSIS — Z853 Personal history of malignant neoplasm of breast: Secondary | ICD-10-CM

## 2020-09-20 NOTE — Progress Notes (Signed)
CARE Daily Session Note  Patient Details  Name: SHEQUILA NEGLIA MRN: [id number] Date of Birth: [birth year] Referring Provider:   April Manson Cancer Associated Rehabilitation & Exercise from 06/28/2020 in [facility] Cardiac and Pulmonary Rehab  Referring Provider [name], NP       Encounter Date: 09/20/2020  Check In:  Session Check In - 09/20/20 1445       Check-In   Supervising physician immediately available to respond to emergencies See telemetry face sheet for immediately available ER MD    Location [facility]-Cardiac & Pulmonary Rehab    Staff Present Coralie Keens, MS, ASCM CEP, Exercise Physiologist;Melissa Caiola RDN, Luther Redo, MPA, RN    Virtual Visit No    Medication changes reported     No    Fall or balance concerns reported    Yes    Comments Tripped- no injuries    Tobacco Cessation No Change    Warm-up and Cool-down Performed on first and last piece of equipment    Resistance Training Performed Yes    VAD Patient? No                Social History   Tobacco Use  Smoking Status Former   Pack years: 0.00   Types: Cigarettes   Quit date: 07/09/1979   Years since quitting: 41.2  Smokeless Tobacco Never    Goals Met:  Independence with exercise equipment Exercise tolerated well No report of cardiac concerns or symptoms Strength training completed today  Goals Unmet:  Not Applicable  Comments: Pt able to follow exercise prescription today without complaint.  Will continue to monitor for progression.    Dr. Emily Filbert is Medical Director for [place].  Dr. Ottie Glazier is Medical Director for [facility] Pulmonary Rehabilitation.

## 2020-09-22 ENCOUNTER — Other Ambulatory Visit: Payer: Self-pay

## 2020-09-22 DIAGNOSIS — Z853 Personal history of malignant neoplasm of breast: Secondary | ICD-10-CM

## 2020-09-22 NOTE — Progress Notes (Signed)
Daily Session Note  Patient Details  Name: Melissa Merritt MRN: [id number] Date of Birth: 1950-11-13 Referring Provider:   April Manson Cancer Associated Rehabilitation & Exercise from 06/28/2020 in [facility] Cardiac and Pulmonary Rehab  Referring Provider [name], NP       Encounter Date: 09/22/2020  Check In:  Session Check In - 09/22/20 1335       Check-In   Supervising physician immediately available to respond to emergencies See telemetry face sheet for immediately available ER MD    Location [facility]-Cardiac & Pulmonary Rehab    Staff Present Nada Maclachlan, BA, ACSM CEP, Exercise Physiologist;Kara Eliezer Bottom, MS, ASCM CEP, Exercise Physiologist    Virtual Visit No    Medication changes reported     No    Fall or balance concerns reported    No    Warm-up and Cool-down Performed on first and last piece of equipment    Resistance Training Performed Yes    VAD Patient? No    PAD/SET Patient? No      Pain Assessment   Currently in Pain? No/denies                Social History   Tobacco Use  Smoking Status Former   Pack years: 0.00   Types: Cigarettes   Quit date: 07/09/1979   Years since quitting: 41.2  Smokeless Tobacco Never    Goals Met:  Independence with exercise equipment Exercise tolerated well No report of cardiac concerns or symptoms Strength training completed today  Goals Unmet:  Not Applicable  Comments: Pt able to follow exercise prescription today without complaint.  Will continue to monitor for progression.    Dr. Emily Filbert is Medical Director for [place].  Dr. Ottie Glazier is Medical Director for [facility] Pulmonary Rehabilitation.

## 2020-09-27 ENCOUNTER — Other Ambulatory Visit: Payer: Self-pay

## 2020-09-27 ENCOUNTER — Encounter: Payer: Medicare Other | Admitting: *Deleted

## 2020-09-27 DIAGNOSIS — Z853 Personal history of malignant neoplasm of breast: Secondary | ICD-10-CM

## 2020-09-27 NOTE — Progress Notes (Signed)
Daily Session Note  Patient Details  Name: Melissa Merritt MRN: [id number] Date of Birth: [birth year] Referring Provider:   April Manson Cancer Associated Rehabilitation & Exercise from 06/28/2020 in [facility] Cardiac and Pulmonary Rehab  Referring Provider [name], NP       Encounter Date: 09/27/2020  Check In:  Session Check In - 09/27/20 1238       Check-In   Supervising physician immediately available to respond to emergencies See telemetry face sheet for immediately available ER MD    Location [facility]-Cardiac & Pulmonary Rehab    Staff Present Alberteen Sam, MA, RCEP, CCRP, Marylynn Pearson, MS, ASCM CEP, Exercise Physiologist    Virtual Visit No    Medication changes reported     No    Fall or balance concerns reported    No    Warm-up and Cool-down Performed on first and last piece of equipment    Resistance Training Performed Yes    VAD Patient? No    PAD/SET Patient? No      Pain Assessment   Currently in Pain? No/denies                Social History   Tobacco Use  Smoking Status Former   Pack years: 0.00   Types: Cigarettes   Quit date: 07/09/1979   Years since quitting: 41.2  Smokeless Tobacco Never    Goals Met:  Proper associated with RPD/PD & O2 Sat Independence with exercise equipment Exercise tolerated well No report of cardiac concerns or symptoms Strength training completed today  Goals Unmet:  Not Applicable  Comments: Pt able to follow exercise prescription today without complaint.  Will continue to monitor for progression.    Dr. Emily Filbert is Medical Director for [place].  Dr. Ottie Glazier is Medical Director for [facility] Pulmonary Rehabilitation.

## 2020-10-03 DIAGNOSIS — H02839 Dermatochalasis of unspecified eye, unspecified eyelid: Secondary | ICD-10-CM | POA: Diagnosis not present

## 2020-10-03 DIAGNOSIS — H524 Presbyopia: Secondary | ICD-10-CM | POA: Diagnosis not present

## 2020-10-03 DIAGNOSIS — H43813 Vitreous degeneration, bilateral: Secondary | ICD-10-CM | POA: Diagnosis not present

## 2020-10-03 DIAGNOSIS — H47323 Drusen of optic disc, bilateral: Secondary | ICD-10-CM | POA: Diagnosis not present

## 2020-10-03 DIAGNOSIS — H5213 Myopia, bilateral: Secondary | ICD-10-CM | POA: Diagnosis not present

## 2020-10-03 DIAGNOSIS — H52223 Regular astigmatism, bilateral: Secondary | ICD-10-CM | POA: Diagnosis not present

## 2020-10-03 DIAGNOSIS — Z961 Presence of intraocular lens: Secondary | ICD-10-CM | POA: Diagnosis not present

## 2020-10-04 ENCOUNTER — Other Ambulatory Visit: Payer: Self-pay

## 2020-10-04 DIAGNOSIS — Z853 Personal history of malignant neoplasm of breast: Secondary | ICD-10-CM

## 2020-10-04 NOTE — Progress Notes (Signed)
Daily Session Note  Patient Details  Name: ISATU MACINNES MRN: [id number] Date of Birth: 06/11/1950 Referring Provider:   April Manson Cancer Associated Rehabilitation & Exercise from 06/28/2020 in [facility] Cardiac and Pulmonary Rehab  Referring Provider [name], NP       Encounter Date: 10/04/2020  Check In:  Session Check In - 10/04/20 1235       Check-In   Supervising physician immediately available to respond to emergencies See telemetry face sheet for immediately available ER MD    Location [facility]-Cardiac & Pulmonary Rehab    Staff Present Birdie Sons, MPA, Nino Glow, MS, ASCM CEP, Exercise Physiologist;Jessica Luan Pulling, MA, RCEP, CCRP, CCET    Virtual Visit No    Medication changes reported     No    Fall or balance concerns reported    No    Tobacco Cessation No Change    Warm-up and Cool-down Performed on first and last piece of equipment    Resistance Training Performed Yes    VAD Patient? No    PAD/SET Patient? No      Pain Assessment   Currently in Pain? No/denies                Social History   Tobacco Use  Smoking Status Former   Pack years: 0.00   Types: Cigarettes   Quit date: 07/09/1979   Years since quitting: 41.2  Smokeless Tobacco Never    Goals Met:  Independence with exercise equipment Exercise tolerated well No report of cardiac concerns or symptoms Strength training completed today  Goals Unmet:  Not Applicable  Comments: Pt able to follow exercise prescription today without complaint.  Will continue to monitor for progression.    Dr. Emily Filbert is Medical Director for [place].  Dr. Ottie Glazier is Medical Director for [facility] Pulmonary Rehabilitation.

## 2020-10-05 DIAGNOSIS — J449 Chronic obstructive pulmonary disease, unspecified: Secondary | ICD-10-CM | POA: Diagnosis not present

## 2020-10-06 ENCOUNTER — Other Ambulatory Visit: Payer: Self-pay

## 2020-10-06 DIAGNOSIS — Z853 Personal history of malignant neoplasm of breast: Secondary | ICD-10-CM

## 2020-10-06 NOTE — Progress Notes (Signed)
Daily Session Note  Patient Details  Name: Melissa Merritt MRN: [id number] Date of Birth: Nov 07, 1950 Referring Provider:   April Manson Cancer Associated Rehabilitation & Exercise from 06/28/2020 in [facility] Cardiac and Pulmonary Rehab  Referring Provider [name], NP       Encounter Date: 10/06/2020  Check In:  Session Check In - 10/06/20 1246       Check-In   Supervising physician immediately available to respond to emergencies See telemetry face sheet for immediately available ER MD    Location [facility]-Cardiac & Pulmonary Rehab    Staff Present Birdie Sons, MPA, Nino Glow, MS, ASCM CEP, Exercise Physiologist;Amanda Oletta Darter, BA, ACSM CEP, Exercise Physiologist    Virtual Visit No    Medication changes reported     Yes    Comments added trilogy inhaler starting today    Fall or balance concerns reported    No    Tobacco Cessation No Change    Warm-up and Cool-down Performed on first and last piece of equipment    Resistance Training Performed Yes    VAD Patient? No    PAD/SET Patient? No      Pain Assessment   Currently in Pain? No/denies                Social History   Tobacco Use  Smoking Status Former   Pack years: 0.00   Types: Cigarettes   Quit date: 07/09/1979   Years since quitting: 41.2  Smokeless Tobacco Never    Goals Met:  Independence with exercise equipment Exercise tolerated well No report of cardiac concerns or symptoms Strength training completed today  Goals Unmet:  Not Applicable  Comments: Pt able to follow exercise prescription today without complaint.  Will continue to monitor for progression.    Dr. Emily Filbert is Medical Director for [place].  Dr. Ottie Glazier is Medical Director for [facility] Pulmonary Rehabilitation.

## 2020-10-13 IMAGING — MG MM PLC BREAST LOC DEV 1ST LESION INC*R*
8 of 14 series · 8 of 30 positions shown · non-contrast
Comparison: Previous exam(s)

CLINICAL DATA: 68-year-old female for radiofrequency device
localization of RIGHT breast cancer prior to RIGHT lumpectomy.

EXAM:
MAMMOGRAPHIC GUIDED RADIOFREQUENCY DEVICE LOCALIZATION OF THE
RIGHTBREAST

[R CC (1 of 2)]
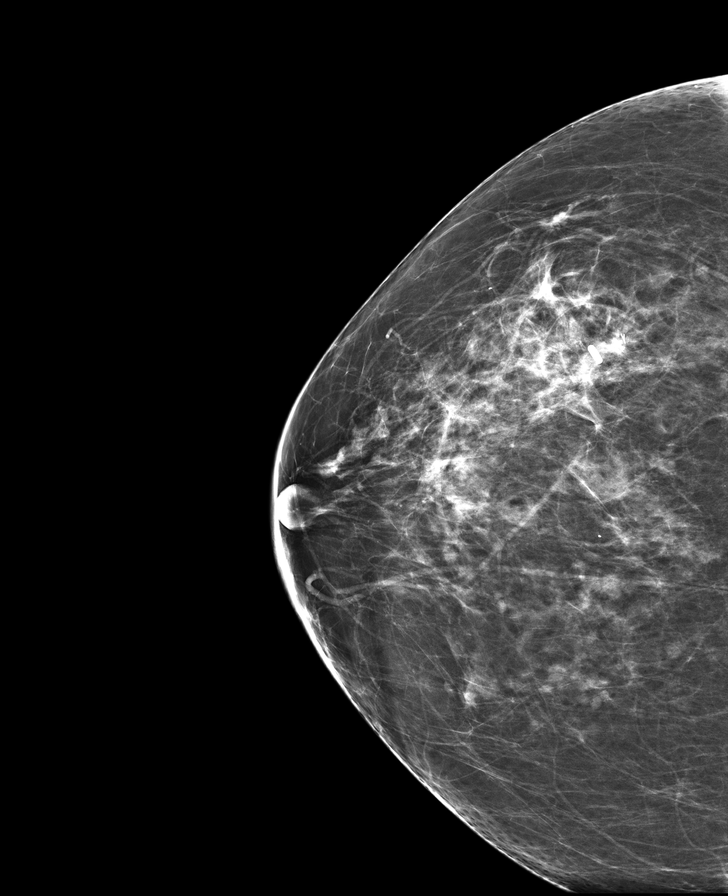

[R LM (1 of 4)]
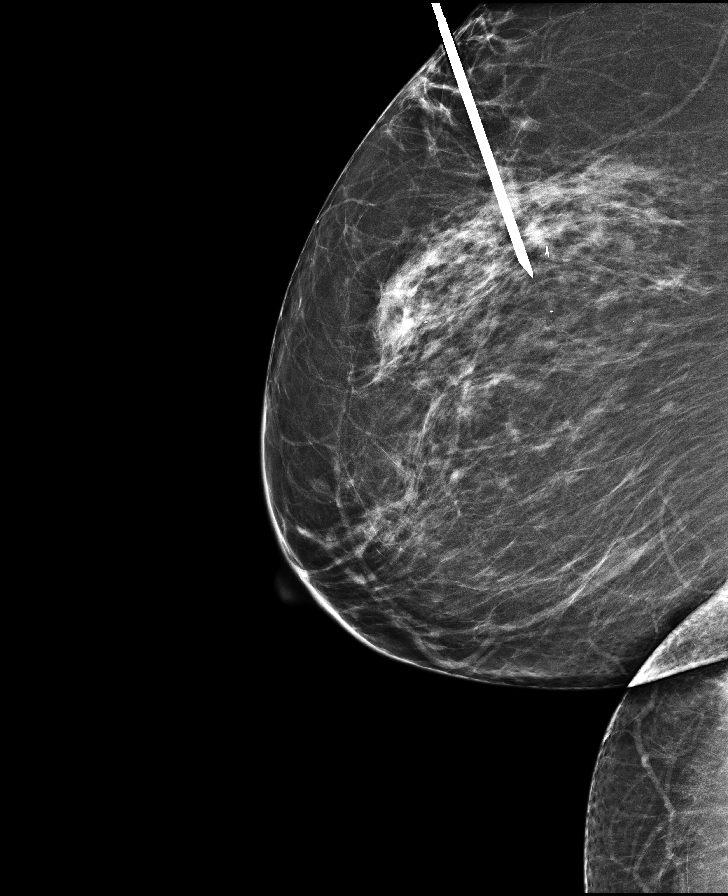

[R CC (2 of 2)]
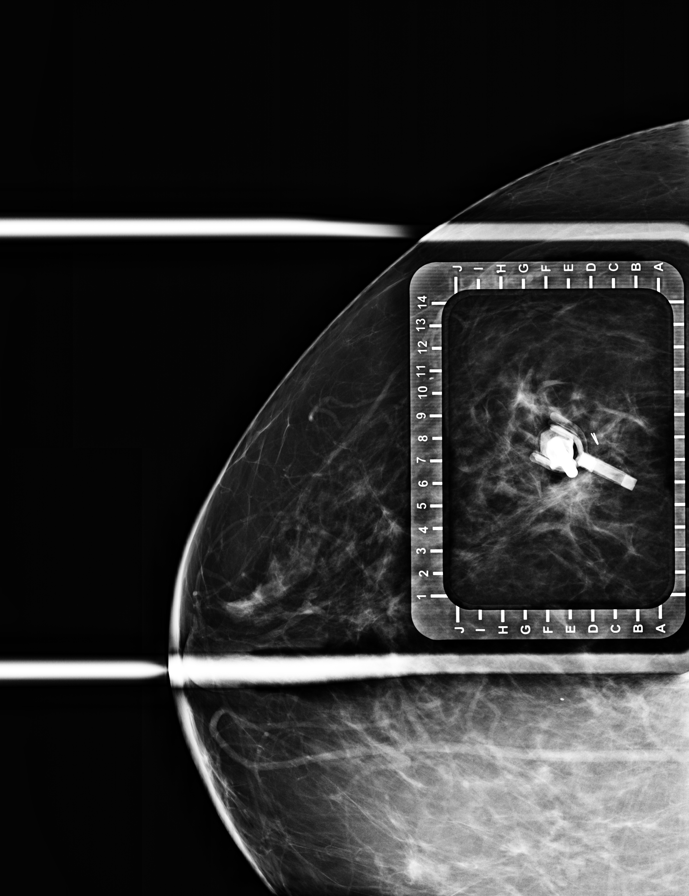

[R LM (2 of 4)]
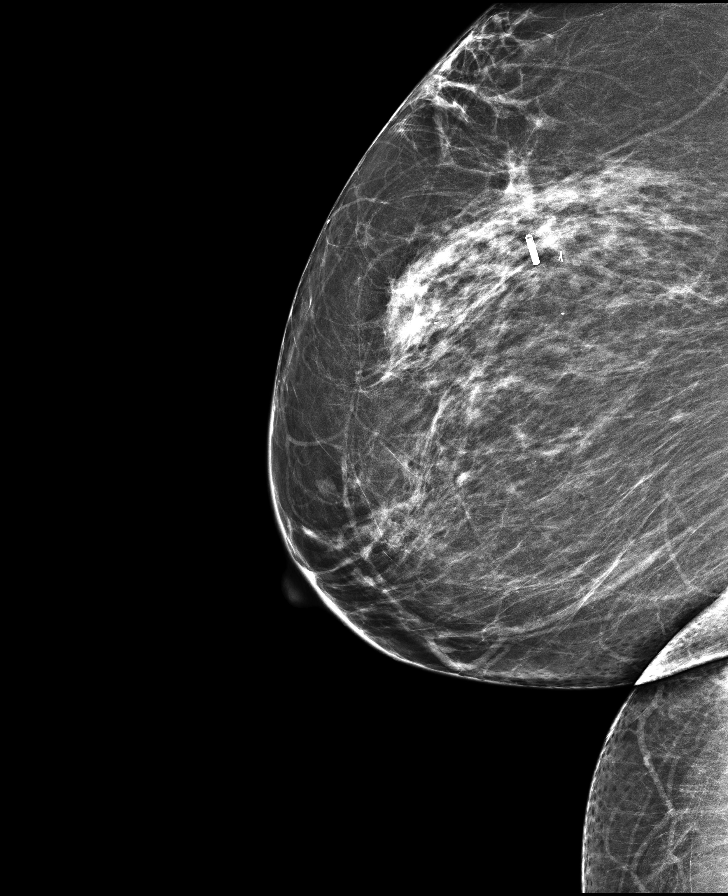

[R LM (3 of 4)]
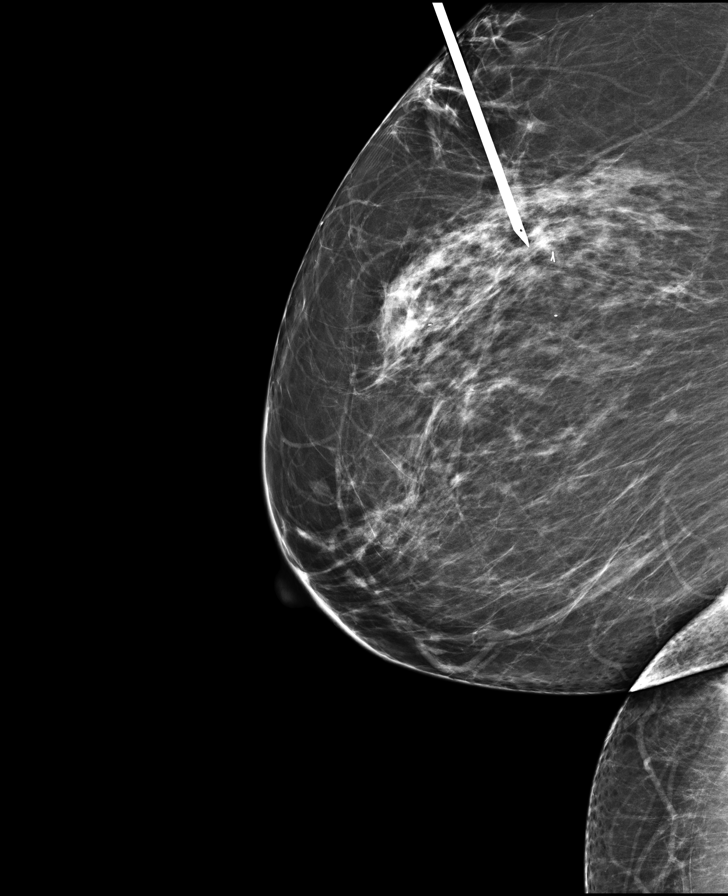

[R LM (4 of 4)]
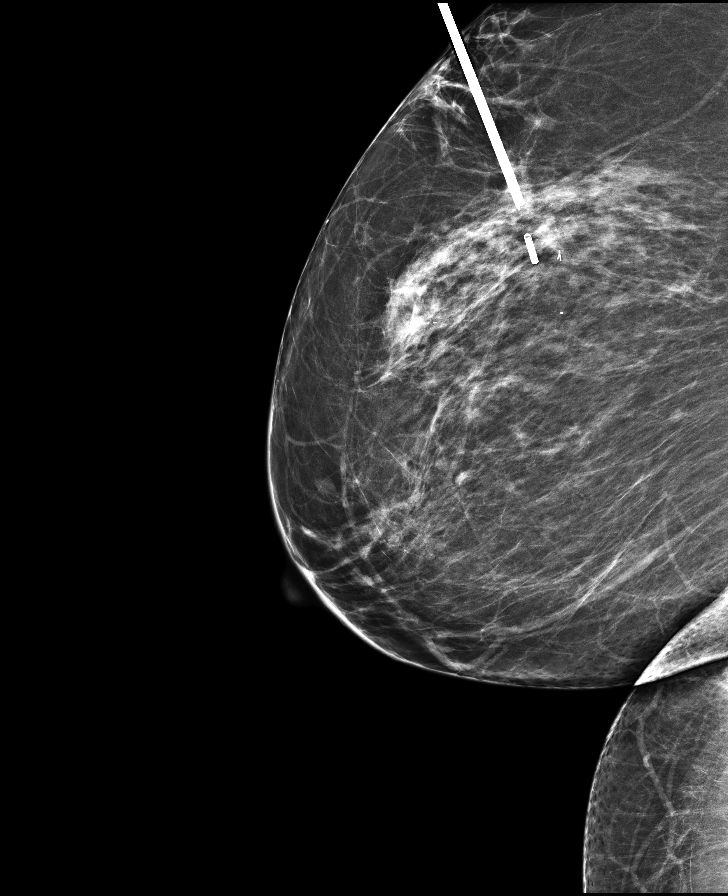

[R CC synth-2D]
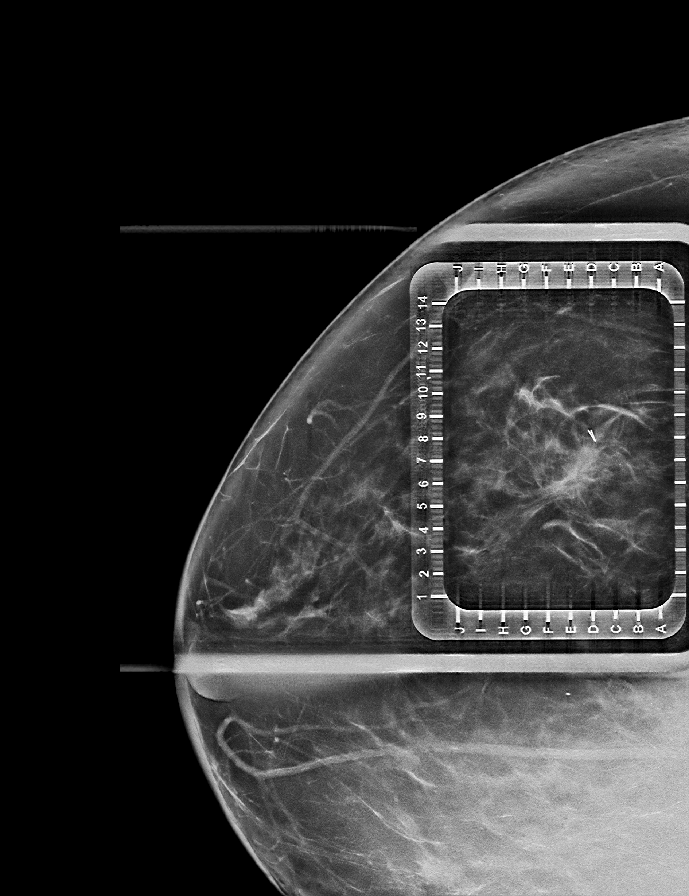

[R LM synth-2D]
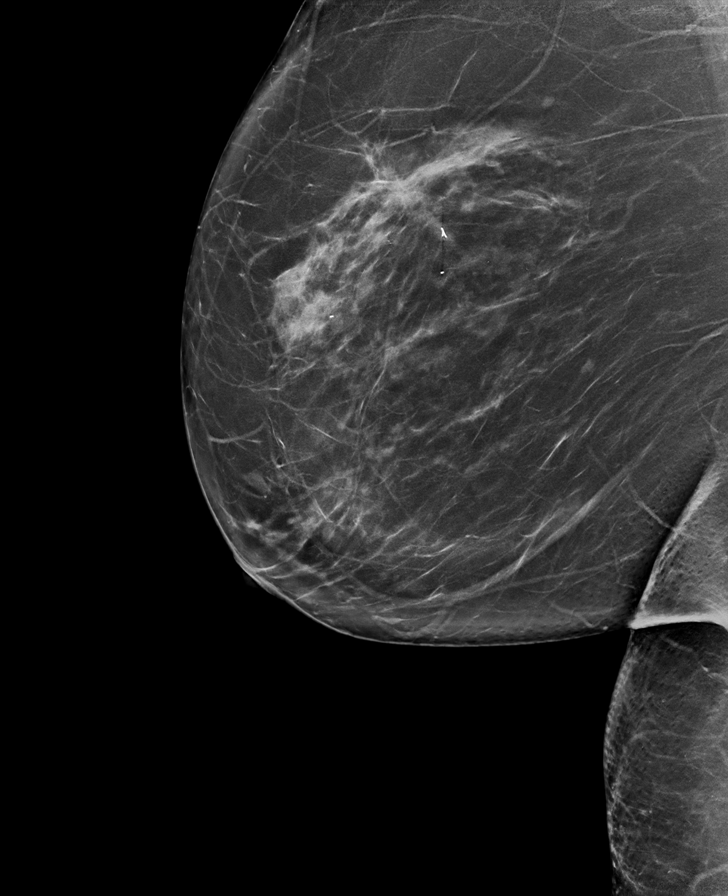

[8 of 30 positions shown; findings below may reference images not displayed]

FINDINGS: Patient presents for radiofrequency device localization prior to
RIGHT lumpectomy. I met with the patient and we discussed the
procedure of radiofrequency device localization including benefits
and alternatives. We discussed the high likelihood of a successful
procedure. We discussed the risks of the procedure including
infection, bleeding, tissue injury and further surgery. Informed,
written consent was given.

The usual time-out protocol was performed immediately prior to the
procedure.

Using mammographic guidance, sterile technique, 1% lidocaine as
local anesthesia, a radiofrequency tag was used to localize the area
of biopsied distortion using a SUPERIOR approach.

The follow-up mammogram images confirm that the RF device is in the
expected location and are marked for Dr. Seb. The RF tag is
located 1 cm anterior to the RIBBON clip at the expected location of
the biopsied distortion.

Follow-up survey of the patient confirms the presence of the RF
device.

The patient tolerated the procedure well and was released from the
[REDACTED].
IMPRESSION: Radiofrequency device localization of the RIGHT breast. No apparent
complications.

## 2020-10-18 ENCOUNTER — Other Ambulatory Visit: Payer: Self-pay

## 2020-10-18 ENCOUNTER — Encounter: Payer: Medicare Other | Attending: Nurse Practitioner

## 2020-10-18 DIAGNOSIS — Z853 Personal history of malignant neoplasm of breast: Secondary | ICD-10-CM | POA: Insufficient documentation

## 2020-10-18 NOTE — Progress Notes (Signed)
Daily Session Note  Patient Details  Name: SHAHLA BETSILL MRN: [id number] Date of Birth: 09-Jun-1950 Referring Provider:   April Manson Cancer Associated Rehabilitation & Exercise from 06/28/2020 in [facility] Cardiac and Pulmonary Rehab  Referring Provider [name], NP       Encounter Date: 10/18/2020  Check In:  Session Check In - 10/18/20 [place]       Check-In   Supervising physician immediately available to respond to emergencies See telemetry face sheet for immediately available ER MD    Location [facility]-Cardiac & Pulmonary Rehab    Staff Present Alberteen Sam, MA, RCEP, CCRP, Marylynn Pearson, MS, ASCM CEP, Exercise Physiologist;Sarinity Dicicco Rosalia Hammers, MPA, RN    Virtual Visit No    Medication changes reported     No    Fall or balance concerns reported    No    Tobacco Cessation No Change    Warm-up and Cool-down Performed on first and last piece of equipment    Resistance Training Performed Yes    VAD Patient? No    PAD/SET Patient? No      Pain Assessment   Currently in Pain? No/denies                Social History   Tobacco Use  Smoking Status Former   Pack years: 0.00   Types: Cigarettes   Quit date: 07/09/1979   Years since quitting: 41.3  Smokeless Tobacco Never    Goals Met:  Independence with exercise equipment Exercise tolerated well No report of cardiac concerns or symptoms Strength training completed today  Goals Unmet:  Not Applicable  Comments: Pt able to follow exercise prescription today without complaint.  Will continue to monitor for progression.    Dr. Emily Filbert is Medical Director for [place].  Dr. Ottie Glazier is Medical Director for [facility] Pulmonary Rehabilitation.

## 2020-10-20 ENCOUNTER — Encounter: Payer: Medicare Other | Admitting: *Deleted

## 2020-10-20 ENCOUNTER — Other Ambulatory Visit: Payer: Self-pay

## 2020-10-20 DIAGNOSIS — Z853 Personal history of malignant neoplasm of breast: Secondary | ICD-10-CM

## 2020-10-20 IMAGING — MG MM BREAST SURGICAL SPECIMEN
2 series · 2 of 2 positions shown · non-contrast
Comparison: Previous exam(s).

CLINICAL DATA: Patient status post right breast lumpectomy.

EXAM:
SPECIMEN RADIOGRAPH OF THE RIGHT BREAST

[R (1 of 2)]
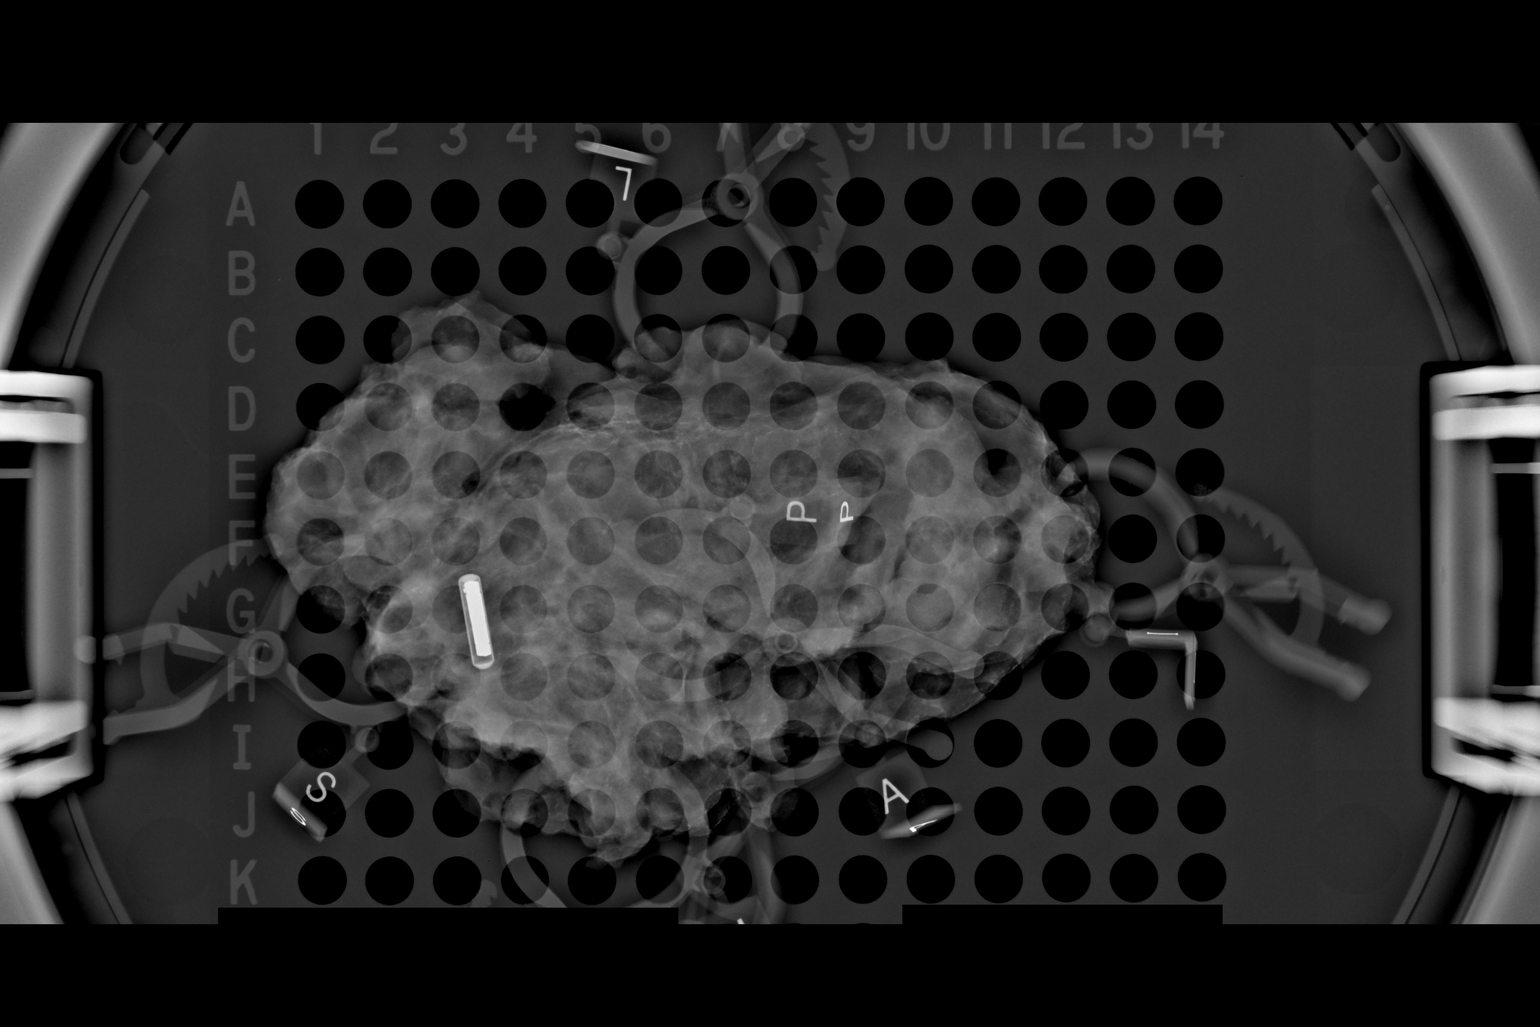

[R (2 of 2)]
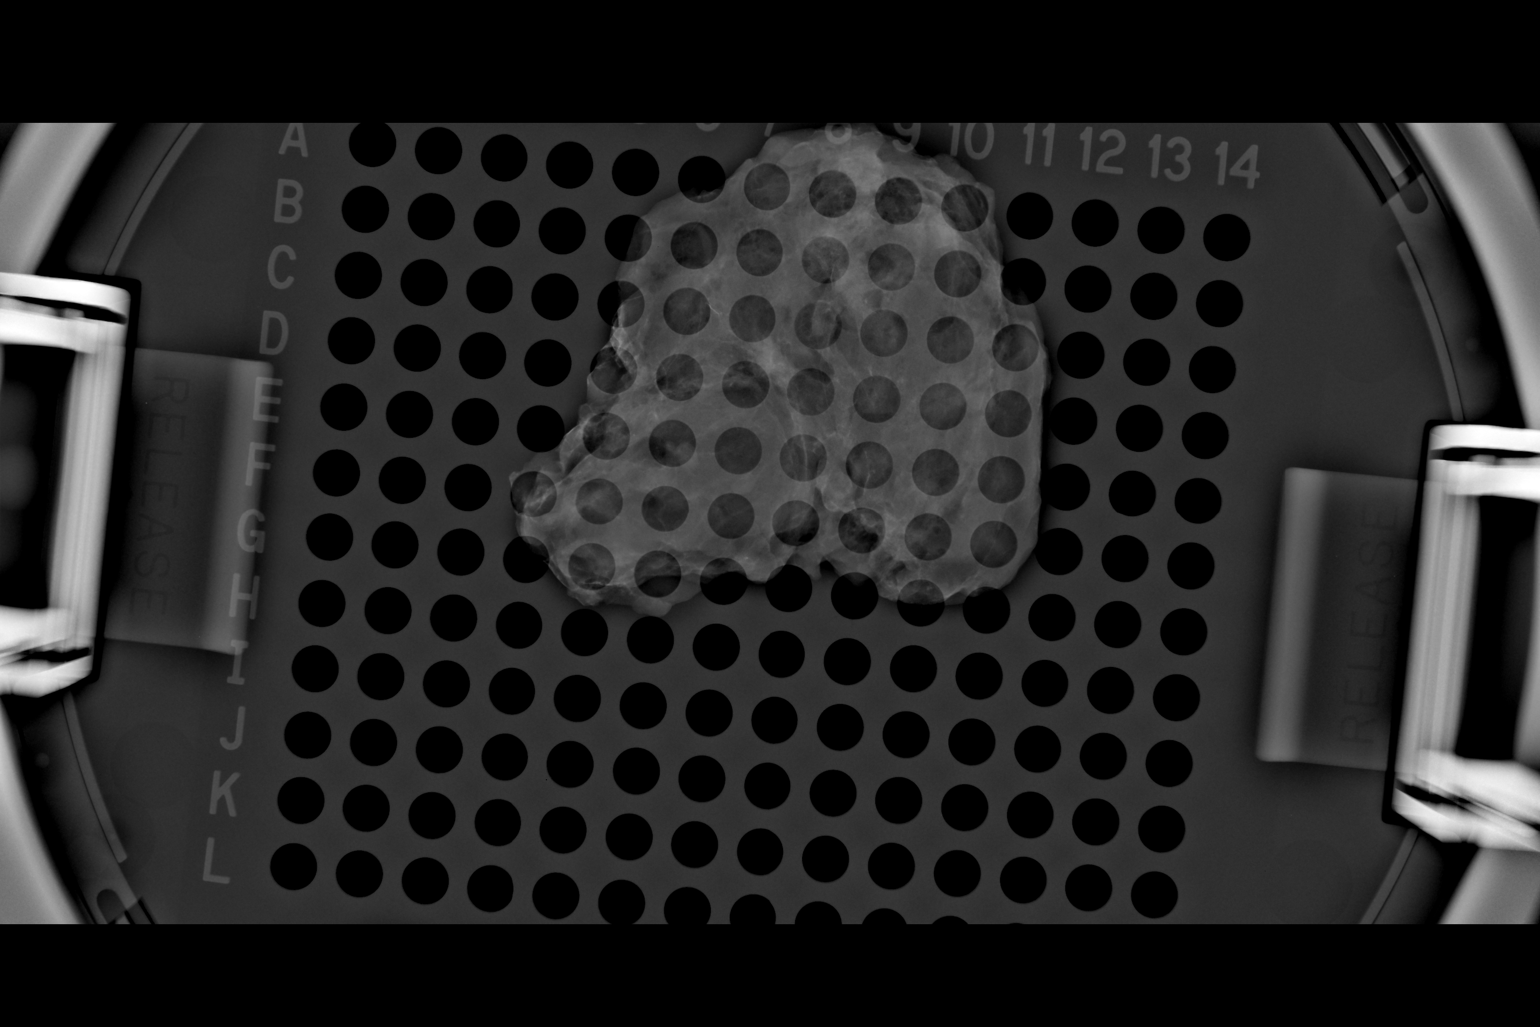

[2 of 2 positions shown; findings below may reference images not displayed]

FINDINGS: Status post excision of the right breast. The RF tag is present,
completely intact, and marked for pathology. The ribbon shaped clip
is not included in either specimen.
IMPRESSION: Specimen radiograph of the right breast demonstrates the RF tag.

The ribbon shaped marking clip is not included in either specimen.

These results were called by telephone at the time of interpretation
on 08/13/2019 to provider CARL-OSKAR NAMN while the patient was in the
operating room, who verbally acknowledged these results.

## 2020-10-20 NOTE — Progress Notes (Signed)
Daily Session Note  Patient Details  Name: Melissa Merritt MRN: [id number] Date of Birth: 1951-01-22 Referring Provider:   April Manson Cancer Associated Rehabilitation & Exercise from 06/28/2020 in [facility] Cardiac and Pulmonary Rehab  Referring Provider [name], NP       Encounter Date: 10/20/2020  Check In:  Session Check In - 10/20/20 1239       Check-In   Supervising physician immediately available to respond to emergencies See telemetry face sheet for immediately available ER MD    Location [facility]-Cardiac & Pulmonary Rehab    Staff Present Alberteen Sam, MA, RCEP, CCRP, Marylynn Pearson, MS, ASCM CEP, Exercise Physiologist    Virtual Visit No    Medication changes reported     No    Fall or balance concerns reported    No    Tobacco Cessation No Change    Warm-up and Cool-down Performed on first and last piece of equipment    Resistance Training Performed Yes    VAD Patient? No    PAD/SET Patient? No      Pain Assessment   Currently in Pain? No/denies                Social History   Tobacco Use  Smoking Status Former   Types: Cigarettes   Quit date: 07/09/1979   Years since quitting: 41.3  Smokeless Tobacco Never    Goals Met:  Proper associated with RPD/PD & O2 Sat Independence with exercise equipment Exercise tolerated well No report of cardiac concerns or symptoms Strength training completed today  Goals Unmet:  Not Applicable  Comments: Pt able to follow exercise prescription today without complaint.  Will continue to monitor for progression.    Dr. Emily Filbert is Medical Director for [place].  Dr. Ottie Glazier is Medical Director for [facility] Pulmonary Rehabilitation.

## 2020-10-25 ENCOUNTER — Other Ambulatory Visit: Payer: Self-pay

## 2020-10-25 ENCOUNTER — Encounter: Payer: Medicare Other | Admitting: *Deleted

## 2020-10-25 DIAGNOSIS — Z853 Personal history of malignant neoplasm of breast: Secondary | ICD-10-CM

## 2020-10-25 NOTE — Progress Notes (Signed)
Daily Session Note  Patient Details  Name: Melissa Merritt MRN: [id number] Date of Birth: 1950-09-11 Referring Provider:   April Manson Cancer Associated Rehabilitation & Exercise from 06/28/2020 in [facility] Cardiac and Pulmonary Rehab  Referring Provider [name], NP       Encounter Date: 10/25/2020  Check In:  Session Check In - 10/25/20 1241       Check-In   Supervising physician immediately available to respond to emergencies See telemetry face sheet for immediately available ER MD    Location [facility]-Cardiac & Pulmonary Rehab    Staff Present Hope Budds, RDN, LDN;Braylie Badami [place], MA, RCEP, CCRP, Marylynn Pearson, MS, ASCM CEP, Exercise Physiologist    Virtual Visit No    Medication changes reported     No    Fall or balance concerns reported    No    Warm-up and Cool-down Performed on first and last piece of equipment    Resistance Training Performed Yes    VAD Patient? No    PAD/SET Patient? No      Pain Assessment   Currently in Pain? No/denies                Social History   Tobacco Use  Smoking Status Former   Types: Cigarettes   Quit date: 07/09/1979   Years since quitting: 41.3  Smokeless Tobacco Never    Goals Met:  Proper associated with RPD/PD & O2 Sat Independence with exercise equipment Using PLB without cueing & demonstrates good technique Exercise tolerated well No report of cardiac concerns or symptoms Strength training completed today  Goals Unmet:  Not Applicable  Comments: Pt able to follow exercise prescription today without complaint.  Will continue to monitor for progression.    Dr. Emily Filbert is Medical Director for [place].  Dr. Ottie Glazier is Medical Director for [facility] Pulmonary Rehabilitation.

## 2020-10-27 ENCOUNTER — Other Ambulatory Visit: Payer: Self-pay

## 2020-10-27 DIAGNOSIS — Z853 Personal history of malignant neoplasm of breast: Secondary | ICD-10-CM

## 2020-10-27 NOTE — Progress Notes (Signed)
Daily Session Note  Patient Details  Name: JAMEYA PONTIFF MRN: [id number] Date of Birth: [birth year] Referring Provider:   April Manson Cancer Associated Rehabilitation & Exercise from 06/28/2020 in [facility] Cardiac and Pulmonary Rehab  Referring Provider [name], NP       Encounter Date: 10/27/2020  Check In:  Session Check In - 10/27/20 1220       Check-In   Supervising physician immediately available to respond to emergencies See telemetry face sheet for immediately available ER MD    Location [facility]-Cardiac & Pulmonary Rehab    Staff Present Birdie Sons, MPA, Nino Glow, MS, ASCM CEP, Exercise Physiologist;Amanda Oletta Darter, BA, ACSM CEP, Exercise Physiologist    Virtual Visit No    Medication changes reported     No    Fall or balance concerns reported    No    Tobacco Cessation No Change    Warm-up and Cool-down Performed on first and last piece of equipment    Resistance Training Performed Yes    VAD Patient? No    PAD/SET Patient? No      Pain Assessment   Currently in Pain? No/denies                Social History   Tobacco Use  Smoking Status Former   Types: Cigarettes   Quit date: 07/09/1979   Years since quitting: 41.3  Smokeless Tobacco Never    Goals Met:  Independence with exercise equipment Exercise tolerated well No report of cardiac concerns or symptoms Strength training completed today  Goals Unmet:  Not Applicable  Comments: Pt able to follow exercise prescription today without complaint.  Will continue to monitor for progression.    Dr. Emily Filbert is Medical Director for [place].  Dr. Ottie Glazier is Medical Director for [facility] Pulmonary Rehabilitation.

## 2020-11-01 ENCOUNTER — Other Ambulatory Visit: Payer: Self-pay

## 2020-11-01 DIAGNOSIS — Z853 Personal history of malignant neoplasm of breast: Secondary | ICD-10-CM

## 2020-11-01 NOTE — Progress Notes (Signed)
Daily Session Note  Patient Details  Name: Melissa Merritt MRN: [id number] Date of Birth: [birth year] Referring Provider:   April Manson Cancer Associated Rehabilitation & Exercise from 06/28/2020 in [facility] Cardiac and Pulmonary Rehab  Referring Provider [name], NP       Encounter Date: 11/01/2020  Check In:  Session Check In - 11/01/20 1223       Check-In   Supervising physician immediately available to respond to emergencies See telemetry face sheet for immediately available ER MD    Location [facility]-Cardiac & Pulmonary Rehab    Staff Present Birdie Sons, MPA, Nino Glow, MS, ASCM CEP, Exercise Physiologist;Jessica Luan Pulling, MA, RCEP, CCRP, CCET    Virtual Visit No    Medication changes reported     No    Fall or balance concerns reported    No    Tobacco Cessation No Change    Warm-up and Cool-down Performed on first and last piece of equipment    Resistance Training Performed Yes    VAD Patient? No    PAD/SET Patient? No      Pain Assessment   Currently in Pain? No/denies                Social History   Tobacco Use  Smoking Status Former   Types: Cigarettes   Quit date: 07/09/1979   Years since quitting: 41.3  Smokeless Tobacco Never    Goals Met:  Independence with exercise equipment Exercise tolerated well No report of cardiac concerns or symptoms Strength training completed today  Goals Unmet:  Not Applicable  Comments: Pt able to follow exercise prescription today without complaint.  Will continue to monitor for progression.    Dr. Emily Filbert is Medical Director for [place].  Dr. Ottie Glazier is Medical Director for [facility] Pulmonary Rehabilitation.

## 2020-11-03 ENCOUNTER — Other Ambulatory Visit: Payer: Self-pay

## 2020-11-03 DIAGNOSIS — Z853 Personal history of malignant neoplasm of breast: Secondary | ICD-10-CM

## 2020-11-03 NOTE — Progress Notes (Signed)
Daily Session Note  Patient Details  Name: Melissa Merritt MRN: [id number] Date of Birth: 26-Feb-1951 Referring Provider:   April Manson Cancer Associated Rehabilitation & Exercise from 06/28/2020 in [facility] Cardiac and Pulmonary Rehab  Referring Provider [name], NP       Encounter Date: 11/03/2020  Check In:  Session Check In - 11/03/20 1246       Check-In   Supervising physician immediately available to respond to emergencies See telemetry face sheet for immediately available ER MD    Location [facility]-Cardiac & Pulmonary Rehab    Staff Present Nada Maclachlan, BA, ACSM CEP, Exercise Physiologist;Aaban Griep Eliezer Bottom, MS, ASCM CEP, Exercise Physiologist    Virtual Visit No    Medication changes reported     No    Fall or balance concerns reported    No    Warm-up and Cool-down Performed on first and last piece of equipment    Resistance Training Performed No   yoga   VAD Patient? No    PAD/SET Patient? No                Social History   Tobacco Use  Smoking Status Former   Types: Cigarettes   Quit date: 07/09/1979   Years since quitting: 41.3  Smokeless Tobacco Never    Goals Met:  Exercise tolerated well Queuing for purse lip breathing No report of cardiac concerns or symptoms  Goals Unmet:  Not Applicable  Comments: Pt able to follow exercise prescription today without complaint.  Will continue to monitor for progression.   Yoga completed today by Nada Maclachlan, CEP.   Dr. Emily Filbert is Medical Director for [place].  Dr. Ottie Glazier is Medical Director for [facility] Pulmonary Rehabilitation.

## 2020-11-08 ENCOUNTER — Encounter: Payer: Medicare Other | Attending: Nurse Practitioner

## 2020-11-08 ENCOUNTER — Other Ambulatory Visit: Payer: Self-pay

## 2020-11-08 VITALS — Ht 67.25 in | Wt 228.4 lb

## 2020-11-08 DIAGNOSIS — Z853 Personal history of malignant neoplasm of breast: Secondary | ICD-10-CM | POA: Insufficient documentation

## 2020-11-08 DIAGNOSIS — J4530 Mild persistent asthma, uncomplicated: Secondary | ICD-10-CM | POA: Insufficient documentation

## 2020-11-08 NOTE — Progress Notes (Signed)
Daily Session Note  Patient Details  Name: Melissa Merritt MRN: [id number] Date of Birth: June 06, 1950 Referring Provider:   April Manson Cancer Associated Rehabilitation & Exercise from 06/28/2020 in [facility] Cardiac and Pulmonary Rehab  Referring Provider [name], NP       Encounter Date: 11/08/2020  Check In:  Session Check In - 11/08/20 1243       Check-In   Supervising physician immediately available to respond to emergencies See telemetry face sheet for immediately available ER MD    Location [facility]-Cardiac & Pulmonary Rehab    Staff Present Birdie Sons, MPA, Nino Glow, MS, ASCM CEP, Exercise Physiologist;Melissa Caiola, RDN, LDN    Virtual Visit No    Medication changes reported     No    Fall or balance concerns reported    No    Tobacco Cessation No Change    Warm-up and Cool-down Performed on first and last piece of equipment    Resistance Training Performed Yes    VAD Patient? No    PAD/SET Patient? No      Pain Assessment   Currently in Pain? No/denies              6 Minute Walk     Row Name 06/28/20 1438 11/08/20 1508       6 Minute Walk   Phase Initial Discharge    Distance 1077 feet 1290 feet    Distance % Change -- 19.7 %    Distance Feet Change -- 213 ft    Walk Time 6 minutes 6 minutes    # of Rest Breaks 0 0    MPH 2.03 2.44    METS 2.36 2.71    RPE 9 11    Perceived Dyspnea  2 2    VO2 Peak 8.26 9.51    Symptoms No No    Resting HR 85 bpm 81 bpm    Resting BP 140/78 140/76    Resting Oxygen Saturation  95 % 95 %    Exercise Oxygen Saturation  during 6 min walk 84 % 88 %    Max Ex. HR 107 bpm 113 bpm    Max Ex. BP 162/80 158/70    2 Minute Post BP 136/78 --               Social History   Tobacco Use  Smoking Status Former   Types: Cigarettes   Quit date: 07/09/1979   Years since quitting: 41.3  Smokeless Tobacco Never    Goals Met:  Independence with exercise equipment Exercise tolerated well No report of  cardiac concerns or symptoms Strength training completed today  Goals Unmet:  Not Applicable  Comments: Pt able to follow exercise prescription today without complaint.  Will continue to monitor for progression.    Dr. Emily Filbert is Medical Director for [place].  Dr. Ottie Glazier is Medical Director for [facility] Pulmonary Rehabilitation.

## 2020-11-10 ENCOUNTER — Other Ambulatory Visit: Payer: Self-pay

## 2020-11-10 DIAGNOSIS — Z853 Personal history of malignant neoplasm of breast: Secondary | ICD-10-CM

## 2020-11-10 NOTE — Patient Instructions (Addendum)
CARE Discharge Patient Instructions  Patient Details  Name: Melissa Merritt MRN: [id number] Date of Birth: May 22, 1950 Referring Provider:  Venita Lick, NP   Number of Visits: 69  Reason for Discharge:  Patient reached a stable level of exercise. Patient independent in their exercise. Patient has met program and personal goals.  Smoking History:  Social History   Tobacco Use  Smoking Status Former   Types: Cigarettes   Quit date: 07/09/1979   Years since quitting: 41.3  Smokeless Tobacco Never    Diagnosis:  History of breast cancer  Initial Exercise Prescription:  Initial Exercise Prescription - 06/28/20 1400       Date of Initial Exercise RX and Referring Provider   Date 06/28/20    Referring Provider [name], NP      Treadmill   MPH 1.8    Grade 0    Minutes 15    METs 2.38      Recumbant Bike   Level 1    RPM 60    Minutes 15    METs 2.3      NuStep   Level 2    SPM 80    Minutes 15    METs 2.3      REL-XR   Level 1    Speed 50    Minutes 15    METs 2.3      Prescription Details   Frequency (times per week) 2    Duration Progress to 30 minutes of continuous aerobic without signs/symptoms of physical distress      Intensity   THRR 40-80% of Max Heartrate 110-136    Ratings of Perceived Exertion 11-13    Perceived Dyspnea 0-4      Progression   Progression Continue to progress workloads to maintain intensity without signs/symptoms of physical distress.      Resistance Training   Training Prescription Yes    Weight 3 lb    Reps 10-15             Discharge Exercise Prescription (Final Exercise Prescription Changes):  Exercise Prescription Changes - 11/10/20 0800       Response to Exercise   Blood Pressure (Admit) 110/68    Blood Pressure (Exercise) 134/68    Blood Pressure (Exit) 122/68    Heart Rate (Admit) 81 bpm    Heart Rate (Exercise) 102 bpm    Heart Rate (Exit) 93 bpm    Oxygen Saturation (Admit) 93 %     Oxygen Saturation (Exercise) 92 %    Oxygen Saturation (Exit) 94 %    Rating of Perceived Exertion (Exercise) 13    Perceived Dyspnea (Exercise) 2    Symptoms none    Duration Continue with 30 min of aerobic exercise without signs/symptoms of physical distress.      Progression   Progression Continue to progress workloads to maintain intensity without signs/symptoms of physical distress.    Average METs 2.5      Resistance Training   Training Prescription Yes    Weight 5 lb    Reps 10-15      NuStep   Level 4    Minutes 15    METs 2.3      T5 Nustep   Level 6    Minutes 15    METs 2.4      Track   Laps 45    Minutes 15    METs 3.45  Functional Capacity:  6 Minute Walk     Row Name 06/28/20 1438 11/08/20 1508       6 Minute Walk   Phase Initial Discharge    Distance 1077 feet 1290 feet    Distance % Change -- 19.7 %    Distance Feet Change -- 213 ft    Walk Time 6 minutes 6 minutes    # of Rest Breaks 0 0    MPH 2.03 2.44    METS 2.36 2.71    RPE 9 11    Perceived Dyspnea  2 2    VO2 Peak 8.26 9.51    Symptoms No No    Resting HR 85 bpm 81 bpm    Resting BP 140/78 140/76    Resting Oxygen Saturation  95 % 95 %    Exercise Oxygen Saturation  during 6 min walk 84 % 88 %    Max Ex. HR 107 bpm 113 bpm    Max Ex. BP 162/80 158/70    2 Minute Post BP 136/78 --             Nutrition & Weight - Outcomes:   Post Biometrics - 11/10/20 0818        Post  Biometrics   Height 5' 7.25" (1.708 m)    Weight 228 lb 6.4 oz (103.6 kg)    BMI (Calculated) 35.51             Goals reviewed with patient; copy given to patient.

## 2020-11-10 NOTE — Progress Notes (Signed)
Daily Session Note  Patient Details  Name: Melissa Merritt MRN: [id number] Date of Birth: 01-18-1951 Referring Provider:   April Manson Cancer Associated Rehabilitation & Exercise from 06/28/2020 in [facility] Cardiac and Pulmonary Rehab  Referring Provider [name], NP       Encounter Date: 11/10/2020  Check In:  Session Check In - 11/10/20 1303       Check-In   Supervising physician immediately available to respond to emergencies See telemetry face sheet for immediately available ER MD    Location [facility]-Cardiac & Pulmonary Rehab    Staff Present Coralie Keens, MS, ASCM CEP, Exercise Physiologist;Hadlyn Amero Oletta Darter, BA, ACSM CEP, Exercise Physiologist    Virtual Visit No    Medication changes reported     No    Fall or balance concerns reported    No    Tobacco Cessation No Change    Warm-up and Cool-down Performed on first and last piece of equipment    Resistance Training Performed Yes    VAD Patient? No    PAD/SET Patient? No      Pain Assessment   Currently in Pain? No/denies    Multiple Pain Sites No              Social History   Tobacco Use  Smoking Status Former   Types: Cigarettes   Quit date: 07/09/1979   Years since quitting: 41.3  Smokeless Tobacco Never    Goals Met:  Proper associated with RPD/PD & O2 Sat Independence with exercise equipment Exercise tolerated well Strength training completed today  Goals Unmet:  Not Applicable  Comments: Pt able to follow exercise prescription today without complaint.  Will continue to monitor for progression.    Dr. Emily Filbert is Medical Director for [place].  Dr. Ottie Glazier is Medical Director for [facility] Pulmonary Rehabilitation.

## 2020-11-15 ENCOUNTER — Inpatient Hospital Stay (HOSPITAL_BASED_OUTPATIENT_CLINIC_OR_DEPARTMENT_OTHER): Payer: Medicare Other | Admitting: Internal Medicine

## 2020-11-15 ENCOUNTER — Other Ambulatory Visit: Payer: Self-pay

## 2020-11-15 ENCOUNTER — Inpatient Hospital Stay: Payer: Medicare Other | Attending: Internal Medicine

## 2020-11-15 DIAGNOSIS — D509 Iron deficiency anemia, unspecified: Secondary | ICD-10-CM | POA: Insufficient documentation

## 2020-11-15 DIAGNOSIS — E538 Deficiency of other specified B group vitamins: Secondary | ICD-10-CM

## 2020-11-15 DIAGNOSIS — R5382 Chronic fatigue, unspecified: Secondary | ICD-10-CM | POA: Insufficient documentation

## 2020-11-15 DIAGNOSIS — Z170 Estrogen receptor positive status [ER+]: Secondary | ICD-10-CM

## 2020-11-15 DIAGNOSIS — C50911 Malignant neoplasm of unspecified site of right female breast: Secondary | ICD-10-CM | POA: Insufficient documentation

## 2020-11-15 DIAGNOSIS — Z79899 Other long term (current) drug therapy: Secondary | ICD-10-CM | POA: Insufficient documentation

## 2020-11-15 DIAGNOSIS — Z79811 Long term (current) use of aromatase inhibitors: Secondary | ICD-10-CM | POA: Insufficient documentation

## 2020-11-15 DIAGNOSIS — Z853 Personal history of malignant neoplasm of breast: Secondary | ICD-10-CM

## 2020-11-15 DIAGNOSIS — C50411 Malignant neoplasm of upper-outer quadrant of right female breast: Secondary | ICD-10-CM | POA: Diagnosis not present

## 2020-11-15 DIAGNOSIS — M8580 Other specified disorders of bone density and structure, unspecified site: Secondary | ICD-10-CM | POA: Insufficient documentation

## 2020-11-15 DIAGNOSIS — M7989 Other specified soft tissue disorders: Secondary | ICD-10-CM | POA: Diagnosis not present

## 2020-11-15 LAB — CBC WITH DIFFERENTIAL/PLATELET
Abs Immature Granulocytes: 0.1 10*3/uL — ABNORMAL HIGH (ref 0.00–0.07)
Basophils Absolute: 0.1 10*3/uL (ref 0.0–0.1)
Basophils Relative: 1 %
Eosinophils Absolute: 0.4 10*3/uL (ref 0.0–0.5)
Eosinophils Relative: 6 %
HCT: 36.5 % (ref 36.0–46.0)
Hemoglobin: 12.4 g/dL (ref 12.0–15.0)
Immature Granulocytes: 2 %
Lymphocytes Relative: 17 %
Lymphs Abs: 1.2 10*3/uL (ref 0.7–4.0)
MCH: 29.9 pg (ref 26.0–34.0)
MCHC: 34 g/dL (ref 30.0–36.0)
MCV: 88 fL (ref 80.0–100.0)
Monocytes Absolute: 0.7 10*3/uL (ref 0.1–1.0)
Monocytes Relative: 11 %
Neutro Abs: 4.2 10*3/uL (ref 1.7–7.7)
Neutrophils Relative %: 63 %
Platelets: 229 10*3/uL (ref 150–400)
RBC: 4.15 MIL/uL (ref 3.87–5.11)
RDW: 13.9 % (ref 11.5–15.5)
WBC: 6.7 10*3/uL (ref 4.0–10.5)
nRBC: 0 % (ref 0.0–0.2)

## 2020-11-15 LAB — COMPREHENSIVE METABOLIC PANEL
ALT: 20 U/L (ref 0–44)
AST: 21 U/L (ref 15–41)
Albumin: 3.8 g/dL (ref 3.5–5.0)
Alkaline Phosphatase: 82 U/L (ref 38–126)
Anion gap: 6 (ref 5–15)
BUN: 17 mg/dL (ref 8–23)
CO2: 29 mmol/L (ref 22–32)
Calcium: 9.5 mg/dL (ref 8.9–10.3)
Chloride: 102 mmol/L (ref 98–111)
Creatinine, Ser: 0.86 mg/dL (ref 0.44–1.00)
GFR, Estimated: >60 mL/min (ref >60)
Glucose, Bld: 126 mg/dL — ABNORMAL HIGH (ref 70–99)
Potassium: 3.8 mmol/L (ref 3.5–5.1)
Sodium: 137 mmol/L (ref 135–145)
Total Bilirubin: 0.5 mg/dL (ref 0.3–1.2)
Total Protein: 7.4 g/dL (ref 6.5–8.1)

## 2020-11-15 LAB — FOLATE: Folate: 27 ng/mL (ref >5.9)

## 2020-11-15 MED ORDER — EXEMESTANE 25 MG PO TABS: 25.0000 mg | ORAL_TABLET | Freq: Every day | ORAL | 3 refills | Status: DC

## 2020-11-15 NOTE — Assessment & Plan Note (Addendum)
#  Stage Ia right breast cancer status postlumpectomy and sentinel lymph node biopsy.  Pathology revealed a 4 mm grade 1 tumor, ER/PR positive, HER2/neu negative.  Completed adjuvant radiation therapy on 11/17/2019.  Started tamoxifen on 11/27/2019.  Discontinued in December secondary to multiple complaints.  Initiated letrozole on 04/12/20; stopped in 9th May 2022 sec to leg swelling.  #Given the importance of adding therapy-recommend initiation of Aromasin.  New prescription sent.  #Leg swelling question related to letrozole.;  Patient again on Lasix.  Start Aromasin.    #Obesity : discussed importance of healthy weight/and weight loss.  Strongly recommend eating more green leafy vegetables and cutting down processed food/ carbohydrates.  Instead increasing whole grains / protein in the diet.  Multiple studies have shown that optimal weight would help improve cardiovascular risk; also shown to cut on the risk of malignancies-colon cancer, breast cancer ovarian/uterine cancer in women and also prostate cancer in men.   #  Osteopenia-bone density scan in April 2021 consistent with osteopenia.  Labs reviewed and acceptable for initiation of Prolia today.  She will receive this every 6 months-Next around November 2022.  She has received dental clearance from Dr. Gloriann Loan.  Continue calcium 1200 mg and vitamin D 1000 IU daily along with weightbearing exercise as tolerated.  Plan to repeat bone density in April 2023  #   Iron deficiency anemia-history of iron deficiency anemia.  On oral iron.  Not anemic on most recent blood work.  Iron studies pending today.  Continue oral iron in the interim.  # B12 deficiency.  History of B12 deficiency.  Currently on oral B12 3 times a week-stable.               #   Leg swelling-unclear if medication side effect versus cardiac etiology.  R s/p cardiology evaluation.  Stable.  # DISPOSITION: # follow up- in 3 months- MD; no labs- Dr.B

## 2020-11-15 NOTE — Progress Notes (Signed)
[facility] NOTE  Patient Care Team: Venita Lick, NP as PCP - General (Nurse Practitioner) Idelle Leech, OD (Optometry) Isaias Cowman, MD as Consulting Physician (Cardiology) Manya Silvas, MD (Inactive) (Gastroenterology) Lucky Cowboy Erskine Squibb, MD as Referring Physician (Vascular Surgery) Clabe Seal, PA-C Jannet Mantis, MD (Dermatology) Theodore Demark, RN as Oncology Nurse Navigator Lequita Asal, MD (Inactive) as Referring Physician (Hematology and Oncology) Benjamine Sprague, DO as Consulting Physician (Surgery) Noreene Filbert, MD as Radiation Oncologist (Radiation Oncology)  CHIEF COMPLAINTS/PURPOSE OF CONSULTATION: right breast cancer   #  Oncology History Overview Note  Bilateral diagnostic mammogram on 07/08/2019 revealed persistent architectural distortion involving the upper outer quadrant of the right breast without sonographic correlate.  There was no pathologic right axillary lymphadenopathy.  No mammographic or sonographic evidence of malignancy in the left breast.  CA27.29 was 19.0 (07/24/19) at diagnosis.   Patient underwent stereotactic biopsy on 07/17/2019.  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  Intermediate nuclear grade with central necrosis.  Consistent with stage Ia right breast cancer.  She underwent partial mastectomy and right axillary sentinel lymph node biopsy on 08/13/2019.  Pathology revealed grade 1 invasive mammary carcinoma, no special type, and intermediate grade DCIS.  There is atypical lobular hyperplasia with ductal involvement.  Additional posterior margin revealed no evidence of residual carcinoma or DCIS.  Closest margin was > 10 mm and 2 mm for DCIS.  1 lymph node was negative for malignancy.  Tumor was ER/PR positive and HER-2/neu negative.  Pathologic stage was pT1a pN0 (sn).   She received 5040 cGy to her right breast and 1400 cGy boost to her scar from 09/28/2019-11/17/2019.   She began tamoxifen on 11/27/2019.  Tamoxifen discontinued in December 2021 due to intolerable side effects.  She was switched to Femara, started 04/12/20.   Baseline bone density scan in April 2021 consistent with osteopenia with T score of -1.5. Plan for prolia.    Malignant neoplasm of right breast in female, estrogen receptor positive ([place])  07/24/2019 Initial Diagnosis   Malignant neoplasm of right breast in female, estrogen receptor positive ([place])      HISTORY OF PRESENTING ILLNESS:  Melissa Merritt 70 y.o.  female with stage I ER/PR positive HER2 negative breast cancer is here for follow-up.  Patient stopped her letrozole approximately 3 months ago.  Patient admits to leg swelling on letrozole.  Patient eval by cardiology.  On Lasix.  Otherwise no lumps or bumps.  Chronic fatigue.  Concern for weight gain.   Review of Systems  Constitutional:  Positive for malaise/fatigue. Negative for chills, diaphoresis and fever.  HENT:  Negative for nosebleeds and sore throat.   Eyes:  Negative for double vision.  Respiratory:  Negative for cough, hemoptysis, sputum production, shortness of breath and wheezing.   Cardiovascular:  Negative for chest pain, palpitations, orthopnea and leg swelling.  Gastrointestinal:  Negative for abdominal pain, blood in stool, constipation, diarrhea, heartburn, melena, nausea and vomiting.  Genitourinary:  Negative for dysuria, frequency and urgency.  Musculoskeletal:  Positive for back pain and joint pain.  Skin: Negative.  Negative for itching and rash.  Neurological:  Negative for dizziness, tingling, focal weakness, weakness and headaches.  Endo/Heme/Allergies:  Does not bruise/bleed easily.  Psychiatric/Behavioral:  Negative for depression. The patient is not nervous/anxious and does not have insomnia.     MEDICAL HISTORY:  Past Medical History:  Diagnosis Date   Benign tumor of lung  Breast cancer ([place])    Cancer ([place])    right breast   CHF  (congestive heart failure) (HCC)    Dyspnea    GERD (gastroesophageal reflux disease)    Hyperlipidemia    Hypertension    Insomnia    Osteopenia    Personal history of radiation therapy    Plantar fasciitis    PONV (postoperative nausea and vomiting)    vomiting 2 days after lung surgery   S/P pneumonectomy    right lower lobe   Urinary incontinence     SURGICAL HISTORY: Past Surgical History:  Procedure Laterality Date   BREAST BIOPSY Right 07/17/2019   Affirm bx-"Ribbon" clip-path pending   BREAST LUMPECTOMY     bronchoscopy     CATARACT EXTRACTION, BILATERAL     Left- 11/21/15, right- 12/23/15   CHOLECYSTECTOMY     CYSTECTOMY  11/03/09   cheek   dilation and curretage     EYE SURGERY Bilateral    cataract   FOOT SURGERY Bilateral 2010   LOBECTOMY     LUNG SURGERY Right    PART MASTECTOMY,RADIO FREQUENCY LOCALIZER,AXILLARY SENTINEL NODE BIOPSY Right 08/13/2019   Procedure: PART MASTECTOMY,RADIO FREQUENCY LOCALIZER,AXILLARY SENTINEL NODE BIOPSY;  Surgeon: Benjamine Sprague, DO;  Location: [facility] ORS;  Service: General;  Laterality: Right;   UPPER GI ENDOSCOPY  2005    SOCIAL HISTORY: Social History   Socioeconomic History   Marital status: Married    Spouse name: Not on file   Number of children: Not on file   Years of education: Not on file   Highest education level: Some college, no degree  Occupational History   Occupation: retired   Tobacco Use   Smoking status: Former    Types: Cigarettes    Quit date: 07/09/1979    Years since quitting: 41.3   Smokeless tobacco: Never  Vaping Use   Vaping Use: Never used  Substance and Sexual Activity   Alcohol use: No   Drug use: No   Sexual activity: Yes  Other Topics Concern   Not on file  Social History Narrative   Not on file   Social Determinants of Health   Financial Resource Strain: Low Risk    Difficulty of Paying Living Expenses: Not hard at all  Food Insecurity: No Food Insecurity   Worried About Paediatric nurse in the Last Year: Never true   Arboriculturist in the Last Year: Never true  Transportation Needs: No Transportation Needs   Lack of Transportation (Medical): No   Lack of Transportation (Non-Medical): No  Physical Activity: Inactive   Days of Exercise per Week: 0 days   Minutes of Exercise per Session: 0 min  Stress: No Stress Concern Present   Feeling of Stress : Not at all  Social Connections: Not on file  Intimate Partner Violence: Not on file    FAMILY HISTORY: Family History  Problem Relation Age of Onset   COPD Mother    Hypertension Mother    Emphysema Mother    Osteoporosis Mother    Heart disease Father    Hyperlipidemia Sister    Hyperlipidemia Brother    Heart disease Maternal Grandmother    Heart disease Maternal Grandfather    Heart disease Paternal Grandmother    Stroke Paternal Grandmother    Heart disease Paternal Grandfather    Stroke Paternal Grandfather    Cancer Maternal Aunt        breast and lung   Osteoporosis  Maternal Aunt    Aortic aneurysm Maternal Aunt    Breast cancer Maternal Aunt 76    ALLERGIES:  is allergic to contrast media [iodinated diagnostic agents], other, ace inhibitors, hctz [hydrochlorothiazide], and keflex [cephalexin].  MEDICATIONS:  Current Outpatient Medications  Medication Sig Dispense Refill   amLODipine (NORVASC) 5 MG tablet Take 1 tablet (5 mg total) by mouth daily. 90 tablet 4   aspirin 81 MG tablet Take 81 mg by mouth daily.     Biotin 5000 MCG TABS Take 5,000 mcg by mouth 2 (two) times daily.     Calcium Carb-Cholecalciferol (CALCIUM-VITAMIN D) 600-400 MG-UNIT TABS Take 1 tablet by mouth 2 (two) times daily.     cholecalciferol (VITAMIN D3) 25 MCG (1000 UNIT) tablet Take 1,000 Units by mouth in the morning and at bedtime.     Cyanocobalamin (B-12) 2500 MCG TABS Take 2,500 mcg by mouth daily.     denosumab (PROLIA) 60 MG/ML SOSY injection Inject 60 mg into the skin every 6 (six) months.     exemestane  (AROMASIN) 25 MG tablet Take 1 tablet (25 mg total) by mouth daily after breakfast. 30 tablet 3   ferrous sulfate 220 (44 Fe) MG/5ML solution Take 220 mg by mouth daily.     furosemide (LASIX) 20 MG tablet Take by mouth.     ibuprofen (ADVIL) 200 MG tablet Take 400 mg by mouth every 6 (six) hours as needed for moderate pain.      metoprolol succinate (TOPROL-XL) 25 MG 24 hr tablet Take 1 tablet (25 mg total) by mouth daily. 90 tablet 4   pantoprazole (PROTONIX) 40 MG tablet Take 1 tablet (40 mg total) by mouth 2 (two) times daily. 180 tablet 4   Red Yeast Rice Extract (RED YEAST RICE PO) Take 1 tablet by mouth in the morning and at bedtime.     St Johns Wort 300 MG CAPS Take 300 mg by mouth 2 (two) times daily.     TRELEGY ELLIPTA 100-62.5-25 MCG/INH AEPB Inhale 1 puff into the lungs daily.     valsartan (DIOVAN) 160 MG tablet Take 1 tablet (160 mg total) by mouth daily. 90 tablet 4   folic acid (FOLVITE) 742 MCG tablet Take 400 mcg by mouth daily. (Patient not taking: No sig reported)     No current facility-administered medications for this visit.      Marland Kitchen  PHYSICAL EXAMINATION: ECOG PERFORMANCE STATUS: 0 - Asymptomatic  Vitals:   11/15/20 0904  BP: (!) 145/77  Pulse: 67  Resp: 18  Temp: 97.7 F (36.5 C)  SpO2: 95%   Filed Weights   11/15/20 0904  Weight: 228 lb 13.4 oz (103.8 kg)    Physical Exam Vitals and nursing note reviewed.  Constitutional:      Comments: Alone.  Ambulating independently.      HENT:     Head: Normocephalic and atraumatic.     Mouth/Throat:     Pharynx: Oropharynx is clear.  Eyes:     Extraocular Movements: Extraocular movements intact.     Pupils: Pupils are equal, round, and reactive to light.  Cardiovascular:     Rate and Rhythm: Normal rate and regular rhythm.  Pulmonary:     Comments: Decreased breath sounds bilaterally.  Abdominal:     Palpations: Abdomen is soft.  Musculoskeletal:        General: Normal range of motion.      Cervical back: Normal range of motion.  Skin:    General: Skin  is warm.  Neurological:     General: No focal deficit present.     Mental Status: She is alert and oriented to person, place, and time.  Psychiatric:        Behavior: Behavior normal.        Judgment: Judgment normal.     LABORATORY DATA:  I have reviewed the data as listed Lab Results  Component Value Date   WBC 6.7 11/15/2020   HGB 12.4 11/15/2020   HCT 36.5 11/15/2020   MCV 88.0 11/15/2020   PLT 229 11/15/2020   Recent Labs    11/23/19 1015 12/28/19 1054 03/28/20 0945 05/10/20 0922 08/08/20 1103 08/15/20 0830 11/15/20 0859  NA 140  --    < >  --  138 138 137  K 4.2  --    < >  --  3.8 3.6 3.8  CL 101  --    < >  --  101 106 102  CO2 25  --    < >  --  '28 25 29  ' GLUCOSE 103*  --    < >  --  137* 169* 126*  BUN 18  --    < >  --  '20 16 17  ' CREATININE 0.81  --    < >  --  0.85 0.79 0.86  CALCIUM 10.0  --    < >  --  9.7 9.2 9.5  GFRNONAA 74  --    < >  --  >60 >60 >60  GFRAA 85  --   --   --   --   --   --   PROT 6.7 7.2   < > 7.1 7.4  --  7.4  ALBUMIN 4.1 4.1   < > 3.6 4.0  --  3.8  AST 17 19   < > 21 27  --  21  ALT 19 14   < > 15 24  --  20  ALKPHOS 111 73   < > 72 76  --  82  BILITOT 0.3 0.6   < > 0.4 0.3  --  0.5  BILIDIR  --  <0.1  --  <0.1  --   --   --   IBILI  --  NOT CALCULATED  --  NOT CALCULATED  --   --   --    < > = values in this interval not displayed.    RADIOGRAPHIC STUDIES: I have personally reviewed the radiological images as listed and agreed with the findings in the report. No results found.  ASSESSMENT & PLAN:   Malignant neoplasm of right breast in female, estrogen receptor positive ([place]) #Stage Ia right breast cancer status postlumpectomy and sentinel lymph node biopsy.  Pathology revealed a 4 mm grade 1 tumor, ER/PR positive, HER2/neu negative.  Completed adjuvant radiation therapy on 11/17/2019.  Started tamoxifen on 11/27/2019.  Discontinued in December secondary to  multiple complaints.  Initiated letrozole on 04/12/20; stopped in 9th May 2022 sec to leg swelling.  #Given the importance of adding therapy-recommend initiation of Aromasin.  New prescription sent.  #Leg swelling question related to letrozole.;  Patient again on Lasix.  Start Aromasin.    #Obesity : discussed importance of healthy weight/and weight loss.  Strongly recommend eating more green leafy vegetables and cutting down processed food/ carbohydrates.  Instead increasing whole grains / protein in the diet.  Multiple studies have shown that optimal weight would help improve cardiovascular risk; also shown to  cut on the risk of malignancies-colon cancer, breast cancer ovarian/uterine cancer in women and also prostate cancer in men.   #  Osteopenia-bone density scan in April 2021 consistent with osteopenia.  Labs reviewed and acceptable for initiation of Prolia today.  She will receive this every 6 months-Next around November 2022.  She has received dental clearance from Dr. Gloriann Loan.  Continue calcium 1200 mg and vitamin D 1000 IU daily along with weightbearing exercise as tolerated.  Plan to repeat bone density in April 2023   #   Iron deficiency anemia-history of iron deficiency anemia.  On oral iron.  Not anemic on most recent blood work.  Iron studies pending today.  Continue oral iron in the interim.   # B12 deficiency.  History of B12 deficiency.  Currently on oral B12 3 times a week-stable.               #   Leg swelling-unclear if medication side effect versus cardiac etiology.  R s/p cardiology evaluation.  Stable.  # DISPOSITION: # follow up- in 3 months- MD; no labs- Dr.B    All questions were answered. The patient knows to call the clinic with any problems, questions or concerns.    Cammie Sickle, MD 11/15/2020 10:32 PM

## 2020-11-16 LAB — CANCER ANTIGEN 27.29: CA 27.29: 18.9 U/mL (ref 0.0–38.6)

## 2020-11-17 ENCOUNTER — Other Ambulatory Visit: Payer: Self-pay

## 2020-11-17 DIAGNOSIS — Z853 Personal history of malignant neoplasm of breast: Secondary | ICD-10-CM

## 2020-11-17 NOTE — Progress Notes (Signed)
Daily Session Note  Patient Details  Name: Melissa Merritt MRN: [id number] Date of Birth: 1950/11/07 Referring Provider:   April Manson Cancer Associated Rehabilitation & Exercise from 06/28/2020 in [facility] Cardiac and Pulmonary Rehab  Referring Provider [name], NP       Encounter Date: 11/17/2020  Check In:  Session Check In - 11/17/20 1346       Check-In   Supervising physician immediately available to respond to emergencies See telemetry face sheet for immediately available ER MD    Location [facility]-Cardiac & Pulmonary Rehab    Staff Present Nada Maclachlan, BA, ACSM CEP, Exercise Physiologist;Logyn Dedominicis Eliezer Bottom, MS, ASCM CEP, Exercise Physiologist    Virtual Visit No    Medication changes reported     Yes    Comments Started Examertane    Fall or balance concerns reported    No    Warm-up and Cool-down Performed on first and last piece of equipment    Resistance Training Performed Yes    VAD Patient? No    PAD/SET Patient? No                Social History   Tobacco Use  Smoking Status Former   Types: Cigarettes   Quit date: 07/09/1979   Years since quitting: 41.3  Smokeless Tobacco Never    Goals Met:  Independence with exercise equipment Exercise tolerated well No report of cardiac concerns or symptoms Strength training completed today  Goals Unmet:  Not Applicable  Comments: Pt able to follow exercise prescription today without complaint.  Will continue to monitor for progression.    Dr. Emily Filbert is Medical Director for [facility].  Dr. Ottie Glazier is Medical Director for [facility] Pulmonary Rehabilitation.

## 2020-11-19 ENCOUNTER — Encounter: Payer: Self-pay | Admitting: Nurse Practitioner

## 2020-11-22 ENCOUNTER — Other Ambulatory Visit: Payer: Self-pay

## 2020-11-22 ENCOUNTER — Ambulatory Visit (INDEPENDENT_AMBULATORY_CARE_PROVIDER_SITE_OTHER): Payer: Medicare Other | Admitting: Nurse Practitioner

## 2020-11-22 ENCOUNTER — Encounter: Payer: Self-pay | Admitting: Nurse Practitioner

## 2020-11-22 VITALS — BP 121/76 | HR 66 | Temp 98.0°F | Wt 229.4 lb

## 2020-11-22 DIAGNOSIS — I519 Heart disease, unspecified: Secondary | ICD-10-CM

## 2020-11-22 DIAGNOSIS — M8588 Other specified disorders of bone density and structure, other site: Secondary | ICD-10-CM

## 2020-11-22 DIAGNOSIS — K219 Gastro-esophageal reflux disease without esophagitis: Secondary | ICD-10-CM | POA: Diagnosis not present

## 2020-11-22 DIAGNOSIS — Z6835 Body mass index (BMI) 35.0-35.9, adult: Secondary | ICD-10-CM | POA: Diagnosis not present

## 2020-11-22 DIAGNOSIS — I10 Essential (primary) hypertension: Secondary | ICD-10-CM

## 2020-11-22 DIAGNOSIS — G4733 Obstructive sleep apnea (adult) (pediatric): Secondary | ICD-10-CM | POA: Diagnosis not present

## 2020-11-22 DIAGNOSIS — Z853 Personal history of malignant neoplasm of breast: Secondary | ICD-10-CM

## 2020-11-22 DIAGNOSIS — E782 Mixed hyperlipidemia: Secondary | ICD-10-CM | POA: Diagnosis not present

## 2020-11-22 DIAGNOSIS — J4530 Mild persistent asthma, uncomplicated: Secondary | ICD-10-CM

## 2020-11-22 DIAGNOSIS — R7301 Impaired fasting glucose: Secondary | ICD-10-CM | POA: Diagnosis not present

## 2020-11-22 NOTE — Assessment & Plan Note (Signed)
Noted on DEXA 08/04/19 -- continue Vitamin D daily + Prolia and recommend repeat DEXA in 5 years.

## 2020-11-22 NOTE — Assessment & Plan Note (Signed)
Ongoing, continue collaboration with oncology.  Recent notes reviewed. 

## 2020-11-22 NOTE — Assessment & Plan Note (Signed)
A1C today for recheck == A1c last check 5.6%.

## 2020-11-22 NOTE — Assessment & Plan Note (Signed)
Noted on echo, continue collaboration with cardiology and current medication regimen. 

## 2020-11-22 NOTE — Assessment & Plan Note (Signed)
BMI 35.66.  Recommended eating smaller high protein, low fat meals more frequently and exercising 30 mins a day 5 times a week with a goal of 10-15lb weight loss in the next 3 months. Patient voiced their understanding and motivation to adhere to these recommendations.

## 2020-11-22 NOTE — Assessment & Plan Note (Signed)
Chronic, stable with Trelegy.  Continue collaboration with pulmonary and current medications as prescribed by them.

## 2020-11-22 NOTE — Assessment & Plan Note (Signed)
Recommend she utilize CPAP 100% if prescribed -- CPAP nationwide shortage, she was made aware. Will alert pulmonary as well.

## 2020-11-22 NOTE — Progress Notes (Signed)
Daily Session Note  Patient Details  Name: Melissa Merritt MRN: [id number] Date of Birth: 05-06-1950 Referring Provider:   April Manson Cancer Associated Rehabilitation & Exercise from 06/28/2020 in [facility] Cardiac and Pulmonary Rehab  Referring Provider [name], NP       Encounter Date: 11/22/2020  Check In:  Session Check In - 11/22/20 1233       Check-In   Supervising physician immediately available to respond to emergencies See telemetry face sheet for immediately available ER MD    Location [facility]-Cardiac & Pulmonary Rehab    Staff Present Birdie Sons, MPA, RN;Melissa Tetherow, RDN, Tawanna Solo, MS, ASCM CEP, Exercise Physiologist    Virtual Visit No    Medication changes reported     No    Fall or balance concerns reported    No    Tobacco Cessation No Change    Warm-up and Cool-down Performed on first and last piece of equipment    Resistance Training Performed Yes    VAD Patient? No      Pain Assessment   Currently in Pain? No/denies                Social History   Tobacco Use  Smoking Status Former   Types: Cigarettes   Quit date: 07/09/1979   Years since quitting: 41.4  Smokeless Tobacco Never    Goals Met:  Independence with exercise equipment Exercise tolerated well No report of cardiac concerns or symptoms Strength training completed today  Goals Unmet:  Not Applicable  Comments: Pt able to follow exercise prescription today without complaint.  Will continue to monitor for progression.    Dr. Emily Filbert is Medical Director for [facility].  Dr. Ottie Glazier is Medical Director for [facility] Pulmonary Rehabilitation.

## 2020-11-22 NOTE — Assessment & Plan Note (Signed)
Chronic, stable with Protonix.  Continue current medication regimen.  Mag level today and annually.  Trial time off in future and assess if tolerated.

## 2020-11-22 NOTE — Progress Notes (Signed)
BP 121/76   Pulse 66   Temp 98 F (36.7 C) (Oral)   Wt 229 lb 6.4 oz (104.1 kg)   SpO2 97%   BMI 35.66 kg/m    Subjective:    Patient ID: Melissa Merritt, female    DOB: July 28, 1950, 70 y.o.   MRN: [id number]  HPI: Melissa Merritt is a 70 y.o. female  Chief Complaint  Patient presents with   Hyperlipidemia   Hypertension   Breast Cancer   Gastroesophageal Reflux   Sleep Apnea    Patient states she has no heard back from [facility] regarding her CPAP machine.    HYPERTENSION / HYPERLIPIDEMIA Current medications Valsartan, Metoprolol, Amlodipine, ASA, Lasix (recently added by cards).  Followed by Dr. Josefa Half, last saw in his office 09/02/20.  Last echo 08/24/20 with LV function LVEF 45-50% with mild reduced left ventricular function which is consistent with previous echos dating back to 2013.  Refuses statin therapy, even after at length discussion about risk reduction.  Is taking red yeast rice.   Satisfied with current treatment? yes Duration of hypertension: chronic BP monitoring frequency: a few times a week BP range: 130/70's at home BP medication side effects: no Duration of hyperlipidemia: chronic Cholesterol medication side effects: no Cholesterol supplements: red yeast rice Medication compliance: good compliance Aspirin: yes Recent stressors: no Recurrent headaches: no Visual changes: no Palpitations: no Dyspnea: none Chest pain: no Lower extremity edema: no Dizzy/lightheaded: no  The 10-year ASCVD risk score [name], et al., 2013) is: 13%   Values used to calculate the score:     Age: 9 years     Sex: Female     Is Non-Hispanic African American: No     Diabetic: No     Tobacco smoker: No     Systolic Blood Pressure: 123XX123 mmHg     Is BP treated: Yes     HDL Cholesterol: 55 mg/dL     Total Cholesterol: 227 mg/dL  GERD Continues Protonix 40 MG. GERD control status: stable Satisfied with current treatment? yes Heartburn frequency:  none Medication side effects: no  Medication compliance: stable Dysphagia: no Odynophagia:  no Hematemesis: no Blood in stool: no EGD: yes   BREAST CANCER: Being followed by oncology and receiving radiation treatment. Finished treatments 11/16/2019, continues to be followed by oncology every 3 months, last visit 11/15/20.  She was taken off Tamoxifen -- caused multiple side effects.  Taking Aromasin, currently doing well with this.  History of osteopenia on DEXA last year with oncology -- they have placed her on Prolia -- first injection 08/15/20.   SLEEP APNEA/ASTHMA Had study recently in February and is to have follow-up with machine -- has not heard from [facility] on equipment.  Saw pulmonary last on 10/05/20 for asthma -- taking Trelegy.   Sleep apnea status: uncontrolled Duration: chronic Satisfied with current treatment?:  yes CPAP use:  no Last sleep study: February and March 2021 Treatments attempted: none Wakes feeling refreshed:  no Daytime hypersomnolence:  yes Fatigue:  yes Insomnia:  yes Good sleep hygiene:  yes Difficulty falling asleep:  yes Difficulty staying asleep:  yes Snoring bothers bed partner:  yes Observed apnea by bed partner: yes Obesity:  yes Hypertension: yes  Pulmonary hypertension:  no Coronary artery disease:  no   IFG Noted on labs, but last A1c in February was 5.6%. Polydipsia/polyuria: no Visual disturbance: no Chest pain: no Paresthesias: no  Relevant past medical, surgical, family and social  history reviewed and updated as indicated. Interim medical history since our last visit reviewed. Allergies and medications reviewed and updated.  Review of Systems  Constitutional:  Negative for activity change, appetite change, diaphoresis, fatigue and fever.  Respiratory:  Negative for cough, chest tightness and shortness of breath.   Cardiovascular:  Negative for chest pain, palpitations and leg swelling.  Gastrointestinal: Negative.    Endocrine: Negative.   Psychiatric/Behavioral: Negative.     Per HPI unless specifically indicated above     Objective:    BP 121/76   Pulse 66   Temp 98 F (36.7 C) (Oral)   Wt 229 lb 6.4 oz (104.1 kg)   SpO2 97%   BMI 35.66 kg/m   Wt Readings from Last 3 Encounters:  11/22/20 229 lb 6.4 oz (104.1 kg)  11/15/20 228 lb 13.4 oz (103.8 kg)  11/10/20 228 lb 6.4 oz (103.6 kg)    Physical Exam Vitals and nursing note reviewed.  Constitutional:      General: She is awake. She is not in acute distress.    Appearance: She is well-developed. She is obese. She is not ill-appearing.  HENT:     Head: Normocephalic.     Right Ear: Hearing normal.     Left Ear: Hearing normal.  Eyes:     General: Lids are normal.        Right eye: No discharge.        Left eye: No discharge.     Conjunctiva/sclera: Conjunctivae normal.     Pupils: Pupils are equal, round, and reactive to light.  Neck:     Thyroid: No thyromegaly.     Vascular: No carotid bruit.  Cardiovascular:     Rate and Rhythm: Normal rate and regular rhythm.     Heart sounds: Normal heart sounds. No murmur heard.   No gallop.  Pulmonary:     Effort: Pulmonary effort is normal. No accessory muscle usage or respiratory distress.     Breath sounds: Normal breath sounds.  Abdominal:     General: Bowel sounds are normal.     Palpations: Abdomen is soft.  Musculoskeletal:     Cervical back: Normal range of motion and neck supple.     Right lower leg: No edema.     Left lower leg: No edema.  Skin:    General: Skin is warm and dry.  Neurological:     Mental Status: She is alert and oriented to person, place, and time.  Psychiatric:        Attention and Perception: Attention normal.        Mood and Affect: Mood normal.        Speech: Speech normal.        Behavior: Behavior normal. Behavior is cooperative.   Results for orders placed or performed in visit on 11/15/20  Cancer antigen 27.29  Result Value Ref Range   CA  27.29 18.9 0.0 - 38.6 U/mL  Comprehensive metabolic panel  Result Value Ref Range   Sodium 137 135 - 145 mmol/L   Potassium 3.8 3.5 - 5.1 mmol/L   Chloride 102 98 - 111 mmol/L   CO2 29 22 - 32 mmol/L   Glucose, Bld 126 (H) 70 - 99 mg/dL   BUN 17 8 - 23 mg/dL   Creatinine, Ser 0.86 0.44 - 1.00 mg/dL   Calcium 9.5 8.9 - 10.3 mg/dL   Total Protein 7.4 6.5 - 8.1 g/dL   Albumin 3.8 3.5 - 5.0 g/dL  AST 21 15 - 41 U/L   ALT 20 0 - 44 U/L   Alkaline Phosphatase 82 38 - 126 U/L   Total Bilirubin 0.5 0.3 - 1.2 mg/dL   GFR, Estimated >60 >60 mL/min   Anion gap 6 5 - 15  Folate  Result Value Ref Range   Folate 27.0 >5.9 ng/mL  CBC with Differential/Platelet  Result Value Ref Range   WBC 6.7 4.0 - 10.5 K/uL   RBC 4.15 3.87 - 5.11 MIL/uL   Hemoglobin 12.4 12.0 - 15.0 g/dL   HCT 36.5 36.0 - 46.0 %   MCV 88.0 80.0 - 100.0 fL   MCH 29.9 26.0 - 34.0 pg   MCHC 34.0 30.0 - 36.0 g/dL   RDW 13.9 11.5 - 15.5 %   Platelets 229 150 - 400 K/uL   nRBC 0.0 0.0 - 0.2 %   Neutrophils Relative % 63 %   Neutro Abs 4.2 1.7 - 7.7 K/uL   Lymphocytes Relative 17 %   Lymphs Abs 1.2 0.7 - 4.0 K/uL   Monocytes Relative 11 %   Monocytes Absolute 0.7 0.1 - 1.0 K/uL   Eosinophils Relative 6 %   Eosinophils Absolute 0.4 0.0 - 0.5 K/uL   Basophils Relative 1 %   Basophils Absolute 0.1 0.0 - 0.1 K/uL   Immature Granulocytes 2 %   Abs Immature Granulocytes 0.10 (H) 0.00 - 0.07 K/uL      Assessment & Plan:   Problem List Items Addressed This Visit       Cardiovascular and Mediastinum   Essential hypertension    Chronic, with BP at goal today in office and on home readings.  Continue current medication regimen and adjust as needed + collaboration with cardiology.  Continue to monitor BP at home regularly and document + focus on DASH diet.  CMP and TSH today.  Return in 6 months.      Relevant Orders   Comprehensive metabolic panel   TSH   Left ventricular dysfunction    Noted on echo, continue  collaboration with cardiology and current medication regimen.        Respiratory   Obstructive apnea    Recommend she utilize CPAP 100% if prescribed -- CPAP nationwide shortage, she was made aware. Will alert pulmonary as well.      Mild persistent asthma    Chronic, stable with Trelegy.  Continue collaboration with pulmonary and current medications as prescribed by them.        Digestive   GERD (gastroesophageal reflux disease)    Chronic, stable with Protonix.  Continue current medication regimen.  Mag level today and annually.  Trial time off in future and assess if tolerated.      Relevant Orders   Magnesium     Endocrine   IFG (impaired fasting glucose)    A1C today for recheck == A1c last check 5.6%.      Relevant Orders   HgB A1c     Musculoskeletal and Integument   Osteopenia of lumbar spine    Noted on DEXA 08/04/19 -- continue Vitamin D daily + Prolia and recommend repeat DEXA in 5 years.        Other   Hyperlipidemia    Chronic, ongoing.  Again reviewed ASCVD score with patient and discussed risks, she refuses statin, does not wish to take.  Continue red yeast rice and obtain labs today.  Return in 6 months.      Relevant Orders   Lipid Panel  w/o Chol/HDL Ratio   Obesity - Primary    BMI 35.66.  Recommended eating smaller high protein, low fat meals more frequently and exercising 30 mins a day 5 times a week with a goal of 10-15lb weight loss in the next 3 months. Patient voiced their understanding and motivation to adhere to these recommendations.       History of breast cancer    Ongoing, continue collaboration with oncology.  Recent notes reviewed.        Follow up plan: Return in about 6 months (around 05/25/2021) for HTN/HLD, OSA, GERD, IFG, BREAST CA.

## 2020-11-22 NOTE — Patient Instructions (Signed)
https://www.nhlbi.nih.gov/files/docs/public/heart/dash_brief.pdf">  DASH Eating Plan DASH stands for Dietary Approaches to Stop Hypertension. The DASH eating plan is a healthy eating plan that has been shown to: Reduce high blood pressure (hypertension). Reduce your risk for type 2 diabetes, heart disease, and stroke. Help with weight loss. What are tips for following this plan? Reading food labels Check food labels for the amount of salt (sodium) per serving. Choose foods with less than 5 percent of the Daily Value of sodium. Generally, foods with less than 300 milligrams (mg) of sodium per serving fit into this eating plan. To find whole grains, look for the word "whole" as the first word in the ingredient list. Shopping Buy products labeled as "low-sodium" or "no salt added." Buy fresh foods. Avoid canned foods and pre-made or frozen meals. Cooking Avoid adding salt when cooking. Use salt-free seasonings or herbs instead of table salt or sea salt. Check with your health care provider or pharmacist before using salt substitutes. Do not fry foods. Cook foods using healthy methods such as baking, boiling, grilling, roasting, and broiling instead. Cook with heart-healthy oils, such as olive, canola, avocado, soybean, or sunflower oil. Meal planning  Eat a balanced diet that includes: 4 or more servings of fruits and 4 or more servings of vegetables each day. Try to fill one-half of your plate with fruits and vegetables. 6-8 servings of whole grains each day. Less than 6 oz (170 g) of lean meat, poultry, or fish each day. A 3-oz (85-g) serving of meat is about the same size as a deck of cards. One egg equals 1 oz (28 g). 2-3 servings of low-fat dairy each day. One serving is 1 cup (237 mL). 1 serving of nuts, seeds, or beans 5 times each week. 2-3 servings of heart-healthy fats. Healthy fats called omega-3 fatty acids are found in foods such as walnuts, flaxseeds, fortified milks, and eggs.  These fats are also found in cold-water fish, such as sardines, salmon, and mackerel. Limit how much you eat of: Canned or prepackaged foods. Food that is high in trans fat, such as some fried foods. Food that is high in saturated fat, such as fatty meat. Desserts and other sweets, sugary drinks, and other foods with added sugar. Full-fat dairy products. Do not salt foods before eating. Do not eat more than 4 egg yolks a week. Try to eat at least 2 vegetarian meals a week. Eat more home-cooked food and less restaurant, buffet, and fast food.  Lifestyle When eating at a restaurant, ask that your food be prepared with less salt or no salt, if possible. If you drink alcohol: Limit how much you use to: 0-1 drink a day for women who are not pregnant. 0-2 drinks a day for men. Be aware of how much alcohol is in your drink. In the [place] one drink equals one 12 oz bottle of beer (355 mL), one 5 oz glass of wine (148 mL), or one 1 oz glass of hard liquor (44 mL). General information Avoid eating more than 2,300 mg of salt a day. If you have hypertension, you may need to reduce your sodium intake to 1,500 mg a day. Work with your health care provider to maintain a healthy body weight or to lose weight. Ask what an ideal weight is for you. Get at least 30 minutes of exercise that causes your heart to beat faster (aerobic exercise) most days of the week. Activities may include walking, swimming, or biking. Work with your health care provider   or dietitian to adjust your eating plan to your individual calorie needs. What foods should I eat? Fruits All fresh, dried, or frozen fruit. Canned fruit in natural juice (without addedsugar). Vegetables Fresh or frozen vegetables (raw, steamed, roasted, or grilled). Low-sodium or reduced-sodium tomato and vegetable juice. Low-sodium or reduced-sodium tomatosauce and tomato paste. Low-sodium or reduced-sodium canned vegetables. Grains Whole-grain or  whole-wheat bread. Whole-grain or whole-wheat pasta. Brown rice. Oatmeal. Quinoa. Bulgur. Whole-grain and low-sodium cereals. Pita bread.Low-fat, low-sodium crackers. Whole-wheat flour tortillas. Meats and other proteins Skinless chicken or turkey. Ground chicken or turkey. Pork with fat trimmed off. Fish and seafood. Egg whites. Dried beans, peas, or lentils. Unsalted nuts, nut butters, and seeds. Unsalted canned beans. Lean cuts of beef with fat trimmed off. Low-sodium, lean precooked or cured meat, such as sausages or meatloaves. Dairy Low-fat (1%) or fat-free (skim) milk. Reduced-fat, low-fat, or fat-free cheeses. Nonfat, low-sodium ricotta or cottage cheese. Low-fat or nonfatyogurt. Low-fat, low-sodium cheese. Fats and oils Soft margarine without trans fats. Vegetable oil. Reduced-fat, low-fat, or light mayonnaise and salad dressings (reduced-sodium). Canola, safflower, olive, avocado, soybean, andsunflower oils. Avocado. Seasonings and condiments Herbs. Spices. Seasoning mixes without salt. Other foods Unsalted popcorn and pretzels. Fat-free sweets. The items listed above may not be a complete list of foods and beverages you can eat. Contact a dietitian for more information. What foods should I avoid? Fruits Canned fruit in a light or heavy syrup. Fried fruit. Fruit in cream or buttersauce. Vegetables Creamed or fried vegetables. Vegetables in a cheese sauce. Regular canned vegetables (not low-sodium or reduced-sodium). Regular canned tomato sauce and paste (not low-sodium or reduced-sodium). Regular tomato and vegetable juice(not low-sodium or reduced-sodium). Pickles. Olives. Grains Baked goods made with fat, such as croissants, muffins, or some breads. Drypasta or rice meal packs. Meats and other proteins Fatty cuts of meat. Ribs. Fried meat. Bacon. Bologna, salami, and other precooked or cured meats, such as sausages or meat loaves. Fat from the back of a pig (fatback). Bratwurst.  Salted nuts and seeds. Canned beans with added salt. Canned orsmoked fish. Whole eggs or egg yolks. Chicken or turkey with skin. Dairy Whole or 2% milk, cream, and half-and-half. Whole or full-fat cream cheese. Whole-fat or sweetened yogurt. Full-fat cheese. Nondairy creamers. Whippedtoppings. Processed cheese and cheese spreads. Fats and oils Butter. Stick margarine. Lard. Shortening. Ghee. Bacon fat. Tropical oils, suchas coconut, palm kernel, or palm oil. Seasonings and condiments Onion salt, garlic salt, seasoned salt, table salt, and sea salt. Worcestershire sauce. Tartar sauce. Barbecue sauce. Teriyaki sauce. Soy sauce, including reduced-sodium. Steak sauce. Canned and packaged gravies. Fish sauce. Oyster sauce. Cocktail sauce. Store-bought horseradish. Ketchup. Mustard. Meat flavorings and tenderizers. Bouillon cubes. Hot sauces. Pre-made or packaged marinades. Pre-made or packaged taco seasonings. Relishes. Regular saladdressings. Other foods Salted popcorn and pretzels. The items listed above may not be a complete list of foods and beverages you should avoid. Contact a dietitian for more information. Where to find more information National Heart, Lung, and Blood Institute: www.nhlbi.nih.gov American Heart Association: www.heart.org Academy of Nutrition and Dietetics: www.eatright.org National Kidney Foundation: www.kidney.org Summary The DASH eating plan is a healthy eating plan that has been shown to reduce high blood pressure (hypertension). It may also reduce your risk for type 2 diabetes, heart disease, and stroke. When on the DASH eating plan, aim to eat more fresh fruits and vegetables, whole grains, lean proteins, low-fat dairy, and heart-healthy fats. With the DASH eating plan, you should limit salt (sodium) intake to 2,300   mg a day. If you have hypertension, you may need to reduce your sodium intake to 1,500 mg a day. Work with your health care provider or dietitian to adjust  your eating plan to your individual calorie needs. This information is not intended to replace advice given to you by your health care provider. Make sure you discuss any questions you have with your healthcare provider. Document Revised: 02/27/2019 Document Reviewed: 02/27/2019 Elsevier Patient Education  2022 [employer] Inc.  

## 2020-11-22 NOTE — Assessment & Plan Note (Signed)
Chronic, with BP at goal today in office and on home readings.  Continue current medication regimen and adjust as needed + collaboration with cardiology.  Continue to monitor BP at home regularly and document + focus on DASH diet.  CMP and TSH today.  Return in 6 months.

## 2020-11-22 NOTE — Assessment & Plan Note (Signed)
Chronic, ongoing.  Again reviewed ASCVD score with patient and discussed risks, she refuses statin, does not wish to take.  Continue red yeast rice and obtain labs today.  Return in 6 months. 

## 2020-11-24 ENCOUNTER — Encounter: Payer: Self-pay | Admitting: *Deleted

## 2020-11-24 ENCOUNTER — Other Ambulatory Visit: Payer: Self-pay

## 2020-11-24 ENCOUNTER — Encounter: Payer: Medicare Other | Admitting: *Deleted

## 2020-11-24 DIAGNOSIS — J4530 Mild persistent asthma, uncomplicated: Secondary | ICD-10-CM

## 2020-11-24 DIAGNOSIS — Z853 Personal history of malignant neoplasm of breast: Secondary | ICD-10-CM

## 2020-11-24 NOTE — Patient Instructions (Signed)
Patient Instructions  Patient Details  Name: Melissa Merritt MRN: [id number] Date of Birth: Nov 16, 1950 Referring Provider:  Ottie Glazier, MD  Below are your personal goals for exercise, nutrition, and risk factors. Our goal is to help you stay on track towards obtaining and maintaining these goals. We will be discussing your progress on these goals with you throughout the program.  Initial Exercise Prescription:  Initial Exercise Prescription - 11/24/20 1600       Date of Initial Exercise RX and Referring Provider   Date 11/24/20    Referring Provider Ottie Glazier MD      Recumbant Bike   Level 3    RPM 60    Watts 25    Minutes 15    METs 2.71      REL-XR   Level 3    Speed 50    Minutes 15    METs 2.71      T5 Nustep   Level 5    Minutes 15    METs 2.71      Track   Laps 40    Minutes 15    METs 3.18      Prescription Details   Frequency (times per week) 3    Duration Progress to 30 minutes of continuous aerobic without signs/symptoms of physical distress      Intensity   THRR 40-80% of Max Heartrate 108-135    Ratings of Perceived Exertion 11-13    Perceived Dyspnea 0-4      Progression   Progression Continue to progress workloads to maintain intensity without signs/symptoms of physical distress.      Resistance Training   Training Prescription Yes    Weight 5 lb    Reps 10-15             Exercise Goals: Frequency: Be able to perform aerobic exercise two to three times per week in program working toward 2-5 days per week of home exercise.  Intensity: Work with a perceived exertion of 11 (fairly light) - 15 (hard) while following your exercise prescription.  We will make changes to your prescription with you as you progress through the program.   Duration: Be able to do 30 to 45 minutes of continuous aerobic exercise in addition to a 5 minute warm-up and a 5 minute cool-down routine.   Nutrition Goals: Your personal nutrition goals will be  established when you do your nutrition analysis with the dietician.  The following are general nutrition guidelines to follow: Cholesterol < '200mg'$ /day Sodium < '1500mg'$ /day Fiber: Women over 50 yrs - 21 grams per day  Personal Goals:  Personal Goals and Risk Factors at Admission - 11/24/20 1614       Core Components/Risk Factors/Patient Goals on Admission    Weight Management Yes;Weight Loss    Intervention Weight Management: Develop a combined nutrition and exercise program designed to reach desired caloric intake, while maintaining appropriate intake of nutrient and fiber, sodium and fats, and appropriate energy expenditure required for the weight goal.;Weight Management: Provide education and appropriate resources to help participant work on and attain dietary goals.    Admit Weight 226 lb (102.5 kg)    Goal Weight: Short Term 220 lb (99.8 kg)    Goal Weight: Long Term 180 lb (81.6 kg)    Expected Outcomes Short Term: Continue to assess and modify interventions until short term weight is achieved;Long Term: Adherence to nutrition and physical activity/exercise program aimed toward attainment of established weight goal;Weight Loss: Understanding  of general recommendations for a balanced deficit meal plan, which promotes 1-2 lb weight loss per week and includes a negative energy balance of [phone] kcal/d;Understanding recommendations for meals to include 15-35% energy as protein, 25-35% energy from fat, 35-60% energy from carbohydrates, less than '200mg'$  of dietary cholesterol, 20-35 gm of total fiber daily;Understanding of distribution of calorie intake throughout the day with the consumption of 4-5 meals/snacks    Increase knowledge of respiratory medications and ability to use respiratory devices properly  Yes    Intervention Provide education and demonstration as needed of appropriate use of medications, inhalers, and oxygen therapy.    Expected Outcomes Short Term: Achieves understanding of  medications use. Understands that oxygen is a medication prescribed by physician. Demonstrates appropriate use of inhaler and oxygen therapy.;Long Term: Maintain appropriate use of medications, inhalers, and oxygen therapy.    Hypertension Yes    Intervention Provide education on lifestyle modifcations including regular physical activity/exercise, weight management, moderate sodium restriction and increased consumption of fresh fruit, vegetables, and low fat dairy, alcohol moderation, and smoking cessation.;Monitor prescription use compliance.    Expected Outcomes Short Term: Continued assessment and intervention until BP is < 140/55m HG in hypertensive participants. < 130/832mHG in hypertensive participants with diabetes, heart failure or chronic kidney disease.;Long Term: Maintenance of blood pressure at goal levels.    Lipids Yes    Intervention Provide education and support for participant on nutrition & aerobic/resistive exercise along with prescribed medications to achieve LDL '70mg'$ , HDL >'40mg'$ .    Expected Outcomes Short Term: Participant states understanding of desired cholesterol values and is compliant with medications prescribed. Participant is following exercise prescription and nutrition guidelines.;Long Term: Cholesterol controlled with medications as prescribed, with individualized exercise RX and with personalized nutrition plan. Value goals: LDL < '70mg'$ , HDL > 40 mg.             Tobacco Use Initial Evaluation: Social History   Tobacco Use  Smoking Status Former   Types: Cigarettes   Quit date: 07/09/1979   Years since quitting: 41.4  Smokeless Tobacco Never    Exercise Goals and Review:  Exercise Goals     Row Name 11/24/20 1613             Exercise Goals   Increase Physical Activity Yes       Intervention Provide advice, education, support and counseling about physical activity/exercise needs.;Develop an individualized exercise prescription for aerobic and  resistive training based on initial evaluation findings, risk stratification, comorbidities and participant's personal goals.       Expected Outcomes Long Term: Add in home exercise to make exercise part of routine and to increase amount of physical activity.;Short Term: Attend rehab on a regular basis to increase amount of physical activity.;Long Term: Exercising regularly at least 3-5 days a week.       Increase Strength and Stamina Yes       Intervention Provide advice, education, support and counseling about physical activity/exercise needs.;Develop an individualized exercise prescription for aerobic and resistive training based on initial evaluation findings, risk stratification, comorbidities and participant's personal goals.       Expected Outcomes Short Term: Increase workloads from initial exercise prescription for resistance, speed, and METs.;Short Term: Perform resistance training exercises routinely during rehab and add in resistance training at home;Long Term: Improve cardiorespiratory fitness, muscular endurance and strength as measured by increased METs and functional capacity (6MWT)       Able to understand and use rate of perceived exertion (  RPE) scale Yes       Intervention Provide education and explanation on how to use RPE scale       Expected Outcomes Short Term: Able to use RPE daily in rehab to express subjective intensity level;Long Term:  Able to use RPE to guide intensity level when exercising independently       Able to understand and use Dyspnea scale Yes       Intervention Provide education and explanation on how to use Dyspnea scale       Expected Outcomes Short Term: Able to use Dyspnea scale daily in rehab to express subjective sense of shortness of breath during exertion;Long Term: Able to use Dyspnea scale to guide intensity level when exercising independently       Knowledge and understanding of Target Heart Rate Range (THRR) Yes       Intervention Provide education and  explanation of THRR including how the numbers were predicted and where they are located for reference       Expected Outcomes Short Term: Able to state/look up THRR;Long Term: Able to use THRR to govern intensity when exercising independently;Short Term: Able to use daily as guideline for intensity in rehab       Able to check pulse independently Yes       Intervention Provide education and demonstration on how to check pulse in carotid and radial arteries.;Review the importance of being able to check your own pulse for safety during independent exercise       Expected Outcomes Short Term: Able to explain why pulse checking is important during independent exercise;Long Term: Able to check pulse independently and accurately       Understanding of Exercise Prescription Yes       Intervention Provide education, explanation, and written materials on patient's individual exercise prescription       Expected Outcomes Short Term: Able to explain program exercise prescription;Long Term: Able to explain home exercise prescription to exercise independently                Copy of goals given to participant.

## 2020-11-24 NOTE — Progress Notes (Signed)
CARE Daily Session Note  Patient Details  Name: Melissa Merritt MRN: [id number] Date of Birth: [birth year] Referring Provider:   April [facility] from 06/28/2020 in [facility]  Referring Provider [name], NP       Encounter Date: 11/24/2020  Check In:  Session Check In - 11/24/20 1446       Check-In   Supervising physician immediately available to respond to emergencies See telemetry face sheet for immediately available ER MD    Location [facility]-Cardiac & Pulmonary Rehab    Staff Present Coralie Keens, MS, ASCM CEP, Exercise Physiologist;Amanda Oletta Darter, BA, ACSM CEP, Exercise Physiologist;Kelly Rosalia Hammers, MPA, RN    Virtual Visit No    Medication changes reported     No    Fall or balance concerns reported    No    Warm-up and Cool-down Performed on first and last piece of equipment    Resistance Training Performed Yes    VAD Patient? No    PAD/SET Patient? No              Social History   Tobacco Use  Smoking Status Former   Types: Cigarettes   Quit date: 07/09/1979   Years since quitting: 41.4  Smokeless Tobacco Never    Goals Met:  Independence with exercise equipment Exercise tolerated well Personal goals reviewed No report of cardiac concerns or symptoms Strength training completed today  Goals Unmet:  Not Applicable  Comments:  Melissa Merritt graduated today from  rehab with 36 sessions completed.  Details of the patient's exercise prescription and what She needs to do in order to continue the prescription and progress were discussed with patient.  Patient was given a copy of prescription and goals.  Patient verbalized understanding.  Melissa Merritt plans to continue to exercise by attending the Pulmonary Rehab program.    Dr. Emily Filbert is Medical Director for [facility].  Dr. Ottie Glazier is Medical Director for [facility] Pulmonary Rehabilitation.

## 2020-11-24 NOTE — Progress Notes (Signed)
Pulmonary Individual Treatment Plan  Patient Details  Name: Melissa Merritt MRN: [id number] Date of Birth: 1951-03-06 Referring Provider:   Flowsheet Row Pulmonary Rehab from 11/24/2020 in [facility] Cardiac and Pulmonary Rehab  Referring Provider Ottie Glazier MD       Initial Encounter Date:  Flowsheet Row Pulmonary Rehab from 11/24/2020 in [facility] Cardiac and Pulmonary Rehab  Date 11/24/20       Visit Diagnosis: Mild persistent asthma without complication  Patient's Home Medications on Admission:  Current Outpatient Medications:    amLODipine (NORVASC) 5 MG tablet, Take 1 tablet (5 mg total) by mouth daily., Disp: 90 tablet, Rfl: 4   aspirin 81 MG tablet, Take 81 mg by mouth daily., Disp: , Rfl:    Biotin 5000 MCG TABS, Take 5,000 mcg by mouth 2 (two) times daily., Disp: , Rfl:    Calcium Carb-Cholecalciferol (CALCIUM-VITAMIN D) 600-400 MG-UNIT TABS, Take 1 tablet by mouth 2 (two) times daily., Disp: , Rfl:    cholecalciferol (VITAMIN D3) 25 MCG (1000 UNIT) tablet, Take 1,000 Units by mouth in the morning and at bedtime., Disp: , Rfl:    Cyanocobalamin (B-12) 2500 MCG TABS, Take 2,500 mcg by mouth daily., Disp: , Rfl:    denosumab (PROLIA) 60 MG/ML SOSY injection, Inject 60 mg into the skin every 6 (six) months., Disp: , Rfl:    exemestane (AROMASIN) 25 MG tablet, Take 1 tablet (25 mg total) by mouth daily after breakfast., Disp: 30 tablet, Rfl: 3   ferrous sulfate 220 (44 Fe) MG/5ML solution, Take 220 mg by mouth daily., Disp: , Rfl:    furosemide (LASIX) 20 MG tablet, Take by mouth., Disp: , Rfl:    ibuprofen (ADVIL) 200 MG tablet, Take 400 mg by mouth every 6 (six) hours as needed for moderate pain. , Disp: , Rfl:    metoprolol succinate (TOPROL-XL) 25 MG 24 hr tablet, Take 1 tablet (25 mg total) by mouth daily., Disp: 90 tablet, Rfl: 4   pantoprazole (PROTONIX) 40 MG tablet, Take 1 tablet (40 mg total) by mouth 2 (two) times daily., Disp: 180 tablet, Rfl: 4   Red Yeast Rice Extract  (RED YEAST RICE PO), Take 1 tablet by mouth in the morning and at bedtime., Disp: , Rfl:    St Johns Wort 300 MG CAPS, Take 300 mg by mouth 2 (two) times daily., Disp: , Rfl:    TRELEGY ELLIPTA 100-62.5-25 MCG/INH AEPB, Inhale 1 puff into the lungs daily., Disp: , Rfl:    valsartan (DIOVAN) 160 MG tablet, Take 1 tablet (160 mg total) by mouth daily., Disp: 90 tablet, Rfl: 4  Past Medical History: Past Medical History:  Diagnosis Date   Benign tumor of lung    Breast cancer ([place])    Cancer ([place])    right breast   CHF (congestive heart failure) ([facility])    Dyspnea    GERD (gastroesophageal reflux disease)    Hyperlipidemia    Hypertension    Insomnia    Osteopenia    Personal history of radiation therapy    Plantar fasciitis    PONV (postoperative nausea and vomiting)    vomiting 2 days after lung surgery   S/P pneumonectomy    right lower lobe   Urinary incontinence     Tobacco Use: Social History   Tobacco Use  Smoking Status Former   Types: Cigarettes   Quit date: 07/09/1979   Years since quitting: 41.4  Smokeless Tobacco Never    Labs: Recent Review Scientist, physiological  Labs for ITP Cardiac and Pulmonary Rehab Latest Ref Rng & Units 05/19/2018 11/17/2018 05/25/2019 11/23/2019 05/25/2020   Cholestrol 100 - 199 mg/dL 225(H) 217(H) 207(H) 207(H) 227(H)   LDLCALC 0 - 99 mg/dL 150(H) 127(H) 122(H) 128(H) 150(H)   HDL >39 mg/dL 52 58 59 54 55   Trlycerides 0 - 149 mg/dL 115 160(H) 146 139 124   Hemoglobin A1c 4.8 - 5.6 % - - - 5.6 5.6        Pulmonary Assessment Scores:  Pulmonary Assessment Scores     Row Name 11/24/20 1559         ADL UCSD   ADL Phase Entry     SOB Score total 47     Rest 1     Walk 2     Stairs 4     Bath 1     Dress 1     Shop 2           CAT Score   CAT Score 18              UCSD: Self-administered rating of dyspnea associated with activities of daily living (ADLs) 6-point scale (0 = "not at all" to 5 = "maximal or unable to do  because of breathlessness")  Scoring Scores range from 0 to 120.  Minimally important difference is 5 units  CAT: CAT can identify the health impairment of COPD patients and is better correlated with disease progression.  CAT has a scoring range of zero to 40. The CAT score is classified into four groups of low (less than 10), medium (10 - 20), high (21-30) and very high (31-40) based on the impact level of disease on health status. A CAT score over 10 suggests significant symptoms.  A worsening CAT score could be explained by an exacerbation, poor medication adherence, poor inhaler technique, or progression of COPD or comorbid conditions.  CAT MCID is 2 points  mMRC: mMRC (Modified Medical Research Council) Dyspnea Scale is used to assess the degree of baseline functional disability in patients of respiratory disease due to dyspnea. No minimal important difference is established. A decrease in score of 1 point or greater is considered a positive change.   Pulmonary Function Assessment:   Exercise Target Goals: Exercise Program Goal: Individual exercise prescription set using results from initial 6 min walk test and THRR while considering  patient's activity barriers and safety.   Exercise Prescription Goal: Initial exercise prescription builds to 30-45 minutes a day of aerobic activity, 2-3 days per week.  Home exercise guidelines will be given to patient during program as part of exercise prescription that the participant will acknowledge.  Education: Aerobic Exercise: - Group verbal and visual presentation on the components of exercise prescription. Introduces F.I.T.T principle from ACSM for exercise prescriptions.  Reviews F.I.T.T. principles of aerobic exercise including progression. Written material given at graduation.   Education: Resistance Exercise: - Group verbal and visual presentation on the components of exercise prescription. Introduces F.I.T.T principle from ACSM for  exercise prescriptions  Reviews F.I.T.T. principles of resistance exercise including progression. Written material given at graduation.    Education: Exercise & Equipment Safety: - Individual verbal instruction and demonstration of equipment use and safety with use of the equipment. Flowsheet Row Pulmonary Rehab from 11/24/2020 in [facility] Cardiac and Pulmonary Rehab  Education need identified 11/24/20  Date 11/24/20  Educator KL  Instruction Review Code 1- Verbalizes Understanding       Education: Exercise Physiology & General Exercise  Guidelines: - Group verbal and written instruction with models to review the exercise physiology of the cardiovascular system and associated critical values. Provides general exercise guidelines with specific guidelines to those with heart or lung disease.    Education: Flexibility, Balance, Mind/Body Relaxation: - Group verbal and visual presentation with interactive activity on the components of exercise prescription. Introduces F.I.T.T principle from ACSM for exercise prescriptions. Reviews F.I.T.T. principles of flexibility and balance exercise training including progression. Also discusses the mind body connection.  Reviews various relaxation techniques to help reduce and manage stress (i.e. Deep breathing, progressive muscle relaxation, and visualization). Balance handout provided to take home. Written material given at graduation.   Activity Barriers & Risk Stratification:  Activity Barriers & Cardiac Risk Stratification - 11/24/20 1524       Activity Barriers & Cardiac Risk Stratification   Activity Barriers Shortness of Breath             6 Minute Walk:  6 Minute Walk     Row Name 06/28/20 1438 11/08/20 1508 11/24/20 1523     6 Minute Walk   Phase Initial Discharge Initial  Same walk test used from CARE post 6MWT   Distance 1077 feet 1290 feet 1290 feet   Distance % Change -- 19.7 % --   Distance Feet Change -- 213 ft --   Walk Time 6  minutes 6 minutes 6 minutes   # of Rest Breaks 0 0 0   MPH 2.03 2.44 2.44   METS 2.36 2.71 2.71   RPE '9 11 11   '$ Perceived Dyspnea  '2 2 2   '$ VO2 Peak 8.26 9.51 9.51   Symptoms No No No   Resting HR 85 bpm 81 bpm 81 bpm   Resting BP 140/78 140/76 140/76   Resting Oxygen Saturation  95 % 95 % 95 %   Exercise Oxygen Saturation  during 6 min walk 84 % 88 % 88 %   Max Ex. HR 107 bpm 113 bpm 113 bpm   Max Ex. BP 162/80 158/70 158/70   2 Minute Post BP 136/78 -- 134/68           Oxygen Initial Assessment:  Oxygen Initial Assessment - 11/24/20 1559       Home Oxygen   Home Oxygen Device None    Sleep Oxygen Prescription None    Home Exercise Oxygen Prescription None    Home Resting Oxygen Prescription None      Initial 6 min Walk   Oxygen Used None      Program Oxygen Prescription   Program Oxygen Prescription None      Intervention   Short Term Goals To learn and demonstrate proper pursed lip breathing techniques or other breathing techniques. ;To learn and demonstrate proper use of respiratory medications    Long  Term Goals Demonstrates proper use of MDI's;Compliance with respiratory medication;Exhibits proper breathing techniques, such as pursed lip breathing or other method taught during program session             Oxygen Re-Evaluation:   Oxygen Discharge (Final Oxygen Re-Evaluation):   Initial Exercise Prescription:  Initial Exercise Prescription - 11/24/20 1600       Date of Initial Exercise RX and Referring Provider   Date 11/24/20    Referring Provider Ottie Glazier MD      Recumbant Bike   Level 3    RPM 60    Watts 25    Minutes 15    METs 2.71  REL-XR   Level 3    Speed 50    Minutes 15    METs 2.71      T5 Nustep   Level 5    Minutes 15    METs 2.71      Track   Laps 40    Minutes 15    METs 3.18      Prescription Details   Frequency (times per week) 3    Duration Progress to 30 minutes of continuous aerobic without  signs/symptoms of physical distress      Intensity   THRR 40-80% of Max Heartrate 108-135    Ratings of Perceived Exertion 11-13    Perceived Dyspnea 0-4      Progression   Progression Continue to progress workloads to maintain intensity without signs/symptoms of physical distress.      Resistance Training   Training Prescription Yes    Weight 5 lb    Reps 10-15             Perform Capillary Blood Glucose checks as needed.  Exercise Prescription Changes:   Exercise Prescription Changes     Row Name 06/28/20 1400 11/10/20 0800 11/24/20 1500         Response to Exercise   Blood Pressure (Admit) 140/78 110/68 140/76     Blood Pressure (Exercise) 162/80 134/68 158/70     Blood Pressure (Exit) 136/78 122/68 134/68     Heart Rate (Admit) 85 bpm 81 bpm 81 bpm     Heart Rate (Exercise) 107 bpm 102 bpm 113 bpm     Heart Rate (Exit) 90 bpm 93 bpm 85 bpm     Oxygen Saturation (Admit) 95 % 93 % 95 %     Oxygen Saturation (Exercise) 84 % 92 % 88 %     Oxygen Saturation (Exit) 95 % 94 % 97 %     Rating of Perceived Exertion (Exercise) '9 13 11     '$ Perceived Dyspnea (Exercise) '2 2 2     '$ Symptoms none none none     Comments walk test results -- Pulmonary rehab walk test results (used from CARE)     Duration -- Continue with 30 min of aerobic exercise without signs/symptoms of physical distress. --           Progression   Progression -- Continue to progress workloads to maintain intensity without signs/symptoms of physical distress. --     Average METs -- 2.5 --           Resistance Training   Training Prescription -- Yes --     Weight -- 5 lb --     Reps -- 10-15 --           NuStep   Level -- 4 --     Minutes -- 15 --     METs -- 2.3 --           T5 Nustep   Level -- 6 --     Minutes -- 15 --     METs -- 2.4 --           Track   Laps -- 45 --     Minutes -- 15 --     METs -- 3.45 --              Exercise Comments:   Exercise Goals and Review:    Exercise Goals     Row Name 11/24/20 [phone]  Exercise Goals   Increase Physical Activity Yes       Intervention Provide advice, education, support and counseling about physical activity/exercise needs.;Develop an individualized exercise prescription for aerobic and resistive training based on initial evaluation findings, risk stratification, comorbidities and participant's personal goals.       Expected Outcomes Long Term: Add in home exercise to make exercise part of routine and to increase amount of physical activity.;Short Term: Attend rehab on a regular basis to increase amount of physical activity.;Long Term: Exercising regularly at least 3-5 days a week.       Increase Strength and Stamina Yes       Intervention Provide advice, education, support and counseling about physical activity/exercise needs.;Develop an individualized exercise prescription for aerobic and resistive training based on initial evaluation findings, risk stratification, comorbidities and participant's personal goals.       Expected Outcomes Short Term: Increase workloads from initial exercise prescription for resistance, speed, and METs.;Short Term: Perform resistance training exercises routinely during rehab and add in resistance training at home;Long Term: Improve cardiorespiratory fitness, muscular endurance and strength as measured by increased METs and functional capacity (6MWT)       Able to understand and use rate of perceived exertion (RPE) scale Yes       Intervention Provide education and explanation on how to use RPE scale       Expected Outcomes Short Term: Able to use RPE daily in rehab to express subjective intensity level;Long Term:  Able to use RPE to guide intensity level when exercising independently       Able to understand and use Dyspnea scale Yes       Intervention Provide education and explanation on how to use Dyspnea scale       Expected Outcomes Short Term: Able to use Dyspnea scale  daily in rehab to express subjective sense of shortness of breath during exertion;Long Term: Able to use Dyspnea scale to guide intensity level when exercising independently       Knowledge and understanding of Target Heart Rate Range (THRR) Yes       Intervention Provide education and explanation of THRR including how the numbers were predicted and where they are located for reference       Expected Outcomes Short Term: Able to state/look up THRR;Long Term: Able to use THRR to govern intensity when exercising independently;Short Term: Able to use daily as guideline for intensity in rehab       Able to check pulse independently Yes       Intervention Provide education and demonstration on how to check pulse in carotid and radial arteries.;Review the importance of being able to check your own pulse for safety during independent exercise       Expected Outcomes Short Term: Able to explain why pulse checking is important during independent exercise;Long Term: Able to check pulse independently and accurately       Understanding of Exercise Prescription Yes       Intervention Provide education, explanation, and written materials on patient's individual exercise prescription       Expected Outcomes Short Term: Able to explain program exercise prescription;Long Term: Able to explain home exercise prescription to exercise independently                Exercise Goals Re-Evaluation :   Discharge Exercise Prescription (Final Exercise Prescription Changes):  Exercise Prescription Changes - 11/24/20 1500       Response to Exercise   Blood Pressure (Admit)  140/76    Blood Pressure (Exercise) 158/70    Blood Pressure (Exit) 134/68    Heart Rate (Admit) 81 bpm    Heart Rate (Exercise) 113 bpm    Heart Rate (Exit) 85 bpm    Oxygen Saturation (Admit) 95 %    Oxygen Saturation (Exercise) 88 %    Oxygen Saturation (Exit) 97 %    Rating of Perceived Exertion (Exercise) 11    Perceived Dyspnea (Exercise)  2    Symptoms none    Comments Pulmonary rehab walk test results (used from CARE)             Nutrition:  Target Goals: Understanding of nutrition guidelines, daily intake of sodium '1500mg'$ , cholesterol '200mg'$ , calories 30% from fat and 7% or less from saturated fats, daily to have 5 or more servings of fruits and vegetables.  Education: All About Nutrition: -Group instruction provided by verbal, written material, interactive activities, discussions, models, and posters to present general guidelines for heart healthy nutrition including fat, fiber, MyPlate, the role of sodium in heart healthy nutrition, utilization of the nutrition label, and utilization of this knowledge for meal planning. Follow up email sent as well. Written material given at graduation.   Biometrics:   Post Biometrics - 11/10/20 0818        Post  Biometrics   Height 5' 7.25" (1.708 m)    Weight 228 lb 6.4 oz (103.6 kg)    BMI (Calculated) 35.51             Nutrition Therapy Plan and Nutrition Goals:  Nutrition Therapy & Goals - 11/24/20 1616       Intervention Plan   Intervention Prescribe, educate and counsel regarding individualized specific dietary modifications aiming towards targeted core components such as weight, hypertension, lipid management, diabetes, heart failure and other comorbidities.    Expected Outcomes Short Term Goal: Understand basic principles of dietary content, such as calories, fat, sodium, cholesterol and nutrients.;Short Term Goal: A plan has been developed with personal nutrition goals set during dietitian appointment.;Long Term Goal: Adherence to prescribed nutrition plan.             Nutrition Assessments:  MEDIFICTS Score Key: ?70 Need to make dietary changes  40-70 Heart Healthy Diet ? 40 Therapeutic Level Cholesterol Diet  Flowsheet Row Pulmonary Rehab from 11/24/2020 in [facility] Cardiac and Pulmonary Rehab  Picture Your Plate Total Score on Admission 57       Picture Your Plate Scores: [id number] Unhealthy dietary pattern with much room for improvement. 41-50 Dietary pattern unlikely to meet recommendations for good health and room for improvement. 51-60 More healthful dietary pattern, with some room for improvement.  >60 Healthy dietary pattern, although there may be some specific behaviors that could be improved.   Nutrition Goals Re-Evaluation:  Nutrition Goals Re-Evaluation     [place] Name 10/27/20 1246             Goals   Nutrition Goal No goals: CARE       Comment She is the one that does the cooking - her husband is a potato person. She likes to go to [facility]. She drinks 12 cans of pepsi per day and has started to replace some with a fruit sweetened drink (18g CHO, 2g Sugar per serving). Discussed heart healthy eating/MyPlate and pulmonary friendly eating in addition to MNT during and after cancer treatment. She reports having a good appetite on Letrozole, but it does not make her feel good. Other medications made  her throw up. She reports slipping through the cracks at the oncologist and needing to call 8/1 to follow-up; she will take this opportunity to discuss how she has been off her letrozole. She wants to get a referral to attend Pulmonary Rehab after graduating CARE. Provided handouts.                Nutrition Goals Discharge (Final Nutrition Goals Re-Evaluation):  Nutrition Goals Re-Evaluation - 10/27/20 1246       Goals   Nutrition Goal No goals: CARE    Comment She is the one that does the cooking - her husband is a potato person. She likes to go to [facility]. She drinks 12 cans of pepsi per day and has started to replace some with a fruit sweetened drink (18g CHO, 2g Sugar per serving). Discussed heart healthy eating/MyPlate and pulmonary friendly eating in addition to MNT during and after cancer treatment. She reports having a good appetite on Letrozole, but it does not make her feel good. Other medications made her throw up. She  reports slipping through the cracks at the oncologist and needing to call 8/1 to follow-up; she will take this opportunity to discuss how she has been off her letrozole. She wants to get a referral to attend Pulmonary Rehab after graduating CARE. Provided handouts.             Psychosocial: Target Goals: Acknowledge presence or absence of significant depression and/or stress, maximize coping skills, provide positive support system. Participant is able to verbalize types and ability to use techniques and skills needed for reducing stress and depression.   Education: Stress, Anxiety, and Depression - Group verbal and visual presentation to define topics covered.  Reviews how body is impacted by stress, anxiety, and depression.  Also discusses healthy ways to reduce stress and to treat/manage anxiety and depression.  Written material given at graduation.   Education: Sleep Hygiene -Provides group verbal and written instruction about how sleep can affect your health.  Define sleep hygiene, discuss sleep cycles and impact of sleep habits. Review good sleep hygiene tips.    Initial Review & Psychosocial Screening:  Initial Psych Review & Screening - 11/24/20 1304       Initial Review   Current issues with None Identified      Family Dynamics   Good Support System? Yes   son and his family building house next door, another son and a daughter out of town     Barriers   Psychosocial barriers to participate in program There are no identifiable barriers or psychosocial needs.;The patient should benefit from training in stress management and relaxation.      Screening Interventions   Interventions Encouraged to exercise;Provide feedback about the scores to participant;To provide support and resources with identified psychosocial needs    Expected Outcomes Short Term goal: Utilizing psychosocial counselor, staff and physician to assist with identification of specific Stressors or current issues  interfering with healing process. Setting desired goal for each stressor or current issue identified.;Long Term Goal: Stressors or current issues are controlled or eliminated.;Short Term goal: Identification and review with participant of any Quality of Life or Depression concerns found by scoring the questionnaire.;Long Term goal: The participant improves quality of Life and PHQ9 Scores as seen by post scores and/or verbalization of changes             Quality of Life Scores:  Scores of 19 and below usually indicate a poorer quality of life in these  areas.  A difference of  2-3 points is a clinically meaningful difference.  A difference of 2-3 points in the total score of the Quality of Life Index has been associated with significant improvement in overall quality of life, self-image, physical symptoms, and general health in studies assessing change in quality of life.  PHQ-9: Recent Review Flowsheet Data     Depression screen [facility] 2/9 11/24/2020 05/30/2020 05/25/2020 11/23/2019 05/25/2019   Decreased Interest 0 0 0 0 0   Down, Depressed, Hopeless 0 0 0 0 0    PHQ - 2 Score 0 0 0 0 0   Altered sleeping 1 - - - -   Tired, decreased energy 1 - - - -   Change in appetite 0 - - - -   Feeling bad or failure about yourself  0 - - - -   Trouble concentrating 0 - - - -   Moving slowly or fidgety/restless 0 - - - -   Suicidal thoughts 0 - - - -   PHQ-9 Score 2 - - - -   Difficult doing work/chores Not difficult at all - - - -      Interpretation of Total Score  Total Score Depression Severity:  1-4 = Minimal depression, 5-9 = Mild depression, 10-14 = Moderate depression, 15-19 = Moderately severe depression, 20-27 = Severe depression   Psychosocial Evaluation and Intervention:  Psychosocial Evaluation - 11/24/20 1318       Psychosocial Evaluation & Interventions   Comments Arnett has no barriers to entering the program. She is ready to satrt and work on her symptom control. Nghi lives  with her husband. Their son and his family is building a house next door and until it is completed they are living in a camper at [employer].  Son, his wife and 8yrold twins.  Her husband has recently had a stroke, same time she was getting her cancer treatments. She states she does not feel stressed.  She has other children that live out of town.    Expected Outcomes STG: SJolondawill be able to attend all scheduled sessions, she will continue to maintain the low stress levels even with all the events happening in her life.  LTG: SBassywill continue to maintain her progression    Continue Psychosocial Services  Follow up required by staff             Psychosocial Re-Evaluation:   Psychosocial Discharge (Final Psychosocial Re-Evaluation):   Education: Education Goals: Education classes will be provided on a weekly basis, covering required topics. Participant will state understanding/return demonstration of topics presented.  Learning Barriers/Preferences:  Learning Barriers/Preferences - 11/24/20 1306       Learning Barriers/Preferences   Learning Barriers None    Learning Preferences None             General Pulmonary Education Topics:  Infection Prevention: - Provides verbal and written material to individual with discussion of infection control including proper hand washing and proper equipment cleaning during exercise session. [facility]  Education need identified 11/24/20  Date 11/24/20  Educator [place] Instruction Review Code 1- Verbalizes Understanding       Falls Prevention: - Provides verbal and written material to individual with discussion of falls prevention and safety. [facility]  Education need identified 11/24/20  Date 11/24/20  Educator [place] Instruction Review  Code 1- Verbalizes Understanding        Chronic Lung Disease Review: - Group verbal instruction with posters, models, PowerPoint presentations and videos,  to review new updates, new respiratory medications, new advancements in procedures and treatments. Providing information on websites and "800" numbers for continued self-education. Includes information about supplement oxygen, available portable oxygen systems, continuous and intermittent flow rates, oxygen safety, concentrators, and Medicare reimbursement for oxygen. Explanation of Pulmonary Drugs, including class, frequency, complications, importance of spacers, rinsing mouth after steroid MDI's, and proper cleaning methods for nebulizers. Review of basic lung anatomy and physiology related to function, structure, and complications of lung disease. Review of risk factors. Discussion about methods for diagnosing sleep apnea and types of masks and machines for OSA. Includes a review of the use of types of environmental controls: home humidity, furnaces, filters, dust mite/pet prevention, HEPA vacuums. Discussion about weather changes, air quality and the benefits of nasal washing. Instruction on Warning signs, infection symptoms, calling MD promptly, preventive modes, and value of vaccinations. Review of effective airway clearance, coughing and/or vibration techniques. Emphasizing that all should Create an Action Plan. Written material given at graduation.   AED/CPR: - Group verbal and written instruction with the use of models to demonstrate the basic use of the AED with the basic ABC's of resuscitation.    Anatomy and Cardiac Procedures: - Group verbal and visual presentation and models provide information about basic cardiac anatomy and function. Reviews the testing methods done to diagnose heart disease and the outcomes of the test results. Describes the treatment choices: Medical Management, Angioplasty, or Coronary Bypass Surgery for treating various heart conditions including  Myocardial Infarction, Angina, Valve Disease, and Cardiac Arrhythmias.  Written material given at graduation.   Medication Safety: - Group verbal and visual instruction to review commonly prescribed medications for heart and lung disease. Reviews the medication, class of the drug, and side effects. Includes the steps to properly store meds and maintain the prescription regimen.  Written material given at graduation.   Other: -Provides group and verbal instruction on various topics (see comments)   Knowledge Questionnaire Score:  Knowledge Questionnaire Score - 11/24/20 1602       Knowledge Questionnaire Score   Pre Score 16/18: Oxygen              Core Components/Risk Factors/Patient Goals at Admission:  Personal Goals and Risk Factors at Admission - 11/24/20 1614       Core Components/Risk Factors/Patient Goals on Admission    Weight Management Yes;Weight Loss    Intervention Weight Management: Develop a combined nutrition and exercise program designed to reach desired caloric intake, while maintaining appropriate intake of nutrient and fiber, sodium and fats, and appropriate energy expenditure required for the weight goal.;Weight Management: Provide education and appropriate resources to help participant work on and attain dietary goals.    Admit Weight 226 lb (102.5 kg)    Goal Weight: Short Term 220 lb (99.8 kg)    Goal Weight: Long Term 180 lb (81.6 kg)    Expected Outcomes Short Term: Continue to assess and modify interventions until short term weight is achieved;Long Term: Adherence to nutrition and physical activity/exercise program aimed toward attainment of established weight goal;Weight Loss: Understanding of general recommendations for a balanced deficit meal plan, which promotes 1-2 lb weight loss per week and includes a negative energy balance of [phone] kcal/d;Understanding recommendations for meals to include 15-35% energy as protein, 25-35% energy from fat, 35-60%  energy from carbohydrates, less  than '200mg'$  of dietary cholesterol, 20-35 gm of total fiber daily;Understanding of distribution of calorie intake throughout the day with the consumption of 4-5 meals/snacks    Increase knowledge of respiratory medications and ability to use respiratory devices properly  Yes    Intervention Provide education and demonstration as needed of appropriate use of medications, inhalers, and oxygen therapy.    Expected Outcomes Short Term: Achieves understanding of medications use. Understands that oxygen is a medication prescribed by physician. Demonstrates appropriate use of inhaler and oxygen therapy.;Long Term: Maintain appropriate use of medications, inhalers, and oxygen therapy.    Hypertension Yes    Intervention Provide education on lifestyle modifcations including regular physical activity/exercise, weight management, moderate sodium restriction and increased consumption of fresh fruit, vegetables, and low fat dairy, alcohol moderation, and smoking cessation.;Monitor prescription use compliance.    Expected Outcomes Short Term: Continued assessment and intervention until BP is < 140/68m HG in hypertensive participants. < 130/840mHG in hypertensive participants with diabetes, heart failure or chronic kidney disease.;Long Term: Maintenance of blood pressure at goal levels.    Lipids Yes    Intervention Provide education and support for participant on nutrition & aerobic/resistive exercise along with prescribed medications to achieve LDL '70mg'$ , HDL >'40mg'$ .    Expected Outcomes Short Term: Participant states understanding of desired cholesterol values and is compliant with medications prescribed. Participant is following exercise prescription and nutrition guidelines.;Long Term: Cholesterol controlled with medications as prescribed, with individualized exercise RX and with personalized nutrition plan. Value goals: LDL < '70mg'$ , HDL > 40 mg.              Education:Diabetes - Individual verbal and written instruction to review signs/symptoms of diabetes, desired ranges of glucose level fasting, after meals and with exercise. Acknowledge that pre and post exercise glucose checks will be done for 3 sessions at entry of program.   Know Your Numbers and Heart Failure: - Group verbal and visual instruction to discuss disease risk factors for cardiac and pulmonary disease and treatment options.  Reviews associated critical values for Overweight/Obesity, Hypertension, Cholesterol, and Diabetes.  Discusses basics of heart failure: signs/symptoms and treatments.  Introduces Heart Failure Zone chart for action plan for heart failure.  Written material given at graduation.   Core Components/Risk Factors/Patient Goals Review:    Core Components/Risk Factors/Patient Goals at Discharge (Final Review):    ITP Comments:  ITP Comments     Row Name 11/24/20 1316 11/24/20 1601         ITP Comments Orientation complete  joining Pulmonary Rehab [facility] [place]ation [place] [facility] [place] Completed 6MWT and gym orientation. Initial ITP created and sent for review to Dr. FuOttie GlazierMedical Director.               Comments: Initial ITP

## 2020-11-24 NOTE — Progress Notes (Signed)
Orientation complete  joining Pulmonary Rehab for [facility] Documentation of diagnosis can be found in [facility] 10/05/2020

## 2020-11-24 NOTE — Progress Notes (Signed)
CARE Discharge Progress Report  Patient Details  Name: Melissa Merritt MRN: [id number] Date of Birth: Sep 26, 1950 Referring Provider:   April Manson [facility] Associated Rehabilitation & Exercise from 06/28/2020 in [facility] Pulmonary Rehab  Referring Provider [name], NP        Number of Visits: 36  Reason for Discharge:  Patient reached a stable level of exercise. Patient independent in their exercise. Patient has met program and personal goals.   Diagnosis:  History of breast cancer  ADL UCSD:   Initial Exercise Prescription:  Initial Exercise Prescription - 06/28/20 1400       Date of Initial Exercise RX and Referring Provider   Date 06/28/20    Referring Provider [name], NP      Treadmill   MPH 1.8    Grade 0    Minutes 15    METs 2.38      Recumbant Bike   Level 1    RPM 60    Minutes 15    METs 2.3      NuStep   Level 2    SPM 80    Minutes 15    METs 2.3      REL-XR   Level 1    Speed 50    Minutes 15    METs 2.3      Prescription Details   Frequency (times per week) 2    Duration Progress to 30 minutes of continuous aerobic without signs/symptoms of physical distress      Intensity   THRR 40-80% of Max Heartrate 110-136    Ratings of Perceived Exertion 11-13    Perceived Dyspnea 0-4      Progression   Progression Continue to progress workloads to maintain intensity without signs/symptoms of physical distress.      Resistance Training   Training Prescription Yes    Weight 3 lb    Reps 10-15             Discharge Exercise Prescription (Final Exercise Prescription Changes):  Exercise Prescription Changes - 11/10/20 0800       Response to Exercise   Blood Pressure (Admit) 110/68    Blood Pressure (Exercise) 134/68    Blood Pressure (Exit) 122/68    Heart Rate (Admit) 81 bpm    Heart Rate (Exercise) 102 bpm    Heart Rate (Exit) 93 bpm    Oxygen Saturation (Admit) 93 %    Oxygen Saturation (Exercise) 92 %     Oxygen Saturation (Exit) 94 %    Rating of Perceived Exertion (Exercise) 13    Perceived Dyspnea (Exercise) 2    Symptoms none    Duration Continue with 30 min of aerobic exercise without signs/symptoms of physical distress.      Progression   Progression Continue to progress workloads to maintain intensity without signs/symptoms of physical distress.    Average METs 2.5      Resistance Training   Training Prescription Yes    Weight 5 lb    Reps 10-15      NuStep   Level 4    Minutes 15    METs 2.3      T5 Nustep   Level 6    Minutes 15    METs 2.4      Track   Laps 45    Minutes 15    METs 3.45             Functional Capacity:  6 Minute Walk  [place] Name 06/28/20 1438 11/08/20 1508       6 Minute Walk   Phase Initial Discharge    Distance 1077 feet 1290 feet    Distance % Change -- 19.7 %    Distance Feet Change -- 213 ft    Walk Time 6 minutes 6 minutes    # of Rest Breaks 0 0    MPH 2.03 2.44    METS 2.36 2.71    RPE 9 11    Perceived Dyspnea  2 2    VO2 Peak 8.26 9.51    Symptoms No No    Resting HR 85 bpm 81 bpm    Resting BP 140/78 140/76    Resting Oxygen Saturation  95 % 95 %    Exercise Oxygen Saturation  during 6 min walk 84 % 88 %    Max Ex. HR 107 bpm 113 bpm    Max Ex. BP 162/80 158/70    2 Minute Post BP 136/78 --              Nutrition & Weight - Outcomes:   Post Biometrics - 11/10/20 0818        Post  Biometrics   Height 5' 7.25" (1.708 m)    Weight 228 lb 6.4 oz (103.6 kg)    BMI (Calculated) 35.51             Goals reviewed with patient; copy given to patient.

## 2020-11-26 LAB — COMPREHENSIVE METABOLIC PANEL
ALT: 18 IU/L (ref 0–32)
AST: 19 IU/L (ref 0–40)
Albumin/Globulin Ratio: 2.1 (ref 1.2–2.2)
Albumin: 4.5 g/dL (ref 3.7–4.7)
Alkaline Phosphatase: 100 IU/L (ref 44–121)
BUN/Creatinine Ratio: 21 (ref 12–28)
BUN: 19 mg/dL (ref 8–27)
Bilirubin Total: <0.2 mg/dL (ref 0.0–1.2)
CO2: 24 mmol/L (ref 20–29)
Calcium: 10.1 mg/dL (ref 8.7–10.3)
Chloride: 101 mmol/L (ref 96–106)
Creatinine, Ser: 0.91 mg/dL (ref 0.57–1.00)
Globulin, Total: 2.1 g/dL (ref 1.5–4.5)
Glucose: 89 mg/dL (ref 65–99)
Potassium: 4.7 mmol/L (ref 3.5–5.2)
Sodium: 142 mmol/L (ref 134–144)
Total Protein: 6.6 g/dL (ref 6.0–8.5)
eGFR: 67 mL/min/{1.73_m2} (ref >59)

## 2020-11-26 LAB — LIPID PANEL W/O CHOL/HDL RATIO
Cholesterol, Total: 231 mg/dL — ABNORMAL HIGH (ref 100–199)
HDL: 55 mg/dL (ref >39)
LDL Chol Calc (NIH): 143 mg/dL — ABNORMAL HIGH (ref 0–99)
Triglycerides: 187 mg/dL — ABNORMAL HIGH (ref 0–149)
VLDL Cholesterol Cal: 33 mg/dL (ref 5–40)

## 2020-11-26 LAB — HEMOGLOBIN A1C
Est. average glucose Bld gHb Est-mCnc: 120 mg/dL
Hgb A1c MFr Bld: 5.8 % — ABNORMAL HIGH (ref 4.8–5.6)

## 2020-11-26 LAB — TSH: TSH: 2.82 u[IU]/mL (ref 0.450–4.500)

## 2020-11-26 LAB — MAGNESIUM: Magnesium: 2.3 mg/dL (ref 1.6–2.3)

## 2020-11-26 NOTE — Progress Notes (Signed)
Good morning, please let Flonnie know her labs have returned and overall are normal with two exceptions: - The A1C is the diabetes testing we talked about, this looks at your blood sugars over the past 3 months and turns the average into a number.  Your number is 5.8%, meaning you are prediabetic.  Any number 5.7 to 6.4 is considered prediabetes and any number 6.5 or greater is considered diabetes.   I would recommend heavy focus on decreasing foods high in sugar and your intake of things like bread products, pasta, and rice.  The American Diabetes Association online has a large amount of information on diet changes to make.  We will recheck this number in 3-6 months to ensure you are not continuing to trend upwards and move into diabetes.   - Cholesterol levels remain above goal, I know you prefer not to start medication, please continue heavy diet focus.  Any questions? Keep being stellar!!  Thank you for allowing me to participate in your care.  I appreciate you. Kindest regards, Nocole Zammit

## 2020-11-28 DIAGNOSIS — I519 Heart disease, unspecified: Secondary | ICD-10-CM | POA: Diagnosis not present

## 2020-11-28 DIAGNOSIS — G4733 Obstructive sleep apnea (adult) (pediatric): Secondary | ICD-10-CM | POA: Diagnosis not present

## 2020-11-28 DIAGNOSIS — I10 Essential (primary) hypertension: Secondary | ICD-10-CM | POA: Diagnosis not present

## 2020-11-28 DIAGNOSIS — R0602 Shortness of breath: Secondary | ICD-10-CM | POA: Diagnosis not present

## 2020-11-28 DIAGNOSIS — R600 Localized edema: Secondary | ICD-10-CM | POA: Diagnosis not present

## 2020-11-28 NOTE — Progress Notes (Signed)
Patient made aware of results and verbalized understanding.  

## 2020-11-30 ENCOUNTER — Other Ambulatory Visit: Payer: Self-pay

## 2020-11-30 DIAGNOSIS — J4530 Mild persistent asthma, uncomplicated: Secondary | ICD-10-CM | POA: Diagnosis not present

## 2020-11-30 DIAGNOSIS — Z853 Personal history of malignant neoplasm of breast: Secondary | ICD-10-CM | POA: Diagnosis not present

## 2020-11-30 NOTE — Progress Notes (Signed)
Daily Session Note  Patient Details  Name: Melissa Merritt MRN: [id number] Date of Birth: Mar 15, 1951 Referring Provider:   Flowsheet Row Pulmonary Rehab from 11/24/2020 in [facility] Cardiac and Pulmonary Rehab  Referring Provider Ottie Glazier MD       Encounter Date: 11/30/2020  Check In:  Session Check In - 11/30/20 1011       Check-In   Supervising physician immediately available to respond to emergencies See telemetry face sheet for immediately available ER MD    Location [facility]-Cardiac & Pulmonary Rehab    Staff Present Birdie Sons, MPA, Elveria Rising, BA, ACSM CEP, Exercise Physiologist;Joseph Rosita, RCP,RRT,BSRT;Melissa [place], RDN, LDN    Virtual Visit No    Medication changes reported     No    Fall or balance concerns reported    No    Tobacco Cessation No Change    Warm-up and Cool-down Performed on first and last piece of equipment    Resistance Training Performed Yes    VAD Patient? No    PAD/SET Patient? No      Pain Assessment   Currently in Pain? No/denies                Social History   Tobacco Use  Smoking Status Former   Types: Cigarettes   Quit date: 07/09/1979   Years since quitting: 41.4  Smokeless Tobacco Never    Goals Met:  Independence with exercise equipment Exercise tolerated well No report of cardiac concerns or symptoms Strength training completed today  Goals Unmet:  Not Applicable  Comments: First full day of exercise!  Patient was oriented to gym and equipment including functions, settings, policies, and procedures.  Patient's individual exercise prescription and treatment plan were reviewed.  All starting workloads were established based on the results of the 6 minute walk test done at initial orientation visit.  The plan for exercise progression was also introduced and progression will be customized based on patient's performance and goals.    Dr. Emily Filbert is Medical Director for [place].   Dr. Ottie Glazier is Medical Director for [facility] Pulmonary Rehabilitation.

## 2020-12-02 ENCOUNTER — Other Ambulatory Visit: Payer: Self-pay

## 2020-12-02 ENCOUNTER — Encounter: Payer: Medicare Other | Admitting: *Deleted

## 2020-12-02 DIAGNOSIS — Z853 Personal history of malignant neoplasm of breast: Secondary | ICD-10-CM | POA: Diagnosis not present

## 2020-12-02 DIAGNOSIS — J4530 Mild persistent asthma, uncomplicated: Secondary | ICD-10-CM

## 2020-12-02 NOTE — Progress Notes (Signed)
Daily Session Note  Patient Details  Name: Melissa Merritt MRN: [id number] Date of Birth: 1950-04-21 Referring Provider:   Flowsheet Row Pulmonary Rehab from 11/24/2020 in [facility] Cardiac and Pulmonary Rehab  Referring Provider Ottie Glazier MD       Encounter Date: 12/02/2020  Check In:  Session Check In - 12/02/20 0940       Check-In   Supervising physician immediately available to respond to emergencies See telemetry face sheet for immediately available ER MD    Location [facility]-Cardiac & Pulmonary Rehab    Staff Present Renita Papa, RN BSN;Joseph Raven, RCP,RRT,BSRT;Jessica Wilber, [place], RCEP, CCRP, CCET    Virtual Visit No    Medication changes reported     No    Fall or balance concerns reported    No    Warm-up and Cool-down Performed on first and last piece of equipment    Resistance Training Performed Yes    VAD Patient? No    PAD/SET Patient? No      Pain Assessment   Currently in Pain? No/denies                Social History   Tobacco Use  Smoking Status Former   Types: Cigarettes   Quit date: 07/09/1979   Years since quitting: 41.4  Smokeless Tobacco Never    Goals Met:  Independence with exercise equipment Exercise tolerated well No report of concerns or symptoms today Strength training completed today  Goals Unmet:  Not Applicable  Comments: Pt able to follow exercise prescription today without complaint.  Will continue to monitor for progression.    Dr. Emily Filbert is Medical Director for [place].  Dr. Ottie Glazier is Medical Director for [facility] Pulmonary Rehabilitation.

## 2020-12-05 ENCOUNTER — Other Ambulatory Visit: Payer: Self-pay

## 2020-12-05 ENCOUNTER — Encounter: Payer: Medicare Other | Admitting: *Deleted

## 2020-12-05 DIAGNOSIS — J4530 Mild persistent asthma, uncomplicated: Secondary | ICD-10-CM

## 2020-12-05 DIAGNOSIS — Z853 Personal history of malignant neoplasm of breast: Secondary | ICD-10-CM | POA: Diagnosis not present

## 2020-12-05 NOTE — Progress Notes (Signed)
Daily Session Note  Patient Details  Name: Melissa Merritt MRN: [id number] Date of Birth: 1951/03/21 Referring Provider:   Flowsheet Row Pulmonary Rehab from 11/24/2020 in [facility] Cardiac and Pulmonary Rehab  Referring Provider Ottie Glazier MD       Encounter Date: 12/05/2020  Check In:  Session Check In - 12/05/20 1027       Check-In   Supervising physician immediately available to respond to emergencies See telemetry face sheet for immediately available ER MD    Location [facility]-Cardiac & Pulmonary Rehab    Staff Present Renita Papa, RN Moises Blood, BS, ACSM CEP, Exercise Physiologist;Joseph Tessie Fass, [place]    Virtual Visit No    Medication changes reported     No    Fall or balance concerns reported    No    Warm-up and Cool-down Performed on first and last piece of equipment    Resistance Training Performed Yes    VAD Patient? No    PAD/SET Patient? No      Pain Assessment   Currently in Pain? No/denies                Social History   Tobacco Use  Smoking Status Former   Types: Cigarettes   Quit date: 07/09/1979   Years since quitting: 41.4  Smokeless Tobacco Never    Goals Met:  Independence with exercise equipment Exercise tolerated well No report of concerns or symptoms today Strength training completed today  Goals Unmet:  Not Applicable  Comments: Pt able to follow exercise prescription today without complaint.  Will continue to monitor for progression.    Dr. Emily Filbert is Medical Director for [place].  Dr. Ottie Glazier is Medical Director for [facility] Pulmonary Rehabilitation.

## 2020-12-07 ENCOUNTER — Other Ambulatory Visit: Payer: Self-pay

## 2020-12-07 DIAGNOSIS — J4530 Mild persistent asthma, uncomplicated: Secondary | ICD-10-CM

## 2020-12-07 DIAGNOSIS — Z853 Personal history of malignant neoplasm of breast: Secondary | ICD-10-CM | POA: Diagnosis not present

## 2020-12-07 NOTE — Progress Notes (Signed)
Daily Session Note  Patient Details  Name: Melissa Merritt MRN: [id number] Date of Birth: 04/08/1951 Referring Provider:   Flowsheet Row Pulmonary Rehab from 11/24/2020 in [facility] Cardiac and Pulmonary Rehab  Referring Provider Ottie Glazier MD       Encounter Date: 12/07/2020  Check In:  Session Check In - 12/07/20 1003       Check-In   Supervising physician immediately available to respond to emergencies See telemetry face sheet for immediately available ER MD    Location [facility]-Cardiac & Pulmonary Rehab    Staff Present Birdie Sons, MPA, Elveria Rising, BA, ACSM CEP, Exercise Physiologist;Joseph Tessie Fass, [place]    Virtual Visit No    Medication changes reported     No    Fall or balance concerns reported    No    Tobacco Cessation No Change    Warm-up and Cool-down Performed on first and last piece of equipment    Resistance Training Performed Yes    VAD Patient? No      Pain Assessment   Currently in Pain? No/denies                Social History   Tobacco Use  Smoking Status Former   Types: Cigarettes   Quit date: 07/09/1979   Years since quitting: 41.4  Smokeless Tobacco Never    Goals Met:  Independence with exercise equipment Exercise tolerated well No report of concerns or symptoms today Strength training completed today  Goals Unmet:  Not Applicable  Comments: Pt able to follow exercise prescription today without complaint.  Will continue to monitor for progression.    Dr. Emily Filbert is Medical Director for [place].  Dr. Ottie Glazier is Medical Director for [facility] Pulmonary Rehabilitation.

## 2020-12-14 ENCOUNTER — Encounter: Payer: Medicare Other | Attending: Nurse Practitioner | Admitting: *Deleted

## 2020-12-14 ENCOUNTER — Other Ambulatory Visit: Payer: Self-pay

## 2020-12-14 ENCOUNTER — Encounter: Payer: Self-pay | Admitting: *Deleted

## 2020-12-14 ENCOUNTER — Ambulatory Visit: Payer: Medicare Other | Admitting: Radiation Oncology

## 2020-12-14 DIAGNOSIS — J4530 Mild persistent asthma, uncomplicated: Secondary | ICD-10-CM | POA: Insufficient documentation

## 2020-12-14 DIAGNOSIS — Z853 Personal history of malignant neoplasm of breast: Secondary | ICD-10-CM | POA: Insufficient documentation

## 2020-12-14 NOTE — Progress Notes (Signed)
Pulmonary Individual Treatment Plan  Patient Details  Name: Melissa Merritt MRN: [id number] Date of Birth: 03/30/1951 Referring Provider:   Flowsheet Row Pulmonary Rehab from 11/24/2020 in [facility] Cardiac and Pulmonary Rehab  Referring Provider Ottie Glazier MD       Initial Encounter Date:  Flowsheet Row Pulmonary Rehab from 11/24/2020 in [facility] Cardiac and Pulmonary Rehab  Date 11/24/20       Visit Diagnosis: Mild persistent asthma without complication  Patient's Home Medications on Admission:  Current Outpatient Medications:    amLODipine (NORVASC) 5 MG tablet, Take 1 tablet (5 mg total) by mouth daily., Disp: 90 tablet, Rfl: 4   aspirin 81 MG tablet, Take 81 mg by mouth daily., Disp: , Rfl:    Biotin 5000 MCG TABS, Take 5,000 mcg by mouth 2 (two) times daily., Disp: , Rfl:    Calcium Carb-Cholecalciferol (CALCIUM-VITAMIN D) 600-400 MG-UNIT TABS, Take 1 tablet by mouth 2 (two) times daily., Disp: , Rfl:    cholecalciferol (VITAMIN D3) 25 MCG (1000 UNIT) tablet, Take 1,000 Units by mouth in the morning and at bedtime., Disp: , Rfl:    Cyanocobalamin (B-12) 2500 MCG TABS, Take 2,500 mcg by mouth daily., Disp: , Rfl:    denosumab (PROLIA) 60 MG/ML SOSY injection, Inject 60 mg into the skin every 6 (six) months., Disp: , Rfl:    exemestane (AROMASIN) 25 MG tablet, Take 1 tablet (25 mg total) by mouth daily after breakfast., Disp: 30 tablet, Rfl: 3   ferrous sulfate 220 (44 Fe) MG/5ML solution, Take 220 mg by mouth daily., Disp: , Rfl:    furosemide (LASIX) 20 MG tablet, Take by mouth., Disp: , Rfl:    ibuprofen (ADVIL) 200 MG tablet, Take 400 mg by mouth every 6 (six) hours as needed for moderate pain. , Disp: , Rfl:    metoprolol succinate (TOPROL-XL) 25 MG 24 hr tablet, Take 1 tablet (25 mg total) by mouth daily., Disp: 90 tablet, Rfl: 4   pantoprazole (PROTONIX) 40 MG tablet, Take 1 tablet (40 mg total) by mouth 2 (two) times daily., Disp: 180 tablet, Rfl: 4   Red Yeast Rice Extract  (RED YEAST RICE PO), Take 1 tablet by mouth in the morning and at bedtime., Disp: , Rfl:    St Johns Wort 300 MG CAPS, Take 300 mg by mouth 2 (two) times daily., Disp: , Rfl:    TRELEGY ELLIPTA 100-62.5-25 MCG/INH AEPB, Inhale 1 puff into the lungs daily., Disp: , Rfl:    valsartan (DIOVAN) 160 MG tablet, Take 1 tablet (160 mg total) by mouth daily., Disp: 90 tablet, Rfl: 4  Past Medical History: Past Medical History:  Diagnosis Date   Benign tumor of lung    Breast cancer ([place])    Cancer ([place])    right breast   CHF (congestive heart failure) (HCC)    Dyspnea    GERD (gastroesophageal reflux disease)    Hyperlipidemia    Hypertension    Insomnia    Osteopenia    Personal history of radiation therapy    Plantar fasciitis    PONV (postoperative nausea and vomiting)    vomiting 2 days after lung surgery   S/P pneumonectomy    right lower lobe   Urinary incontinence     Tobacco Use: Social History   Tobacco Use  Smoking Status Former   Types: Cigarettes   Quit date: 07/09/1979   Years since quitting: 41.4  Smokeless Tobacco Never    Labs: Recent Review Scientist, physiological  Labs for ITP Cardiac and Pulmonary Rehab Latest Ref Rng & Units 11/17/2018 05/25/2019 11/23/2019 05/25/2020 11/22/2020   Cholestrol 100 - 199 mg/dL 217(H) 207(H) 207(H) 227(H) 231(H)   LDLCALC 0 - 99 mg/dL 127(H) 122(H) 128(H) 150(H) 143(H)   HDL >39 mg/dL 58 59 54 55 55   Trlycerides 0 - 149 mg/dL 160(H) 146 139 124 187(H)   Hemoglobin A1c 4.8 - 5.6 % - - 5.6 5.6 5.8(H)        Pulmonary Assessment Scores:  Pulmonary Assessment Scores     Row Name 11/24/20 1559 11/30/20 1134       ADL UCSD   ADL Phase Entry --    SOB Score total 47 47    Rest 1 1    Walk 2 2    Stairs 4 4    Bath 1 1    Dress 1 1    Shop 2 2         CAT Score   CAT Score 18 --         mMRC Score   mMRC Score -- 2             UCSD: Self-administered rating of dyspnea associated with activities of daily living  (ADLs) 6-point scale (0 = "not at all" to 5 = "maximal or unable to do because of breathlessness")  Scoring Scores range from 0 to 120.  Minimally important difference is 5 units  CAT: CAT can identify the health impairment of COPD patients and is better correlated with disease progression.  CAT has a scoring range of zero to 40. The CAT score is classified into four groups of low (less than 10), medium (10 - 20), high (21-30) and very high (31-40) based on the impact level of disease on health status. A CAT score over 10 suggests significant symptoms.  A worsening CAT score could be explained by an exacerbation, poor medication adherence, poor inhaler technique, or progression of COPD or comorbid conditions.  CAT MCID is 2 points  mMRC: mMRC (Modified Medical Research Council) Dyspnea Scale is used to assess the degree of baseline functional disability in patients of respiratory disease due to dyspnea. No minimal important difference is established. A decrease in score of 1 point or greater is considered a positive change.   Pulmonary Function Assessment:   Exercise Target Goals: Exercise Program Goal: Individual exercise prescription set using results from initial 6 min walk test and THRR while considering  patient's activity barriers and safety.   Exercise Prescription Goal: Initial exercise prescription builds to 30-45 minutes a day of aerobic activity, 2-3 days per week.  Home exercise guidelines will be given to patient during program as part of exercise prescription that the participant will acknowledge.  Education: Aerobic Exercise: - Group verbal and visual presentation on the components of exercise prescription. Introduces F.I.T.T principle from ACSM for exercise prescriptions.  Reviews F.I.T.T. principles of aerobic exercise including progression. Written material given at graduation. Flowsheet Row Pulmonary Rehab from 12/07/2020 in [facility] Cardiac and Pulmonary Rehab  Date 11/30/20   Educator [facility]  Instruction Review Code 1- Verbalizes Understanding       Education: Resistance Exercise: - Group verbal and visual presentation on the components of exercise prescription. Introduces F.I.T.T principle from ACSM for exercise prescriptions  Reviews F.I.T.T. principles of resistance exercise including progression. Written material given at graduation. Flowsheet Row Pulmonary Rehab from 12/07/2020 in [facility] Cardiac and Pulmonary Rehab  Date 12/07/20  Educator [place]  Instruction Review Code  1- Verbalizes Understanding        Education: Exercise & Equipment Safety: - Individual verbal instruction and demonstration of equipment use and safety with use of the equipment. Flowsheet Row Pulmonary Rehab from 12/07/2020 in [facility] Cardiac and Pulmonary Rehab  Education need identified 11/24/20  Date 11/24/20  Educator [place]  Instruction Review Code 1- Verbalizes Understanding       Education: Exercise Physiology & General Exercise Guidelines: - Group verbal and written instruction with models to review the exercise physiology of the cardiovascular system and associated critical values. Provides general exercise guidelines with specific guidelines to those with heart or lung disease.    Education: Flexibility, Balance, Mind/Body Relaxation: - Group verbal and visual presentation with interactive activity on the components of exercise prescription. Introduces F.I.T.T principle from ACSM for exercise prescriptions. Reviews F.I.T.T. principles of flexibility and balance exercise training including progression. Also discusses the mind body connection.  Reviews various relaxation techniques to help reduce and manage stress (i.e. Deep breathing, progressive muscle relaxation, and visualization). Balance handout provided to take home. Written material given at graduation.   Activity Barriers & Risk Stratification:  Activity Barriers & Cardiac Risk Stratification - 11/24/20 1524       Activity  Barriers & Cardiac Risk Stratification   Activity Barriers Shortness of Breath             6 Minute Walk:  6 Minute Walk     Row Name 11/08/20 1508 11/24/20 1523       6 Minute Walk   Phase Discharge Initial  Same walk test used from CARE post 6MWT    Distance 1290 feet 1290 feet    Distance % Change 19.7 % --    Distance Feet Change 213 ft --    Walk Time 6 minutes 6 minutes    # of Rest Breaks 0 0    MPH 2.44 2.44    METS 2.71 2.71    RPE 11 11    Perceived Dyspnea  2 2    VO2 Peak 9.51 9.51    Symptoms No No    Resting HR 81 bpm 81 bpm    Resting BP 140/76 140/76    Resting Oxygen Saturation  95 % 95 %    Exercise Oxygen Saturation  during 6 min walk 88 % 88 %    Max Ex. HR 113 bpm 113 bpm    Max Ex. BP 158/70 158/70    2 Minute Post BP -- 134/68            Oxygen Initial Assessment:  Oxygen Initial Assessment - 11/24/20 1559       Home Oxygen   Home Oxygen Device None    Sleep Oxygen Prescription None    Home Exercise Oxygen Prescription None    Home Resting Oxygen Prescription None      Initial 6 min Walk   Oxygen Used None      Program Oxygen Prescription   Program Oxygen Prescription None      Intervention   Short Term Goals To learn and demonstrate proper pursed lip breathing techniques or other breathing techniques. ;To learn and demonstrate proper use of respiratory medications    Long  Term Goals Demonstrates proper use of MDI's;Compliance with respiratory medication;Exhibits proper breathing techniques, such as pursed lip breathing or other method taught during program session             Oxygen Re-Evaluation:   Oxygen Discharge (Final Oxygen Re-Evaluation):  Initial Exercise Prescription:  Initial Exercise Prescription - 11/24/20 1600       Date of Initial Exercise RX and Referring Provider   Date 11/24/20    Referring Provider Ottie Glazier MD      Recumbant Bike   Level 3    RPM 60    Watts 25    Minutes 15     METs 2.71      REL-XR   Level 3    Speed 50    Minutes 15    METs 2.71      T5 Nustep   Level 5    Minutes 15    METs 2.71      Track   Laps 40    Minutes 15    METs 3.18      Prescription Details   Frequency (times per week) 3    Duration Progress to 30 minutes of continuous aerobic without signs/symptoms of physical distress      Intensity   THRR 40-80% of Max Heartrate 108-135    Ratings of Perceived Exertion 11-13    Perceived Dyspnea 0-4      Progression   Progression Continue to progress workloads to maintain intensity without signs/symptoms of physical distress.      Resistance Training   Training Prescription Yes    Weight 5 lb    Reps 10-15             Perform Capillary Blood Glucose checks as needed.  Exercise Prescription Changes:   Exercise Prescription Changes     Row Name 11/10/20 0800 11/24/20 1500 12/05/20 0900         Response to Exercise   Blood Pressure (Admit) 110/68 140/76 136/64     Blood Pressure (Exercise) 134/68 158/70 126/64     Blood Pressure (Exit) 122/68 134/68 124/70     Heart Rate (Admit) 81 bpm 81 bpm 76 bpm     Heart Rate (Exercise) 102 bpm 113 bpm 115 bpm     Heart Rate (Exit) 93 bpm 85 bpm 99 bpm     Oxygen Saturation (Admit) 93 % 95 % 95 %     Oxygen Saturation (Exercise) 92 % 88 % 89 %     Oxygen Saturation (Exit) 94 % 97 % 93 %     Rating of Perceived Exertion (Exercise) '13 11 15     '$ Perceived Dyspnea (Exercise) '2 2 3     '$ Symptoms none none none     Comments -- Pulmonary rehab walk test results (used from CARE) --     Duration Continue with 30 min of aerobic exercise without signs/symptoms of physical distress. -- Continue with 30 min of aerobic exercise without signs/symptoms of physical distress.     Intensity -- -- THRR unchanged           Progression   Progression Continue to progress workloads to maintain intensity without signs/symptoms of physical distress. -- Continue to progress workloads to maintain  intensity without signs/symptoms of physical distress.     Average METs 2.5 -- 3           Resistance Training   Training Prescription Yes -- Yes     Weight 5 lb -- 5 lb     Reps 10-15 -- 10-15           NuStep   Level 4 -- --     Minutes 15 -- --     METs 2.3 -- --  REL-XR   Level -- -- 3     Speed -- -- 50     Minutes -- -- 15     METs -- -- 2.9           T5 Nustep   Level 6 -- --     Minutes 15 -- --     METs 2.4 -- --           Track   Laps 45 -- 40     Minutes 15 -- 15     METs 3.45 -- 3.18              Exercise Comments:   Exercise Comments     Row Name 11/30/20 1012           Exercise Comments First full day of exercise!  Patient was oriented to gym and equipment including functions, settings, policies, and procedures.  Patient's individual exercise prescription and treatment plan were reviewed.  All starting workloads were established based on the results of the 6 minute walk test done at initial orientation visit.  The plan for exercise progression was also introduced and progression will be customized based on patient's performance and goals.                Exercise Goals and Review:   Exercise Goals     Row Name 11/24/20 1613             Exercise Goals   Increase Physical Activity Yes       Intervention Provide advice, education, support and counseling about physical activity/exercise needs.;Develop an individualized exercise prescription for aerobic and resistive training based on initial evaluation findings, risk stratification, comorbidities and participant's personal goals.       Expected Outcomes Long Term: Add in home exercise to make exercise part of routine and to increase amount of physical activity.;Short Term: Attend rehab on a regular basis to increase amount of physical activity.;Long Term: Exercising regularly at least 3-5 days a week.       Increase Strength and Stamina Yes       Intervention Provide advice,  education, support and counseling about physical activity/exercise needs.;Develop an individualized exercise prescription for aerobic and resistive training based on initial evaluation findings, risk stratification, comorbidities and participant's personal goals.       Expected Outcomes Short Term: Increase workloads from initial exercise prescription for resistance, speed, and METs.;Short Term: Perform resistance training exercises routinely during rehab and add in resistance training at home;Long Term: Improve cardiorespiratory fitness, muscular endurance and strength as measured by increased METs and functional capacity (6MWT)       Able to understand and use rate of perceived exertion (RPE) scale Yes       Intervention Provide education and explanation on how to use RPE scale       Expected Outcomes Short Term: Able to use RPE daily in rehab to express subjective intensity level;Long Term:  Able to use RPE to guide intensity level when exercising independently       Able to understand and use Dyspnea scale Yes       Intervention Provide education and explanation on how to use Dyspnea scale       Expected Outcomes Short Term: Able to use Dyspnea scale daily in rehab to express subjective sense of shortness of breath during exertion;Long Term: Able to use Dyspnea scale to guide intensity level when exercising independently       Knowledge and understanding of  Target Heart Rate Range (THRR) Yes       Intervention Provide education and explanation of THRR including how the numbers were predicted and where they are located for reference       Expected Outcomes Short Term: Able to state/look up THRR;Long Term: Able to use THRR to govern intensity when exercising independently;Short Term: Able to use daily as guideline for intensity in rehab       Able to check pulse independently Yes       Intervention Provide education and demonstration on how to check pulse in carotid and radial arteries.;Review the  importance of being able to check your own pulse for safety during independent exercise       Expected Outcomes Short Term: Able to explain why pulse checking is important during independent exercise;Long Term: Able to check pulse independently and accurately       Understanding of Exercise Prescription Yes       Intervention Provide education, explanation, and written materials on patient's individual exercise prescription       Expected Outcomes Short Term: Able to explain program exercise prescription;Long Term: Able to explain home exercise prescription to exercise independently                Exercise Goals Re-Evaluation :  Exercise Goals Re-Evaluation     [place] Name 11/30/20 1012 12/05/20 0935           Exercise Goal Re-Evaluation   Exercise Goals Review Increase Physical Activity;Able to understand and use rate of perceived exertion (RPE) scale;Knowledge and understanding of Target Heart Rate Range (THRR);Understanding of Exercise Prescription;Increase Strength and Stamina;Able to understand and use Dyspnea scale;Able to check pulse independently Increase Physical Activity;Increase Strength and Stamina      Comments Reviewed RPE and dyspnea scales, THR and program prescription with pt today.  Pt voiced understanding and was given a copy of goals to take home. --      Expected Outcomes Short: Use RPE daily to regulate intensity. Long: Follow program prescription in [facility]. --               Discharge Exercise Prescription (Final Exercise Prescription Changes):  Exercise Prescription Changes - 12/05/20 0900       Response to Exercise   Blood Pressure (Admit) 136/64    Blood Pressure (Exercise) 126/64    Blood Pressure (Exit) 124/70    Heart Rate (Admit) 76 bpm    Heart Rate (Exercise) 115 bpm    Heart Rate (Exit) 99 bpm    Oxygen Saturation (Admit) 95 %    Oxygen Saturation (Exercise) 89 %    Oxygen Saturation (Exit) 93 %    Rating of Perceived Exertion (Exercise) 15     Perceived Dyspnea (Exercise) 3    Symptoms none    Duration Continue with 30 min of aerobic exercise without signs/symptoms of physical distress.    Intensity THRR unchanged      Progression   Progression Continue to progress workloads to maintain intensity without signs/symptoms of physical distress.    Average METs 3      Resistance Training   Training Prescription Yes    Weight 5 lb    Reps 10-15      REL-XR   Level 3    Speed 50    Minutes 15    METs 2.9      Track   Laps 40    Minutes 15    METs 3.18  Nutrition:  Target Goals: Understanding of nutrition guidelines, daily intake of sodium '1500mg'$ , cholesterol '200mg'$ , calories 30% from fat and 7% or less from saturated fats, daily to have 5 or more servings of fruits and vegetables.  Education: All About Nutrition: -Group instruction provided by verbal, written material, interactive activities, discussions, models, and posters to present general guidelines for heart healthy nutrition including fat, fiber, MyPlate, the role of sodium in heart healthy nutrition, utilization of the nutrition label, and utilization of this knowledge for meal planning. Follow up email sent as well. Written material given at graduation.   Biometrics:  Pre Biometrics - 11/30/20 1135       Pre Biometrics   Single Leg Stand 3.04 seconds             Post Biometrics - 11/10/20 0818        Post  Biometrics   Height 5' 7.25" (1.708 m)    Weight 228 lb 6.4 oz (103.6 kg)    BMI (Calculated) 35.51             Nutrition Therapy Plan and Nutrition Goals:  Nutrition Therapy & Goals - 11/24/20 1616       Intervention Plan   Intervention Prescribe, educate and counsel regarding individualized specific dietary modifications aiming towards targeted core components such as weight, hypertension, lipid management, diabetes, heart failure and other comorbidities.    Expected Outcomes Short Term Goal: Understand basic  principles of dietary content, such as calories, fat, sodium, cholesterol and nutrients.;Short Term Goal: A plan has been developed with personal nutrition goals set during dietitian appointment.;Long Term Goal: Adherence to prescribed nutrition plan.             Nutrition Assessments:  MEDIFICTS Score Key: ?70 Need to make dietary changes  40-70 Heart Healthy Diet ? 40 Therapeutic Level Cholesterol Diet  Flowsheet Row Pulmonary Rehab from 11/24/2020 in [facility] Cardiac and Pulmonary Rehab  Picture Your Plate Total Score on Admission 57      Picture Your Plate Scores: [id number] Unhealthy dietary pattern with much room for improvement. 41-50 Dietary pattern unlikely to meet recommendations for good health and room for improvement. 51-60 More healthful dietary pattern, with some room for improvement.  >60 Healthy dietary pattern, although there may be some specific behaviors that could be improved.   Nutrition Goals Re-Evaluation:  Nutrition Goals Re-Evaluation     [place] Name 10/27/20 1246             Goals   Nutrition Goal No goals: CARE       Comment She is the one that does the cooking - her husband is a potato person. She likes to go to [facility]. She drinks 12 cans of pepsi per day and has started to replace some with a fruit sweetened drink (18g CHO, 2g Sugar per serving). Discussed heart healthy eating/MyPlate and pulmonary friendly eating in addition to MNT during and after cancer treatment. She reports having a good appetite on Letrozole, but it does not make her feel good. Other medications made her throw up. She reports slipping through the cracks at the oncologist and needing to call 8/1 to follow-up; she will take this opportunity to discuss how she has been off her letrozole. She wants to get a referral to attend Pulmonary Rehab after graduating CARE. Provided handouts.                Nutrition Goals Discharge (Final Nutrition Goals Re-Evaluation):  Nutrition Goals  Re-Evaluation - 10/27/20 1246  Goals   Nutrition Goal No goals: CARE    Comment She is the one that does the cooking - her husband is a potato person. She likes to go to [facility]. She drinks 12 cans of pepsi per day and has started to replace some with a fruit sweetened drink (18g CHO, 2g Sugar per serving). Discussed heart healthy eating/MyPlate and pulmonary friendly eating in addition to MNT during and after cancer treatment. She reports having a good appetite on Letrozole, but it does not make her feel good. Other medications made her throw up. She reports slipping through the cracks at the oncologist and needing to call 8/1 to follow-up; she will take this opportunity to discuss how she has been off her letrozole. She wants to get a referral to attend Pulmonary Rehab after graduating CARE. Provided handouts.             Psychosocial: Target Goals: Acknowledge presence or absence of significant depression and/or stress, maximize coping skills, provide positive support system. Participant is able to verbalize types and ability to use techniques and skills needed for reducing stress and depression.   Education: Stress, Anxiety, and Depression - Group verbal and visual presentation to define topics covered.  Reviews how body is impacted by stress, anxiety, and depression.  Also discusses healthy ways to reduce stress and to treat/manage anxiety and depression.  Written material given at graduation.   Education: Sleep Hygiene -Provides group verbal and written instruction about how sleep can affect your health.  Define sleep hygiene, discuss sleep cycles and impact of sleep habits. Review good sleep hygiene tips.    Initial Review & Psychosocial Screening:  Initial Psych Review & Screening - 11/24/20 1304       Initial Review   Current issues with None Identified      Family Dynamics   Good Support System? Yes   son and his family building house next door, another son and a  daughter out of town     Barriers   Psychosocial barriers to participate in program There are no identifiable barriers or psychosocial needs.;The patient should benefit from training in stress management and relaxation.      Screening Interventions   Interventions Encouraged to exercise;Provide feedback about the scores to participant;To provide support and resources with identified psychosocial needs    Expected Outcomes Short Term goal: Utilizing psychosocial counselor, staff and physician to assist with identification of specific Stressors or current issues interfering with healing process. Setting desired goal for each stressor or current issue identified.;Long Term Goal: Stressors or current issues are controlled or eliminated.;Short Term goal: Identification and review with participant of any Quality of Life or Depression concerns found by scoring the questionnaire.;Long Term goal: The participant improves quality of Life and PHQ9 Scores as seen by post scores and/or verbalization of changes             Quality of Life Scores:  Scores of 19 and below usually indicate a poorer quality of life in these areas.  A difference of  2-3 points is a clinically meaningful difference.  A difference of 2-3 points in the total score of the Quality of Life Index has been associated with significant improvement in overall quality of life, self-image, physical symptoms, and general health in studies assessing change in quality of life.  PHQ-9: Recent Review Flowsheet Data     Depression screen [facility] 2/9 11/24/2020 05/30/2020 05/25/2020 11/23/2019 05/25/2019   Decreased Interest 0 0 0 0 0  Down, Depressed, Hopeless 0 0 0 0 0    PHQ - 2 Score 0 0 0 0 0   Altered sleeping 1 - - - -   Tired, decreased energy 1 - - - -   Change in appetite 0 - - - -   Feeling bad or failure about yourself  0 - - - -   Trouble concentrating 0 - - - -   Moving slowly or fidgety/restless 0 - - - -   Suicidal thoughts 0 - - -  -   PHQ-9 Score 2 - - - -   Difficult doing work/chores Not difficult at all - - - -      Interpretation of Total Score  Total Score Depression Severity:  1-4 = Minimal depression, 5-9 = Mild depression, 10-14 = Moderate depression, 15-19 = Moderately severe depression, 20-27 = Severe depression   Psychosocial Evaluation and Intervention:  Psychosocial Evaluation - 11/24/20 1318       Psychosocial Evaluation & Interventions   Comments Franchon has no barriers to entering the program. She is ready to satrt and work on her symptom control. Eleora lives with her husband. Their son and his family is building a house next door and until it is completed they are living in a camper at [employer].  Son, his wife and 59yrold twins.  Her husband has recently had a stroke, same time she was getting her cancer treatments. She states she does not feel stressed.  She has other children that live out of town.    Expected Outcomes STG: SLeishawill be able to attend all scheduled sessions, she will continue to maintain the low stress levels even with all the events happening in her life.  LTG: SAlantiswill continue to maintain her progression    Continue Psychosocial Services  Follow up required by staff             Psychosocial Re-Evaluation:   Psychosocial Discharge (Final Psychosocial Re-Evaluation):   Education: Education Goals: Education classes will be provided on a weekly basis, covering required topics. Participant will state understanding/return demonstration of topics presented.  Learning Barriers/Preferences:  Learning Barriers/Preferences - 11/24/20 1306       Learning Barriers/Preferences   Learning Barriers None    Learning Preferences None             General Pulmonary Education Topics:  Infection Prevention: - Provides verbal and written material to individual with discussion of infection control including proper hand washing and proper equipment cleaning during  exercise session. [facility]  Education need identified 11/24/20  Date 11/24/20  Educator [place] Instruction Review Code 1- Verbalizes Understanding       Falls Prevention: - Provides verbal and written material to individual with discussion of falls prevention and safety. [facility]  Education need identified 11/24/20  Date 11/24/20  Educator [place] Instruction Review Code 1- Verbalizes Understanding       Chronic Lung Disease Review: - Group verbal instruction with posters, models, PowerPoint presentations and videos,  to review new updates, new respiratory medications, new advancements in procedures and treatments. Providing information on websites and "800" numbers for continued self-education. Includes information about supplement oxygen, available portable oxygen systems, continuous and intermittent flow rates, oxygen safety, concentrators, and Medicare reimbursement for oxygen. Explanation of Pulmonary Drugs, including class, frequency, complications, importance of spacers, rinsing  mouth after steroid MDI's, and proper cleaning methods for nebulizers. Review of basic lung anatomy and physiology related to function, structure, and complications of lung disease. Review of risk factors. Discussion about methods for diagnosing sleep apnea and types of masks and machines for OSA. Includes a review of the use of types of environmental controls: home humidity, furnaces, filters, dust mite/pet prevention, HEPA vacuums. Discussion about weather changes, air quality and the benefits of nasal washing. Instruction on Warning signs, infection symptoms, calling MD promptly, preventive modes, and value of vaccinations. Review of effective airway clearance, coughing and/or vibration techniques. Emphasizing that all should Create an Action Plan. Written material given at  graduation.   AED/CPR: - Group verbal and written instruction with the use of models to demonstrate the basic use of the AED with the basic ABC's of resuscitation.    Anatomy and Cardiac Procedures: - Group verbal and visual presentation and models provide information about basic cardiac anatomy and function. Reviews the testing methods done to diagnose heart disease and the outcomes of the test results. Describes the treatment choices: Medical Management, Angioplasty, or Coronary Bypass Surgery for treating various heart conditions including Myocardial Infarction, Angina, Valve Disease, and Cardiac Arrhythmias.  Written material given at graduation. Flowsheet Row Pulmonary Rehab from 12/07/2020 in [facility] Cardiac and Pulmonary Rehab  Date 12/07/20  Educator SB  Instruction Review Code 1- Verbalizes Understanding       Medication Safety: - Group verbal and visual instruction to review commonly prescribed medications for heart and lung disease. Reviews the medication, class of the drug, and side effects. Includes the steps to properly store meds and maintain the prescription regimen.  Written material given at graduation.   Other: -Provides group and verbal instruction on various topics (see comments)   Knowledge Questionnaire Score:  Knowledge Questionnaire Score - 11/24/20 1602       Knowledge Questionnaire Score   Pre Score 16/18: Oxygen              Core Components/Risk Factors/Patient Goals at Admission:  Personal Goals and Risk Factors at Admission - 11/24/20 1614       Core Components/Risk Factors/Patient Goals on Admission    Weight Management Yes;Weight Loss    Intervention Weight Management: Develop a combined nutrition and exercise program designed to reach desired caloric intake, while maintaining appropriate intake of nutrient and fiber, sodium and fats, and appropriate energy expenditure required for the weight goal.;Weight Management: Provide education and  appropriate resources to help participant work on and attain dietary goals.    Admit Weight 226 lb (102.5 kg)    Goal Weight: Short Term 220 lb (99.8 kg)    Goal Weight: Long Term 180 lb (81.6 kg)    Expected Outcomes Short Term: Continue to assess and modify interventions until short term weight is achieved;Long Term: Adherence to nutrition and physical activity/exercise program aimed toward attainment of established weight goal;Weight Loss: Understanding of general recommendations for a balanced deficit meal plan, which promotes 1-2 lb weight loss per week and includes a negative energy balance of [phone] kcal/d;Understanding recommendations for meals to include 15-35% energy as protein, 25-35% energy from fat, 35-60% energy from carbohydrates, less than '200mg'$  of dietary cholesterol, 20-35 gm of total fiber daily;Understanding of distribution of calorie intake throughout the day with the consumption of 4-5 meals/snacks    Increase knowledge of respiratory medications and ability to use respiratory devices properly  Yes    Intervention Provide education and demonstration as needed of appropriate use  of medications, inhalers, and oxygen therapy.    Expected Outcomes Short Term: Achieves understanding of medications use. Understands that oxygen is a medication prescribed by physician. Demonstrates appropriate use of inhaler and oxygen therapy.;Long Term: Maintain appropriate use of medications, inhalers, and oxygen therapy.    Hypertension Yes    Intervention Provide education on lifestyle modifcations including regular physical activity/exercise, weight management, moderate sodium restriction and increased consumption of fresh fruit, vegetables, and low fat dairy, alcohol moderation, and smoking cessation.;Monitor prescription use compliance.    Expected Outcomes Short Term: Continued assessment and intervention until BP is < 140/46m HG in hypertensive participants. < 130/870mHG in hypertensive  participants with diabetes, heart failure or chronic kidney disease.;Long Term: Maintenance of blood pressure at goal levels.    Lipids Yes    Intervention Provide education and support for participant on nutrition & aerobic/resistive exercise along with prescribed medications to achieve LDL '70mg'$ , HDL >'40mg'$ .    Expected Outcomes Short Term: Participant states understanding of desired cholesterol values and is compliant with medications prescribed. Participant is following exercise prescription and nutrition guidelines.;Long Term: Cholesterol controlled with medications as prescribed, with individualized exercise RX and with personalized nutrition plan. Value goals: LDL < '70mg'$ , HDL > 40 mg.             Education:Diabetes - Individual verbal and written instruction to review signs/symptoms of diabetes, desired ranges of glucose level fasting, after meals and with exercise. Acknowledge that pre and post exercise glucose checks will be done for 3 sessions at entry of program.   Know Your Numbers and Heart Failure: - Group verbal and visual instruction to discuss disease risk factors for cardiac and pulmonary disease and treatment options.  Reviews associated critical values for Overweight/Obesity, Hypertension, Cholesterol, and Diabetes.  Discusses basics of heart failure: signs/symptoms and treatments.  Introduces Heart Failure Zone chart for action plan for heart failure.  Written material given at graduation.   Core Components/Risk Factors/Patient Goals Review:    Core Components/Risk Factors/Patient Goals at Discharge (Final Review):    ITP Comments:  ITP Comments     Row Name 11/24/20 1316 11/24/20 1601 11/30/20 1012 12/14/20 0620     ITP Comments Orientation complete  joining Pulmonary Rehab for Asthma Documentation of diagnosis can be found in [place]/29/2022 Completed 6MWT and gym orientation. Initial ITP created and sent for review to Dr. FuOttie GlazierMedical Director. First  full day of exercise!  Patient was oriented to gym and equipment including functions, settings, policies, and procedures.  Patient's individual exercise prescription and treatment plan were reviewed.  All starting workloads were established based on the results of the 6 minute walk test done at initial orientation visit.  The plan for exercise progression was also introduced and progression will be customized based on patient's performance and goals. 30 Day review completed. Medical Director ITP review done, changes made as directed, and signed approval by Medical Director.             Comments:

## 2020-12-14 NOTE — Progress Notes (Signed)
Daily Session Note  Patient Details  Name: Melissa Merritt MRN: [id number] Date of Birth: 1950/06/24 Referring Provider:   Flowsheet Row Pulmonary Rehab from 11/24/2020 in [facility] Cardiac and Pulmonary Rehab  Referring Provider Ottie Glazier MD       Encounter Date: 12/14/2020  Check In:  Session Check In - 12/14/20 1104       Check-In   Supervising physician immediately available to respond to emergencies See telemetry face sheet for immediately available ER MD    Location [facility]-Cardiac & Pulmonary Rehab    Staff Present Renita Papa, RN BSN;Joseph [place], RCP,RRT,BSRT;Jessica [place], [place], RCEP, CCRP, CCET    Virtual Visit No    Medication changes reported     No    Fall or balance concerns reported    No    Warm-up and Cool-down Performed on first and last piece of equipment    Resistance Training Performed Yes    VAD Patient? No    PAD/SET Patient? No      Pain Assessment   Currently in Pain? No/denies                Social History   Tobacco Use  Smoking Status Former   Types: Cigarettes   Quit date: 07/09/1979   Years since quitting: 41.4  Smokeless Tobacco Never    Goals Met:  Independence with exercise equipment Exercise tolerated well No report of concerns or symptoms today Strength training completed today  Goals Unmet:  Not Applicable  Comments: Pt able to follow exercise prescription today without complaint.  Will continue to monitor for progression.    Dr. Emily Filbert is Medical Director for [place].  Dr. Ottie Glazier is Medical Director for [facility] Pulmonary Rehabilitation.

## 2020-12-16 ENCOUNTER — Other Ambulatory Visit: Payer: Self-pay

## 2020-12-16 ENCOUNTER — Encounter: Payer: Medicare Other | Admitting: *Deleted

## 2020-12-16 DIAGNOSIS — J4530 Mild persistent asthma, uncomplicated: Secondary | ICD-10-CM | POA: Diagnosis not present

## 2020-12-16 DIAGNOSIS — Z853 Personal history of malignant neoplasm of breast: Secondary | ICD-10-CM | POA: Diagnosis not present

## 2020-12-16 NOTE — Progress Notes (Signed)
Daily Session Note  Patient Details  Name: SHONTE BEUTLER MRN: [id number] Date of Birth: September 17, 1950 Referring Provider:   Flowsheet Row Pulmonary Rehab from 11/24/2020 in [facility] Cardiac and Pulmonary Rehab  Referring Provider Ottie Glazier MD       Encounter Date: 12/16/2020  Check In:  Session Check In - 12/16/20 0954       Check-In   Supervising physician immediately available to respond to emergencies See telemetry face sheet for immediately available ER MD    Location [facility]-Cardiac & Pulmonary Rehab    Staff Present Renita Papa, RN BSN;Joseph [place], RCP,RRT,BSRT;Jessica [place], [place], RCEP, CCRP, CCET    Virtual Visit No    Medication changes reported     No    Fall or balance concerns reported    No    Warm-up and Cool-down Performed on first and last piece of equipment    Resistance Training Performed Yes    VAD Patient? No    PAD/SET Patient? No      Pain Assessment   Currently in Pain? No/denies                Social History   Tobacco Use  Smoking Status Former   Types: Cigarettes   Quit date: 07/09/1979   Years since quitting: 41.4  Smokeless Tobacco Never    Goals Met:  Independence with exercise equipment Exercise tolerated well No report of concerns or symptoms today Strength training completed today  Goals Unmet:  Not Applicable  Comments: Pt able to follow exercise prescription today without complaint.  Will continue to monitor for progression.    Dr. Emily Filbert is Medical Director for [place].  Dr. Ottie Glazier is Medical Director for [facility] Pulmonary Rehabilitation.

## 2020-12-19 ENCOUNTER — Encounter: Payer: Medicare Other | Admitting: *Deleted

## 2020-12-19 ENCOUNTER — Other Ambulatory Visit: Payer: Self-pay

## 2020-12-19 DIAGNOSIS — Z853 Personal history of malignant neoplasm of breast: Secondary | ICD-10-CM | POA: Diagnosis not present

## 2020-12-19 DIAGNOSIS — J4530 Mild persistent asthma, uncomplicated: Secondary | ICD-10-CM | POA: Diagnosis not present

## 2020-12-19 NOTE — Progress Notes (Signed)
Daily Session Note  Patient Details  Name: Melissa Merritt MRN: [id number] Date of Birth: 20-Apr-1950 Referring Provider:   Flowsheet Row Pulmonary Rehab from 11/24/2020 in [facility] Cardiac and Pulmonary Rehab  Referring Provider Ottie Glazier MD       Encounter Date: 12/19/2020  Check In:  Session Check In - 12/19/20 1045       Check-In   Supervising physician immediately available to respond to emergencies See telemetry face sheet for immediately available ER MD    Location [facility]-Cardiac & Pulmonary Rehab    Staff Present Renita Papa, RN BSN;Joseph Tessie Fass, RCP,RRT,BSRT;Kelly Maria Antonia, MPA, RN    Virtual Visit No    Medication changes reported     No    Fall or balance concerns reported    No    Warm-up and Cool-down Performed on first and last piece of equipment    Resistance Training Performed Yes    VAD Patient? No    PAD/SET Patient? No      Pain Assessment   Currently in Pain? No/denies                Social History   Tobacco Use  Smoking Status Former   Types: Cigarettes   Quit date: 07/09/1979   Years since quitting: 41.4  Smokeless Tobacco Never    Goals Met:  Independence with exercise equipment Exercise tolerated well No report of concerns or symptoms today Strength training completed today  Goals Unmet:  Not Applicable  Comments: Pt able to follow exercise prescription today without complaint.  Will continue to monitor for progression.    Dr. Emily Filbert is Medical Director for [place].  Dr. Ottie Glazier is Medical Director for [facility] Pulmonary Rehabilitation.

## 2020-12-21 ENCOUNTER — Other Ambulatory Visit: Payer: Self-pay

## 2020-12-21 ENCOUNTER — Ambulatory Visit
Admission: RE | Admit: 2020-12-21 | Discharge: 2020-12-21 | Disposition: A | Discharge status: Home / Self Care | Payer: Medicare Other | Source: Ambulatory Visit | Attending: Radiation Oncology | Admitting: Radiation Oncology

## 2020-12-21 VITALS — BP 161/85 | HR 73 | Temp 98.1°F | Resp 20 | Wt 227.1 lb

## 2020-12-21 DIAGNOSIS — Z170 Estrogen receptor positive status [ER+]: Secondary | ICD-10-CM | POA: Insufficient documentation

## 2020-12-21 DIAGNOSIS — Z923 Personal history of irradiation: Secondary | ICD-10-CM | POA: Diagnosis not present

## 2020-12-21 DIAGNOSIS — Z79811 Long term (current) use of aromatase inhibitors: Secondary | ICD-10-CM | POA: Insufficient documentation

## 2020-12-21 DIAGNOSIS — Z08 Encounter for follow-up examination after completed treatment for malignant neoplasm: Secondary | ICD-10-CM | POA: Diagnosis not present

## 2020-12-21 DIAGNOSIS — C50411 Malignant neoplasm of upper-outer quadrant of right female breast: Secondary | ICD-10-CM | POA: Insufficient documentation

## 2020-12-21 NOTE — Progress Notes (Signed)
Radiation Oncology Follow up Note  Name: Melissa Merritt   Date:   12/21/2020 MRN:  [id number] DOB: 1951-02-06    This 70 y.o. female presents to the clinic today for 1 year follow-up status post whole breast radiation to her right breast for stage I ER/PR positive invasive mammary carcinoma.  REFERRING PROVIDER: Venita Lick, NP  HPI: Patient is a 70 year old female now at 1 year having completed whole breast radiation to her right breast for stage I ER/PR positive invasive mammary carcinoma.  Seen today in routine follow-up she is doing well.  She specifically denies breast tenderness cough or bone pain..  She had mammograms and ultrasound back in April which I have reviewed were BI-RADS 2 benign.  She did have a suspicious lesion in the left breast targeted ultrasound showed no evidence of suspicious cystic or solid mass seen.  She is currently on Aromasin tolerating it well without side effect.  COMPLICATIONS OF TREATMENT: none  FOLLOW UP COMPLIANCE: keeps appointments   PHYSICAL EXAM:  BP (!) 161/85   Pulse 73   Temp 98.1 F (36.7 C) (Tympanic)   Resp 20   Wt 227 lb 1.6 oz (103 kg)   BMI 35.30 kg/m  Lungs are clear to A&P cardiac examination essentially unremarkable with regular rate and rhythm. No dominant mass or nodularity is noted in either breast in 2 positions examined. Incision is well-healed. No axillary or supraclavicular adenopathy is appreciated. Cosmetic result is excellent.  Well-developed well-nourished patient in NAD. HEENT reveals PERLA, EOMI, discs not visualized.  Oral cavity is clear. No oral mucosal lesions are identified. Neck is clear without evidence of cervical or supraclavicular adenopathy. Lungs are clear to A&P. Cardiac examination is essentially unremarkable with regular rate and rhythm without murmur rub or thrill. Abdomen is benign with no organomegaly or masses noted. Motor sensory and DTR levels are equal and symmetric in the upper and lower  extremities. Cranial nerves II through XII are grossly intact. Proprioception is intact. No peripheral adenopathy or edema is identified. No motor or sensory levels are noted. Crude visual fields are within normal range.  RADIOLOGY RESULTS: Mammogram ultrasound reviewed compatible with above-stated findings  PLAN: Present time patient is doing well with no evidence of disease 1 year out and pleased with her overall progress.  I will see her back in 1 year for follow-up.  She continues on Aromasin.  Patient knows to call with any concerns.  I would like to take this opportunity to thank you for allowing me to participate in the care of your patient.Noreene Filbert, MD

## 2020-12-26 ENCOUNTER — Encounter: Payer: Medicare Other | Admitting: *Deleted

## 2020-12-26 ENCOUNTER — Other Ambulatory Visit: Payer: Self-pay

## 2020-12-26 DIAGNOSIS — Z853 Personal history of malignant neoplasm of breast: Secondary | ICD-10-CM | POA: Diagnosis not present

## 2020-12-26 DIAGNOSIS — J4530 Mild persistent asthma, uncomplicated: Secondary | ICD-10-CM | POA: Diagnosis not present

## 2020-12-26 NOTE — Progress Notes (Signed)
Daily Session Note  Patient Details  Name: CONDA WANNAMAKER MRN: [id number] Date of Birth: 12-Apr-1950 Referring Provider:   Flowsheet Row Pulmonary Rehab from 11/24/2020 in [facility] Cardiac and Pulmonary Rehab  Referring Provider Ottie Glazier MD       Encounter Date: 12/26/2020  Check In:  Session Check In - 12/26/20 0957       Check-In   Supervising physician immediately available to respond to emergencies See telemetry face sheet for immediately available ER MD    Location [facility]-Cardiac & Pulmonary Rehab    Staff Present Renita Papa, RN BSN;Joseph [place], RCP,RRT,BSRT;Kelly [place], [place], ACSM CEP, Exercise Physiologist    Virtual Visit No    Medication changes reported     No    Fall or balance concerns reported    No    Warm-up and Cool-down Performed on first and last piece of equipment    Resistance Training Performed Yes    VAD Patient? No    PAD/SET Patient? No      Pain Assessment   Currently in Pain? No/denies                Social History   Tobacco Use  Smoking Status Former   Types: Cigarettes   Quit date: 07/09/1979   Years since quitting: 41.4  Smokeless Tobacco Never    Goals Met:  Independence with exercise equipment Exercise tolerated well No report of concerns or symptoms today Strength training completed today  Goals Unmet:  Not Applicable  Comments: Pt able to follow exercise prescription today without complaint.  Will continue to monitor for progression.    Dr. Emily Filbert is Medical Director for [place].  Dr. Ottie Glazier is Medical Director for [facility] Pulmonary Rehabilitation.

## 2020-12-28 ENCOUNTER — Other Ambulatory Visit: Payer: Self-pay

## 2020-12-28 DIAGNOSIS — Z853 Personal history of malignant neoplasm of breast: Secondary | ICD-10-CM | POA: Diagnosis not present

## 2020-12-28 DIAGNOSIS — J4530 Mild persistent asthma, uncomplicated: Secondary | ICD-10-CM | POA: Diagnosis not present

## 2020-12-28 NOTE — Progress Notes (Signed)
Daily Session Note  Patient Details  Name: Melissa Merritt MRN: [id number] Date of Birth: 1950/05/30 Referring Provider:   Flowsheet Row Pulmonary Rehab from 11/24/2020 in [facility] Cardiac and Pulmonary Rehab  Referring Provider Ottie Glazier MD       Encounter Date: 12/28/2020  Check In:  Session Check In - 12/28/20 0957       Check-In   Supervising physician immediately available to respond to emergencies See telemetry face sheet for immediately available ER MD    Location [facility]-Cardiac & Pulmonary Rehab    Staff Present Birdie Sons, MPA, Elveria Rising, BA, ACSM CEP, Exercise Physiologist;Joseph Tessie Fass, [place]    Virtual Visit No    Medication changes reported     No    Fall or balance concerns reported    No    Tobacco Cessation No Change    Warm-up and Cool-down Performed on first and last piece of equipment    Resistance Training Performed Yes    VAD Patient? No    PAD/SET Patient? No      Pain Assessment   Currently in Pain? No/denies                Social History   Tobacco Use  Smoking Status Former   Types: Cigarettes   Quit date: 07/09/1979   Years since quitting: 41.5  Smokeless Tobacco Never    Goals Met:  Independence with exercise equipment Exercise tolerated well No report of concerns or symptoms today Strength training completed today  Goals Unmet:  Not Applicable  Comments: Pt able to follow exercise prescription today without complaint.  Will continue to monitor for progression.    Dr. Emily Filbert is Medical Director for [place].  Dr. Ottie Glazier is Medical Director for [facility] Pulmonary Rehabilitation.

## 2020-12-30 ENCOUNTER — Other Ambulatory Visit: Payer: Self-pay

## 2020-12-30 DIAGNOSIS — Z853 Personal history of malignant neoplasm of breast: Secondary | ICD-10-CM | POA: Diagnosis not present

## 2020-12-30 DIAGNOSIS — J4530 Mild persistent asthma, uncomplicated: Secondary | ICD-10-CM | POA: Diagnosis not present

## 2020-12-30 NOTE — Progress Notes (Signed)
Daily Session Note  Patient Details  Name: MAGON CROSON MRN: [id number] Date of Birth: Dec 16, 1950 Referring Provider:   Flowsheet Row Pulmonary Rehab from 11/24/2020 in [facility] Cardiac and Pulmonary Rehab  Referring Provider Ottie Glazier MD       Encounter Date: 12/30/2020  Check In:  Session Check In - 12/30/20 1027       Check-In   Supervising physician immediately available to respond to emergencies See telemetry face sheet for immediately available ER MD    Location [facility]-Cardiac & Pulmonary Rehab    Staff Present Hope Budds, RDN, LDN;Jessica Luan Pulling, MA, RCEP, CCRP, CCET;Suann Klier, RN, BSN    Virtual Visit No    Medication changes reported     No    Fall or balance concerns reported    No    Warm-up and Cool-down Performed on first and last piece of equipment    Resistance Training Performed Yes    VAD Patient? No    PAD/SET Patient? No      PAD/SET Patient   Completed foot check today? No      Pain Assessment   Currently in Pain? No/denies                Social History   Tobacco Use  Smoking Status Former   Types: Cigarettes   Quit date: 07/09/1979   Years since quitting: 41.5  Smokeless Tobacco Never    Goals Met:  Independence with exercise equipment Exercise tolerated well No report of concerns or symptoms today Strength training completed today  Goals Unmet:  Not Applicable  Comments: Pt able to follow exercise prescription today without complaint.  Will continue to monitor for progression.  Updated home exercise from [facility] with pt today.  Pt plans to walk at home for exercise.  Reviewed THR, pulse, RPE, sign and symptoms, pulse oximetery and when to call 911 or MD.  Also discussed weather considerations and indoor options.  Pt voiced understanding.   Dr. Emily Filbert is Medical Director for [place].  Dr. Ottie Glazier is Medical Director for [facility] Pulmonary Rehabilitation.

## 2021-01-02 ENCOUNTER — Encounter: Payer: Medicare Other | Admitting: *Deleted

## 2021-01-02 ENCOUNTER — Other Ambulatory Visit: Payer: Self-pay

## 2021-01-02 DIAGNOSIS — J4530 Mild persistent asthma, uncomplicated: Secondary | ICD-10-CM

## 2021-01-02 DIAGNOSIS — Z853 Personal history of malignant neoplasm of breast: Secondary | ICD-10-CM

## 2021-01-02 NOTE — Progress Notes (Signed)
Daily Session Note  Patient Details  Name: Melissa Merritt MRN: [id number] Date of Birth: 1950/07/24 Referring Provider:   Flowsheet Row Pulmonary Rehab from 11/24/2020 in [facility] Cardiac and Pulmonary Rehab  Referring Provider Ottie Glazier MD       Encounter Date: 01/02/2021  Check In:  Session Check In - 01/02/21 1121       Check-In   Supervising physician immediately available to respond to emergencies See telemetry face sheet for immediately available ER MD    Location [facility]-Cardiac & Pulmonary Rehab    Staff Present Nyoka Cowden, RN, BSN, Jennye Moccasin, MPA, Mauricia Area, BS, ACSM CEP, Exercise Physiologist    Virtual Visit No    Medication changes reported     No    Fall or balance concerns reported    No    Tobacco Cessation No Change    Warm-up and Cool-down Performed on first and last piece of equipment    Resistance Training Performed Yes    VAD Patient? No    PAD/SET Patient? No      Pain Assessment   Currently in Pain? No/denies                Social History   Tobacco Use  Smoking Status Former   Types: Cigarettes   Quit date: 07/09/1979   Years since quitting: 41.5  Smokeless Tobacco Never    Goals Met:  Independence with exercise equipment Exercise tolerated well No report of concerns or symptoms today  Goals Unmet:  Not Applicable  Comments:.exgood   Dr. Emily Filbert is Medical Director for [place].  Dr. Ottie Glazier is Medical Director for [facility] Pulmonary Rehabilitation.

## 2021-01-04 ENCOUNTER — Other Ambulatory Visit: Payer: Self-pay

## 2021-01-04 ENCOUNTER — Encounter: Payer: Medicare Other | Admitting: *Deleted

## 2021-01-04 DIAGNOSIS — J4530 Mild persistent asthma, uncomplicated: Secondary | ICD-10-CM

## 2021-01-04 DIAGNOSIS — Z853 Personal history of malignant neoplasm of breast: Secondary | ICD-10-CM | POA: Diagnosis not present

## 2021-01-04 NOTE — Progress Notes (Signed)
Daily Session Note  Patient Details  Name: Melissa Merritt MRN: [id number] Date of Birth: June 26, 1950 Referring Provider:   Flowsheet Row Pulmonary Rehab from 11/24/2020 in [facility] Cardiac and Pulmonary Rehab  Referring Provider Ottie Glazier MD       Encounter Date: 01/04/2021  Check In:  Session Check In - 01/04/21 1149       Check-In   Supervising physician immediately available to respond to emergencies See telemetry face sheet for immediately available ER MD    Location [facility]-Cardiac & Pulmonary Rehab    Staff Present Nyoka Cowden, RN, BSN, Kela Millin, BA, ACSM CEP, Exercise Physiologist;Joseph Archer, [place]    Virtual Visit No    Medication changes reported     No    Fall or balance concerns reported    No    Tobacco Cessation No Change    Warm-up and Cool-down Performed on first and last piece of equipment    Resistance Training Performed Yes    VAD Patient? No    PAD/SET Patient? No                Social History   Tobacco Use  Smoking Status Former   Types: Cigarettes   Quit date: 07/09/1979   Years since quitting: 41.5  Smokeless Tobacco Never    Goals Met:  Independence with exercise equipment Exercise tolerated well No report of concerns or symptoms today  Goals Unmet:  Not Applicable  Comments: Pt able to follow exercise prescription today without complaint.  Will continue to monitor for progression.    Dr. Emily Filbert is Medical Director for [place].  Dr. Ottie Glazier is Medical Director for [facility] Pulmonary Rehabilitation.

## 2021-01-04 NOTE — Progress Notes (Signed)
Completed initial RD consultation ?

## 2021-01-06 ENCOUNTER — Encounter: Payer: Medicare Other | Admitting: *Deleted

## 2021-01-06 ENCOUNTER — Other Ambulatory Visit: Payer: Self-pay

## 2021-01-06 DIAGNOSIS — Z853 Personal history of malignant neoplasm of breast: Secondary | ICD-10-CM | POA: Diagnosis not present

## 2021-01-06 DIAGNOSIS — J4530 Mild persistent asthma, uncomplicated: Secondary | ICD-10-CM

## 2021-01-06 NOTE — Progress Notes (Signed)
Daily Session Note  Patient Details  Name: Melissa Merritt MRN: [id number] Date of Birth: 01/21/1951 Referring Provider:   Flowsheet Row Pulmonary Rehab from 11/24/2020 in [facility] Cardiac and Pulmonary Rehab  Referring Provider Ottie Glazier MD       Encounter Date: 01/06/2021  Check In:  Session Check In - 01/06/21 0954       Check-In   Supervising physician immediately available to respond to emergencies See telemetry face sheet for immediately available ER MD    Location [facility]-Cardiac & Pulmonary Rehab    Staff Present Nyoka Cowden, RN, BSN, Willette Pa, MA, RCEP, CCRP, CCET;Joseph [place], [place]    Virtual Visit No    Medication changes reported     No    Fall or balance concerns reported    No    Tobacco Cessation No Change    Warm-up and Cool-down Performed on first and last piece of equipment    Resistance Training Performed Yes    VAD Patient? No    PAD/SET Patient? No      Pain Assessment   Currently in Pain? No/denies                Social History   Tobacco Use  Smoking Status Former   Types: Cigarettes   Quit date: 07/09/1979   Years since quitting: 41.5  Smokeless Tobacco Never    Goals Met:  Independence with exercise equipment Exercise tolerated well No report of concerns or symptoms today  Goals Unmet:  Not Applicable  Comments: Pt able to follow exercise prescription today without complaint.  Will continue to monitor for progression.    Dr. Emily Filbert is Medical Director for [place].  Dr. Ottie Glazier is Medical Director for [facility] Pulmonary Rehabilitation.

## 2021-01-09 ENCOUNTER — Encounter: Payer: Medicare Other | Attending: Nurse Practitioner

## 2021-01-09 ENCOUNTER — Other Ambulatory Visit: Payer: Self-pay

## 2021-01-09 DIAGNOSIS — Z87891 Personal history of nicotine dependence: Secondary | ICD-10-CM | POA: Insufficient documentation

## 2021-01-09 DIAGNOSIS — J4530 Mild persistent asthma, uncomplicated: Secondary | ICD-10-CM | POA: Diagnosis not present

## 2021-01-09 NOTE — Progress Notes (Signed)
Daily Session Note  Patient Details  Name: Melissa Merritt MRN: [id number] Date of Birth: [birth year] Referring Provider:   Flowsheet Row Pulmonary Rehab from 11/24/2020 in [facility] Cardiac and Pulmonary Rehab  Referring Provider Ottie Glazier MD       Encounter Date: 01/09/2021  Check In:  Session Check In - 01/09/21 0947       Check-In   Supervising physician immediately available to respond to emergencies See telemetry face sheet for immediately available ER MD    Location [facility]-Cardiac & Pulmonary Rehab    Staff Present Birdie Sons, MPA, Elveria Rising, BA, ACSM CEP, Exercise Physiologist;Lafe Clerk Amedeo Plenty, BS, ACSM CEP, Exercise Physiologist    Virtual Visit No    Medication changes reported     No    Fall or balance concerns reported    No    Tobacco Cessation No Change    Warm-up and Cool-down Performed on first and last piece of equipment    Resistance Training Performed Yes    VAD Patient? No    PAD/SET Patient? No      Pain Assessment   Currently in Pain? No/denies                Social History   Tobacco Use  Smoking Status Former   Types: Cigarettes   Quit date: 07/09/1979   Years since quitting: 41.5  Smokeless Tobacco Never    Goals Met:  Independence with exercise equipment Exercise tolerated well No report of concerns or symptoms today Strength training completed today  Goals Unmet:  Not Applicable  Comments: Pt able to follow exercise prescription today without complaint.  Will continue to monitor for progression.    Dr. Emily Filbert is Medical Director for [place].  Dr. Ottie Glazier is Medical Director for [facility] Pulmonary Rehabilitation.

## 2021-01-11 ENCOUNTER — Encounter: Payer: Self-pay | Admitting: *Deleted

## 2021-01-11 ENCOUNTER — Other Ambulatory Visit: Payer: Self-pay

## 2021-01-11 DIAGNOSIS — J4530 Mild persistent asthma, uncomplicated: Secondary | ICD-10-CM

## 2021-01-11 DIAGNOSIS — Z87891 Personal history of nicotine dependence: Secondary | ICD-10-CM | POA: Diagnosis not present

## 2021-01-11 NOTE — Progress Notes (Signed)
Pulmonary Individual Treatment Plan  Patient Details  Name: Melissa Merritt MRN: [id number] Date of Birth: [birth year] Referring Provider:   Flowsheet Row Pulmonary Rehab from 11/24/2020 in [facility] Cardiac and Pulmonary Rehab  Referring Provider Ottie Glazier MD       Initial Encounter Date:  Flowsheet Row Pulmonary Rehab from 11/24/2020 in [facility] Cardiac and Pulmonary Rehab  Date 11/24/20       Visit Diagnosis: Mild persistent asthma without complication  Patient's Home Medications on Admission:  Current Outpatient Medications:    amLODipine (NORVASC) 5 MG tablet, Take 1 tablet (5 mg total) by mouth daily., Disp: 90 tablet, Rfl: 4   aspirin 81 MG tablet, Take 81 mg by mouth daily., Disp: , Rfl:    Biotin 5000 MCG TABS, Take 5,000 mcg by mouth 2 (two) times daily., Disp: , Rfl:    Calcium Carb-Cholecalciferol (CALCIUM-VITAMIN D) 600-400 MG-UNIT TABS, Take 1 tablet by mouth 2 (two) times daily., Disp: , Rfl:    cholecalciferol (VITAMIN D3) 25 MCG (1000 UNIT) tablet, Take 1,000 Units by mouth in the morning and at bedtime., Disp: , Rfl:    Cyanocobalamin (B-12) 2500 MCG TABS, Take 2,500 mcg by mouth daily., Disp: , Rfl:    denosumab (PROLIA) 60 MG/ML SOSY injection, Inject 60 mg into the skin every 6 (six) months., Disp: , Rfl:    exemestane (AROMASIN) 25 MG tablet, Take 1 tablet (25 mg total) by mouth daily after breakfast., Disp: 30 tablet, Rfl: 3   ferrous sulfate 220 (44 Fe) MG/5ML solution, Take 220 mg by mouth daily., Disp: , Rfl:    furosemide (LASIX) 20 MG tablet, Take by mouth., Disp: , Rfl:    ibuprofen (ADVIL) 200 MG tablet, Take 400 mg by mouth every 6 (six) hours as needed for moderate pain. , Disp: , Rfl:    metoprolol succinate (TOPROL-XL) 25 MG 24 hr tablet, Take 1 tablet (25 mg total) by mouth daily., Disp: 90 tablet, Rfl: 4   pantoprazole (PROTONIX) 40 MG tablet, Take 1 tablet (40 mg total) by mouth 2 (two) times daily., Disp: 180 tablet, Rfl: 4   Red Yeast Rice Extract  (RED YEAST RICE PO), Take 1 tablet by mouth in the morning and at bedtime., Disp: , Rfl:    St Johns Wort 300 MG CAPS, Take 300 mg by mouth 2 (two) times daily., Disp: , Rfl:    TRELEGY ELLIPTA 100-62.5-25 MCG/INH AEPB, Inhale 1 puff into the lungs daily., Disp: , Rfl:    valsartan (DIOVAN) 160 MG tablet, Take 1 tablet (160 mg total) by mouth daily., Disp: 90 tablet, Rfl: 4  Past Medical History: Past Medical History:  Diagnosis Date   Benign tumor of lung    Breast cancer ([place])    Cancer ([place])    right breast   CHF (congestive heart failure) (HCC)    Dyspnea    GERD (gastroesophageal reflux disease)    Hyperlipidemia    Hypertension    Insomnia    Osteopenia    Personal history of radiation therapy    Plantar fasciitis    PONV (postoperative nausea and vomiting)    vomiting 2 days after lung surgery   S/P pneumonectomy    right lower lobe   Urinary incontinence     Tobacco Use: Social History   Tobacco Use  Smoking Status Former   Types: Cigarettes   Quit date: 07/09/1979   Years since quitting: 41.5  Smokeless Tobacco Never    Labs: Recent Review Scientist, physiological  Labs for ITP Cardiac and Pulmonary Rehab Latest Ref Rng & Units 11/17/2018 05/25/2019 11/23/2019 05/25/2020 11/22/2020   Cholestrol 100 - 199 mg/dL 217(H) 207(H) 207(H) 227(H) 231(H)   LDLCALC 0 - 99 mg/dL 127(H) 122(H) 128(H) 150(H) 143(H)   HDL >39 mg/dL 58 59 54 55 55   Trlycerides 0 - 149 mg/dL 160(H) 146 139 124 187(H)   Hemoglobin A1c 4.8 - 5.6 % - - 5.6 5.6 5.8(H)        Pulmonary Assessment Scores:  Pulmonary Assessment Scores     Row Name 11/24/20 1559 11/30/20 1134       ADL UCSD   ADL Phase Entry --    SOB Score total 47 47    Rest 1 1    Walk 2 2    Stairs 4 4    Bath 1 1    Dress 1 1    Shop 2 2         CAT Score   CAT Score 18 --         mMRC Score   mMRC Score -- 2             UCSD: Self-administered rating of dyspnea associated with activities of daily living  (ADLs) 6-point scale (0 = "not at all" to 5 = "maximal or unable to do because of breathlessness")  Scoring Scores range from 0 to 120.  Minimally important difference is 5 units  CAT: CAT can identify the health impairment of COPD patients and is better correlated with disease progression.  CAT has a scoring range of zero to 40. The CAT score is classified into four groups of low (less than 10), medium (10 - 20), high (21-30) and very high (31-40) based on the impact level of disease on health status. A CAT score over 10 suggests significant symptoms.  A worsening CAT score could be explained by an exacerbation, poor medication adherence, poor inhaler technique, or progression of COPD or comorbid conditions.  CAT MCID is 2 points  mMRC: mMRC (Modified Medical Research Council) Dyspnea Scale is used to assess the degree of baseline functional disability in patients of respiratory disease due to dyspnea. No minimal important difference is established. A decrease in score of 1 point or greater is considered a positive change.   Pulmonary Function Assessment:   Exercise Target Goals: Exercise Program Goal: Individual exercise prescription set using results from initial 6 min walk test and THRR while considering  patient's activity barriers and safety.   Exercise Prescription Goal: Initial exercise prescription builds to 30-45 minutes a day of aerobic activity, 2-3 days per week.  Home exercise guidelines will be given to patient during program as part of exercise prescription that the participant will acknowledge.  Education: Aerobic Exercise: - Group verbal and visual presentation on the components of exercise prescription. Introduces F.I.T.T principle from ACSM for exercise prescriptions.  Reviews F.I.T.T. principles of aerobic exercise including progression. Written material given at graduation. Flowsheet Row Pulmonary Rehab from 12/28/2020 in [facility] Cardiac and Pulmonary Rehab  Date 11/30/20   Educator [facility]  Instruction Review Code 1- Verbalizes Understanding       Education: Resistance Exercise: - Group verbal and visual presentation on the components of exercise prescription. Introduces F.I.T.T principle from ACSM for exercise prescriptions  Reviews F.I.T.T. principles of resistance exercise including progression. Written material given at graduation. Flowsheet Row Pulmonary Rehab from 12/28/2020 in [facility] Cardiac and Pulmonary Rehab  Date 12/07/20  Educator [place]  Instruction Review Code  1- Verbalizes Understanding        Education: Exercise & Equipment Safety: - Individual verbal instruction and demonstration of equipment use and safety with use of the equipment. Flowsheet Row Pulmonary Rehab from 12/28/2020 in [facility] Cardiac and Pulmonary Rehab  Education need identified 11/24/20  Date 11/24/20  Educator [place]  Instruction Review Code 1- Verbalizes Understanding       Education: Exercise Physiology & General Exercise Guidelines: - Group verbal and written instruction with models to review the exercise physiology of the cardiovascular system and associated critical values. Provides general exercise guidelines with specific guidelines to those with heart or lung disease.    Education: Flexibility, Balance, Mind/Body Relaxation: - Group verbal and visual presentation with interactive activity on the components of exercise prescription. Introduces F.I.T.T principle from ACSM for exercise prescriptions. Reviews F.I.T.T. principles of flexibility and balance exercise training including progression. Also discusses the mind body connection.  Reviews various relaxation techniques to help reduce and manage stress (i.e. Deep breathing, progressive muscle relaxation, and visualization). Balance handout provided to take home. Written material given at graduation. Flowsheet Row Pulmonary Rehab from 12/28/2020 in [facility] Cardiac and Pulmonary Rehab  Date 12/14/20  Educator [place]  Instruction Review  Code 1- Verbalizes Understanding       Activity Barriers & Risk Stratification:  Activity Barriers & Cardiac Risk Stratification - 11/24/20 1524       Activity Barriers & Cardiac Risk Stratification   Activity Barriers Shortness of Breath             6 Minute Walk:  6 Minute Walk     Row Name 11/24/20 1523         6 Minute Walk   Phase Initial  Same walk test used from CARE post 6MWT     Distance 1290 feet     Walk Time 6 minutes     # of Rest Breaks 0     MPH 2.44     METS 2.71     RPE 11     Perceived Dyspnea  2     VO2 Peak 9.51     Symptoms No     Resting HR 81 bpm     Resting BP 140/76     Resting Oxygen Saturation  95 %     Exercise Oxygen Saturation  during 6 min walk 88 %     Max Ex. HR 113 bpm     Max Ex. BP 158/70     2 Minute Post BP 134/68             Oxygen Initial Assessment:  Oxygen Initial Assessment - 11/24/20 1559       Home Oxygen   Home Oxygen Device None    Sleep Oxygen Prescription None    Home Exercise Oxygen Prescription None    Home Resting Oxygen Prescription None      Initial 6 min Walk   Oxygen Used None      Program Oxygen Prescription   Program Oxygen Prescription None      Intervention   Short Term Goals To learn and demonstrate proper pursed lip breathing techniques or other breathing techniques. ;To learn and demonstrate proper use of respiratory medications    Long  Term Goals Demonstrates proper use of MDI's;Compliance with respiratory medication;Exhibits proper breathing techniques, such as pursed lip breathing or other method taught during program session             Oxygen Re-Evaluation:  Oxygen Re-Evaluation  [place] Name 12/30/20 1025             Program Oxygen Prescription   Program Oxygen Prescription None               Home Oxygen   Home Oxygen Device None       Sleep Oxygen Prescription None;CPAP  plans to start it tonight       Home Exercise Oxygen Prescription None       Home  Resting Oxygen Prescription None               Goals/Expected Outcomes   Short Term Goals To learn and demonstrate proper pursed lip breathing techniques or other breathing techniques. ;To learn and demonstrate proper use of respiratory medications;To learn and understand importance of maintaining oxygen saturations>88%;To learn and understand importance of monitoring SPO2 with pulse oximeter and demonstrate accurate use of the pulse oximeter.       Long  Term Goals Verbalizes importance of monitoring SPO2 with pulse oximeter and return demonstration;Maintenance of O2 saturations>88%;Exhibits proper breathing techniques, such as pursed lip breathing or other method taught during program session;Compliance with respiratory medication;Demonstrates proper use of MDI's       Comments Melissa Merritt has been doing well in rehab.  She has recently gotten a CPAP and plans to start using it tonight.  Her breathing is getting better and she is good about using her PLB.  She swears by her inhalers!!       Goals/Expected Outcomes Short: Start using CPAP Long: Continued compliance                Oxygen Discharge (Final Oxygen Re-Evaluation):  Oxygen Re-Evaluation - 12/30/20 1025       Program Oxygen Prescription   Program Oxygen Prescription None      Home Oxygen   Home Oxygen Device None    Sleep Oxygen Prescription None;CPAP   plans to start it tonight   Home Exercise Oxygen Prescription None    Home Resting Oxygen Prescription None      Goals/Expected Outcomes   Short Term Goals To learn and demonstrate proper pursed lip breathing techniques or other breathing techniques. ;To learn and demonstrate proper use of respiratory medications;To learn and understand importance of maintaining oxygen saturations>88%;To learn and understand importance of monitoring SPO2 with pulse oximeter and demonstrate accurate use of the pulse oximeter.    Long  Term Goals Verbalizes importance of monitoring SPO2 with pulse  oximeter and return demonstration;Maintenance of O2 saturations>88%;Exhibits proper breathing techniques, such as pursed lip breathing or other method taught during program session;Compliance with respiratory medication;Demonstrates proper use of MDI's    Comments Melissa Merritt has been doing well in rehab.  She has recently gotten a CPAP and plans to start using it tonight.  Her breathing is getting better and she is good about using her PLB.  She swears by her inhalers!!    Goals/Expected Outcomes Short: Start using CPAP Long: Continued compliance             Initial Exercise Prescription:  Initial Exercise Prescription - 11/24/20 1600       Date of Initial Exercise RX and Referring Provider   Date 11/24/20    Referring Provider Ottie Glazier MD      Recumbant Bike   Level 3    RPM 60    Watts 25    Minutes 15    METs 2.71      REL-XR   Level 3  Speed 50    Minutes 15    METs 2.71      T5 Nustep   Level 5    Minutes 15    METs 2.71      Track   Laps 40    Minutes 15    METs 3.18      Prescription Details   Frequency (times per week) 3    Duration Progress to 30 minutes of continuous aerobic without signs/symptoms of physical distress      Intensity   THRR 40-80% of Max Heartrate 108-135    Ratings of Perceived Exertion 11-13    Perceived Dyspnea 0-4      Progression   Progression Continue to progress workloads to maintain intensity without signs/symptoms of physical distress.      Resistance Training   Training Prescription Yes    Weight 5 lb    Reps 10-15             Perform Capillary Blood Glucose checks as needed.  Exercise Prescription Changes:   Exercise Prescription Changes     Row Name 11/24/20 1500 12/05/20 0900 12/19/20 1500 12/30/20 1000 01/02/21 0900     Response to Exercise   Blood Pressure (Admit) 140/76 136/64 104/60 -- 128/76   Blood Pressure (Exercise) 158/70 126/64 142/80 -- 154/64   Blood Pressure (Exit) 134/68 124/70 110/62  -- 122/68   Heart Rate (Admit) 81 bpm 76 bpm 75 bpm -- 80 bpm   Heart Rate (Exercise) 113 bpm 115 bpm 102 bpm -- 102 bpm   Heart Rate (Exit) 85 bpm 99 bpm 70 bpm -- 93 bpm   Oxygen Saturation (Admit) 95 % 95 % 95 % -- 95 %   Oxygen Saturation (Exercise) 88 % 89 % 90 % -- 92 %   Oxygen Saturation (Exit) 97 % 93 % 94 % -- 93 %   Rating of Perceived Exertion (Exercise) 11 15 13  -- 13   Perceived Dyspnea (Exercise) 2 3 2  -- 2   Symptoms none none SOB -- SOB   Comments Pulmonary rehab walk test results (used from CARE) -- -- -- --   Duration -- Continue with 30 min of aerobic exercise without signs/symptoms of physical distress. Continue with 30 min of aerobic exercise without signs/symptoms of physical distress. -- Continue with 30 min of aerobic exercise without signs/symptoms of physical distress.   Intensity -- THRR unchanged THRR unchanged -- THRR unchanged     Progression   Progression -- Continue to progress workloads to maintain intensity without signs/symptoms of physical distress. Continue to progress workloads to maintain intensity without signs/symptoms of physical distress. -- Continue to progress workloads to maintain intensity without signs/symptoms of physical distress.   Average METs -- 3 3.23 -- 2.73     Resistance Training   Training Prescription -- Yes Yes -- Yes   Weight -- 5 lb 5 lb -- 5 lb   Reps -- 10-15 10-15 -- 10-15     Interval Training   Interval Training -- -- No -- No     Recumbant Bike   Level -- -- 3 -- --   Watts -- -- 22 -- --   Minutes -- -- 15 -- --   METs -- -- 2.59 -- --     REL-XR   Level -- 3 3 -- --   Speed -- 50 -- -- --   Minutes -- 15 15 -- --   METs -- 2.9 4.6 -- --  T5 Nustep   Level -- -- -- -- 6   Minutes -- -- -- -- 15   METs -- -- -- -- 2.2     Track   Laps -- 40 27 -- 45   Minutes -- 15 15 -- 15   METs -- 3.18 2.47 -- 3.45     Home Exercise Plan   Plans to continue exercise at -- -- -- Home (comment)  walking Home  (comment)  walking   Frequency -- -- -- Add 2 additional days to program exercise sessions. Add 2 additional days to program exercise sessions.   Initial Home Exercises Provided -- -- -- 12/30/20 12/30/20     Oxygen   Maintain Oxygen Saturation -- -- -- 88% or higher 88% or higher            Exercise Comments:   Exercise Comments     Row Name 11/30/20 1012           Exercise Comments First full day of exercise!  Patient was oriented to gym and equipment including functions, settings, policies, and procedures.  Patient's individual exercise prescription and treatment plan were reviewed.  All starting workloads were established based on the results of the 6 minute walk test done at initial orientation visit.  The plan for exercise progression was also introduced and progression will be customized based on patient's performance and goals.                Exercise Goals and Review:   Exercise Goals     Row Name 11/24/20 1613             Exercise Goals   Increase Physical Activity Yes       Intervention Provide advice, education, support and counseling about physical activity/exercise needs.;Develop an individualized exercise prescription for aerobic and resistive training based on initial evaluation findings, risk stratification, comorbidities and participant's personal goals.       Expected Outcomes Long Term: Add in home exercise to make exercise part of routine and to increase amount of physical activity.;Short Term: Attend rehab on a regular basis to increase amount of physical activity.;Long Term: Exercising regularly at least 3-5 days a week.       Increase Strength and Stamina Yes       Intervention Provide advice, education, support and counseling about physical activity/exercise needs.;Develop an individualized exercise prescription for aerobic and resistive training based on initial evaluation findings, risk stratification, comorbidities and participant's personal  goals.       Expected Outcomes Short Term: Increase workloads from initial exercise prescription for resistance, speed, and METs.;Short Term: Perform resistance training exercises routinely during rehab and add in resistance training at home;Long Term: Improve cardiorespiratory fitness, muscular endurance and strength as measured by increased METs and functional capacity (6MWT)       Able to understand and use rate of perceived exertion (RPE) scale Yes       Intervention Provide education and explanation on how to use RPE scale       Expected Outcomes Short Term: Able to use RPE daily in rehab to express subjective intensity level;Long Term:  Able to use RPE to guide intensity level when exercising independently       Able to understand and use Dyspnea scale Yes       Intervention Provide education and explanation on how to use Dyspnea scale       Expected Outcomes Short Term: Able to use Dyspnea scale daily in rehab  to express subjective sense of shortness of breath during exertion;Long Term: Able to use Dyspnea scale to guide intensity level when exercising independently       Knowledge and understanding of Target Heart Rate Range (THRR) Yes       Intervention Provide education and explanation of THRR including how the numbers were predicted and where they are located for reference       Expected Outcomes Short Term: Able to state/look up THRR;Long Term: Able to use THRR to govern intensity when exercising independently;Short Term: Able to use daily as guideline for intensity in rehab       Able to check pulse independently Yes       Intervention Provide education and demonstration on how to check pulse in carotid and radial arteries.;Review the importance of being able to check your own pulse for safety during independent exercise       Expected Outcomes Short Term: Able to explain why pulse checking is important during independent exercise;Long Term: Able to check pulse independently and accurately        Understanding of Exercise Prescription Yes       Intervention Provide education, explanation, and written materials on patient's individual exercise prescription       Expected Outcomes Short Term: Able to explain program exercise prescription;Long Term: Able to explain home exercise prescription to exercise independently                Exercise Goals Re-Evaluation :  Exercise Goals Re-Evaluation     [place] Name 11/30/20 1012 12/05/20 0935 12/19/20 1517 12/30/20 1029 01/02/21 0911     Exercise Goal Re-Evaluation   Exercise Goals Review Increase Physical Activity;Able to understand and use rate of perceived exertion (RPE) scale;Knowledge and understanding of Target Heart Rate Range (THRR);Understanding of Exercise Prescription;Increase Strength and Stamina;Able to understand and use Dyspnea scale;Able to check pulse independently Increase Physical Activity;Increase Strength and Stamina Increase Physical Activity;Increase Strength and Stamina;Understanding of Exercise Prescription Increase Physical Activity;Increase Strength and Stamina;Understanding of Exercise Prescription;Able to understand and use rate of perceived exertion (RPE) scale;Able to understand and use Dyspnea scale;Knowledge and understanding of Target Heart Rate Range (THRR);Able to check pulse independently Increase Physical Activity;Increase Strength and Stamina   Comments Reviewed RPE and dyspnea scales, THR and program prescription with pt today.  Pt voiced understanding and was given a copy of goals to take home. -- Melissa Merritt is doing well in rehab.  She is 27 laps and 4.6 METs on the XR.  We will continue to monitor her progress. Updated home exercise from CARE with pt today.  Pt plans to walk at home for exercise.  Reviewed THR, pulse, RPE, sign and symptoms, pulse oximetery and when to call 911 or MD.  Also discussed weather considerations and indoor options.  Pt voiced understanding. Melissa Merritt has progressed to 45 laps on the  track! Oxygen sats have stayed in the 90s on room air.  Staff will continue to monitor progress.   Expected Outcomes Short: Use RPE daily to regulate intensity. Long: Follow program prescription in THR. -- Short: Aim for 30 laps Long: Continue to improve stamina Short: Start to add in more walking at home Long: Continue to improve stamina. Short:continue to attend consistently Long: build overall stamina            Discharge Exercise Prescription (Final Exercise Prescription Changes):  Exercise Prescription Changes - 01/02/21 0900       Response to Exercise   Blood Pressure (Admit) 128/76  Blood Pressure (Exercise) 154/64    Blood Pressure (Exit) 122/68    Heart Rate (Admit) 80 bpm    Heart Rate (Exercise) 102 bpm    Heart Rate (Exit) 93 bpm    Oxygen Saturation (Admit) 95 %    Oxygen Saturation (Exercise) 92 %    Oxygen Saturation (Exit) 93 %    Rating of Perceived Exertion (Exercise) 13    Perceived Dyspnea (Exercise) 2    Symptoms SOB    Duration Continue with 30 min of aerobic exercise without signs/symptoms of physical distress.    Intensity THRR unchanged      Progression   Progression Continue to progress workloads to maintain intensity without signs/symptoms of physical distress.    Average METs 2.73      Resistance Training   Training Prescription Yes    Weight 5 lb    Reps 10-15      Interval Training   Interval Training No      T5 Nustep   Level 6    Minutes 15    METs 2.2      Track   Laps 45    Minutes 15    METs 3.45      Home Exercise Plan   Plans to continue exercise at Home (comment)   walking   Frequency Add 2 additional days to program exercise sessions.    Initial Home Exercises Provided 12/30/20      Oxygen   Maintain Oxygen Saturation 88% or higher             Nutrition:  Target Goals: Understanding of nutrition guidelines, daily intake of sodium 1500mg , cholesterol 200mg , calories 30% from fat and 7% or less from saturated  fats, daily to have 5 or more servings of fruits and vegetables.  Education: All About Nutrition: -Group instruction provided by verbal, written material, interactive activities, discussions, models, and posters to present general guidelines for heart healthy nutrition including fat, fiber, MyPlate, the role of sodium in heart healthy nutrition, utilization of the nutrition label, and utilization of this knowledge for meal planning. Follow up email sent as well. Written material given at graduation. Flowsheet Row Pulmonary Rehab from 12/28/2020 in [facility] Cardiac and Pulmonary Rehab  Date 12/28/20  Educator [facility]  Instruction Review Code 1- Verbalizes Understanding       Biometrics:  Pre Biometrics - 11/30/20 1135       Pre Biometrics   Single Leg Stand 3.04 seconds              Nutrition Therapy Plan and Nutrition Goals:  Nutrition Therapy & Goals - 01/04/21 0917       Nutrition Therapy   Diet Heart healthy , low Na, Pulmonary MNT    Protein (specify units) 120g    Fiber 25 grams    Whole Grain Foods 3 servings    Saturated Fats 12 max. grams    Fruits and Vegetables 8 servings/day    Sodium 1.5 grams      Personal Nutrition Goals   Nutrition Goal ST: include nuts/seeds with yogurt (she likes peach yoplait - no greek yogurt), limit sugar sweetened beverages. LT: meet protein/calorie needs, eat more than one meal per day    Comments Still cutting down on sugary drinks.She does not eat breakfast. L: ritz crackers or some leftover which is rare D: [place]. husband will cook a lot of times. Last night she had sloppy joes with no bun and a salad (lite honey mustard dressing) with  potatoes and onions as well grilling beans. She will put kens lite honey mustard on sandwiches as well. She reports no shortness of breath during or after meals. Weight stable. She will drink body armor drinks and other beverages that have zero sugar as she tries to reduce her consumption of sugar sweetened  beverages. Discussed heart healthy eating, pulmonary MNT.      Intervention Plan   Intervention Prescribe, educate and counsel regarding individualized specific dietary modifications aiming towards targeted core components such as weight, hypertension, lipid management, diabetes, heart failure and other comorbidities.    Expected Outcomes Short Term Goal: Understand basic principles of dietary content, such as calories, fat, sodium, cholesterol and nutrients.;Short Term Goal: A plan has been developed with personal nutrition goals set during dietitian appointment.;Long Term Goal: Adherence to prescribed nutrition plan.             Nutrition Assessments:  MEDIFICTS Score Key: ?70 Need to make dietary changes  40-70 Heart Healthy Diet ? 40 Therapeutic Level Cholesterol Diet  Flowsheet Row Pulmonary Rehab from 11/24/2020 in [facility] Cardiac and Pulmonary Rehab  Picture Your Plate Total Score on Admission 57      Picture Your Plate Scores: <73 Unhealthy dietary pattern with much room for improvement. 41-50 Dietary pattern unlikely to meet recommendations for good health and room for improvement. 51-60 More healthful dietary pattern, with some room for improvement.  >60 Healthy dietary pattern, although there may be some specific behaviors that could be improved.   Nutrition Goals Re-Evaluation:  Nutrition Goals Re-Evaluation     [place] Name 12/30/20 0955             Goals   Nutrition Goal Cut back on sugar drinks and heart healthy eating       Comment Melissa Merritt is working on cutting back on her sugary drinks.  She is doing better but doesn't think she can cut it out completely.  She is trying to eat a balance diet otherwise.       Expected Outcome Short: Continue to cut back on soda LOng: Continue to use heart healthy guidelines.                Nutrition Goals Discharge (Final Nutrition Goals Re-Evaluation):  Nutrition Goals Re-Evaluation - 12/30/20 0955       Goals   Nutrition  Goal Cut back on sugar drinks and heart healthy eating    Comment Melissa Merritt is working on cutting back on her sugary drinks.  She is doing better but doesn't think she can cut it out completely.  She is trying to eat a balance diet otherwise.    Expected Outcome Short: Continue to cut back on soda LOng: Continue to use heart healthy guidelines.             Psychosocial: Target Goals: Acknowledge presence or absence of significant depression and/or stress, maximize coping skills, provide positive support system. Participant is able to verbalize types and ability to use techniques and skills needed for reducing stress and depression.   Education: Stress, Anxiety, and Depression - Group verbal and visual presentation to define topics covered.  Reviews how body is impacted by stress, anxiety, and depression.  Also discusses healthy ways to reduce stress and to treat/manage anxiety and depression.  Written material given at graduation.   Education: Sleep Hygiene -Provides group verbal and written instruction about how sleep can affect your health.  Define sleep hygiene, discuss sleep cycles and impact of sleep habits. Review good sleep  hygiene tips.    Initial Review & Psychosocial Screening:  Initial Psych Review & Screening - 11/24/20 1304       Initial Review   Current issues with None Identified      Family Dynamics   Good Support System? Yes   son and his family building house next door, another son and a daughter out of town     Barriers   Psychosocial barriers to participate in program There are no identifiable barriers or psychosocial needs.;The patient should benefit from training in stress management and relaxation.      Screening Interventions   Interventions Encouraged to exercise;Provide feedback about the scores to participant;To provide support and resources with identified psychosocial needs    Expected Outcomes Short Term goal: Utilizing psychosocial counselor, staff and  physician to assist with identification of specific Stressors or current issues interfering with healing process. Setting desired goal for each stressor or current issue identified.;Long Term Goal: Stressors or current issues are controlled or eliminated.;Short Term goal: Identification and review with participant of any Quality of Life or Depression concerns found by scoring the questionnaire.;Long Term goal: The participant improves quality of Life and PHQ9 Scores as seen by post scores and/or verbalization of changes             Quality of Life Scores:  Scores of 19 and below usually indicate a poorer quality of life in these areas.  A difference of  2-3 points is a clinically meaningful difference.  A difference of 2-3 points in the total score of the Quality of Life Index has been associated with significant improvement in overall quality of life, self-image, physical symptoms, and general health in studies assessing change in quality of life.  PHQ-9: Recent Review Flowsheet Data     Depression screen [facility] 2/9 11/24/2020 05/30/2020 05/25/2020 11/23/2019 05/25/2019   Decreased Interest 0 0 0 0 0   Down, Depressed, Hopeless 0 0 0 0 0    PHQ - 2 Score 0 0 0 0 0   Altered sleeping 1 - - - -   Tired, decreased energy 1 - - - -   Change in appetite 0 - - - -   Feeling bad or failure about yourself  0 - - - -   Trouble concentrating 0 - - - -   Moving slowly or fidgety/restless 0 - - - -   Suicidal thoughts 0 - - - -   PHQ-9 Score 2 - - - -   Difficult doing work/chores Not difficult at all - - - -      Interpretation of Total Score  Total Score Depression Severity:  1-4 = Minimal depression, 5-9 = Mild depression, 10-14 = Moderate depression, 15-19 = Moderately severe depression, 20-27 = Severe depression   Psychosocial Evaluation and Intervention:  Psychosocial Evaluation - 11/24/20 1318       Psychosocial Evaluation & Interventions   Comments Janele has no barriers to entering the  program. She is ready to satrt and work on her symptom control. Rodneisha lives with her husband. Their son and his family is building a house next door and until it is completed they are living in a camper at [employer].  Son, his wife and 11yr old twins.  Her husband has recently had a stroke, same time she was getting her cancer treatments. She states she does not feel stressed.  She has other children that live out of town.    Expected Outcomes STG: Katharine Look  will be able to attend all scheduled sessions, she will continue to maintain the low stress levels even with all the events happening in her life.  LTG: Caroljean will continue to maintain her progression    Continue Psychosocial Services  Follow up required by staff             Psychosocial Re-Evaluation:  Psychosocial Re-Evaluation     [place] Name 12/30/20 [id number]             Psychosocial Re-Evaluation   Current issues with None Identified       Comments Melissa Merritt is doing well in rehab. It's grandtwins birthday today and they were up early to open presents.  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  She sleeps pretty well over all. She is going to start using her CPAP.       Expected Outcomes Short: Continue to stay positive Long: Continue take care of self.       Interventions Encouraged to attend Pulmonary Rehabilitation for the exercise                Psychosocial Discharge (Final Psychosocial Re-Evaluation):  Psychosocial Re-Evaluation - 12/30/20 9604       Psychosocial Re-Evaluation   Current issues with None Identified    Comments Melissa Merritt is doing well in rehab. It's grandtwins birthday today and they were up early to open presents.  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  She sleeps pretty well over all. She is going to start using her CPAP.    Expected Outcomes Short: Continue to stay positive Long: Continue take care of self.    Interventions  Encouraged to attend Pulmonary Rehabilitation for the exercise             Education: Education Goals: Education classes will be provided on a weekly basis, covering required topics. Participant will state understanding/return demonstration of topics presented.  Learning Barriers/Preferences:  Learning Barriers/Preferences - 11/24/20 1306       Learning Barriers/Preferences   Learning Barriers None    Learning Preferences None             General Pulmonary Education Topics:  Infection Prevention: - Provides verbal and written material to individual with discussion of infection control including proper hand washing and proper equipment cleaning during exercise session. Flowsheet Row Pulmonary Rehab from 12/28/2020 in [facility] Cardiac and Pulmonary Rehab  Education need identified 11/24/20  Date 11/24/20  Educator [place]  Instruction Review Code 1- Verbalizes Understanding       Falls Prevention: - Provides verbal and written material to individual with discussion of falls prevention and safety. Flowsheet Row Pulmonary Rehab from 12/28/2020 in [facility] Cardiac and Pulmonary Rehab  Education need identified 11/24/20  Date 11/24/20  Educator [place]  Instruction Review Code 1- Verbalizes Understanding       Chronic Lung Disease Review: - Group verbal instruction with posters, models, PowerPoint presentations and videos,  to review new updates, new respiratory medications, new advancements in procedures and treatments. Providing information on websites and "800" numbers for continued self-education. Includes information about supplement oxygen, available portable oxygen systems, continuous and intermittent flow rates, oxygen safety, concentrators, and Medicare reimbursement for oxygen. Explanation of Pulmonary Drugs, including class, frequency, complications, importance of spacers, rinsing mouth after steroid MDI's, and proper cleaning methods for nebulizers. Review of basic lung anatomy  and physiology related to function, structure, and complications of lung disease. Review of risk  factors. Discussion about methods for diagnosing sleep apnea and types of masks and machines for OSA. Includes a review of the use of types of environmental controls: home humidity, furnaces, filters, dust mite/pet prevention, HEPA vacuums. Discussion about weather changes, air quality and the benefits of nasal washing. Instruction on Warning signs, infection symptoms, calling MD promptly, preventive modes, and value of vaccinations. Review of effective airway clearance, coughing and/or vibration techniques. Emphasizing that all should Create an Action Plan. Written material given at graduation.   AED/CPR: - Group verbal and written instruction with the use of models to demonstrate the basic use of the AED with the basic ABC's of resuscitation.    Anatomy and Cardiac Procedures: - Group verbal and visual presentation and models provide information about basic cardiac anatomy and function. Reviews the testing methods done to diagnose heart disease and the outcomes of the test results. Describes the treatment choices: Medical Management, Angioplasty, or Coronary Bypass Surgery for treating various heart conditions including Myocardial Infarction, Angina, Valve Disease, and Cardiac Arrhythmias.  Written material given at graduation. Flowsheet Row Pulmonary Rehab from 12/28/2020 in [facility] Cardiac and Pulmonary Rehab  Date 12/07/20  Educator [place]  Instruction Review Code 1- Verbalizes Understanding       Medication Safety: - Group verbal and visual instruction to review commonly prescribed medications for heart and lung disease. Reviews the medication, class of the drug, and side effects. Includes the steps to properly store meds and maintain the prescription regimen.  Written material given at graduation.   Other: -Provides group and verbal instruction on various topics (see comments)   Knowledge  Questionnaire Score:  Knowledge Questionnaire Score - 11/24/20 1602       Knowledge Questionnaire Score   Pre Score 16/18: Oxygen              Core Components/Risk Factors/Patient Goals at Admission:  Personal Goals and Risk Factors at Admission - 11/24/20 1614       Core Components/Risk Factors/Patient Goals on Admission    Weight Management Yes;Weight Loss    Intervention Weight Management: Develop a combined nutrition and exercise program designed to reach desired caloric intake, while maintaining appropriate intake of nutrient and fiber, sodium and fats, and appropriate energy expenditure required for the weight goal.;Weight Management: Provide education and appropriate resources to help participant work on and attain dietary goals.    Admit Weight 226 lb (102.5 kg)    Goal Weight: Short Term 220 lb (99.8 kg)    Goal Weight: Long Term 180 lb (81.6 kg)    Expected Outcomes Short Term: Continue to assess and modify interventions until short term weight is achieved;Long Term: Adherence to nutrition and physical activity/exercise program aimed toward attainment of established weight goal;Weight Loss: Understanding of general recommendations for a balanced deficit meal plan, which promotes 1-2 lb weight loss per week and includes a negative energy balance of [phone] kcal/d;Understanding recommendations for meals to include 15-35% energy as protein, 25-35% energy from fat, 35-60% energy from carbohydrates, less than 200mg  of dietary cholesterol, 20-35 gm of total fiber daily;Understanding of distribution of calorie intake throughout the day with the consumption of 4-5 meals/snacks    Increase knowledge of respiratory medications and ability to use respiratory devices properly  Yes    Intervention Provide education and demonstration as needed of appropriate use of medications, inhalers, and oxygen therapy.    Expected Outcomes Short Term: Achieves understanding of medications use. Understands  that oxygen is a medication prescribed by physician. Demonstrates appropriate  use of inhaler and oxygen therapy.;Long Term: Maintain appropriate use of medications, inhalers, and oxygen therapy.    Hypertension Yes    Intervention Provide education on lifestyle modifcations including regular physical activity/exercise, weight management, moderate sodium restriction and increased consumption of fresh fruit, vegetables, and low fat dairy, alcohol moderation, and smoking cessation.;Monitor prescription use compliance.    Expected Outcomes Short Term: Continued assessment and intervention until BP is < 140/73mm HG in hypertensive participants. < 130/21mm HG in hypertensive participants with diabetes, heart failure or chronic kidney disease.;Long Term: Maintenance of blood pressure at goal levels.    Lipids Yes    Intervention Provide education and support for participant on nutrition & aerobic/resistive exercise along with prescribed medications to achieve LDL 70mg , HDL >40mg .    Expected Outcomes Short Term: Participant states understanding of desired cholesterol values and is compliant with medications prescribed. Participant is following exercise prescription and nutrition guidelines.;Long Term: Cholesterol controlled with medications as prescribed, with individualized exercise RX and with personalized nutrition plan. Value goals: LDL < 70mg , HDL > 40 mg.             Education:Diabetes - Individual verbal and written instruction to review signs/symptoms of diabetes, desired ranges of glucose level fasting, after meals and with exercise. Acknowledge that pre and post exercise glucose checks will be done for 3 sessions at entry of program.   Know Your Numbers and Heart Failure: - Group verbal and visual instruction to discuss disease risk factors for cardiac and pulmonary disease and treatment options.  Reviews associated critical values for Overweight/Obesity, Hypertension, Cholesterol, and  Diabetes.  Discusses basics of heart failure: signs/symptoms and treatments.  Introduces Heart Failure Zone chart for action plan for heart failure.  Written material given at graduation.   Core Components/Risk Factors/Patient Goals Review:   Goals and Risk Factor Review     Row Name 12/30/20 0957             Core Components/Risk Factors/Patient Goals Review   Personal Goals Review Weight Management/Obesity;Increase knowledge of respiratory medications and ability to use respiratory devices properly.;Improve shortness of breath with ADL's;Hypertension;Lipids       Review Melissa Merritt is doing well in rehab. Her weight goes up and down and she is using her dieruetic.  She is doing better with her breathing and is now on a CPAP but hasnt't started yet.  Continue to montior blood pressures closely.  She loves her inhalers and is good about using them!!       Expected Outcomes Short: Continue to work on weight loss  Long: Conitnue to monitor risk factors.                Core Components/Risk Factors/Patient Goals at Discharge (Final Review):   Goals and Risk Factor Review - 12/30/20 0957       Core Components/Risk Factors/Patient Goals Review   Personal Goals Review Weight Management/Obesity;Increase knowledge of respiratory medications and ability to use respiratory devices properly.;Improve shortness of breath with ADL's;Hypertension;Lipids    Review Melissa Merritt is doing well in rehab. Her weight goes up and down and she is using her dieruetic.  She is doing better with her breathing and is now on a CPAP but hasnt't started yet.  Continue to montior blood pressures closely.  She loves her inhalers and is good about using them!!    Expected Outcomes Short: Continue to work on weight loss  Long: Conitnue to monitor risk factors.  ITP Comments:  ITP Comments     Row Name 11/24/20 1316 11/24/20 1601 11/30/20 1012 12/14/20 0620 01/04/21 0959   ITP Comments Orientation complete   joining Pulmonary Rehab for Asthma Documentation of diagnosis can be found in [place] 10/05/2020 Completed 6MWT and gym orientation. Initial ITP created and sent for review to Dr. Ottie Glazier, Medical Director. First full day of exercise!  Patient was oriented to gym and equipment including functions, settings, policies, and procedures.  Patient's individual exercise prescription and treatment plan were reviewed.  All starting workloads were established based on the results of the 6 minute walk test done at initial orientation visit.  The plan for exercise progression was also introduced and progression will be customized based on patient's performance and goals. 30 Day review completed. Medical Director ITP review done, changes made as directed, and signed approval by Medical Director. Completed initial nutrition consultaion    Row Name 01/11/21 0907           ITP Comments 30 day review completed. ITP sent to Dr. Zetta Bills, Medical Director of Pulmonary Rehab. Continue with ITP unless changes are made by physician.                Comments: 30 day review

## 2021-01-11 NOTE — Progress Notes (Signed)
Daily Session Note  Patient Details  Name: Melissa Merritt MRN: [id number] Date of Birth: Mar 14, 1951 Referring Provider:   Flowsheet Row Pulmonary Rehab from 11/24/2020 in [facility] Cardiac and Pulmonary Rehab  Referring Provider Ottie Glazier MD       Encounter Date: 01/11/2021  Check In:  Session Check In - 01/11/21 1004       Check-In   Supervising physician immediately available to respond to emergencies See telemetry face sheet for immediately available ER MD    Location [facility]-Cardiac & Pulmonary Rehab    Staff Present Birdie Sons, MPA, RN;Melissa Gause, RDN, Rowe Pavy, BA, ACSM CEP, Exercise Physiologist    Virtual Visit No    Medication changes reported     No    Fall or balance concerns reported    No    Tobacco Cessation No Change    Warm-up and Cool-down Performed on first and last piece of equipment    Resistance Training Performed Yes    VAD Patient? No    PAD/SET Patient? No      Pain Assessment   Currently in Pain? No/denies                Social History   Tobacco Use  Smoking Status Former   Types: Cigarettes   Quit date: 07/09/1979   Years since quitting: 41.5  Smokeless Tobacco Never    Goals Met:  Independence with exercise equipment Exercise tolerated well No report of concerns or symptoms today Strength training completed today  Goals Unmet:  Not Applicable  Comments: Pt able to follow exercise prescription today without complaint.  Will continue to monitor for progression.    Dr. Emily Filbert is Medical Director for [place].  Dr. Ottie Glazier is Medical Director for [facility] Pulmonary Rehabilitation.

## 2021-01-13 ENCOUNTER — Other Ambulatory Visit: Payer: Self-pay

## 2021-01-13 ENCOUNTER — Encounter: Payer: Medicare Other | Admitting: *Deleted

## 2021-01-13 DIAGNOSIS — Z87891 Personal history of nicotine dependence: Secondary | ICD-10-CM | POA: Diagnosis not present

## 2021-01-13 DIAGNOSIS — J4530 Mild persistent asthma, uncomplicated: Secondary | ICD-10-CM

## 2021-01-13 NOTE — Progress Notes (Signed)
Daily Session Note  Patient Details  Name: Melissa Merritt MRN: [id number] Date of Birth: 03/07/1951 Referring Provider:   Flowsheet Row Pulmonary Rehab from 11/24/2020 in [facility] Cardiac and Pulmonary Rehab  Referring Provider Aleskerov, Fuad MD       Encounter Date: 01/13/2021  Check In:  Session Check In - 01/13/21 0944       Check-In   Supervising physician immediately available to respond to emergencies See telemetry face sheet for immediately available ER MD    Location [facility]-Cardiac & Pulmonary Rehab    Staff Present Meredith Craven, RN BSN;Joseph Hood, RCP,RRT,BSRT;Jessica Hawkins, MA, RCEP, CCRP, CCET    Virtual Visit No    Medication changes reported     No    Fall or balance concerns reported    No    Warm-up and Cool-down Performed on first and last piece of equipment    Resistance Training Performed Yes    VAD Patient? No    PAD/SET Patient? No      Pain Assessment   Currently in Pain? No/denies                Social History   Tobacco Use  Smoking Status Former   Types: Cigarettes   Quit date: 07/09/1979   Years since quitting: 41.5  Smokeless Tobacco Never    Goals Met:  Independence with exercise equipment Exercise tolerated well No report of concerns or symptoms today Strength training completed today  Goals Unmet:  Not Applicable  Comments: Pt able to follow exercise prescription today without complaint.  Will continue to monitor for progression.    Dr. Mark Miller is Medical Director for [facility].  Dr. Fuad Aleskerov is Medical Director for [facility] Pulmonary Rehabilitation. 

## 2021-01-16 ENCOUNTER — Other Ambulatory Visit: Payer: Self-pay

## 2021-01-16 DIAGNOSIS — J4530 Mild persistent asthma, uncomplicated: Secondary | ICD-10-CM

## 2021-01-16 DIAGNOSIS — Z87891 Personal history of nicotine dependence: Secondary | ICD-10-CM | POA: Diagnosis not present

## 2021-01-16 NOTE — Progress Notes (Signed)
Daily Session Note  Patient Details  Name: Melissa Merritt MRN: [id number] Date of Birth: [birth year] Referring Provider:   Flowsheet Row Pulmonary Rehab from 11/24/2020 in [facility] Cardiac and Pulmonary Rehab  Referring Provider Ottie Glazier MD       Encounter Date: 01/16/2021  Check In:  Session Check In - 01/16/21 0959       Check-In   Supervising physician immediately available to respond to emergencies See telemetry face sheet for immediately available ER MD    Location [facility]-Cardiac & Pulmonary Rehab    Staff Present Earlean Shawl, BS, ACSM CEP, Exercise Physiologist;Amanda Oletta Darter, BA, ACSM CEP, Exercise Physiologist;Tasheka Houseman, RN,BC,MSN    Virtual Visit No    Medication changes reported     No    Fall or balance concerns reported    No    Warm-up and Cool-down Performed on first and last piece of equipment    Resistance Training Performed Yes    VAD Patient? No    PAD/SET Patient? No      PAD/SET Patient   Completed foot check today? No      Pain Assessment   Currently in Pain? No/denies                Social History   Tobacco Use  Smoking Status Former   Types: Cigarettes   Quit date: 07/09/1979   Years since quitting: 41.5  Smokeless Tobacco Never    Goals Met:  Independence with exercise equipment Exercise tolerated well No report of concerns or symptoms today Strength training completed today  Goals Unmet:  Not Applicable  Comments: Pt able to follow exercise prescription today without complaint.  Will continue to monitor for progression.    Dr. Emily Filbert is Medical Director for [place].  Dr. Ottie Glazier is Medical Director for [facility] Pulmonary Rehabilitation.

## 2021-01-18 ENCOUNTER — Other Ambulatory Visit: Payer: Self-pay

## 2021-01-18 DIAGNOSIS — J4530 Mild persistent asthma, uncomplicated: Secondary | ICD-10-CM

## 2021-01-18 DIAGNOSIS — Z87891 Personal history of nicotine dependence: Secondary | ICD-10-CM | POA: Diagnosis not present

## 2021-01-18 NOTE — Progress Notes (Signed)
Daily Session Note  Patient Details  Name: Melissa Merritt MRN: [id number] Date of Birth: 07-15-1950 Referring Provider:   Flowsheet Row Pulmonary Rehab from 11/24/2020 in [facility] Cardiac and Pulmonary Rehab  Referring Provider Ottie Glazier MD       Encounter Date: 01/18/2021  Check In:  Session Check In - 01/18/21 1026       Check-In   Supervising physician immediately available to respond to emergencies See telemetry face sheet for immediately available ER MD    Location [facility]-Cardiac & Pulmonary Rehab    Staff Present Birdie Sons, MPA, Elveria Rising, BA, ACSM CEP, Exercise Physiologist;Joseph Tessie Fass, [place]    Virtual Visit No    Medication changes reported     No    Fall or balance concerns reported    No    Tobacco Cessation No Change    Warm-up and Cool-down Performed on first and last piece of equipment    Resistance Training Performed Yes    VAD Patient? No    PAD/SET Patient? No      Pain Assessment   Currently in Pain? No/denies                Social History   Tobacco Use  Smoking Status Former   Types: Cigarettes   Quit date: 07/09/1979   Years since quitting: 41.5  Smokeless Tobacco Never    Goals Met:  Independence with exercise equipment Exercise tolerated well No report of concerns or symptoms today Strength training completed today  Goals Unmet:  Not Applicable  Comments: Pt able to follow exercise prescription today without complaint.  Will continue to monitor for progression.    Dr. Emily Filbert is Medical Director for [place].  Dr. Ottie Glazier is Medical Director for [facility] Pulmonary Rehabilitation.

## 2021-01-20 ENCOUNTER — Other Ambulatory Visit: Payer: Self-pay

## 2021-01-20 DIAGNOSIS — J4530 Mild persistent asthma, uncomplicated: Secondary | ICD-10-CM | POA: Diagnosis not present

## 2021-01-20 DIAGNOSIS — Z87891 Personal history of nicotine dependence: Secondary | ICD-10-CM | POA: Diagnosis not present

## 2021-01-20 NOTE — Progress Notes (Signed)
Daily Session Note  Patient Details  Name: Melissa Merritt MRN: [id number] Date of Birth: 1950/10/04 Referring Provider:   Flowsheet Row Pulmonary Rehab from 11/24/2020 in [facility] Cardiac and Pulmonary Rehab  Referring Provider Ottie Glazier MD       Encounter Date: 01/20/2021  Check In:  Session Check In - 01/20/21 0940       Check-In   Supervising physician immediately available to respond to emergencies See telemetry face sheet for immediately available ER MD    Location [facility]-Cardiac & Pulmonary Rehab    Staff Present Birdie Sons, MPA, RN;Jessica [place], MA, RCEP, CCRP, CCET;Joseph [place], [place]    Virtual Visit No    Medication changes reported     No    Fall or balance concerns reported    No    Tobacco Cessation No Change    Warm-up and Cool-down Performed on first and last piece of equipment    Resistance Training Performed Yes    VAD Patient? No    PAD/SET Patient? No      Pain Assessment   Currently in Pain? No/denies                Social History   Tobacco Use  Smoking Status Former   Types: Cigarettes   Quit date: 07/09/1979   Years since quitting: 41.5  Smokeless Tobacco Never    Goals Met:  Independence with exercise equipment Exercise tolerated well No report of concerns or symptoms today Strength training completed today  Goals Unmet:  Not Applicable  Comments: Pt able to follow exercise prescription today without complaint.  Will continue to monitor for progression.    Dr. Emily Filbert is Medical Director for [place].  Dr. Ottie Glazier is Medical Director for [facility] Pulmonary Rehabilitation.

## 2021-01-23 ENCOUNTER — Other Ambulatory Visit: Payer: Self-pay

## 2021-01-23 ENCOUNTER — Encounter: Payer: Medicare Other | Admitting: *Deleted

## 2021-01-23 DIAGNOSIS — J4530 Mild persistent asthma, uncomplicated: Secondary | ICD-10-CM | POA: Diagnosis not present

## 2021-01-23 DIAGNOSIS — Z87891 Personal history of nicotine dependence: Secondary | ICD-10-CM | POA: Diagnosis not present

## 2021-01-23 NOTE — Progress Notes (Signed)
Daily Session Note  Patient Details  Name: Melissa Merritt MRN: [id number] Date of Birth: 02/19/1951 Referring Provider:   Flowsheet Row Pulmonary Rehab from 11/24/2020 in [facility] Cardiac and Pulmonary Rehab  Referring Provider Aleskerov, Fuad MD       Encounter Date: 01/23/2021  Check In:  Session Check In - 01/23/21 1035       Check-In   Supervising physician immediately available to respond to emergencies See telemetry face sheet for immediately available ER MD    Location [facility]-Cardiac & Pulmonary Rehab    Staff Present Meredith Craven, RN BSN;Kelly Hayes, BS, ACSM CEP, Exercise Physiologist;Amanda Sommer, BA, ACSM CEP, Exercise Physiologist    Virtual Visit No    Medication changes reported     No    Fall or balance concerns reported    No    Warm-up and Cool-down Performed on first and last piece of equipment    Resistance Training Performed Yes    VAD Patient? No    PAD/SET Patient? No      Pain Assessment   Currently in Pain? No/denies                Social History   Tobacco Use  Smoking Status Former   Types: Cigarettes   Quit date: 07/09/1979   Years since quitting: 41.5  Smokeless Tobacco Never    Goals Met:  Independence with exercise equipment Exercise tolerated well No report of concerns or symptoms today Strength training completed today  Goals Unmet:  Not Applicable  Comments: Pt able to follow exercise prescription today without complaint.  Will continue to monitor for progression.    Dr. Mark Miller is Medical Director for [facility].  Dr. Fuad Aleskerov is Medical Director for [facility] Pulmonary Rehabilitation. 

## 2021-02-01 ENCOUNTER — Other Ambulatory Visit: Payer: Self-pay

## 2021-02-01 DIAGNOSIS — Z87891 Personal history of nicotine dependence: Secondary | ICD-10-CM | POA: Diagnosis not present

## 2021-02-01 DIAGNOSIS — J4530 Mild persistent asthma, uncomplicated: Secondary | ICD-10-CM | POA: Diagnosis not present

## 2021-02-01 NOTE — Progress Notes (Signed)
Daily Session Note  Patient Details  Name: Melissa Merritt MRN: [id number] Date of Birth: 11/05/1950 Referring Provider:   Flowsheet Row Pulmonary Rehab from 11/24/2020 in [facility] Cardiac and Pulmonary Rehab  Referring Provider Ottie Glazier MD       Encounter Date: 02/01/2021  Check In:  Session Check In - 02/01/21 0947       Check-In   Supervising physician immediately available to respond to emergencies See telemetry face sheet for immediately available ER MD    Location [facility]-Cardiac & Pulmonary Rehab    Staff Present Birdie Sons, MPA, Elveria Rising, BA, ACSM CEP, Exercise Physiologist;Joseph [place], RCP,RRT,BSRT;Melissa [place], [place], LDN    Virtual Visit No    Medication changes reported     No    Fall or balance concerns reported    No    Tobacco Cessation No Change    Warm-up and Cool-down Performed on first and last piece of equipment    Resistance Training Performed Yes    VAD Patient? No    PAD/SET Patient? No      Pain Assessment   Currently in Pain? No/denies                Social History   Tobacco Use  Smoking Status Former   Types: Cigarettes   Quit date: 07/09/1979   Years since quitting: 41.5  Smokeless Tobacco Never    Goals Met:  Independence with exercise equipment Exercise tolerated well No report of concerns or symptoms today Strength training completed today  Goals Unmet:  Not Applicable  Comments: Pt able to follow exercise prescription today without complaint.  Will continue to monitor for progression.    Dr. Emily Filbert is Medical Director for [place].  Dr. Ottie Glazier is Medical Director for [facility] Pulmonary Rehabilitation.

## 2021-02-03 ENCOUNTER — Encounter: Payer: Medicare Other | Admitting: *Deleted

## 2021-02-03 ENCOUNTER — Other Ambulatory Visit: Payer: Self-pay

## 2021-02-03 DIAGNOSIS — Z87891 Personal history of nicotine dependence: Secondary | ICD-10-CM | POA: Diagnosis not present

## 2021-02-03 DIAGNOSIS — J4530 Mild persistent asthma, uncomplicated: Secondary | ICD-10-CM

## 2021-02-03 NOTE — Progress Notes (Signed)
Daily Session Note  Patient Details  Name: Melissa Merritt MRN: [id number] Date of Birth: 01-Nov-1950 Referring Provider:   April Manson Pulmonary Rehab from 11/24/2020 in [facility] Cardiac and Pulmonary Rehab  Referring Provider Ottie Glazier MD       Encounter Date: 02/03/2021  Check In:  Session Check In - 02/03/21 0951       Check-In   Supervising physician immediately available to respond to emergencies See telemetry face sheet for immediately available ER MD    Location [facility]-Cardiac & Pulmonary Rehab    Staff Present Renita Papa, RN BSN;Joseph [place], RCP,RRT,BSRT;Jessica [place], [place], RCEP, CCRP, CCET    Virtual Visit No    Medication changes reported     No    Fall or balance concerns reported    No    Warm-up and Cool-down Performed on first and last piece of equipment    Resistance Training Performed Yes    VAD Patient? No    PAD/SET Patient? No      Pain Assessment   Currently in Pain? No/denies                Social History   Tobacco Use  Smoking Status Former   Types: Cigarettes   Quit date: 07/09/1979   Years since quitting: 41.6  Smokeless Tobacco Never    Goals Met:  Independence with exercise equipment Exercise tolerated well No report of concerns or symptoms today Strength training completed today  Goals Unmet:  Not Applicable  Comments: Pt able to follow exercise prescription today without complaint.  Will continue to monitor for progression.    Dr. Emily Filbert is Medical Director for [place].  Dr. Ottie Glazier is Medical Director for [facility] Pulmonary Rehabilitation.

## 2021-02-06 ENCOUNTER — Other Ambulatory Visit: Payer: Self-pay

## 2021-02-06 ENCOUNTER — Encounter: Payer: Medicare Other | Admitting: *Deleted

## 2021-02-06 DIAGNOSIS — J4530 Mild persistent asthma, uncomplicated: Secondary | ICD-10-CM | POA: Diagnosis not present

## 2021-02-06 DIAGNOSIS — Z87891 Personal history of nicotine dependence: Secondary | ICD-10-CM | POA: Diagnosis not present

## 2021-02-06 NOTE — Progress Notes (Signed)
Daily Session Note  Patient Details  Name: Melissa Merritt MRN: [id number] Date of Birth: 05/03/1950 Referring Provider:   April Manson Pulmonary Rehab from 11/24/2020 in [facility] Cardiac and Pulmonary Rehab  Referring Provider Ottie Glazier MD       Encounter Date: 02/06/2021  Check In:  Session Check In - 02/06/21 1015       Check-In   Supervising physician immediately available to respond to emergencies See telemetry face sheet for immediately available ER MD    Location [facility]-Cardiac & Pulmonary Rehab    Staff Present Heath Lark, RN, BSN, CCRP;Laureen Owens Shark, BS, RRT, CPFT;Kelly Amedeo Plenty, BS, ACSM CEP, Exercise Physiologist;Meredith Sherryll Burger, RN BSN    Virtual Visit No    Medication changes reported     No    Fall or balance concerns reported    No    Warm-up and Cool-down Performed on first and last piece of equipment    Resistance Training Performed Yes    VAD Patient? No    PAD/SET Patient? No      PAD/SET Patient   Completed foot check today? No    Open wounds to report? No      Pain Assessment   Currently in Pain? No/denies                Social History   Tobacco Use  Smoking Status Former   Types: Cigarettes   Quit date: 07/09/1979   Years since quitting: 41.6  Smokeless Tobacco Never    Goals Met:  Proper associated with RPD/PD & O2 Sat Independence with exercise equipment Exercise tolerated well No report of concerns or symptoms today  Goals Unmet:  Not Applicable  Comments: Pt able to follow exercise prescription today without complaint.  Will continue to monitor for progression.    Dr. Emily Filbert is Medical Director for [place].  Dr. Ottie Glazier is Medical Director for [facility] Pulmonary Rehabilitation.

## 2021-02-08 ENCOUNTER — Encounter: Payer: Self-pay | Admitting: *Deleted

## 2021-02-08 ENCOUNTER — Other Ambulatory Visit: Payer: Self-pay

## 2021-02-08 ENCOUNTER — Encounter: Payer: Medicare Other | Attending: Nurse Practitioner

## 2021-02-08 DIAGNOSIS — J4530 Mild persistent asthma, uncomplicated: Secondary | ICD-10-CM | POA: Diagnosis not present

## 2021-02-08 NOTE — Progress Notes (Signed)
Daily Session Note  Patient Details  Name: Melissa Merritt MRN: [id number] Date of Birth: 04-Oct-1950 Referring Provider:   Flowsheet Row Pulmonary Rehab from 11/24/2020 in [facility] Cardiac and Pulmonary Rehab  Referring Provider Ottie Glazier MD       Encounter Date: 02/08/2021  Check In:  Session Check In - 02/08/21 1001       Check-In   Supervising physician immediately available to respond to emergencies See telemetry face sheet for immediately available ER MD    Location [facility]-Cardiac & Pulmonary Rehab    Staff Present Birdie Sons, MPA, Elveria Rising, BA, ACSM CEP, Exercise Physiologist;Joseph Tessie Fass, [place]    Virtual Visit No    Medication changes reported     No    Fall or balance concerns reported    No    Tobacco Cessation No Change    Warm-up and Cool-down Performed on first and last piece of equipment    Resistance Training Performed Yes    VAD Patient? No    PAD/SET Patient? No      Pain Assessment   Currently in Pain? No/denies                Social History   Tobacco Use  Smoking Status Former   Types: Cigarettes   Quit date: 07/09/1979   Years since quitting: 41.6  Smokeless Tobacco Never    Goals Met:  Independence with exercise equipment Exercise tolerated well No report of concerns or symptoms today Strength training completed today  Goals Unmet:  Not Applicable  Comments: Pt able to follow exercise prescription today without complaint.  Will continue to monitor for progression.    Dr. Emily Filbert is Medical Director for [place].  Dr. Ottie Glazier is Medical Director for [facility] Pulmonary Rehabilitation.

## 2021-02-08 NOTE — Progress Notes (Signed)
Pulmonary Individual Treatment Plan  Patient Details  Name: Melissa Merritt MRN: [id number] Date of Birth: February 02, 1951 Referring Provider:   Flowsheet Row Pulmonary Rehab from 11/24/2020 in [facility] Cardiac and Pulmonary Rehab  Referring Provider Ottie Glazier MD       Initial Encounter Date:  Flowsheet Row Pulmonary Rehab from 11/24/2020 in [facility] Cardiac and Pulmonary Rehab  Date 11/24/20       Visit Diagnosis: Mild persistent asthma without complication  Patient's Home Medications on Admission:  Current Outpatient Medications:    amLODipine (NORVASC) 5 MG tablet, Take 1 tablet (5 mg total) by mouth daily., Disp: 90 tablet, Rfl: 4   aspirin 81 MG tablet, Take 81 mg by mouth daily., Disp: , Rfl:    Biotin 5000 MCG TABS, Take 5,000 mcg by mouth 2 (two) times daily., Disp: , Rfl:    Calcium Carb-Cholecalciferol (CALCIUM-VITAMIN D) 600-400 MG-UNIT TABS, Take 1 tablet by mouth 2 (two) times daily., Disp: , Rfl:    cholecalciferol (VITAMIN D3) 25 MCG (1000 UNIT) tablet, Take 1,000 Units by mouth in the morning and at bedtime., Disp: , Rfl:    Cyanocobalamin (B-12) 2500 MCG TABS, Take 2,500 mcg by mouth daily., Disp: , Rfl:    denosumab (PROLIA) 60 MG/ML SOSY injection, Inject 60 mg into the skin every 6 (six) months., Disp: , Rfl:    exemestane (AROMASIN) 25 MG tablet, Take 1 tablet (25 mg total) by mouth daily after breakfast., Disp: 30 tablet, Rfl: 3   ferrous sulfate 220 (44 Fe) MG/5ML solution, Take 220 mg by mouth daily., Disp: , Rfl:    furosemide (LASIX) 20 MG tablet, Take by mouth., Disp: , Rfl:    ibuprofen (ADVIL) 200 MG tablet, Take 400 mg by mouth every 6 (six) hours as needed for moderate pain. , Disp: , Rfl:    metoprolol succinate (TOPROL-XL) 25 MG 24 hr tablet, Take 1 tablet (25 mg total) by mouth daily., Disp: 90 tablet, Rfl: 4   pantoprazole (PROTONIX) 40 MG tablet, Take 1 tablet (40 mg total) by mouth 2 (two) times daily., Disp: 180 tablet, Rfl: 4   Red Yeast Rice Extract  (RED YEAST RICE PO), Take 1 tablet by mouth in the morning and at bedtime., Disp: , Rfl:    St Johns Wort 300 MG CAPS, Take 300 mg by mouth 2 (two) times daily., Disp: , Rfl:    TRELEGY ELLIPTA 100-62.5-25 MCG/INH AEPB, Inhale 1 puff into the lungs daily., Disp: , Rfl:    valsartan (DIOVAN) 160 MG tablet, Take 1 tablet (160 mg total) by mouth daily., Disp: 90 tablet, Rfl: 4  Past Medical History: Past Medical History:  Diagnosis Date   Benign tumor of lung    Breast cancer ([place])    Cancer ([place])    right breast   CHF (congestive heart failure) ([facility])    Dyspnea    GERD (gastroesophageal reflux disease)    Hyperlipidemia    Hypertension    Insomnia    Osteopenia    Personal history of radiation therapy    Plantar fasciitis    PONV (postoperative nausea and vomiting)    vomiting 2 days after lung surgery   S/P pneumonectomy    right lower lobe   Urinary incontinence     Tobacco Use: Social History   Tobacco Use  Smoking Status Former   Types: Cigarettes   Quit date: 07/09/1979   Years since quitting: 41.6  Smokeless Tobacco Never    Labs: Recent Review Scientist, physiological  Labs for ITP Cardiac and Pulmonary Rehab Latest Ref Rng & Units 11/17/2018 05/25/2019 11/23/2019 05/25/2020 11/22/2020   Cholestrol 100 - 199 mg/dL 217(H) 207(H) 207(H) 227(H) 231(H)   LDLCALC 0 - 99 mg/dL 127(H) 122(H) 128(H) 150(H) 143(H)   HDL >39 mg/dL 58 59 54 55 55   Trlycerides 0 - 149 mg/dL 160(H) 146 139 124 187(H)   Hemoglobin A1c 4.8 - 5.6 % - - 5.6 5.6 5.8(H)        Pulmonary Assessment Scores:  Pulmonary Assessment Scores     Row Name 11/24/20 1559 11/30/20 1134       ADL UCSD   ADL Phase Entry --    SOB Score total 47 47    Rest 1 1    Walk 2 2    Stairs 4 4    Bath 1 1    Dress 1 1    Shop 2 2      CAT Score   CAT Score 18 --      mMRC Score   mMRC Score -- 2             UCSD: Self-administered rating of dyspnea associated with activities of daily living  (ADLs) 6-point scale (0 = "not at all" to 5 = "maximal or unable to do because of breathlessness")  Scoring Scores range from 0 to 120.  Minimally important difference is 5 units  CAT: CAT can identify the health impairment of COPD patients and is better correlated with disease progression.  CAT has a scoring range of zero to 40. The CAT score is classified into four groups of low (less than 10), medium (10 - 20), high (21-30) and very high (31-40) based on the impact level of disease on health status. A CAT score over 10 suggests significant symptoms.  A worsening CAT score could be explained by an exacerbation, poor medication adherence, poor inhaler technique, or progression of COPD or comorbid conditions.  CAT MCID is 2 points  mMRC: mMRC (Modified Medical Research Council) Dyspnea Scale is used to assess the degree of baseline functional disability in patients of respiratory disease due to dyspnea. No minimal important difference is established. A decrease in score of 1 point or greater is considered a positive change.   Pulmonary Function Assessment:   Exercise Target Goals: Exercise Program Goal: Individual exercise prescription set using results from initial 6 min walk test and THRR while considering  patient's activity barriers and safety.   Exercise Prescription Goal: Initial exercise prescription builds to 30-45 minutes a day of aerobic activity, 2-3 days per week.  Home exercise guidelines will be given to patient during program as part of exercise prescription that the participant will acknowledge.  Education: Aerobic Exercise: - Group verbal and visual presentation on the components of exercise prescription. Introduces F.I.T.T principle from ACSM for exercise prescriptions.  Reviews F.I.T.T. principles of aerobic exercise including progression. Written material given at graduation. Flowsheet Row Pulmonary Rehab from 02/01/2021 in [facility] Cardiac and Pulmonary Rehab  Date 11/30/20   Educator [facility]  Instruction Review Code 1- Verbalizes Understanding       Education: Resistance Exercise: - Group verbal and visual presentation on the components of exercise prescription. Introduces F.I.T.T principle from ACSM for exercise prescriptions  Reviews F.I.T.T. principles of resistance exercise including progression. Written material given at graduation. Flowsheet Row Pulmonary Rehab from 02/01/2021 in [facility] Cardiac and Pulmonary Rehab  Date 12/07/20  Educator [place]  Instruction Review Code 1- Verbalizes Understanding  Education: Exercise & Equipment Safety: - Individual verbal instruction and demonstration of equipment use and safety with use of the equipment. Flowsheet Row Pulmonary Rehab from 02/01/2021 in [facility] Cardiac and Pulmonary Rehab  Education need identified 11/24/20  Date 11/24/20  Educator [place]  Instruction Review Code 1- Verbalizes Understanding       Education: Exercise Physiology & General Exercise Guidelines: - Group verbal and written instruction with models to review the exercise physiology of the cardiovascular system and associated critical values. Provides general exercise guidelines with specific guidelines to those with heart or lung disease.    Education: Flexibility, Balance, Mind/Body Relaxation: - Group verbal and visual presentation with interactive activity on the components of exercise prescription. Introduces F.I.T.T principle from ACSM for exercise prescriptions. Reviews F.I.T.T. principles of flexibility and balance exercise training including progression. Also discusses the mind body connection.  Reviews various relaxation techniques to help reduce and manage stress (i.e. Deep breathing, progressive muscle relaxation, and visualization). Balance handout provided to take home. Written material given at graduation. Flowsheet Row Pulmonary Rehab from 02/01/2021 in [facility] Cardiac and Pulmonary Rehab  Date 12/14/20  Educator [place]  Instruction  Review Code 1- Verbalizes Understanding       Activity Barriers & Risk Stratification:  Activity Barriers & Cardiac Risk Stratification - 11/24/20 1524       Activity Barriers & Cardiac Risk Stratification   Activity Barriers Shortness of Breath             6 Minute Walk:  6 Minute Walk     Row Name 11/24/20 1523         6 Minute Walk   Phase Initial  Same walk test used from [facility]ARE post 6MWT     Distance 1290 feet     Walk Time 6 minutes     # of Rest Breaks 0     MPH 2.44     METS 2.71     RPE 11     Perceived Dyspnea  2     VO2 Peak 9.51     Symptoms No     Resting HR 81 bpm     Resting BP 140/76     Resting Oxygen Saturation  95 %     Exercise Oxygen Saturation  during 6 min walk 88 %     Max Ex. HR 113 bpm     Max Ex. BP 158/70     2 Minute Post BP 134/68             Oxygen Initial Assessment:  Oxygen Initial Assessment - 11/24/20 1559       Home Oxygen   Home Oxygen Device None    Sleep Oxygen Prescription None    Home Exercise Oxygen Prescription None    Home Resting Oxygen Prescription None      Initial 6 min Walk   Oxygen Used None      Program Oxygen Prescription   Program Oxygen Prescription None      Intervention   Short Term Goals To learn and demonstrate proper pursed lip breathing techniques or other breathing techniques. ;To learn and demonstrate proper use of respiratory medications    Long  Term Goals Demonstrates proper use of MDI's;Compliance with respiratory medication;Exhibits proper breathing techniques, such as pursed lip breathing or other method taught during program session             Oxygen Re-Evaluation:  Oxygen Re-Evaluation     Row Name 12/30/20 1025 01/18/21 1000  Program Oxygen Prescription   Program Oxygen Prescription None None        Home Oxygen   Home Oxygen Device None None      Sleep Oxygen Prescription None;CPAP  plans to start it tonight None;CPAP      Home Exercise Oxygen  Prescription None None      Home Resting Oxygen Prescription None None      Compliance with Home Oxygen Use -- Yes        Goals/Expected Outcomes   Short Term Goals To learn and demonstrate proper pursed lip breathing techniques or other breathing techniques. ;To learn and demonstrate proper use of respiratory medications;To learn and understand importance of maintaining oxygen saturations>88%;To learn and understand importance of monitoring SPO2 with pulse oximeter and demonstrate accurate use of the pulse oximeter. To learn and understand importance of maintaining oxygen saturations>88%;To learn and understand importance of monitoring SPO2 with pulse oximeter and demonstrate accurate use of the pulse oximeter.      Long  Term Goals Verbalizes importance of monitoring SPO2 with pulse oximeter and return demonstration;Maintenance of O2 saturations>88%;Exhibits proper breathing techniques, such as pursed lip breathing or other method taught during program session;Compliance with respiratory medication;Demonstrates proper use of MDI's Maintenance of O2 saturations>88%;Verbalizes importance of monitoring SPO2 with pulse oximeter and return demonstration      Comments Melissa Merritt has been doing well in rehab.  She has recently gotten a CPAP and plans to start using it tonight.  Her breathing is getting better and she is good about using her PLB.  She swears by her inhalers!! Melissa Merritt states she has been using her CPAP more and is trying to get used to it.She has a pulse oximeter to check her oxygen saturation at home. Informed and explained why it is important to have one. Reviewed that oxygen saturations should be 88 percent and above.      Goals/Expected Outcomes Short: Start using CPAP Long: Continued compliance Short: monitor oxygen at home with exertion. Long: maintain oxygen saturations above 88 percent independently.               Oxygen Discharge (Final Oxygen Re-Evaluation):  Oxygen Re-Evaluation -  01/18/21 1000       Program Oxygen Prescription   Program Oxygen Prescription None      Home Oxygen   Home Oxygen Device None    Sleep Oxygen Prescription None;CPAP    Home Exercise Oxygen Prescription None    Home Resting Oxygen Prescription None    Compliance with Home Oxygen Use Yes      Goals/Expected Outcomes   Short Term Goals To learn and understand importance of maintaining oxygen saturations>88%;To learn and understand importance of monitoring SPO2 with pulse oximeter and demonstrate accurate use of the pulse oximeter.    Long  Term Goals Maintenance of O2 saturations>88%;Verbalizes importance of monitoring SPO2 with pulse oximeter and return demonstration    Comments Melissa Merritt states she has been using her CPAP more and is trying to get used to it.She has a pulse oximeter to check her oxygen saturation at home. Informed and explained why it is important to have one. Reviewed that oxygen saturations should be 88 percent and above.    Goals/Expected Outcomes Short: monitor oxygen at home with exertion. Long: maintain oxygen saturations above 88 percent independently.             Initial Exercise Prescription:  Initial Exercise Prescription - 11/24/20 1600       Date of Initial Exercise RX  and Referring Provider   Date 11/24/20    Referring Provider Ottie Glazier MD      Recumbant Bike   Level 3    RPM 60    Watts 25    Minutes 15    METs 2.71      REL-XR   Level 3    Speed 50    Minutes 15    METs 2.71      T5 Nustep   Level 5    Minutes 15    METs 2.71      Track   Laps 40    Minutes 15    METs 3.18      Prescription Details   Frequency (times per week) 3    Duration Progress to 30 minutes of continuous aerobic without signs/symptoms of physical distress      Intensity   THRR 40-80% of Max Heartrate 108-135    Ratings of Perceived Exertion 11-13    Perceived Dyspnea 0-4      Progression   Progression Continue to progress workloads to maintain  intensity without signs/symptoms of physical distress.      Resistance Training   Training Prescription Yes    Weight 5 lb    Reps 10-15             Perform Capillary Blood Glucose checks as needed.  Exercise Prescription Changes:   Exercise Prescription Changes     Row Name 11/24/20 1500 12/05/20 0900 12/19/20 1500 12/30/20 1000 01/02/21 0900     Response to Exercise   Blood Pressure (Admit) 140/76 136/64 104/60 -- 128/76   Blood Pressure (Exercise) 158/70 126/64 142/80 -- 154/64   Blood Pressure (Exit) 134/68 124/70 110/62 -- 122/68   Heart Rate (Admit) 81 bpm 76 bpm 75 bpm -- 80 bpm   Heart Rate (Exercise) 113 bpm 115 bpm 102 bpm -- 102 bpm   Heart Rate (Exit) 85 bpm 99 bpm 70 bpm -- 93 bpm   Oxygen Saturation (Admit) 95 % 95 % 95 % -- 95 %   Oxygen Saturation (Exercise) 88 % 89 % 90 % -- 92 %   Oxygen Saturation (Exit) 97 % 93 % 94 % -- 93 %   Rating of Perceived Exertion (Exercise) '11 15 13 ' -- 13   Perceived Dyspnea (Exercise) '2 3 2 ' -- 2   Symptoms none none SOB -- SOB   Comments Pulmonary rehab walk test results (used from CARE) -- -- -- --   Duration -- Continue with 30 min of aerobic exercise without signs/symptoms of physical distress. Continue with 30 min of aerobic exercise without signs/symptoms of physical distress. -- Continue with 30 min of aerobic exercise without signs/symptoms of physical distress.   Intensity -- THRR unchanged THRR unchanged -- THRR unchanged     Progression   Progression -- Continue to progress workloads to maintain intensity without signs/symptoms of physical distress. Continue to progress workloads to maintain intensity without signs/symptoms of physical distress. -- Continue to progress workloads to maintain intensity without signs/symptoms of physical distress.   Average METs -- 3 3.23 -- 2.73     Resistance Training   Training Prescription -- Yes Yes -- Yes   Weight -- 5 lb 5 lb -- 5 lb   Reps -- 10-15 10-15 -- 10-15      Interval Training   Interval Training -- -- No -- No     Recumbant Bike   Level -- -- 3 -- --   Watts -- --  22 -- --   Minutes -- -- 15 -- --   METs -- -- 2.59 -- --     REL-XR   Level -- 3 3 -- --   Speed -- 50 -- -- --   Minutes -- 15 15 -- --   METs -- 2.9 4.6 -- --     T5 Nustep   Level -- -- -- -- 6   Minutes -- -- -- -- 15   METs -- -- -- -- 2.2     Track   Laps -- 40 27 -- 45   Minutes -- 15 15 -- 15   METs -- 3.18 2.47 -- 3.45     Home Exercise Plan   Plans to continue exercise at -- -- -- Home (comment)  walking Home (comment)  walking   Frequency -- -- -- Add 2 additional days to program exercise sessions. Add 2 additional days to program exercise sessions.   Initial Home Exercises Provided -- -- -- 12/30/20 12/30/20     Oxygen   Maintain Oxygen Saturation -- -- -- 88% or higher 88% or higher    Row Name 01/16/21 1000 01/30/21 1300           Response to Exercise   Blood Pressure (Admit) 126/72 134/84      Blood Pressure (Exercise) 138/64 142/70      Blood Pressure (Exit) 122/78 124/72      Heart Rate (Admit) 88 bpm 82 bpm      Heart Rate (Exercise) 112 bpm 104 bpm      Heart Rate (Exit) 93 bpm 87 bpm      Oxygen Saturation (Admit) 95 % 93 %      Oxygen Saturation (Exercise) 94 % 90 %      Oxygen Saturation (Exit) 94 % 95 %      Rating of Perceived Exertion (Exercise) 17 12      Perceived Dyspnea (Exercise) 3 3      Symptoms SOB --      Duration Continue with 30 min of aerobic exercise without signs/symptoms of physical distress. Continue with 30 min of aerobic exercise without signs/symptoms of physical distress.      Intensity THRR unchanged THRR unchanged        Progression   Progression Continue to progress workloads to maintain intensity without signs/symptoms of physical distress. Continue to progress workloads to maintain intensity without signs/symptoms of physical distress.      Average METs 3.09 2.9        Resistance Training   Training  Prescription Yes Yes      Weight 5 lb 5 lb      Reps 10-15 10-15        Interval Training   Interval Training No No        T5 Nustep   Level 6 6      Minutes 15 15      METs 3.2 2.4        Track   Laps 31 45      Minutes 15 15      METs 2.69 3.28        Home Exercise Plan   Plans to continue exercise at Home (comment)  walking Home (comment)  walking      Frequency Add 2 additional days to program exercise sessions. Add 2 additional days to program exercise sessions.      Initial Home Exercises Provided 12/30/20 12/30/20  Oxygen   Maintain Oxygen Saturation 88% or higher 88% or higher               Exercise Comments:   Exercise Comments     Row Name 11/30/20 1012           Exercise Comments First full day of exercise!  Patient was oriented to gym and equipment including functions, settings, policies, and procedures.  Patient's individual exercise prescription and treatment plan were reviewed.  All starting workloads were established based on the results of the 6 minute walk test done at initial orientation visit.  The plan for exercise progression was also introduced and progression will be customized based on patient's performance and goals.                Exercise Goals and Review:   Exercise Goals     Row Name 11/24/20 1613             Exercise Goals   Increase Physical Activity Yes       Intervention Provide advice, education, support and counseling about physical activity/exercise needs.;Develop an individualized exercise prescription for aerobic and resistive training based on initial evaluation findings, risk stratification, comorbidities and participant's personal goals.       Expected Outcomes Long Term: Add in home exercise to make exercise part of routine and to increase amount of physical activity.;Short Term: Attend rehab on a regular basis to increase amount of physical activity.;Long Term: Exercising regularly at least 3-5 days a week.        Increase Strength and Stamina Yes       Intervention Provide advice, education, support and counseling about physical activity/exercise needs.;Develop an individualized exercise prescription for aerobic and resistive training based on initial evaluation findings, risk stratification, comorbidities and participant's personal goals.       Expected Outcomes Short Term: Increase workloads from initial exercise prescription for resistance, speed, and METs.;Short Term: Perform resistance training exercises routinely during rehab and add in resistance training at home;Long Term: Improve cardiorespiratory fitness, muscular endurance and strength as measured by increased METs and functional capacity (6MWT)       Able to understand and use rate of perceived exertion (RPE) scale Yes       Intervention Provide education and explanation on how to use RPE scale       Expected Outcomes Short Term: Able to use RPE daily in rehab to express subjective intensity level;Long Term:  Able to use RPE to guide intensity level when exercising independently       Able to understand and use Dyspnea scale Yes       Intervention Provide education and explanation on how to use Dyspnea scale       Expected Outcomes Short Term: Able to use Dyspnea scale daily in rehab to express subjective sense of shortness of breath during exertion;Long Term: Able to use Dyspnea scale to guide intensity level when exercising independently       Knowledge and understanding of Target Heart Rate Range (THRR) Yes       Intervention Provide education and explanation of THRR including how the numbers were predicted and where they are located for reference       Expected Outcomes Short Term: Able to state/look up THRR;Long Term: Able to use THRR to govern intensity when exercising independently;Short Term: Able to use daily as guideline for intensity in rehab       Able to check pulse independently Yes  Intervention Provide education and  demonstration on how to check pulse in carotid and radial arteries.;Review the importance of being able to check your own pulse for safety during independent exercise       Expected Outcomes Short Term: Able to explain why pulse checking is important during independent exercise;Long Term: Able to check pulse independently and accurately       Understanding of Exercise Prescription Yes       Intervention Provide education, explanation, and written materials on patient's individual exercise prescription       Expected Outcomes Short Term: Able to explain program exercise prescription;Long Term: Able to explain home exercise prescription to exercise independently                Exercise Goals Re-Evaluation :  Exercise Goals Re-Evaluation     Melissa Merritt Name 11/30/20 1012 12/05/20 0935 12/19/20 1517 12/30/20 1029 01/02/21 0911     Exercise Goal Re-Evaluation   Exercise Goals Review Increase Physical Activity;Able to understand and use rate of perceived exertion (RPE) scale;Knowledge and understanding of Target Heart Rate Range (THRR);Understanding of Exercise Prescription;Increase Strength and Stamina;Able to understand and use Dyspnea scale;Able to check pulse independently Increase Physical Activity;Increase Strength and Stamina Increase Physical Activity;Increase Strength and Stamina;Understanding of Exercise Prescription Increase Physical Activity;Increase Strength and Stamina;Understanding of Exercise Prescription;Able to understand and use rate of perceived exertion (RPE) scale;Able to understand and use Dyspnea scale;Knowledge and understanding of Target Heart Rate Range (THRR);Able to check pulse independently Increase Physical Activity;Increase Strength and Stamina   Comments Reviewed RPE and dyspnea scales, THR and program prescription with pt today.  Pt voiced understanding and was given a copy of goals to take home. -- Melissa Merritt is doing well in rehab.  She is 27 laps and 4.6 METs on the XR.  We will  continue to monitor her progress. Updated home exercise from [facility] with pt today.  Pt plans to walk at home for exercise.  Reviewed THR, pulse, RPE, sign and symptoms, pulse oximetery and when to call 911 or MD.  Also discussed weather considerations and indoor options.  Pt voiced understanding. Melissa Merritt has progressed to 45 laps on the track! Oxygen sats have stayed in the 90s on room air.  Staff will continue to monitor progress.   Expected Outcomes Short: Use RPE daily to regulate intensity. Long: Follow program prescription in THR. -- Short: Aim for 30 laps Long: Continue to improve stamina Short: Start to add in more walking at home Long: Continue to improve stamina. Short:continue to attend consistently Long: build overall stamina    Row Name 01/16/21 1036 01/30/21 1356           Exercise Goal Re-Evaluation   Exercise Goals Review Increase Physical Activity;Increase Strength and Stamina Increase Physical Activity;Increase Strength and Stamina      Comments Melissa Merritt is doing well in rehab. She did increase to level 6 on the T5 Nustep. Her number of laps on the track vary each week and should maintain a consistency/increase over time. Will continue to monitor. Melissa Merritt does well on the T5 and track.   Oxygen stays in the 90s. She does not reach THR range and RPE is 11-12. Staff will encourage her to try new equipment or possibly intervals.      Expected Outcomes Short: Maintain consistent laps on track and continue to increase number Long: Continue to increase overall MET level Short: try new equipment Long:  build overall stamina  Discharge Exercise Prescription (Final Exercise Prescription Changes):  Exercise Prescription Changes - 01/30/21 1300       Response to Exercise   Blood Pressure (Admit) 134/84    Blood Pressure (Exercise) 142/70    Blood Pressure (Exit) 124/72    Heart Rate (Admit) 82 bpm    Heart Rate (Exercise) 104 bpm    Heart Rate (Exit) 87 bpm    Oxygen  Saturation (Admit) 93 %    Oxygen Saturation (Exercise) 90 %    Oxygen Saturation (Exit) 95 %    Rating of Perceived Exertion (Exercise) 12    Perceived Dyspnea (Exercise) 3    Duration Continue with 30 min of aerobic exercise without signs/symptoms of physical distress.    Intensity THRR unchanged      Progression   Progression Continue to progress workloads to maintain intensity without signs/symptoms of physical distress.    Average METs 2.9      Resistance Training   Training Prescription Yes    Weight 5 lb    Reps 10-15      Interval Training   Interval Training No      T5 Nustep   Level 6    Minutes 15    METs 2.4      Track   Laps 45    Minutes 15    METs 3.28      Home Exercise Plan   Plans to continue exercise at Home (comment)   walking   Frequency Add 2 additional days to program exercise sessions.    Initial Home Exercises Provided 12/30/20      Oxygen   Maintain Oxygen Saturation 88% or higher             Nutrition:  Target Goals: Understanding of nutrition guidelines, daily intake of sodium <1565m, cholesterol <2041m calories 30% from fat and 7% or less from saturated fats, daily to have 5 or more servings of fruits and vegetables.  Education: All About Nutrition: -Group instruction provided by verbal, written material, interactive activities, discussions, models, and posters to present general guidelines for heart healthy nutrition including fat, fiber, MyPlate, the role of sodium in heart healthy nutrition, utilization of the nutrition label, and utilization of this knowledge for meal planning. Follow up email sent as well. Written material given at graduation. [facility]  Date 12/28/20  [facility] Review Code 1- Verbalizes Understanding       Biometrics:  Pre Biometrics - 11/30/20 1135       Pre Biometrics   Single Leg Stand 3.04 seconds               Nutrition Therapy Plan and Nutrition Goals:  Nutrition Therapy & Goals - 01/04/21 0917       Nutrition Therapy   Diet Heart healthy , low Na, Pulmonary MNT    Protein (specify units) 120g    Fiber 25 grams    Whole Grain Foods 3 servings    Saturated Fats 12 max. grams    Fruits and Vegetables 8 servings/day    Sodium 1.5 grams      Personal Nutrition Goals   Nutrition Goal ST: include nuts/seeds with yogurt (she likes peach yoplait - no greek yogurt), limit sugar sweetened beverages. LT: meet protein/calorie needs, eat more than one meal per day    Comments Still cutting down on sugary drinks.She does not eat breakfast. L: ritz crackers or some leftover which  is rare D: Mykinos. husband will cook a lot of times. Last night she had sloppy joes with no bun and a salad (lite honey mustard dressing) with potatoes and onions as well grilling beans. She will put kens lite honey mustard on sandwiches as well. She reports no shortness of breath during or after meals. Weight stable. She will drink body armor drinks and other beverages that have zero sugar as she tries to reduce her consumption of sugar sweetened beverages. Discussed heart healthy eating, pulmonary MNT.      Intervention Plan   Intervention Prescribe, educate and counsel regarding individualized specific dietary modifications aiming towards targeted core components such as weight, hypertension, lipid management, diabetes, heart failure and other comorbidities.    Expected Outcomes Short Term Goal: Understand basic principles of dietary content, such as calories, fat, sodium, cholesterol and nutrients.;Short Term Goal: A plan has been developed with personal nutrition goals set during dietitian appointment.;Long Term Goal: Adherence to prescribed nutrition plan.             Nutrition Assessments:  MEDIFICTS Score Key: ?70 Need to make dietary changes  40-70 Heart Healthy Diet ? 40 Therapeutic Level Cholesterol  Diet  Flowsheet Row Pulmonary Rehab from 11/24/2020 in [facility] Card[facility] Pulmonary Rehab  Picture Your Plate Total Score on Admission 57      Picture Your Plate Scores: <01 Unhealthy dietary pattern with much room for improvement. 41-50 Dietary pattern unlikely to meet recommendations for good health and room for improvement. 51-60 More healthful dietary pattern, with some room for improvement.  >60 Healthy dietary pattern, although there may be some specific behaviors that could be improved.   Nutrition Goals Re-Evaluation:  Nutrition Goals Re-Evaluation     [place] Name 12/30/20 0955 01/18/21 1003           Goals   Current Weight -- 228 lb (103.4 kg)      Nutrition Goal Cut back on sugar drinks and heart healthy eating She would like to lose weight and reduce her sugar intake.      Comment Melissa Merritt is working on cutting back on her sugary drinks.  She is doing better but doesn't think she can cut it out completely.  She is trying to eat a balance diet otherwise. Melissa Merritt is working on reducing the number of Pepsi she drinks a day. She has cut back to 12 Pepsi's a day. She knows she needs to stop drinking the sugar drinks. She has reflux and takes protonics once a day.      Expected Outcome Short: Continue to cut back on soda LOng: Continue to use heart healthy guidelines. Short: cut back on Pepsi. Long: reduce to 1 Pepsi per day.               Nutrition Goals Discharge (Final Nutrition Goals Re-Evaluation):  Nutrition Goals Re-Evaluation - 01/18/21 1003       Goals   Current Weight 228 lb (103.4 kg)    Nutrition Goal She would like to lose weight and reduce her sugar intake.    Comment Melissa Merritt is working on reducing the number of Pepsi she drinks a day. She has cut back to 12 Pepsi's a day. She knows she needs to stop drinking the sugar drinks. She has reflux and takes protonics once a day.    Expected Outcome Short: cut back on Pepsi. Long: reduce to 1 Pepsi per day.              Psychosocial: Target  Goals: Acknowledge presence or absence of significant depression and/or stress, maximize coping skills, provide positive support system. Participant is able to verbalize types and ability to use techniques and skills needed for reducing stress and depression.   Education: Stress, Anxiety, and Depression - Group verbal and visual presentation to define topics covered.  Reviews how body is impacted by stress, anxiety, and depression.  Also discusses healthy ways to reduce stress and to treat/manage anxiety and depression.  Written material given at graduation. Flowsheet Row Pulmonary Rehab from 02/01/2021 in [facility] Card[facility] and Pulmonary Rehab  Date 01/18/21  Educator [facility]  Instruction Review Code 1- Verbalizes Understanding       Education: Sleep Hygiene -Provides group verbal and written instruction about how sleep can affect your health.  Define sleep hygiene, discuss sleep cycles and impact of sleep habits. Review good sleep hygiene tips.    Initial Review & Psychosocial Screening:  Initial Psych Review & Screening - 11/24/20 1304       Initial Review   Current issues with None Identified      Family Dynamics   Good Support System? Yes   son and his family building house next door, another son and a daughter out of town     Barriers   Psychosocial barriers to participate in program There are no identifiable barriers or psychosocial needs.;The patient should benefit from training in stress management and relaxation.      Screening Interventions   Interventions Encouraged to exercise;Provide feedback about the scores to participant;To provide support and resources with identified psychosocial needs    Expected Outcomes Short Term goal: Utilizing psychosocial counselor, staff and physician to assist with identification of specific Stressors or current issues interfering with healing process. Setting desired goal for each stressor or current issue identified.;Long  Term Goal: Stressors or current issues are controlled or eliminated.;Short Term goal: Identification and review with participant of any Quality of Life or Depression concerns found by scoring the questionnaire.;Long Term goal: The participant improves quality of Life and PHQ9 Scores as seen by post scores and/or verbalization of changes             Quality of Life Scores:  Scores of 19 and below usually indicate a poorer quality of life in these areas.  A difference of  2-3 points is a clinically meaningful difference.  A difference of 2-3 points in the total score of the Quality of Life Index has been associated with significant improvement in overall quality of life, self-image, physical symptoms, and general health in studies assessing change in quality of life.  PHQ-9: Recent Review Flowsheet Data     Depression screen [facility] 2/9 11/24/2020 05/30/2020 05/25/2020 11/23/2019 05/25/2019   Decreased Interest 0 0 0 0 0   Down, Depressed, Hopeless 0 0 0 0 0    PHQ - 2 Score 0 0 0 0 0   Altered sleeping 1 - - - -   Tired, decreased energy 1 - - - -   Change in appetite 0 - - - -   Feeling bad or failure about yourself  0 - - - -   Trouble concentrating 0 - - - -   Moving slowly or fidgety/restless 0 - - - -   Suicidal thoughts 0 - - - -   PHQ-9 Score 2 - - - -   Difficult doing work/chores Not difficult at all - - - -      Interpretation of Total Score  Total Score Depression  Severity:  1-4 = Minimal depression, 5-9 = Mild depression, 10-14 = Moderate depression, 15-19 = Moderately severe depression, 20-27 = Severe depression   Psychosocial Evaluation and Intervention:  Psychosocial Evaluation - 11/24/20 1318       Psychosocial Evaluation & Interventions   Comments Melissa Merritt has no barriers to entering the program. She is ready to satrt and work on her symptom control. Melissa Merritt lives with her husband. Their son and his family is building a house next door and until it is completed they are  living in a camper at [employer].  Son, his wife and 66yrold twins.  Her husband has recently had a stroke, same time she was getting her cancer treatments. She states she does not feel stressed.  She has other children that live out of town.    Expected Outcomes STG: Melissa Merritt be able to attend all scheduled sessions, she will continue to maintain the low stress levels even with all the events happening in her life.  LTG: Melissa Merritt continue to maintain her progression    Continue Psychosocial Services  Follow up required by staff             Psychosocial Re-Evaluation:  Psychosocial Re-Evaluation     Melissa [place] 12/30/20 0952 01/18/21 1010           Psychosocial Re-Evaluation   Current issues with None Identified None Identified      Comments Melissa Prowsis doing well in rehab. It's grandtwins birthday today and they were up early to open presents.  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  She sleeps pretty well over all. She is going to start using her CPAP. Patient reports no issues with their current mental states, sleep, stress, depression or anxiety. Will follow up with patient in a few weeks for any changes.      Expected Outcomes Short: Continue to stay positive Long: Continue take care of self. Short: Continue to exercise regularly to support mental health and notify staff of any changes. Long: maintain mental health and well being through teaching of rehab or prescribed medications independently.      Interventions Encouraged to attend Pulmonary Rehabilitation for the exercise Encouraged to attend Pulmonary Rehabilitation for the exercise      Continue Psychosocial Services  -- Follow up required by staff               Psychosocial Discharge (Final Psychosocial Re-Evaluation):  Psychosocial Re-Evaluation - 01/18/21 1010       Psychosocial Re-Evaluation   Current issues with None Identified    Comments Patient reports no issues with  their current mental states, sleep, stress, depression or anxiety. Will follow up with patient in a few weeks for any changes.    Expected Outcomes Short: Continue to exercise regularly to support mental health and notify staff of any changes. Long: maintain mental health and well being through teaching of rehab or prescribed medications independently.    Interventions Encouraged to attend Pulmonary Rehabilitation for the exercise    Continue Psychosocial Services  Follow up required by staff             Education: Education Goals: Education classes will be provided on a weekly basis, covering required topics. Participant will state understanding/return demonstration of topics presented.  Learning Barriers/Preferences:  Learning Barriers/Preferences - 11/24/20 1306       Learning Barriers/Preferences   Learning Barriers None    Learning Preferences None  General Pulmonary Education Topics:  Infection Prevention: - Provides verbal and written material to individual with discussion of infection control including proper hand washing and proper equipment cleaning during exercise session. Flowsheet Row Pulmonary Rehab from 02/01/2021 in [facility] Cardiac and Pulmonary Rehab  Education need identified 11/24/20  Date 11/24/20  Educator [place]  Instruction Review Code 1- Verbalizes Understanding       Falls Prevention: - Provides verbal and written material to individual with discussion of falls prevention and safety. Flowsheet Row Pulmonary Rehab from 02/01/2021 in [facility] Cardiac and Pulmonary Rehab  Education need identified 11/24/20  Date 11/24/20  Educator [place]  Instruction Review Code 1- Verbalizes Understanding       Chronic Lung Disease Review: - Group verbal instruction with posters, models, PowerPoint presentations and videos,  to review new updates, new respiratory medications, new advancements in procedures and treatments. Providing information on websites and  "800" numbers for continued self-education. Includes information about supplement oxygen, available portable oxygen systems, continuous and intermittent flow rates, oxygen safety, concentrators, and Medicare reimbursement for oxygen. Explanation of Pulmonary Drugs, including class, frequency, complications, importance of spacers, rinsing mouth after steroid MDI's, and proper cleaning methods for nebulizers. Review of basic lung anatomy and physiology related to function, structure, and complications of lung disease. Review of risk factors. Discussion about methods for diagnosing sleep apnea and types of masks and machines for OSA. Includes a review of the use of types of environmental controls: home humidity, furnaces, filters, dust mite/pet prevention, HEPA vacuums. Discussion about weather changes, air quality and the benefits of nasal washing. Instruction on Warning signs, infection symptoms, calling MD promptly, preventive modes, and value of vaccinations. Review of effective airway clearance, coughing and/or vibration techniques. Emphasizing that all should Create an Action Plan. Written material given at graduation. Flowsheet Row Pulmonary Rehab from 02/01/2021 in [facility] Cardiac and Pulmonary Rehab  Date 01/11/21  Educator [facility]  Instruction Review Code 1- Verbalizes Understanding       AED/CPR: - Group verbal and written instruction with the use of models to demonstrate the basic use of the AED with the basic ABC's of resuscitation.    Anatomy and Cardiac Procedures: - Group verbal and visual presentation and models provide information about basic cardiac anatomy and function. Reviews the testing methods done to diagnose heart disease and the outcomes of the test results. Describes the treatment choices: Medical Management, Angioplasty, or Coronary Bypass Surgery for treating various heart conditions including Myocardial Infarction, Angina, Valve Disease, and Cardiac Arrhythmias.  Written material  given at graduation. Flowsheet Row Pulmonary Rehab from 02/01/2021 in [facility] Cardiac and Pulmonary Rehab  Date 12/07/20  Educator SB  Instruction Review Code 1- Verbalizes Understanding       Medication Safety: - Group verbal and visual instruction to review commonly prescribed medications for heart and lung disease. Reviews the medication, class of the drug, and side effects. Includes the steps to properly store meds and maintain the prescription regimen.  Written material given at graduation.   Other: -Provides group and verbal instruction on various topics (see comments)   Knowledge Questionnaire Score:  Knowledge Questionnaire Score - 11/24/20 1602       Knowledge Questionnaire Score   Pre Score 16/18: Oxygen              Core Components/Risk Factors/Patient Goals at Admission:  Personal Goals and Risk Factors at Admission - 11/24/20 1614       Core Components/Risk Factors/Patient Goals on Admission    Weight Management  Yes;Weight Loss    Intervention Weight Management: Develop a combined nutrition and exercise program designed to reach desired caloric intake, while maintaining appropriate intake of nutrient and fiber, sodium and fats, and appropriate energy expenditure required for the weight goal.;Weight Management: Provide education and appropriate resources to help participant work on and attain dietary goals.    Admit Weight 226 lb (102.5 kg)    Goal Weight: Short Term 220 lb (99.8 kg)    Goal Weight: Long Term 180 lb (81.6 kg)    Expected Outcomes Short Term: Continue to assess and modify interventions until short term weight is achieved;Long Term: Adherence to nutrition and physical activity/exercise program aimed toward attainment of established weight goal;Weight Loss: Understanding of general recommendations for a balanced deficit meal plan, which promotes 1-2 lb weight loss per week and includes a negative energy balance of [phone] kcal/d;Understanding  recommendations for meals to include 15-35% energy as protein, 25-35% energy from fat, 35-60% energy from carbohydrates, less than 272m of dietary cholesterol, 20-35 gm of total fiber daily;Understanding of distribution of calorie intake throughout the day with the consumption of 4-5 meals/snacks    Increase knowledge of respiratory medications and ability to use respiratory devices properly  Yes    Intervention Provide education and demonstration as needed of appropriate use of medications, inhalers, and oxygen therapy.    Expected Outcomes Short Term: Achieves understanding of medications use. Understands that oxygen is a medication prescribed by physician. Demonstrates appropriate use of inhaler and oxygen therapy.;Long Term: Maintain appropriate use of medications, inhalers, and oxygen therapy.    Hypertension Yes    Intervention Provide education on lifestyle modifcations including regular physical activity/exercise, weight management, moderate sodium restriction and increased consumption of fresh fruit, vegetables, and low fat dairy, alcohol moderation, and smoking cessation.;Monitor prescription use compliance.    Expected Outcomes Short Term: Continued assessment and intervention until BP is < 140/924mHG in hypertensive participants. < 130/8040mG in hypertensive participants with diabetes, heart failure or chronic kidney disease.;Long Term: Maintenance of blood pressure at goal levels.    Lipids Yes    Intervention Provide education and support for participant on nutrition & aerobic/resistive exercise along with prescribed medications to achieve LDL <19m62mDL >40mg76m Expected Outcomes Short Term: Participant states understanding of desired cholesterol values and is compliant with medications prescribed. Participant is following exercise prescription and nutrition guidelines.;Long Term: Cholesterol controlled with medications as prescribed, with individualized exercise RX and with personalized  nutrition plan. Value goals: LDL < 19mg,92m > 40 mg.             Education:Diabetes - Individual verbal and written instruction to review signs/symptoms of diabetes, desired ranges of glucose level fasting, after meals and with exercise. Acknowledge that pre and post exercise glucose checks will be done for 3 sessions at entry of program.   Know Your Numbers and Heart Failure: - Group verbal and visual instruction to discuss disease risk factors for cardiac and pulmonary disease and treatment options.  Reviews associated critical values for Overweight/Obesity, Hypertension, Cholesterol, and Diabetes.  Discusses basics of heart failure: signs/symptoms and treatments.  Introduces Heart Failure Zone chart for action plan for heart failure.  Written material given at graduation.   Core Components/Risk Factors/Patient Goals Review:   Goals and Risk Factor Review     Row Name 12/30/20 0957 01/18/21 1002           Core Components/Risk Factors/Patient Goals Review   Personal Goals Review Weight Management/Obesity;Increase  knowledge of respiratory medications and ability to use respiratory devices properly.;Improve shortness of breath with ADL's;Hypertension;Lipids Improve shortness of breath with ADL's      Review Melissa Merritt is doing well in rehab. Her weight goes up and down and she is using her dieruetic.  She is doing better with her breathing and is now on a CPAP but hasnt't started yet.  Continue to montior blood pressures closely.  She loves her inhalers and is good about using them!! Spoke to patient about their shortness of breath and what they can do to improve. Patient has been informed of breathing techniques when starting the program. Patient is informed to tell staff if they have had any med changes and that certain meds they are taking or not taking can be causing shortness of breath.      Expected Outcomes Short: Continue to work on weight loss  Long: Conitnue to monitor risk factors.  Short: Attend [facility] regularly to improve shortness of breath with ADL's. Long: maintain independence with ADL's               Core Components/Risk Factors/Patient Goals at Discharge (Final Review):   Goals and Risk Factor Review - 01/18/21 1002       Core Components/Risk Factors/Patient Goals Review   Personal Goals Review Improve shortness of breath with ADL's    Review Spoke to patient about their shortness of breath and what they can do to improve. Patient has been informed of breathing techniques when starting the program. Patient is informed to tell staff if they have had any med changes and that certain meds they are taking or not taking can be causing shortness of breath.    Expected Outcomes Short: Attend [facility] regularly to improve shortness of breath with ADL's. Long: maintain independence with ADL's             ITP Comments:  ITP Comments     Row Name 11/24/20 1316 11/24/20 1601 11/30/20 1012 12/14/20 0620 01/04/21 0959   ITP Comments Orientation complete  joining Pulmonary Rehab for Asthma Documentation of diagnosis can be found in [place] 10/05/2020 Completed 6MWT and gym orientation. Initial ITP created and sent for review to Dr. Ottie Glazier, Medical Director. First full day of exercise!  Patient was oriented to gym and equipment including functions, settings, policies, and procedures.  Patient's individual exercise prescription and treatment plan were reviewed.  All starting workloads were established based on the results of the 6 minute walk test done at initial orientation visit.  The plan for exercise progression was also introduced and progression will be customized based on patient's performance and goals. 30 Day review completed. Medical Director ITP review done, changes made as directed, and signed approval by Medical Director. Completed initial nutrition consultaion    Row Name 01/11/21 0907 02/08/21 0702         ITP Comments 30 day review completed.  ITP sent to Dr. Zetta Bills, Medical Director of Pulmonary Rehab. Continue with ITP unless changes are made by physician. 30 Day review completed. Medical Director ITP review done, changes made as directed, and signed approval by Medical Director.               Comments:  30 Day review completed. Medical Director ITP review done, changes made as directed, and signed approval by Medical Director.

## 2021-02-09 DIAGNOSIS — Z23 Encounter for immunization: Secondary | ICD-10-CM | POA: Diagnosis not present

## 2021-02-10 ENCOUNTER — Encounter: Payer: Medicare Other | Admitting: *Deleted

## 2021-02-10 ENCOUNTER — Other Ambulatory Visit: Payer: Self-pay

## 2021-02-10 DIAGNOSIS — J4530 Mild persistent asthma, uncomplicated: Secondary | ICD-10-CM | POA: Diagnosis not present

## 2021-02-10 NOTE — Progress Notes (Signed)
Daily Session Note  Patient Details  Name: Melissa Merritt MRN: [id number] Date of Birth: 10/24/1950 Referring Provider:   Flowsheet Row Pulmonary Rehab from 11/24/2020 in [facility] and Pul[facility] Rehab  Referring Provider Aleskerov, Fuad MD       Encounter Date: 02/10/2021  Check In:  Session Check In - 02/10/21 1037       Check-In   Supervising physician immediately available to respond to emergencies See telemetry face sheet for immediately available ER MD    Location [facility] & Pul[facility] Re[facility]    Staff Present Susanne Bice, RN, BSN, CCRP;Jessica Hawkins, MA, RCEP, CCRP, CCET;Joseph Hood, RCP,RRT,BSRT    Virtual Visit No    Medication changes reported     No    Fall or balance concerns reported    No    Warm-up and Cool-down Performed on first and last piece of equipment    Resistance Training Performed Yes    VAD Patient? No    PAD/SET Patient? No      Pain Assessment   Currently in Pain? No/denies                Social History   Tobacco Use  Smoking Status Former   Types: Cigarettes   Quit date: 07/09/1979   Years since quitting: 41.6  Smokeless Tobacco Never    Goals Met:  Proper associated with RPD/PD & O2 Sat Independence with exercise equipment Exercise tolerated well No report of concerns or symptoms today  Goals Unmet:  Not Applicable  Comments: Pt able to follow exercise prescription today without complaint.  Will continue to monitor for progression.    Dr. Mark Miller is Medical Director for [facility].  Dr. Fuad Aleskerov is Medical Director for [facility]. 

## 2021-02-13 ENCOUNTER — Encounter: Payer: Medicare Other | Admitting: *Deleted

## 2021-02-13 ENCOUNTER — Other Ambulatory Visit: Payer: Self-pay

## 2021-02-13 DIAGNOSIS — J4530 Mild persistent asthma, uncomplicated: Secondary | ICD-10-CM

## 2021-02-13 NOTE — Progress Notes (Signed)
Daily Session Note  Patient Details  Name: Melissa Merritt MRN: [id number] Date of Birth: 01-15-1951 Referring Provider:   Flowsheet Row Pulmonary Rehab from 11/24/2020 in [facility] Cardiac and Pulmonary Rehab  Referring Provider Ottie Glazier MD       Encounter Date: 02/13/2021  Check In:  Session Check In - 02/13/21 1025       Check-In   Supervising physician immediately available to respond to emergencies See telemetry face sheet for immediately available ER MD    Location [facility]    Staff Present Renita Papa, RN Moises Blood, BS, ACSM CEP, Exercise Physiologist;Amanda Oletta Darter, [place], ACSM CEP, Exercise Physiologist    Virtual Visit No    Medication changes reported     No    Fall or balance concerns reported    No    Warm-up and Cool-down Performed on first and last piece of equipment    Resistance Training Performed Yes    VAD Patient? No    PAD/SET Patient? No      Pain Assessment   Currently in Pain? No/denies                Social History   Tobacco Use  Smoking Status Former   Types: Cigarettes   Quit date: 07/09/1979   Years since quitting: 41.6  Smokeless Tobacco Never    Goals Met:  Independence with exercise equipment Exercise tolerated well No report of concerns or symptoms today Strength training completed today  Goals Unmet:  Not Applicable  Comments: Pt able to follow exercise prescription today without complaint.  Will continue to monitor for progression.    Dr. Emily Filbert is Medical Director for [facility].  Dr. Ottie Glazier is Medical Director for [facility] Pulmonary Rehabilitation.

## 2021-02-14 ENCOUNTER — Encounter: Payer: Self-pay | Admitting: Hematology and Oncology

## 2021-02-14 ENCOUNTER — Other Ambulatory Visit (HOSPITAL_COMMUNITY): Payer: Self-pay

## 2021-02-14 ENCOUNTER — Other Ambulatory Visit: Payer: Self-pay

## 2021-02-14 ENCOUNTER — Other Ambulatory Visit: Payer: Self-pay | Admitting: *Deleted

## 2021-02-14 ENCOUNTER — Inpatient Hospital Stay: Payer: Medicare Other

## 2021-02-14 ENCOUNTER — Inpatient Hospital Stay: Payer: Medicare Other | Attending: Internal Medicine | Admitting: Internal Medicine

## 2021-02-14 VITALS — BP 157/79 | HR 71 | Temp 98.0°F | Resp 18 | Wt 227.0 lb

## 2021-02-14 DIAGNOSIS — Z79811 Long term (current) use of aromatase inhibitors: Secondary | ICD-10-CM | POA: Insufficient documentation

## 2021-02-14 DIAGNOSIS — E538 Deficiency of other specified B group vitamins: Secondary | ICD-10-CM | POA: Diagnosis not present

## 2021-02-14 DIAGNOSIS — Z87891 Personal history of nicotine dependence: Secondary | ICD-10-CM | POA: Diagnosis not present

## 2021-02-14 DIAGNOSIS — Z780 Asymptomatic menopausal state: Secondary | ICD-10-CM

## 2021-02-14 DIAGNOSIS — Z801 Family history of malignant neoplasm of trachea, bronchus and lung: Secondary | ICD-10-CM | POA: Insufficient documentation

## 2021-02-14 DIAGNOSIS — C50811 Malignant neoplasm of overlapping sites of right female breast: Secondary | ICD-10-CM | POA: Diagnosis not present

## 2021-02-14 DIAGNOSIS — Z923 Personal history of irradiation: Secondary | ICD-10-CM | POA: Insufficient documentation

## 2021-02-14 DIAGNOSIS — M8580 Other specified disorders of bone density and structure, unspecified site: Secondary | ICD-10-CM | POA: Insufficient documentation

## 2021-02-14 DIAGNOSIS — M7989 Other specified soft tissue disorders: Secondary | ICD-10-CM | POA: Insufficient documentation

## 2021-02-14 DIAGNOSIS — C50911 Malignant neoplasm of unspecified site of right female breast: Secondary | ICD-10-CM | POA: Insufficient documentation

## 2021-02-14 DIAGNOSIS — I110 Hypertensive heart disease with heart failure: Secondary | ICD-10-CM | POA: Insufficient documentation

## 2021-02-14 DIAGNOSIS — D649 Anemia, unspecified: Secondary | ICD-10-CM

## 2021-02-14 DIAGNOSIS — R0602 Shortness of breath: Secondary | ICD-10-CM | POA: Diagnosis not present

## 2021-02-14 DIAGNOSIS — D509 Iron deficiency anemia, unspecified: Secondary | ICD-10-CM | POA: Diagnosis not present

## 2021-02-14 DIAGNOSIS — Z170 Estrogen receptor positive status [ER+]: Secondary | ICD-10-CM | POA: Insufficient documentation

## 2021-02-14 DIAGNOSIS — I509 Heart failure, unspecified: Secondary | ICD-10-CM | POA: Insufficient documentation

## 2021-02-14 DIAGNOSIS — Z853 Personal history of malignant neoplasm of breast: Secondary | ICD-10-CM | POA: Diagnosis not present

## 2021-02-14 DIAGNOSIS — M8588 Other specified disorders of bone density and structure, other site: Secondary | ICD-10-CM

## 2021-02-14 DIAGNOSIS — Z803 Family history of malignant neoplasm of breast: Secondary | ICD-10-CM | POA: Insufficient documentation

## 2021-02-14 LAB — BASIC METABOLIC PANEL
Anion gap: 8 (ref 5–15)
BUN: 18 mg/dL (ref 8–23)
CO2: 28 mmol/L (ref 22–32)
Calcium: 9.7 mg/dL (ref 8.9–10.3)
Chloride: 102 mmol/L (ref 98–111)
Creatinine, Ser: 0.81 mg/dL (ref 0.44–1.00)
GFR, Estimated: >60 mL/min (ref >60)
Glucose, Bld: 112 mg/dL — ABNORMAL HIGH (ref 70–99)
Potassium: 4.2 mmol/L (ref 3.5–5.1)
Sodium: 138 mmol/L (ref 135–145)

## 2021-02-14 MED ORDER — DENOSUMAB 60 MG/ML ~~LOC~~ SOSY
60.0000 mg | PREFILLED_SYRINGE | Freq: Once | SUBCUTANEOUS | Status: AC
  Administered 2021-02-14: 60 mg via SUBCUTANEOUS
  Filled 2021-02-14: qty 1

## 2021-02-14 NOTE — Progress Notes (Signed)
Patient states that she has no questions or concerns today

## 2021-02-14 NOTE — Progress Notes (Signed)
[facility] NOTE  Patient Care Team: Venita Lick, NP as PCP - General (Nurse Practitioner) Idelle Leech, OD (Optometry) Isaias Cowman, MD as Consulting Physician (Cardiology) Manya Silvas, MD (Inactive) (Gastroenterology) Lucky Cowboy Erskine Squibb, MD as Referring Physician (Vascular Surgery) Clabe Seal, PA-C Jannet Mantis, MD (Dermatology) Theodore Demark, RN as Oncology Nurse Navigator Lequita Asal, MD (Inactive) as Referring Physician (Hematology and Oncology) Benjamine Sprague, DO as Consulting Physician (Surgery) Noreene Filbert, MD as Radiation Oncologist (Radiation Oncology)  CHIEF COMPLAINTS/PURPOSE OF CONSULTATION: right breast cancer   #  Oncology History Overview Note  Bilateral diagnostic mammogram on 07/08/2019 revealed persistent architectural distortion involving the upper outer quadrant of the right breast without sonographic correlate.  There was no pathologic right axillary lymphadenopathy.  No mammographic or sonographic evidence of malignancy in the left breast.  CA27.29 was 19.0 (07/24/19) at diagnosis.   Patient underwent stereotactic biopsy on 07/17/2019.  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  Intermediate nuclear grade with central necrosis.  Consistent with stage Ia right breast cancer.  She underwent partial mastectomy and right axillary sentinel lymph node biopsy on 08/13/2019.  Pathology revealed grade 1 invasive mammary carcinoma, no special type, and intermediate grade DCIS.  There is atypical lobular hyperplasia with ductal involvement.  Additional posterior margin revealed no evidence of residual carcinoma or DCIS.  Closest margin was > 10 mm and 2 mm for DCIS.  1 lymph node was negative for malignancy.  Tumor was ER/PR positive and HER-2/neu negative.  Pathologic stage was pT1a pN0 (sn).   She received 5040 cGy to her right breast and 1400 cGy boost to her scar from 09/28/2019-11/17/2019.   She began tamoxifen on 11/27/2019.  Tamoxifen discontinued in December 2021 due to intolerable side effects.  She was switched to Femara, started 04/12/20.  stopped in 9th May 2022 sec to leg swelling.  # AUG 2022- STARTED AROMASIN  Baseline bone density scan in April 2021 consistent with osteopenia with T score of -1.5. Plan for prolia.    History of breast cancer  07/24/2019 Initial Diagnosis   Malignant neoplasm of right breast in female, estrogen receptor positive ([place])   Carcinoma of overlapping sites of right breast in female, estrogen receptor positive ([place])  02/14/2021 Initial Diagnosis   Carcinoma of overlapping sites of right breast in female, estrogen receptor positive ([place])      HISTORY OF PRESENTING ILLNESS:  Melissa Merritt 70 y.o.  female with stage I ER/PR positive HER2 negative breast cancer is here for follow-up.  Patient is currently on Aromasin for the last 2 months.  Patient denies any worsening hot flashes or swelling in the legs.  Appetite is good.  No weight loss.  Review of Systems  Constitutional:  Positive for malaise/fatigue. Negative for chills, diaphoresis and fever.  HENT:  Negative for nosebleeds and sore throat.   Eyes:  Negative for double vision.  Respiratory:  Negative for cough, hemoptysis, sputum production, shortness of breath and wheezing.   Cardiovascular:  Negative for chest pain, palpitations, orthopnea and leg swelling.  Gastrointestinal:  Negative for abdominal pain, blood in stool, constipation, diarrhea, heartburn, melena, nausea and vomiting.  Genitourinary:  Negative for dysuria, frequency and urgency.  Musculoskeletal:  Positive for back pain and joint pain.  Skin: Negative.  Negative for itching and rash.  Neurological:  Negative for dizziness, tingling, focal weakness, weakness and headaches.  Endo/Heme/Allergies:  Does not bruise/bleed easily.  Psychiatric/Behavioral:  Negative for depression. The patient is not nervous/anxious and  does not have insomnia.     MEDICAL HISTORY:  Past Medical History:  Diagnosis Date   Benign tumor of lung    Breast cancer ([place])    Cancer ([place])    right breast   CHF (congestive heart failure) (HCC)    Dyspnea    GERD (gastroesophageal reflux disease)    Hyperlipidemia    Hypertension    Insomnia    Osteopenia    Personal history of radiation therapy    Plantar fasciitis    PONV (postoperative nausea and vomiting)    vomiting 2 days after lung surgery   S/P pneumonectomy    right lower lobe   Urinary incontinence     SURGICAL HISTORY: Past Surgical History:  Procedure Laterality Date   BREAST BIOPSY Right 07/17/2019   Affirm bx-"Ribbon" clip-path pending   BREAST LUMPECTOMY     bronchoscopy     CATARACT EXTRACTION, BILATERAL     Left- 11/21/15, right- 12/23/15   CHOLECYSTECTOMY     CYSTECTOMY  11/03/09   cheek   dilation and curretage     EYE SURGERY Bilateral    cataract   FOOT SURGERY Bilateral 2010   LOBECTOMY     LUNG SURGERY Right    PART MASTECTOMY,RADIO FREQUENCY LOCALIZER,AXILLARY SENTINEL NODE BIOPSY Right 08/13/2019   Procedure: PART MASTECTOMY,RADIO FREQUENCY LOCALIZER,AXILLARY SENTINEL NODE BIOPSY;  Surgeon: Benjamine Sprague, DO;  Location: [facility]S;  Service: General;  Laterality: Right;   UPPER GI ENDOSCOPY  2005    SOCIAL HISTORY: Social History   Socioeconomic History   Marital status: Married    Spouse name: Not on file   Number of children: Not on file   Years of education: Not on file   Highest education level: Some college, no degree  Occupational History   Occupation: retired   Tobacco Use   Smoking status: Former    Types: Cigarettes    Quit date: 07/09/1979    Years since quitting: 41.6   Smokeless tobacco: Never  Vaping Use   Vaping Use: Never used  Substance and Sexual Activity   Alcohol use: No   Drug use: No   Sexual activity: Yes  Other Topics Concern   Not on file  Social History Narrative   Not on file   Social  Determinants of Health   Financial Resource Strain: Low Risk    Difficulty of Paying Living Expenses: Not hard at all  Food Insecurity: No Food Insecurity   Worried About Charity fundraiser in the Last Year: Never true   Arboriculturist in the Last Year: Never true  Transportation Needs: No Transportation Needs   Lack of Transportation (Medical): No   Lack of Transportation (Non-Medical): No  Physical Activity: Inactive   Days of Exercise per Week: 0 days   Minutes of Exercise per Session: 0 min  Stress: No Stress Concern Present   Feeling of Stress : Not at all  Social Connections: Not on file  Intimate Partner Violence: Not on file    FAMILY HISTORY: Family History  Problem Relation Age of Onset   COPD Mother    Hypertension Mother    Emphysema Mother    Osteoporosis Mother    Heart disease Father    Hyperlipidemia Sister    Hyperlipidemia Brother    Heart disease Maternal Grandmother    Heart disease Maternal Grandfather    Heart disease Paternal Grandmother  Stroke Paternal Grandmother    Heart disease Paternal Grandfather    Stroke Paternal Grandfather    Cancer Maternal Aunt        breast and lung   Osteoporosis Maternal Aunt    Aortic aneurysm Maternal Aunt    Breast cancer Maternal Aunt 76    ALLERGIES:  is allergic to contrast media [iodinated diagnostic agents], other, ace inhibitors, hctz [hydrochlorothiazide], and keflex [cephalexin].  MEDICATIONS:  Current Outpatient Medications  Medication Sig Dispense Refill   amLODipine (NORVASC) 5 MG tablet Take 1 tablet (5 mg total) by mouth daily. 90 tablet 4   aspirin 81 MG tablet Take 81 mg by mouth daily.     Biotin 5000 MCG TABS Take 5,000 mcg by mouth 2 (two) times daily.     Calcium Carb-Cholecalciferol (CALCIUM-VITAMIN D) 600-400 MG-UNIT TABS Take 1 tablet by mouth 2 (two) times daily.     cholecalciferol (VITAMIN D3) 25 MCG (1000 UNIT) tablet Take 1,000 Units by mouth in the morning and at bedtime.      Cyanocobalamin (B-12) 2500 MCG TABS Take 2,500 mcg by mouth daily.     denosumab (PROLIA) 60 MG/ML SOSY injection Inject 60 mg into the skin every 6 (six) months.     exemestane (AROMASIN) 25 MG tablet Take 1 tablet (25 mg total) by mouth daily after breakfast. 30 tablet 3   ferrous sulfate 220 (44 Fe) MG/5ML solution Take 220 mg by mouth daily.     furosemide (LASIX) 20 MG tablet Take by mouth.     ibuprofen (ADVIL) 200 MG tablet Take 400 mg by mouth every 6 (six) hours as needed for moderate pain.      metoprolol succinate (TOPROL-XL) 25 MG 24 hr tablet Take 1 tablet (25 mg total) by mouth daily. 90 tablet 4   pantoprazole (PROTONIX) 40 MG tablet Take 1 tablet (40 mg total) by mouth 2 (two) times daily. 180 tablet 4   Red Yeast Rice Extract (RED YEAST RICE PO) Take 1 tablet by mouth in the morning and at bedtime.     St Johns Wort 300 MG CAPS Take 300 mg by mouth 2 (two) times daily.     TRELEGY ELLIPTA 100-62.5-25 MCG/INH AEPB Inhale 1 puff into the lungs daily.     valsartan (DIOVAN) 160 MG tablet Take 1 tablet (160 mg total) by mouth daily. 90 tablet 4   No current facility-administered medications for this visit.   Facility-Administered Medications Ordered in Other Visits  Medication Dose Route Frequency Provider Last Rate Last Admin   denosumab (PROLIA) injection 60 mg  60 mg Subcutaneous Once Nolon Stalls C, MD          .  PHYSICAL EXAMINATION: ECOG PERFORMANCE STATUS: 0 - Asymptomatic  Vitals:   02/14/21 1011  BP: (!) 157/79  Pulse: 71  Resp: 18  Temp: 98 F (36.7 C)  SpO2: 97%    Filed Weights   02/14/21 1005  Weight: 227 lb (103 kg)     Physical Exam Vitals and nursing note reviewed.  Constitutional:      Comments: Alone.  Ambulating independently.      HENT:     Head: Normocephalic and atraumatic.     Mouth/Throat:     Pharynx: Oropharynx is clear.  Eyes:     Extraocular Movements: Extraocular movements intact.     Pupils: Pupils are equal,  round, and reactive to light.  Cardiovascular:     Rate and Rhythm: Normal rate and regular rhythm.  Pulmonary:     Comments: Decreased breath sounds bilaterally.  Abdominal:     Palpations: Abdomen is soft.  Musculoskeletal:        General: Normal range of motion.     Cervical back: Normal range of motion.  Skin:    General: Skin is warm.  Neurological:     General: No focal deficit present.     Mental Status: She is alert and oriented to person, place, and time.  Psychiatric:        Behavior: Behavior normal.        Judgment: Judgment normal.     LABORATORY DATA:  I have reviewed the data as listed Lab Results  Component Value Date   WBC 6.7 11/15/2020   HGB 12.4 11/15/2020   HCT 36.5 11/15/2020   MCV 88.0 11/15/2020   PLT 229 11/15/2020   Recent Labs    05/10/20 0922 08/08/20 1103 08/15/20 0830 11/15/20 0859 11/22/20 0956 02/14/21 1107  NA  --  138 138 137 142 138  K  --  3.8 3.6 3.8 4.7 4.2  CL  --  101 106 102 101 102  CO2  --  '28 25 29 24 28  ' GLUCOSE  --  137* 169* 126* 89 112*  BUN  --  '20 16 17 19 18  ' CREATININE  --  0.85 0.79 0.86 0.91 0.81  CALCIUM  --  9.7 9.2 9.5 10.1 9.7  GFRNONAA  --  >60 >60 >60  --  >60  PROT 7.1 7.4  --  7.4 6.6  --   ALBUMIN 3.6 4.0  --  3.8 4.5  --   AST 21 27  --  21 19  --   ALT 15 24  --  20 18  --   ALKPHOS 72 76  --  82 100  --   BILITOT 0.4 0.3  --  0.5 <0.2  --   BILIDIR <0.1  --   --   --   --   --   IBILI NOT CALCULATED  --   --   --   --   --     RADIOGRAPHIC STUDIES: I have personally reviewed the radiological images as listed and agreed with the findings in the report. No results found.  ASSESSMENT & PLAN:   History of breast cancer     Carcinoma of overlapping sites of right breast in female, estrogen receptor positive ([place]) #Stage Ia right breast cancer status postlumpectomy and sentinel lymph node biopsy.  Pathology revealed a 4 mm grade 1 tumor, ER/PR positive, HER2/neu negative.  Completed  adjuvant radiation therapy on 11/17/2019.  Started tamoxifen on 11/27/2019.  Discontinued in December secondary to multiple complaints.  Initiated letrozole on 04/12/20; stopped in 9th May 2022 sec to leg swelling.  # Patient currently on Aromasin for the last 2 months-tolerating well.  No worsening joint pains.  No worsening hot flashes.  Will order mammogram for April 2023.  #  Osteopenia-bone density scan in April 2021 consistent with osteopenia.   She has received dental clearance from Dr. Gloriann Loan.  Continue calcium 1200 mg and vitamin D 1000 IU daily along with weightbearing exercise as tolerated.  Plan to repeat bone density in April 2023.  Ordered today. prolia today.    #   Iron deficiency anemia-history of iron deficiency anemia.  On oral iron.  Not anemic on most recent blood work.  Iron studies pending today.  Continue oral iron in the interim.   #  B12 deficiency.  History of B12 deficiency.  Currently on oral B12 3 times a week-stable.               # DISPOSITION: will need BMD/Mammogram  # Prolia today-  # follow up- in 6 months- MD; cbc/cmp; ca27-29; b12; prolia- Dr.B    All questions were answered. The patient knows to call the clinic with any problems, questions or concerns.    Cammie Sickle, MD 02/14/2021 11:33 AM

## 2021-02-14 NOTE — Assessment & Plan Note (Addendum)
#  Stage Ia right breast cancer status postlumpectomy and sentinel lymph node biopsy.  Pathology revealed a 4 mm grade 1 tumor, ER/PR positive, HER2/neu negative.  Completed adjuvant radiation therapy on 11/17/2019.  Started tamoxifen on 11/27/2019.  Discontinued in December secondary to multiple complaints.  Initiated letrozole on 04/12/20; stopped in 9th May 2022 sec to leg swelling.  # Patient currently on Aromasin for the last 2 months-tolerating well.  No worsening joint pains.  No worsening hot flashes.  Will order mammogram for April 2023.  #  Osteopenia-bone density scan in April 2021 consistent with osteopenia.   She has received dental clearance from Dr. Gloriann Loan.  Continue calcium 1200 mg and vitamin D 1000 IU daily along with weightbearing exercise as tolerated.  Plan to repeat bone density in April 2023.  Ordered today. prolia today.   #   Iron deficiency anemia-history of iron deficiency anemia.  On oral iron.  Not anemic on most recent blood work.  Iron studies pending today.  Continue oral iron in the interim.  # B12 deficiency.  History of B12 deficiency.  Currently on oral B12 3 times a week-stable.               # DISPOSITION: will need BMD/Mammogram  # Prolia today-  # follow up- in 6 months- MD; cbc/cmp; ca27-29; b12; prolia- Dr.B

## 2021-02-15 DIAGNOSIS — J4530 Mild persistent asthma, uncomplicated: Secondary | ICD-10-CM

## 2021-02-15 NOTE — Progress Notes (Signed)
Daily Session Note  Patient Details  Name: Melissa Merritt MRN: [id number] Date of Birth: Apr 18, 1950 Referring Provider:   Flowsheet Row Pulmonary Rehab from 11/24/2020 in [facility] Cardiac and Pulmonary Rehab  Referring Provider Ottie Glazier MD       Encounter Date: 02/15/2021  Check In:  Session Check In - 02/15/21 1007       Check-In   Supervising physician immediately available to respond to emergencies See telemetry face sheet for immediately available ER MD    Location [facility]    Staff Present Birdie Sons, MPA, RN;Jessica [place], MA, RCEP, CCRP, CCET;Joseph [place], [place]    Virtual Visit No    Medication changes reported     Yes    Comments prolia injection    Fall or balance concerns reported    No    Tobacco Cessation No Change    Warm-up and Cool-down Performed on first and last piece of equipment    Resistance Training Performed Yes    VAD Patient? No    PAD/SET Patient? No      Pain Assessment   Currently in Pain? No/denies                Social History   Tobacco Use  Smoking Status Former   Types: Cigarettes   Quit date: 07/09/1979   Years since quitting: 41.6  Smokeless Tobacco Never    Goals Met:  Independence with exercise equipment Exercise tolerated well No report of concerns or symptoms today Strength training completed today  Goals Unmet:  Not Applicable  Comments: Pt able to follow exercise prescription today without complaint.  Will continue to monitor for progression.    Dr. Emily Filbert is Medical Director for [facility].  Dr. Ottie Glazier is Medical Director for [facility] Pulmonary Rehabilitation.

## 2021-02-16 DIAGNOSIS — Z20822 Contact with and (suspected) exposure to covid-19: Secondary | ICD-10-CM | POA: Diagnosis not present

## 2021-02-17 ENCOUNTER — Other Ambulatory Visit: Payer: Self-pay

## 2021-02-17 ENCOUNTER — Encounter: Payer: Medicare Other | Admitting: *Deleted

## 2021-02-17 DIAGNOSIS — J4530 Mild persistent asthma, uncomplicated: Secondary | ICD-10-CM

## 2021-02-17 NOTE — Progress Notes (Signed)
Daily Session Note  Patient Details  Name: Melissa Merritt MRN: [id number] Date of Birth: 1950/10/08 Referring Provider:   Flowsheet Row Pulmonary Rehab from 11/24/2020 in [facility] Cardiac and Pulmonary Rehab  Referring Provider Ottie Glazier MD       Encounter Date: 02/17/2021  Check In:  Session Check In - 02/17/21 0950       Check-In   Supervising physician immediately available to respond to emergencies See telemetry face sheet for immediately available ER MD    Location [facility]    Staff Present Renita Papa, RN BSN;Joseph [place], RCP,RRT,BSRT;Laureen [place], [place], RRT, CPFT    Virtual Visit No    Medication changes reported     No    Fall or balance concerns reported    No    Warm-up and Cool-down Performed on first and last piece of equipment    Resistance Training Performed Yes    VAD Patient? No    PAD/SET Patient? No      Pain Assessment   Currently in Pain? No/denies                Social History   Tobacco Use  Smoking Status Former   Types: Cigarettes   Quit date: 07/09/1979   Years since quitting: 41.6  Smokeless Tobacco Never    Goals Met:  Independence with exercise equipment Exercise tolerated well No report of concerns or symptoms today Strength training completed today  Goals Unmet:  Not Applicable  Comments: Pt able to follow exercise prescription today without complaint.  Will continue to monitor for progression.    Dr. Emily Filbert is Medical Director for [facility].  Dr. Ottie Glazier is Medical Director for [facility] Pulmonary Rehabilitation.

## 2021-02-20 ENCOUNTER — Other Ambulatory Visit: Payer: Self-pay

## 2021-02-20 ENCOUNTER — Encounter: Payer: Medicare Other | Admitting: *Deleted

## 2021-02-20 DIAGNOSIS — J4530 Mild persistent asthma, uncomplicated: Secondary | ICD-10-CM | POA: Diagnosis not present

## 2021-02-20 NOTE — Progress Notes (Signed)
Daily Session Note  Patient Details  Name: Melissa Merritt MRN: [id number] Date of Birth: 1950-10-26 Referring Provider:   Flowsheet Row Pulmonary Rehab from 11/24/2020 in [facility] Cardiac and Pulmonary Rehab  Referring Provider Ottie Glazier MD       Encounter Date: 02/20/2021  Check In:  Session Check In - 02/20/21 1014       Check-In   Supervising physician immediately available to respond to emergencies See telemetry face sheet for immediately available ER MD    Location [facility]    Staff Present Renita Papa, RN Moises Blood, BS, ACSM CEP, Exercise Physiologist;Amanda Oletta Darter, [place], ACSM CEP, Exercise Physiologist    Virtual Visit No    Medication changes reported     No    Fall or balance concerns reported    No    Warm-up and Cool-down Performed on first and last piece of equipment    Resistance Training Performed Yes    VAD Patient? No    PAD/SET Patient? No      Pain Assessment   Currently in Pain? No/denies                Social History   Tobacco Use  Smoking Status Former   Types: Cigarettes   Quit date: 07/09/1979   Years since quitting: 41.6  Smokeless Tobacco Never    Goals Met:  Independence with exercise equipment Exercise tolerated well No report of concerns or symptoms today Strength training completed today  Goals Unmet:  Not Applicable  Comments: Pt able to follow exercise prescription today without complaint.  Will continue to monitor for progression.    Dr. Emily Filbert is Medical Director for [facility].  Dr. Ottie Glazier is Medical Director for [facility] Pulmonary Rehabilitation.

## 2021-02-22 ENCOUNTER — Other Ambulatory Visit: Payer: Self-pay

## 2021-02-22 DIAGNOSIS — J4530 Mild persistent asthma, uncomplicated: Secondary | ICD-10-CM

## 2021-02-22 NOTE — Progress Notes (Signed)
Daily Session Note  Patient Details  Name: Melissa Merritt MRN: [id number] Date of Birth: 28-Jan-1951 Referring Provider:   Flowsheet Row Pulmonary Rehab from 11/24/2020 in [facility] Cardiac and Pulmonary Rehab  Referring Provider Ottie Glazier MD       Encounter Date: 02/22/2021  Check In:  Session Check In - 02/22/21 0949       Check-In   Supervising physician immediately available to respond to emergencies See telemetry face sheet for immediately available ER MD    Location [facility]-Cardiac & Pulmonary Rehab    Staff Present Birdie Sons, MPA, Elveria Rising, BA, ACSM CEP, Exercise Physiologist;Joseph Tessie Fass, [place]    Virtual Visit No    Medication changes reported     No    Fall or balance concerns reported    No    Tobacco Cessation No Change    Warm-up and Cool-down Performed on first and last piece of equipment    Resistance Training Performed Yes    VAD Patient? No    PAD/SET Patient? No      Pain Assessment   Currently in Pain? No/denies                Social History   Tobacco Use  Smoking Status Former   Types: Cigarettes   Quit date: 07/09/1979   Years since quitting: 41.6  Smokeless Tobacco Never    Goals Met:  Independence with exercise equipment Exercise tolerated well No report of concerns or symptoms today Strength training completed today  Goals Unmet:  Not Applicable  Comments: Pt able to follow exercise prescription today without complaint.  Will continue to monitor for progression.    Dr. Emily Filbert is Medical Director for [place].  Dr. Ottie Glazier is Medical Director for [facility] Pulmonary Rehabilitation.

## 2021-02-24 ENCOUNTER — Encounter: Payer: Medicare Other | Admitting: *Deleted

## 2021-02-24 ENCOUNTER — Other Ambulatory Visit: Payer: Self-pay

## 2021-02-24 DIAGNOSIS — J4530 Mild persistent asthma, uncomplicated: Secondary | ICD-10-CM

## 2021-02-24 NOTE — Progress Notes (Signed)
Daily Session Note  Patient Details  Name: Melissa Merritt MRN: [id number] Date of Birth: November 24, 1950 Referring Provider:   April Manson Pulmonary Rehab from 11/24/2020 in [facility] Cardiac and Pulmonary Rehab  Referring Provider Ottie Glazier MD       Encounter Date: 02/24/2021  Check In:  Session Check In - 02/24/21 1001       Check-In   Supervising physician immediately available to respond to emergencies See telemetry face sheet for immediately available ER MD    Location [facility]-Cardiac & Pulmonary Rehab    Staff Present Renita Papa, RN BSN;Joseph Dexter, RCP,RRT,BSRT;Jessica [place], [place], RCEP, CCRP, CCET    Virtual Visit No    Medication changes reported     No    Fall or balance concerns reported    No    Warm-up and Cool-down Performed on first and last piece of equipment    Resistance Training Performed Yes    VAD Patient? No    PAD/SET Patient? No      Pain Assessment   Currently in Pain? No/denies                Social History   Tobacco Use  Smoking Status Former   Types: Cigarettes   Quit date: 07/09/1979   Years since quitting: 41.6  Smokeless Tobacco Never    Goals Met:  Independence with exercise equipment Exercise tolerated well No report of concerns or symptoms today Strength training completed today  Goals Unmet:  Not Applicable  Comments: Pt able to follow exercise prescription today without complaint.  Will continue to monitor for progression.    Dr. Emily Filbert is Medical Director for [place].  Dr. Ottie Glazier is Medical Director for [facility] Pulmonary Rehabilitation.

## 2021-02-27 ENCOUNTER — Encounter: Payer: Medicare Other | Admitting: *Deleted

## 2021-02-27 ENCOUNTER — Other Ambulatory Visit: Payer: Self-pay

## 2021-02-27 DIAGNOSIS — J4530 Mild persistent asthma, uncomplicated: Secondary | ICD-10-CM | POA: Diagnosis not present

## 2021-02-27 NOTE — Progress Notes (Signed)
Daily Session Note  Patient Details  Name: ANAVEY COOMBES MRN: [id number] Date of Birth: 1950-12-21 Referring Provider:   Flowsheet Row Pulmonary Rehab from 11/24/2020 in [facility] Cardiac and Pulmonary Rehab  Referring Provider Ottie Glazier MD       Encounter Date: 02/27/2021  Check In:  Session Check In - 02/27/21 1035       Check-In   Supervising physician immediately available to respond to emergencies See telemetry face sheet for immediately available ER MD    Location [facility]Cardiac & Pulmonary Rehab    Staff Present Renita Papa, RN Moises Blood, BS, ACSM CEP, Exercise Physiologist;Amanda Oletta Darter, [place], ACSM CEP, Exercise Physiologist    Virtual Visit No    Medication changes reported     No    Fall or balance concerns reported    No    Warm-up and Cool-down Performed on first and last piece of equipment    Resistance Training Performed Yes    VAD Patient? No    PAD/SET Patient? No      Pain Assessment   Currently in Pain? No/denies                Social History   Tobacco Use  Smoking Status Former   Types: Cigarettes   Quit date: 07/09/1979   Years since quitting: 41.6  Smokeless Tobacco Never    Goals Met:  Independence with exercise equipment Exercise tolerated well No report of concerns or symptoms today Strength training completed today  Goals Unmet:  Not Applicable  Comments: Pt able to follow exercise prescription today without complaint.  Will continue to monitor for progression.    Dr. Emily Filbert is Medical Director for [place].  Dr. Ottie Glazier is Medical Director for [facility] Pulmonary Rehabilitation.

## 2021-03-01 ENCOUNTER — Other Ambulatory Visit: Payer: Self-pay

## 2021-03-01 DIAGNOSIS — J4530 Mild persistent asthma, uncomplicated: Secondary | ICD-10-CM | POA: Diagnosis not present

## 2021-03-01 NOTE — Progress Notes (Signed)
Daily Session Note  Patient Details  Name: Melissa Merritt MRN: [id number] Date of Birth: 11/25/1950 Referring Provider:   Flowsheet Row Pulmonary Rehab from 11/24/2020 in [facility] Cardiac and Pulmonary Rehab  Referring Provider Aleskerov, Fuad MD       Encounter Date: 03/01/2021  Check In:  Session Check In - 03/01/21 0947       Check-In   Supervising physician immediately available to respond to emergencies See telemetry face sheet for immediately available ER MD    Location [facility] & Pulmonary Re[facility]    Staff Present Kelly Bollinger, MPA, RN;Amanda Sommer, BA, ACSM CEP, Exercise Physiologist;Joseph Hood, RCP,RRT,BSRT    Virtual Visit No    Medication changes reported     No    Fall or balance concerns reported    No    Tobacco Cessation No Change    Warm-up and Cool-down Performed on first and last piece of equipment    Resistance Training Performed Yes    VAD Patient? No    PAD/SET Patient? No      Pain Assessment   Currently in Pain? No/denies                Social History   Tobacco Use  Smoking Status Former   Types: Cigarettes   Quit date: 07/09/1979   Years since quitting: 41.6  Smokeless Tobacco Never    Goals Met:  Independence with exercise equipment Exercise tolerated well No report of concerns or symptoms today Strength training completed today  Goals Unmet:  Not Applicable  Comments: Pt able to follow exercise prescription today without complaint.  Will continue to monitor for progression.    Dr. Mark Miller is Medical Director for [facility].  Dr. Fuad Aleskerov is Medical Director for [facility] Pulmonary Rehabilitation. 

## 2021-03-06 ENCOUNTER — Encounter: Payer: Medicare Other | Admitting: *Deleted

## 2021-03-06 ENCOUNTER — Other Ambulatory Visit: Payer: Self-pay

## 2021-03-06 DIAGNOSIS — J4530 Mild persistent asthma, uncomplicated: Secondary | ICD-10-CM

## 2021-03-06 NOTE — Progress Notes (Signed)
Daily Session Note  Patient Details  Name: Melissa Merritt MRN: [id number] Date of Birth: 08/18/1950 Referring Provider:   Flowsheet Row Pulmonary Rehab from 11/24/2020 in [facility] Cardiac and Pulmonary Rehab  Referring Provider Ottie Glazier MD       Encounter Date: 03/06/2021  Check In:  Session Check In - 03/06/21 1024       Check-In   Supervising physician immediately available to respond to emergencies See telemetry face sheet for immediately available ER MD    Location [facility]-Cardiac & Pulmonary Rehab    Staff Present Renita Papa, RN Moises Blood, BS, ACSM CEP, Exercise Physiologist;Amanda Oletta Darter, [place], ACSM CEP, Exercise Physiologist    Virtual Visit No    Medication changes reported     No    Fall or balance concerns reported    No    Warm-up and Cool-down Performed on first and last piece of equipment    Resistance Training Performed Yes    VAD Patient? No    PAD/SET Patient? No      Pain Assessment   Currently in Pain? No/denies                Social History   Tobacco Use  Smoking Status Former   Types: Cigarettes   Quit date: 07/09/1979   Years since quitting: 41.6  Smokeless Tobacco Never    Goals Met:  Independence with exercise equipment Exercise tolerated well No report of concerns or symptoms today Strength training completed today  Goals Unmet:  Not Applicable  Comments: Pt able to follow exercise prescription today without complaint.  Will continue to monitor for progression.    Dr. Emily Filbert is Medical Director for [place].  Dr. Ottie Glazier is Medical Director for [facility] Pulmonary Rehabilitation.

## 2021-03-08 ENCOUNTER — Encounter: Payer: Self-pay | Admitting: *Deleted

## 2021-03-08 ENCOUNTER — Other Ambulatory Visit: Payer: Self-pay

## 2021-03-08 DIAGNOSIS — J4530 Mild persistent asthma, uncomplicated: Secondary | ICD-10-CM

## 2021-03-08 NOTE — Progress Notes (Signed)
Pulmonary Individual Treatment Plan  Patient Details  Name: Melissa Merritt MRN: [id number] Date of Birth: February 02, 1951 Referring Provider:   Flowsheet Row Pulmonary Rehab from 11/24/2020 in [facility] Cardiac and Pulmonary Rehab  Referring Provider Ottie Glazier MD       Initial Encounter Date:  Flowsheet Row Pulmonary Rehab from 11/24/2020 in [facility] Cardiac and Pulmonary Rehab  Date 11/24/20       Visit Diagnosis: Mild persistent asthma without complication  Patient's Home Medications on Admission:  Current Outpatient Medications:    amLODipine (NORVASC) 5 MG tablet, Take 1 tablet (5 mg total) by mouth daily., Disp: 90 tablet, Rfl: 4   aspirin 81 MG tablet, Take 81 mg by mouth daily., Disp: , Rfl:    Biotin 5000 MCG TABS, Take 5,000 mcg by mouth 2 (two) times daily., Disp: , Rfl:    Calcium Carb-Cholecalciferol (CALCIUM-VITAMIN D) 600-400 MG-UNIT TABS, Take 1 tablet by mouth 2 (two) times daily., Disp: , Rfl:    cholecalciferol (VITAMIN D3) 25 MCG (1000 UNIT) tablet, Take 1,000 Units by mouth in the morning and at bedtime., Disp: , Rfl:    Cyanocobalamin (B-12) 2500 MCG TABS, Take 2,500 mcg by mouth daily., Disp: , Rfl:    denosumab (PROLIA) 60 MG/ML SOSY injection, Inject 60 mg into the skin every 6 (six) months., Disp: , Rfl:    exemestane (AROMASIN) 25 MG tablet, Take 1 tablet (25 mg total) by mouth daily after breakfast., Disp: 30 tablet, Rfl: 3   ferrous sulfate 220 (44 Fe) MG/5ML solution, Take 220 mg by mouth daily., Disp: , Rfl:    furosemide (LASIX) 20 MG tablet, Take by mouth., Disp: , Rfl:    ibuprofen (ADVIL) 200 MG tablet, Take 400 mg by mouth every 6 (six) hours as needed for moderate pain. , Disp: , Rfl:    metoprolol succinate (TOPROL-XL) 25 MG 24 hr tablet, Take 1 tablet (25 mg total) by mouth daily., Disp: 90 tablet, Rfl: 4   pantoprazole (PROTONIX) 40 MG tablet, Take 1 tablet (40 mg total) by mouth 2 (two) times daily., Disp: 180 tablet, Rfl: 4   Red Yeast Rice Extract  (RED YEAST RICE PO), Take 1 tablet by mouth in the morning and at bedtime., Disp: , Rfl:    St Johns Wort 300 MG CAPS, Take 300 mg by mouth 2 (two) times daily., Disp: , Rfl:    TRELEGY ELLIPTA 100-62.5-25 MCG/INH AEPB, Inhale 1 puff into the lungs daily., Disp: , Rfl:    valsartan (DIOVAN) 160 MG tablet, Take 1 tablet (160 mg total) by mouth daily., Disp: 90 tablet, Rfl: 4  Past Medical History: Past Medical History:  Diagnosis Date   Benign tumor of lung    Breast cancer ([place])    Cancer ([place])    right breast   CHF (congestive heart failure) ([facility])    Dyspnea    GERD (gastroesophageal reflux disease)    Hyperlipidemia    Hypertension    Insomnia    Osteopenia    Personal history of radiation therapy    Plantar fasciitis    PONV (postoperative nausea and vomiting)    vomiting 2 days after lung surgery   S/P pneumonectomy    right lower lobe   Urinary incontinence     Tobacco Use: Social History   Tobacco Use  Smoking Status Former   Types: Cigarettes   Quit date: 07/09/1979   Years since quitting: 41.6  Smokeless Tobacco Never    Labs: Recent Review Scientist, physiological  Labs for ITP Cardiac and Pulmonary Rehab Latest Ref Rng & Units 11/17/2018 05/25/2019 11/23/2019 05/25/2020 11/22/2020   Cholestrol 100 - 199 mg/dL 217(H) 207(H) 207(H) 227(H) 231(H)   LDLCALC 0 - 99 mg/dL 127(H) 122(H) 128(H) 150(H) 143(H)   HDL >39 mg/dL 58 59 54 55 55   Trlycerides 0 - 149 mg/dL 160(H) 146 139 124 187(H)   Hemoglobin A1c 4.8 - 5.6 % - - 5.6 5.6 5.8(H)        Pulmonary Assessment Scores:  Pulmonary Assessment Scores     Row Name 11/24/20 1559 11/30/20 1134       ADL UCSD   ADL Phase Entry --    SOB Score total 47 47    Rest 1 1    Walk 2 2    Stairs 4 4    Bath 1 1    Dress 1 1    Shop 2 2      CAT Score   CAT Score 18 --      mMRC Score   mMRC Score -- 2             UCSD: Self-administered rating of dyspnea associated with activities of daily living  (ADLs) 6-point scale (0 = "not at all" to 5 = "maximal or unable to do because of breathlessness")  Scoring Scores range from 0 to 120.  Minimally important difference is 5 units  CAT: CAT can identify the health impairment of COPD patients and is better correlated with disease progression.  CAT has a scoring range of zero to 40. The CAT score is classified into four groups of low (less than 10), medium (10 - 20), high (21-30) and very high (31-40) based on the impact level of disease on health status. A CAT score over 10 suggests significant symptoms.  A worsening CAT score could be explained by an exacerbation, poor medication adherence, poor inhaler technique, or progression of COPD or comorbid conditions.  CAT MCID is 2 points  mMRC: mMRC (Modified Medical Research Council) Dyspnea Scale is used to assess the degree of baseline functional disability in patients of respiratory disease due to dyspnea. No minimal important difference is established. A decrease in score of 1 point or greater is considered a positive change.   Pulmonary Function Assessment:   Exercise Target Goals: Exercise Program Goal: Individual exercise prescription set using results from initial 6 min walk test and THRR while considering  patient's activity barriers and safety.   Exercise Prescription Goal: Initial exercise prescription builds to 30-45 minutes a day of aerobic activity, 2-3 days per week.  Home exercise guidelines will be given to patient during program as part of exercise prescription that the participant will acknowledge.  Education: Aerobic Exercise: - Group verbal and visual presentation on the components of exercise prescription. Introduces F.I.T.T principle from ACSM for exercise prescriptions.  Reviews F.I.T.T. principles of aerobic exercise including progression. Written material given at graduation. Flowsheet Row Pulmonary Rehab from 03/01/2021 in [facility] Cardiac and Pulmonary Rehab  Date 11/30/20   Educator [facility]  Instruction Review Code 1- Verbalizes Understanding       Education: Resistance Exercise: - Group verbal and visual presentation on the components of exercise prescription. Introduces F.I.T.T principle from ACSM for exercise prescriptions  Reviews F.I.T.T. principles of resistance exercise including progression. Written material given at graduation. Flowsheet Row Pulmonary Rehab from 03/01/2021 in [facility] Cardiac and Pulmonary Rehab  Date 12/07/20  Educator [place]  Instruction Review Code 1- Verbalizes Understanding  Education: Exercise & Equipment Safety: - Individual verbal instruction and demonstration of equipment use and safety with use of the equipment. Flowsheet Row Pulmonary Rehab from 03/01/2021 in [facility] Cardiac and Pulmonary Rehab  Education need identified 11/24/20  Date 11/24/20  Educator [place]  Instruction Review Code 1- Verbalizes Understanding       Education: Exercise Physiology & General Exercise Guidelines: - Group verbal and written instruction with models to review the exercise physiology of the cardiovascular system and associated critical values. Provides general exercise guidelines with specific guidelines to those with heart or lung disease.    Education: Flexibility, Balance, Mind/Body Relaxation: - Group verbal and visual presentation with interactive activity on the components of exercise prescription. Introduces F.I.T.T principle from ACSM for exercise prescriptions. Reviews F.I.T.T. principles of flexibility and balance exercise training including progression. Also discusses the mind body connection.  Reviews various relaxation techniques to help reduce and manage stress (i.e. Deep breathing, progressive muscle relaxation, and visualization). Balance handout provided to take home. Written material given at graduation. Flowsheet Row Pulmonary Rehab from 03/01/2021 in [facility] Cardiac and Pulmonary Rehab  Date 12/14/20  Educator [place]  Instruction  Review Code 1- Verbalizes Understanding       Activity Barriers & Risk Stratification:  Activity Barriers & Cardiac Risk Stratification - 11/24/20 1524       Activity Barriers & Cardiac Risk Stratification   Activity Barriers Shortness of Breath             6 Minute Walk:  6 Minute Walk     Row Name 11/24/20 1523 02/27/21 1039       6 Minute Walk   Phase Initial  Same walk test used from CARE post Merritt Discharge    Distance 1290 feet 1400 feet    Distance % Change -- 8.5 %    Distance Feet Change -- 110 ft    Walk Time 6 minutes 6 minutes    # of Rest Breaks 0 0    MPH 2.44 2.65    METS 2.71 3.14    RPE 11 13    Perceived Dyspnea  2 3    VO2 Peak 9.51 11    Symptoms No Yes (comment)    Comments -- SOB    Resting HR 81 bpm 86 bpm    Resting BP 140/76 140/80    Resting Oxygen Saturation  95 % 94 %    Exercise Oxygen Saturation  during 6 min walk 88 % 88 %    Max Ex. HR 113 bpm 118 bpm    Max Ex. BP 158/70 178/74    2 Minute Post BP 134/68 --      Interval HR   1 Minute HR -- 86    2 Minute HR -- 104    3 Minute HR -- 107    4 Minute HR -- 110    5 Minute HR -- 79    6 Minute HR -- 118    Interval Heart Rate? -- Yes      Interval Oxygen   Interval Oxygen? -- Yes    Baseline Oxygen Saturation % -- 94 %    1 Minute Oxygen Saturation % -- 92 %    1 Minute Liters of Oxygen -- 0 L    2 Minute Oxygen Saturation % -- 90 %    2 Minute Liters of Oxygen -- 0 L    3 Minute Oxygen Saturation % -- 89 %    3 Minute Liters  of Oxygen -- 0 L    4 Minute Oxygen Saturation % -- 89 %    4 Minute Liters of Oxygen -- 0 L    5 Minute Oxygen Saturation % -- 88 %    5 Minute Liters of Oxygen -- 0 L    6 Minute Oxygen Saturation % -- 89 %    6 Minute Liters of Oxygen -- 0 L    2 Minute Post Liters of Oxygen -- 0 L            Oxygen Initial Assessment:  Oxygen Initial Assessment - 11/24/20 1559       Home Oxygen   Home Oxygen Device None    Sleep Oxygen  Prescription None    Home Exercise Oxygen Prescription None    Home Resting Oxygen Prescription None      Initial 6 min Walk   Oxygen Used None      Program Oxygen Prescription   Program Oxygen Prescription None      Intervention   Short Term Goals To learn and demonstrate proper pursed lip breathing techniques or other breathing techniques. ;To learn and demonstrate proper use of respiratory medications    Long  Term Goals Demonstrates proper use of MDI's;Compliance with respiratory medication;Exhibits proper breathing techniques, such as pursed lip breathing or other method taught during program session             Oxygen Re-Evaluation:  Oxygen Re-Evaluation     [place] Name 12/30/20 1025 01/18/21 1000 02/13/21 1013         Program Oxygen Prescription   Program Oxygen Prescription None None None       Home Oxygen   Home Oxygen Device None None None     Sleep Oxygen Prescription None;CPAP  plans to start it tonight None;CPAP None;CPAP     Home Exercise Oxygen Prescription None None None     Home Resting Oxygen Prescription None None None     Compliance with Home Oxygen Use -- Yes Yes       Goals/Expected Outcomes   Short Term Goals To learn and demonstrate proper pursed lip breathing techniques or other breathing techniques. ;To learn and demonstrate proper use of respiratory medications;To learn and understand importance of maintaining oxygen saturations>88%;To learn and understand importance of monitoring SPO2 with pulse oximeter and demonstrate accurate use of the pulse oximeter. To learn and understand importance of maintaining oxygen saturations>88%;To learn and understand importance of monitoring SPO2 with pulse oximeter and demonstrate accurate use of the pulse oximeter. To learn and demonstrate proper pursed lip breathing techniques or other breathing techniques. ;Other     Long  Term Goals Verbalizes importance of monitoring SPO2 with pulse oximeter and return  demonstration;Maintenance of O2 saturations>88%;Exhibits proper breathing techniques, such as pursed lip breathing or other method taught during program session;Compliance with respiratory medication;Demonstrates proper use of MDI's Maintenance of O2 saturations>88%;Verbalizes importance of monitoring SPO2 with pulse oximeter and return demonstration Exhibits proper breathing techniques, such as pursed lip breathing or other method taught during program session;Other     Comments Melissa Merritt has been doing well in rehab.  She has recently gotten a CPAP and plans to start using it tonight.  Her breathing is getting better and she is good about using her PLB.  She swears by her inhalers!! Melissa MerrittShe has a pulse oximeter to check her oxygen saturation at home. Informed and  explained why it is important to have one. Reviewed that oxygen saturations should be 88 percent and above. Diaphragmatic and PLB breathing explained and performed with patient. Patient has a better understanding of how to do these exercises to help with breathing performance and relaxation. Patient performed breathing techniques adequately and to practice further at home.     Goals/Expected Outcomes Short: Start using CPAP Long: Continued compliance Short: monitor oxygen at home with exertion. Long: maintain oxygen saturations above 88 percent independently. Short: practice PLB and diaphragmatic breathing at home. Long: Use PLB and diaphragmatic breathing independently post [employer].              Oxygen Discharge (Final Oxygen Re-Evaluation):  Oxygen Re-Evaluation - 02/13/21 1013       Program Oxygen Prescription   Program Oxygen Prescription None      Home Oxygen   Home Oxygen Device None    Sleep Oxygen Prescription None;CPAP    Home Exercise Oxygen Prescription None    Home Resting Oxygen Prescription None    Compliance with Home Oxygen Use Yes       Goals/Expected Outcomes   Short Term Goals To learn and demonstrate proper pursed lip breathing techniques or other breathing techniques. ;Other    Long  Term Goals Exhibits proper breathing techniques, such as pursed lip breathing or other method taught during program session;Other    Comments Diaphragmatic and PLB breathing explained and performed with patient. Patient has a better understanding of how to do these exercises to help with breathing performance and relaxation. Patient performed breathing techniques adequately and to practice further at home.    Goals/Expected Outcomes Short: practice PLB and diaphragmatic breathing at home. Long: Use PLB and diaphragmatic breathing independently post [employer].             Initial Exercise Prescription:  Initial Exercise Prescription - 11/24/20 1600       Date of Initial Exercise RX and Referring Provider   Date 11/24/20    Referring Provider Ottie Glazier MD      Recumbant Bike   Level 3    RPM 60    Watts 25    Minutes 15    METs 2.71      REL-XR   Level 3    Speed 50    Minutes 15    METs 2.71      T5 Nustep   Level 5    Minutes 15    METs 2.71      Track   Laps 40    Minutes 15    METs 3.18      Prescription Details   Frequency (times per week) 3    Duration Progress to 30 minutes of continuous aerobic without signs/symptoms of physical distress      Intensity   THRR 40-80% of Max Heartrate 108-135    Ratings of Perceived Exertion 11-13    Perceived Dyspnea 0-4      Progression   Progression Continue to progress workloads to maintain intensity without signs/symptoms of physical distress.      Resistance Training   Training Prescription Yes    Weight 5 lb    Reps 10-15             Perform Capillary Blood Glucose checks as needed.  Exercise Prescription Changes:   Exercise Prescription Changes     Row Name 11/24/20 1500 12/05/20 0900 12/19/20 1500 12/30/20 1000 01/02/21 0900     Response to  Exercise  Blood Pressure (Admit) 140/76 136/64 104/60 -- 128/76   Blood Pressure (Exercise) 158/70 126/64 142/80 -- 154/64   Blood Pressure (Exit) 134/68 124/70 110/62 -- 122/68   Heart Rate (Admit) 81 bpm 76 bpm 75 bpm -- 80 bpm   Heart Rate (Exercise) 113 bpm 115 bpm 102 bpm -- 102 bpm   Heart Rate (Exit) 85 bpm 99 bpm 70 bpm -- 93 bpm   Oxygen Saturation (Admit) 95 % 95 % 95 % -- 95 %   Oxygen Saturation (Exercise) 88 % 89 % 90 % -- 92 %   Oxygen Saturation (Exit) 97 % 93 % 94 % -- 93 %   Rating of Perceived Exertion (Exercise) _0 -- 13   Perceived Dyspnea (Exercise) _1 -- 2   Symptoms none none SOB -- SOB   Comments Pulmonary rehab walk test results (used from CARE) -- -- -- --   Duration -- Continue with 30 min of aerobic exercise without signs/symptoms of physical distress. Continue with 30 min of aerobic exercise without signs/symptoms of physical distress. -- Continue with 30 min of aerobic exercise without signs/symptoms of physical distress.   Intensity -- THRR unchanged THRR unchanged -- THRR unchanged     Progression   Progression -- Continue to progress workloads to maintain intensity without signs/symptoms of physical distress. Continue to progress workloads to maintain intensity without signs/symptoms of physical distress. -- Continue to progress workloads to maintain intensity without signs/symptoms of physical distress.   Average METs -- 3 3.23 -- 2.73     Resistance Training   Training Prescription -- Yes Yes -- Yes   Weight -- 5 lb 5 lb -- 5 lb   Reps -- 10-15 10-15 -- 10-15     Interval Training   Interval Training -- -- No -- No     Recumbant Bike   Level -- -- 3 -- --   Watts -- -- 22 -- --   Minutes -- -- 15 -- --   METs -- -- 2.59 -- --     REL-XR   Level -- 3 3 -- --   Speed -- 50 -- -- --   Minutes -- 15 15 -- --   METs -- 2.9 4.6 -- --     T5 Nustep   Level -- -- -- -- 6   Minutes -- -- -- -- 15   METs -- -- -- -- 2.2     Track    Laps -- 40 27 -- 45   Minutes -- 15 15 -- 15   METs -- 3.18 2.47 -- 3.45     Home Exercise Plan   Plans to continue exercise at -- -- -- Home (comment)  walking Home (comment)  walking   Frequency -- -- -- Add 2 additional days to program exercise sessions. Add 2 additional days to program exercise sessions.   Initial Home Exercises Provided -- -- -- 12/30/20 12/30/20     Oxygen   Maintain Oxygen Saturation -- -- -- 88% or higher 88% or higher    Row Name 01/16/21 1000 01/30/21 1300 02/14/21 1400 02/27/21 1300       Response to Exercise   Blood Pressure (Admit) 126/72 134/84 132/66 128/68    Blood Pressure (Exercise) 138/64 142/70 -- --    Blood Pressure (Exit) 122/78 124/72 118/68 122/78    Heart Rate (Admit) 88 bpm 82 bpm 83 bpm 82 bpm    Heart Rate (Exercise) 112 bpm 104 bpm  109 bpm 101 bpm    Heart Rate (Exit) 93 bpm 87 bpm 87 bpm 92 bpm    Oxygen Saturation (Admit) 95 % 93 % 95 % 94 %    Oxygen Saturation (Exercise) 94 % 90 % 90 % 92 %    Oxygen Saturation (Exit) 94 % 95 % 94 % 92 %    Rating of Perceived Exertion (Exercise) _0 Perceived Dyspnea (Exercise) _1 Symptoms SOB -- SOB SOB    Duration Continue with 30 min of aerobic exercise without signs/symptoms of physical distress. Continue with 30 min of aerobic exercise without signs/symptoms of physical distress. Continue with 30 min of aerobic exercise without signs/symptoms of physical distress. Continue with 30 min of aerobic exercise without signs/symptoms of physical distress.    Intensity THRR unchanged THRR unchanged THRR unchanged THRR unchanged      Progression   Progression Continue to progress workloads to maintain intensity without signs/symptoms of physical distress. Continue to progress workloads to maintain intensity without signs/symptoms of physical distress. Continue to progress workloads to maintain intensity without signs/symptoms of physical distress. Continue to progress workloads to  maintain intensity without signs/symptoms of physical distress.    Average METs 3.09 2.9 2.79 2.79      Resistance Training   Training Prescription Yes Yes Yes Yes    Weight 5 lb 5 lb 5 lb 5 lb    Reps 10-15 10-15 10-15 10-15      Interval Training   Interval Training No No No No      NuStep   Level -- -- 5 --    Minutes -- -- 15 --    METs -- -- 3.5 --      REL-XR   Level -- -- 4 --    Minutes -- -- 15 --    METs -- -- 2.8 --      T5 Nustep   Level _2 Minutes _3 METs 3.2 2.4 2.1 2.3      Track   Laps 31 45 32 42    Minutes _4 METs 2.69 3.28 2.74 3.28      Home Exercise Plan   Plans to continue exercise at Home (comment)  walking Home (comment)  walking Home (comment)  walking Home (comment)  walking    Frequency Add 2 additional days to program exercise sessions. Add 2 additional days to program exercise sessions. Add 2 additional days to program exercise sessions. Add 2 additional days to program exercise sessions.    Initial Home Exercises Provided 12/30/20 12/30/20 12/30/20 12/30/20      Oxygen   Maintain Oxygen Saturation 88% or higher 88% or higher 88% or higher 88% or higher             Exercise Comments:   Exercise Comments     Row Name 11/30/20 1012           Exercise Comments First full day of exercise!  Patient was oriented to gym and equipment including functions, settings, policies, and procedures.  Patient's individual exercise prescription and treatment plan were reviewed.  All starting workloads were established based on the results of the 6 minute walk test done at initial orientation visit.  The plan for exercise progression was also introduced and progression will be customized based on patient's performance and goals.  Exercise Goals and Review:   Exercise Goals     Row Name 11/24/20 1613             Exercise Goals   Increase Physical Activity Yes       Intervention Provide  advice, education, support and counseling about physical activity/exercise needs.;Develop an individualized exercise prescription for aerobic and resistive training based on initial evaluation findings, risk stratification, comorbidities and participant's personal goals.       Expected Outcomes Long Term: Add in home exercise to make exercise part of routine and to increase amount of physical activity.;Short Term: Attend rehab on a regular basis to increase amount of physical activity.;Long Term: Exercising regularly at least 3-5 days a week.       Increase Strength and Stamina Yes       Intervention Provide advice, education, support and counseling about physical activity/exercise needs.;Develop an individualized exercise prescription for aerobic and resistive training based on initial evaluation findings, risk stratification, comorbidities and participant's personal goals.       Expected Outcomes Short Term: Increase workloads from initial exercise prescription for resistance, speed, and METs.;Short Term: Perform resistance training exercises routinely during rehab and add in resistance training at home;Long Term: Improve cardiorespiratory fitness, muscular endurance and strength as measured by increased METs and functional capacity ([place])       Able to understand and use rate of perceived exertion (RPE) scale Yes       Intervention Provide education and explanation on how to use RPE scale       Expected Outcomes Short Term: Able to use RPE daily in rehab to express subjective intensity level;Long Term:  Able to use RPE to guide intensity level when exercising independently       Able to understand and use Dyspnea scale Yes       Intervention Provide education and explanation on how to use Dyspnea scale       Expected Outcomes Short Term: Able to use Dyspnea scale daily in rehab to express subjective sense of shortness of breath during exertion;Long Term: Able to use Dyspnea scale to guide intensity  level when exercising independently       Knowledge and understanding of Target Heart Rate Range (THRR) Yes       Intervention Provide education and explanation of THRR including how the numbers were predicted and where they are located for reference       Expected Outcomes Short Term: Able to state/look up THRR;Long Term: Able to use THRR to govern intensity when exercising independently;Short Term: Able to use daily as guideline for intensity in rehab       Able to check pulse independently Yes       Intervention Provide education and demonstration on how to check pulse in carotid and radial arteries.;Review the importance of being able to check your own pulse for safety during independent exercise       Expected Outcomes Short Term: Able to explain why pulse checking is important during independent exercise;Long Term: Able to check pulse independently and accurately       Understanding of Exercise Prescription Yes       Intervention Provide education, explanation, and written materials on patient's individual exercise prescription       Expected Outcomes Short Term: Able to explain program exercise prescription;Long Term: Able to explain home exercise prescription to exercise independently                Exercise Goals Re-Evaluation :  Exercise Goals Re-Evaluation     [place] Name 11/30/20 1012 12/05/20 0935 12/19/20 1517 12/30/20 1029 01/02/21 0911     Exercise Goal Re-Evaluation   Exercise Goals Review Increase Physical Activity;Able to understand and use rate of perceived exertion (RPE) scale;Knowledge and understanding of Target Heart Rate Range (THRR);Understanding of Exercise Prescription;Increase Strength and Stamina;Able to understand and use Dyspnea scale;Able to check pulse independently Increase Physical Activity;Increase Strength and Stamina Increase Physical Activity;Increase Strength and Stamina;Understanding of Exercise Prescription Increase Physical Activity;Increase Strength  and Stamina;Understanding of Exercise Prescription;Able to understand and use rate of perceived exertion (RPE) scale;Able to understand and use Dyspnea scale;Knowledge and understanding of Target Heart Rate Range (THRR);Able to check pulse independently Increase Physical Activity;Increase Strength and Stamina   Comments Reviewed RPE and dyspnea scales, THR and program prescription with pt today.  Pt voiced understanding and was given a copy of goals to take home. -- Melissa Merritt is doing well in rehab.  She is 27 laps and 4.6 METs on the XR.  We will continue to monitor her progress. Updated home exercise from [facility] with pt today.  Pt plans to walk at home for exercise.  Reviewed THR, pulse, RPE, sign and symptoms, pulse oximetery and when to call 911 or MD.  Also discussed weather considerations and indoor options.  Pt voiced understanding. Melissa Merritt has progressed to 45 laps on the track! Oxygen sats have stayed in the 90s on room air.  Staff will continue to monitor progress.   Expected Outcomes Short: Use RPE daily to regulate intensity. Long: Follow program prescription in THR. -- Short: Aim for 30 laps Long: Continue to improve stamina Short: Start to add in more walking at home Long: Continue to improve stamina. Short:continue to attend consistently Long: build overall stamina    Row Name 01/16/21 1036 01/30/21 1356 02/13/21 1018 02/27/21 1341       Exercise Goal Re-Evaluation   Exercise Goals Review Increase Physical Activity;Increase Strength and Stamina Increase Physical Activity;Increase Strength and Stamina Increase Physical Activity;Increase Strength and Stamina;Understanding of Exercise Prescription Increase Physical Activity;Increase Strength and Stamina    Comments Melissa Merritt is doing well in rehab. She did increase to level 6 on the T5 Nustep. Her number of laps on the track vary each week and should maintain a consistency/increase over time. Will continue to monitor. Melissa Merritt does well on the T5 and track.    Oxygen stays in the 90s. She does not reach THR range and RPE is 11-12. Staff will encourage her to try new equipment or possibly intervals. Asked patient if she is going to exercise after the program. She states she would like to go to a gym close to her. She knows she is going to have to continue to exercise post [facility]. Melissa Merritt by 110 feet!  She will graduate in the next few sessions.    Expected Outcomes Short: Maintain consistent laps on track and continue to increase number Long: Continue to increase overall MET level Short: try new equipment Long:  build overall stamina Short: finish [facility]. Long: workout at a gym post graduation. Short: complete LW program Long:  maintain exercise on her own             Discharge Exercise Prescription (Final Exercise Prescription Changes):  Exercise Prescription Changes - 02/27/21 1300       Response to Exercise   Blood Pressure (Admit) 128/68    Blood Pressure (Exit) 122/78    Heart Rate (Admit) 82 bpm  Heart Rate (Exercise) 101 bpm    Heart Rate (Exit) 92 bpm    Oxygen Saturation (Admit) 94 %    Oxygen Saturation (Exercise) 92 %    Oxygen Saturation (Exit) 92 %    Rating of Perceived Exertion (Exercise) 15    Perceived Dyspnea (Exercise) 2    Symptoms SOB    Duration Continue with 30 min of aerobic exercise without signs/symptoms of physical distress.    Intensity THRR unchanged      Progression   Progression Continue to progress workloads to maintain intensity without signs/symptoms of physical distress.    Average METs 2.79      Resistance Training   Training Prescription Yes    Weight 5 lb    Reps 10-15      Interval Training   Interval Training No      T5 Nustep   Level 5    Minutes 15    METs 2.3      Track   Laps 42    Minutes 15    METs 3.28      Home Exercise Plan   Plans to continue exercise at Home (comment)   walking   Frequency Add 2 additional days to program exercise sessions.     Initial Home Exercises Provided 12/30/20      Oxygen   Maintain Oxygen Saturation 88% or higher             Nutrition:  Target Goals: Understanding of nutrition guidelines, daily intake of sodium '1500mg'$ , cholesterol '200mg'$ , calories 30% from fat and 7% or less from saturated fats, daily to have 5 or more servings of fruits and vegetables.  Education: All About Nutrition: -Group instruction provided by verbal, written material, interactive activities, discussions, models, and posters to present general guidelines for heart healthy nutrition including fat, fiber, MyPlate, the role of sodium in heart healthy nutrition, utilization of the nutrition label, and utilization of this knowledge for meal planning. Follow up email sent as well. Written material given at graduation. Flowsheet Row Pulmonary Rehab from 03/01/2021 in [facility] Cardiac and Pulmonary Rehab  Date 12/28/20  Educator [facility]  Instruction Review Code 1- Verbalizes Understanding       Biometrics:  Pre Biometrics - 11/30/20 1135       Pre Biometrics   Single Leg Stand 3.04 seconds              Nutrition Therapy Plan and Nutrition Goals:  Nutrition Therapy & Goals - 01/04/21 0917       Nutrition Therapy   Diet Heart healthy , low Na, Pulmonary MNT    Protein (specify units) 120g    Fiber 25 grams    Whole Grain Foods 3 servings    Saturated Fats 12 max. grams    Fruits and Vegetables 8 servings/day    Sodium 1.5 grams      Personal Nutrition Goals   Nutrition Goal ST: include nuts/seeds with yogurt (she likes peach yoplait - no greek yogurt), limit sugar sweetened beverages. LT: meet protein/calorie needs, eat more than one meal per day    Comments Still cutting down on sugary drinks.She does not eat breakfast. L: ritz crackers or some leftover which is rare D: Mykinos. husband will cook a lot of times. Last night she had sloppy joes with no bun and a salad (lite honey mustard dressing) with potatoes and  onions as well grilling beans. She will put kens lite honey mustard on sandwiches as well. She reports no  shortness of breath during or after meals. Weight stable. She will drink body armor drinks and other beverages that have zero sugar as she tries to reduce her consumption of sugar sweetened beverages. Discussed heart healthy eating, pulmonary MNT.      Intervention Plan   Intervention Prescribe, educate and counsel regarding individualized specific dietary modifications aiming towards targeted core components such as weight, hypertension, lipid management, diabetes, heart failure and other comorbidities.    Expected Outcomes Short Term Goal: Understand basic principles of dietary content, such as calories, fat, sodium, cholesterol and nutrients.;Short Term Goal: A plan has been developed with personal nutrition goals set during dietitian appointment.;Long Term Goal: Adherence to prescribed nutrition plan.             Nutrition Assessments:  MEDIFICTS Score Key: ?70 Need to make dietary changes  40-70 Heart Healthy Diet ? 40 Therapeutic Level Cholesterol Diet  Flowsheet Row Pulmonary Rehab from 11/24/2020 in [facility] Cardiac and Pulmonary Rehab  Picture Your Plate Total Score on Admission 57      Picture Your Plate Scores: <13 Unhealthy dietary pattern with much room for improvement. 41-50 Dietary pattern unlikely to meet recommendations for good health and room for improvement. 51-60 More healthful dietary pattern, with some room for improvement.  >60 Healthy dietary pattern, although there may be some specific behaviors that could be improved.   Nutrition Goals Re-Evaluation:  Nutrition Goals Re-Evaluation     [place] Name 12/30/20 0955 01/18/21 1003 02/13/21 1025         Goals   Current Weight -- 228 lb (103.4 kg) 227 lb (103 kg)     Nutrition Goal Cut back on sugar drinks and heart healthy eating She would like to lose weight and reduce her sugar intake. She would like to lose  weight and reduce her sugar intake.     Comment Melissa Merritt is working on cutting back on her sugary drinks.  She is doing better but doesn't think she can cut it out completely.  She is trying to eat a balance diet otherwise. Melissa Merritt is working on reducing the number of Pepsi she drinks a day. She has cut back to 12 Pepsi's a day. She knows she needs to stop drinking the sugar drinks. She has reflux and takes protonics once a day. Melissa Merritt is trying to cut back on her Pepsi intake.     Expected Outcome Short: Continue to cut back on soda LOng: Continue to use heart healthy guidelines. Short: cut back on Pepsi. Long: reduce to 1 Pepsi per day. Short: cut back on Pepsi. Long: reduce to 5 Pepsi per day.              Nutrition Goals Discharge (Final Nutrition Goals Re-Evaluation):  Nutrition Goals Re-Evaluation - 02/13/21 1025       Goals   Current Weight 227 lb (103 kg)    Nutrition Goal She would like to lose weight and reduce her sugar intake.    Comment Melissa Merritt is trying to cut back on her Pepsi intake.    Expected Outcome Short: cut back on Pepsi. Long: reduce to 5 Pepsi per day.             Psychosocial: Target Goals: Acknowledge presence or absence of significant depression and/or stress, maximize coping skills, provide positive support system. Participant is able to verbalize types and ability to use techniques and skills needed for reducing stress and depression.   Education: Stress, Anxiety, and Depression - Group verbal and visual  presentation to define topics covered.  Reviews how body is impacted by stress, anxiety, and depression.  Also discusses healthy ways to reduce stress and to treat/manage anxiety and depression.  Written material given at graduation. Flowsheet Row Pulmonary Rehab from 03/01/2021 in [facility] Cardiac and Pulmonary Rehab  Date 01/18/21  Educator [facility]  Instruction Review Code 1- Verbalizes Understanding       Education: Sleep Hygiene -Provides group verbal and  written instruction about how sleep can affect your health.  Define sleep hygiene, discuss sleep cycles and impact of sleep habits. Review good sleep hygiene tips.    Initial Review & Psychosocial Screening:  Initial Psych Review & Screening - 11/24/20 1304       Initial Review   Current issues with None Identified      Family Dynamics   Good Support System? Yes   son and his family building house next door, another son and a daughter out of town     Barriers   Psychosocial barriers to participate in program There are no identifiable barriers or psychosocial needs.;The patient should benefit from training in stress management and relaxation.      Screening Interventions   Interventions Encouraged to exercise;Provide feedback about the scores to participant;To provide support and resources with identified psychosocial needs    Expected Outcomes Short Term goal: Utilizing psychosocial counselor, staff and physician to assist with identification of specific Stressors or current issues interfering with healing process. Setting desired goal for each stressor or current issue identified.;Long Term Goal: Stressors or current issues are controlled or eliminated.;Short Term goal: Identification and review with participant of any Quality of Life or Depression concerns found by scoring the questionnaire.;Long Term goal: The participant improves quality of Life and PHQ9 Scores as seen by post scores and/or verbalization of changes             Quality of Life Scores:  Scores of 19 and below usually indicate a poorer quality of life in these areas.  A difference of  2-3 points is a clinically meaningful difference.  A difference of 2-3 points in the total score of the Quality of Life Index has been associated with significant improvement in overall quality of life, self-image, physical symptoms, and general health in studies assessing change in quality of life.  PHQ-9: Recent Review Flowsheet Data      Depression screen [facility] 2/9 11/24/2020 05/30/2020 05/25/2020 11/23/2019 05/25/2019   Decreased Interest 0 0 0 0 0   Down, Depressed, Hopeless 0 0 0 0 0    PHQ - 2 Score 0 0 0 0 0   Altered sleeping 1 - - - -   Tired, decreased energy 1 - - - -   Change in appetite 0 - - - -   Feeling bad or failure about yourself  0 - - - -   Trouble concentrating 0 - - - -   Moving slowly or fidgety/restless 0 - - - -   Suicidal thoughts 0 - - - -   PHQ-9 Score 2 - - - -   Difficult doing work/chores Not difficult at all - - - -      Interpretation of Total Score  Total Score Depression Severity:  1-4 = Minimal depression, 5-9 = Mild depression, 10-14 = Moderate depression, 15-19 = Moderately severe depression, 20-27 = Severe depression   Psychosocial Evaluation and Intervention:  Psychosocial Evaluation - 11/24/20 1318       Psychosocial Evaluation & Interventions  Comments Almarie has no barriers to entering the program. She is ready to satrt and work on her symptom control. Milley lives with her husband. Their son and his family is building a house next door and until it is completed they are living in a camper at [employer].  Son, his wife and 80yrold twins.  Her husband has recently had a stroke, same time she was getting her cancer treatments. She states she does not feel stressed.  She has other children that live out of town.    Expected Outcomes STG: STerrillwill be able to attend all scheduled sessions, she will continue to maintain the low stress levels even with all the events happening in her life.  LTG: Melissa Merritt continue to maintain her progression    Continue Psychosocial Services  Follow up required by staff             Psychosocial Re-Evaluation:  Psychosocial Re-Evaluation     [place] Village[place] 12/30/20 0594510/12/22 1010 02/13/21 1028         Psychosocial Re-Evaluation   Current issues with None Identified None Identified None Identified     Comments Melissa Prowsis doing well in  rehab. It's grandtwins birthday today and they were up early to open presents.  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  She sleeps pretty well over all. She is going to start using her CPAP. Patient reports no issues with their current mental states, sleep, stress, depression or anxiety. Will follow up with patient in a few weeks for any changes. Patient reports no issues with their current mental states, sleep, stress, depression or anxiety. Will follow up with patient in a few weeks for any changes.     Expected Outcomes Short: Continue to stay positive Long: Continue take care of self. Short: Continue to exercise regularly to support mental health and notify staff of any changes. Long: maintain mental health and well being through teaching of rehab or prescribed medications independently. Short: Continue to exercise regularly to support mental health and notify staff of any changes. Long: maintain mental health and well being through teaching of rehab or prescribed medications independently.     Interventions Encouraged to attend Pulmonary Rehabilitation for the exercise Encouraged to attend Pulmonary Rehabilitation for the exercise Encouraged to attend Pulmonary Rehabilitation for the exercise     Continue Psychosocial Services  -- Follow up required by staff Follow up required by staff              Psychosocial Discharge (Final Psychosocial Re-Evaluation):  Psychosocial Re-Evaluation - 02/13/21 1028       Psychosocial Re-Evaluation   Current issues with None Identified    Comments Patient reports no issues with their current mental states, sleep, stress, depression or anxiety. Will follow up with patient in a few weeks for any changes.    Expected Outcomes Short: Continue to exercise regularly to support mental health and notify staff of any changes. Long: maintain mental health and well being through teaching of rehab or prescribed medications  independently.    Interventions Encouraged to attend Pulmonary Rehabilitation for the exercise    Continue Psychosocial Services  Follow up required by staff             Education: Education Goals: Education classes will be provided on a weekly basis, covering required topics. Participant will state understanding/return demonstration of topics presented.  Learning Barriers/Preferences:  Learning Barriers/Preferences - 11/24/20 1306  Learning Barriers/Preferences   Learning Barriers None    Learning Preferences None             General Pulmonary Education Topics:  Infection Prevention: - Provides verbal and written material to individual with discussion of infection control including proper hand washing and proper equipment cleaning during exercise session. Flowsheet Row Pulmonary Rehab from 03/01/2021 in [facility] Cardiac and Pulmonary Rehab  Education need identified 11/24/20  Date 11/24/20  Educator [place]  Instruction Review Code 1- Verbalizes Understanding       Falls Prevention: - Provides verbal and written material to individual with discussion of falls prevention and safety. Flowsheet Row Pulmonary Rehab from 03/01/2021 in [facility] Cardiac and Pulmonary Rehab  Education need identified 11/24/20  Date 11/24/20  Educator [place]  Instruction Review Code 1- Verbalizes Understanding       Chronic Lung Disease Review: - Group verbal instruction with posters, models, PowerPoint presentations and videos,  to review new updates, new respiratory medications, new advancements in procedures and treatments. Providing information on websites and "800" numbers for continued self-education. Includes information about supplement oxygen, available portable oxygen systems, continuous and intermittent flow rates, oxygen safety, concentrators, and Medicare reimbursement for oxygen. Explanation of Pulmonary Drugs, including class, frequency, complications, importance of spacers, rinsing  mouth after steroid MDI's, and proper cleaning methods for nebulizers. Review of basic lung anatomy and physiology related to function, structure, and complications of lung disease. Review of risk factors. Discussion about methods for diagnosing sleep apnea and types of masks and machines for OSA. Includes a review of the use of types of environmental controls: home humidity, furnaces, filters, dust mite/pet prevention, HEPA vacuums. Discussion about weather changes, air quality and the benefits of nasal washing. Instruction on Warning signs, infection symptoms, calling MD promptly, preventive modes, and value of vaccinations. Review of effective airway clearance, coughing and/or vibration techniques. Emphasizing that all should Create an Action Plan. Written material given at graduation. Flowsheet Row Pulmonary Rehab from 03/01/2021 in [facility] Cardiac and Pulmonary Rehab  Date 01/11/21  Educator [facility]  Instruction Review Code 1- Verbalizes Understanding       AED/CPR: - Group verbal and written instruction with the use of models to demonstrate the basic use of the AED with the basic ABC's of resuscitation.    Anatomy and Cardiac Procedures: - Group verbal and visual presentation and models provide information about basic cardiac anatomy and function. Reviews the testing methods done to diagnose heart disease and the outcomes of the test results. Describes the treatment choices: Medical Management, Angioplasty, or Coronary Bypass Surgery for treating various heart conditions including Myocardial Infarction, Angina, Valve Disease, and Cardiac Arrhythmias.  Written material given at graduation. Flowsheet Row Pulmonary Rehab from 03/01/2021 in [facility] Cardiac and Pulmonary Rehab  Date 12/07/20  Educator [place]  Instruction Review Code 1- Verbalizes Understanding       Medication Safety: - Group verbal and visual instruction to review commonly prescribed medications for heart and lung disease. Reviews the  medication, class of the drug, and side effects. Includes the steps to properly store meds and maintain the prescription regimen.  Written material given at graduation. Flowsheet Row Pulmonary Rehab from 03/01/2021 in [facility] Cardiac and Pulmonary Rehab  Date 03/01/21  Educator SB  Instruction Review Code 1- Verbalizes Understanding       Other: -Provides group and verbal instruction on various topics (see comments)   Knowledge Questionnaire Score:  Knowledge Questionnaire Score - 11/24/20 1602       Knowledge Questionnaire  Score   Pre Score 16/18: Oxygen              Core Components/Risk Factors/Patient Goals at Admission:  Personal Goals and Risk Factors at Admission - 11/24/20 1614       Core Components/Risk Factors/Patient Goals on Admission    Weight Management Yes;Weight Loss    Intervention Weight Management: Develop a combined nutrition and exercise program designed to reach desired caloric intake, while maintaining appropriate intake of nutrient and fiber, sodium and fats, and appropriate energy expenditure required for the weight goal.;Weight Management: Provide education and appropriate resources to help participant work on and attain dietary goals.    Admit Weight 226 lb (102.5 kg)    Goal Weight: Short Term 220 lb (99.8 kg)    Goal Weight: Long Term 180 lb (81.6 kg)    Expected Outcomes Short Term: Continue to assess and modify interventions until short term weight is achieved;Long Term: Adherence to nutrition and physical activity/exercise program aimed toward attainment of established weight goal;Weight Loss: Understanding of general recommendations for a balanced deficit meal plan, which promotes 1-2 lb weight loss per week and includes a negative energy balance of [phone] kcal/d;Understanding recommendations for meals to include 15-35% energy as protein, 25-35% energy from fat, 35-60% energy from carbohydrates, less than $RemoveB'200mg'ntyBZQGh$  of dietary cholesterol, 20-35 gm of  total fiber daily;Understanding of distribution of calorie intake throughout the day with the consumption of 4-5 meals/snacks    Increase knowledge of respiratory medications and ability to use respiratory devices properly  Yes    Intervention Provide education and demonstration as needed of appropriate use of medications, inhalers, and oxygen therapy.    Expected Outcomes Short Term: Achieves understanding of medications use. Understands that oxygen is a medication prescribed by physician. Demonstrates appropriate use of inhaler and oxygen therapy.;Long Term: Maintain appropriate use of medications, inhalers, and oxygen therapy.    Hypertension Yes    Intervention Provide education on lifestyle modifcations including regular physical activity/exercise, weight management, moderate sodium restriction and increased consumption of fresh fruit, vegetables, and low fat dairy, alcohol moderation, and smoking cessation.;Monitor prescription use compliance.    Expected Outcomes Short Term: Continued assessment and intervention until BP is < 140/76mm HG in hypertensive participants. < 130/84mm HG in hypertensive participants with diabetes, heart failure or chronic kidney disease.;Long Term: Maintenance of blood pressure at goal levels.    Lipids Yes    Intervention Provide education and support for participant on nutrition & aerobic/resistive exercise along with prescribed medications to achieve LDL '70mg'$ , HDL >$Remo'40mg'BYcPC$ .    Expected Outcomes Short Term: Participant states understanding of desired cholesterol values and is compliant with medications prescribed. Participant is following exercise prescription and nutrition guidelines.;Long Term: Cholesterol controlled with medications as prescribed, with individualized exercise RX and with personalized nutrition plan. Value goals: LDL < $Rem'70mg'StCD$ , HDL > 40 mg.             Education:Diabetes - Individual verbal and written instruction to review signs/symptoms of  diabetes, desired ranges of glucose level fasting, after meals and with exercise. Acknowledge that pre and post exercise glucose checks will be done for 3 sessions at entry of program.   Know Your Numbers and Heart Failure: - Group verbal and visual instruction to discuss disease risk factors for cardiac and pulmonary disease and treatment options.  Reviews associated critical values for Overweight/Obesity, Hypertension, Cholesterol, and Diabetes.  Discusses basics of heart failure: signs/symptoms and treatments.  Introduces Heart Failure Zone chart for action plan for  heart failure.  Written material given at graduation.   Core Components/Risk Factors/Patient Goals Review:   Goals and Risk Factor Review     Row Name 12/30/20 0957 01/18/21 1002 02/13/21 1021         Core Components/Risk Factors/Patient Goals Review   Personal Goals Review Weight Management/Obesity;Increase knowledge of respiratory medications and ability to use respiratory devices properly.;Improve shortness of breath with ADL's;Hypertension;Lipids Improve shortness of breath with ADL's Weight Management/Obesity     Review Melissa Merritt is doing well in rehab. Her weight goes up and down and she is using her dieruetic.  She is doing better with her breathing and is now on a CPAP but hasnt't started yet.  Continue to montior blood pressures closely.  She loves her inhalers and is good about using them!! Spoke to patient about their shortness of breath and what they can do to improve. Patient has been informed of breathing techniques when starting the program. Patient is informed to tell staff if they have had any med changes and that certain meds they are taking or not taking can be causing shortness of breath. 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 She states she has been lowering her intake of Pepsi. 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      Expected Outcomes Short: Continue to work on weight loss  Long: Conitnue to monitor risk factors. Short: Attend [facility] regularly to improve shortness of breath with ADL's. Long: maintain independence with ADL's Short: lose more weight and decrease Pepsi intake.  Long: decrease weight and intake of Pepsi.              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 She states she has been lowering her intake of Pepsi. 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    Expected Outcomes Short: lose more weight and decrease Pepsi intake.  Long: decrease weight and intake of Pepsi.             ITP Comments:  ITP Comments     Row Name 11/24/20 1316 11/24/20 1601 11/30/20 1012 12/14/20 0620 01/04/21 0959   ITP Comments Orientation complete  joining [facility] for [facility] Documentation of diagnosis can be found in [place] 10/05/2020 Completed Merritt and gym orientation. Initial ITP created and sent for review to Dr. Ottie Glazier, Medical Director. First full day of exercise!  Patient was oriented to gym and equipment including functions, settings, policies, and procedures.  Patient's individual exercise prescription and treatment plan were reviewed.  All starting workloads were established based on the results of the 6 minute walk test done at initial orientation visit.  The plan for exercise progression was also introduced and progression will be customized based on patient's performance and goals. 30 Day review completed. Medical Director ITP review done, changes made as directed, and signed approval by Medical Director. Completed initial nutrition consultaion    Row Name 01/11/21  0907 02/08/21 0702 03/08/21 0649       ITP Comments 30 day review completed. ITP sent to Dr. Zetta Bills, Medical  Director of Pulmonary Rehab. Continue with ITP unless changes are made by physician. 30 Day review completed. Medical Director ITP review done, changes made as directed, and signed approval by Medical Director. 30 Day review completed. Medical Director ITP review done, changes made as directed, and signed approval by Medical Director.              Comments:

## 2021-03-08 NOTE — Progress Notes (Signed)
Daily Session Note  Patient Details  Name: Melissa Merritt MRN: [id number] Date of Birth: 1950/05/30 Referring Provider:   Flowsheet Row Pulmonary Rehab from 11/24/2020 in [facility]  Referring Provider Ottie Glazier MD       Encounter Date: 03/08/2021  Check In:  Session Check In - 03/08/21 0955       Check-In   Supervising physician immediately available to respond to emergencies See telemetry face sheet for immediately available ER MD    Location [facility]    Staff Present Birdie Sons, MPA, Elveria Rising, BA, ACSM CEP, Exercise Physiologist;Joseph Tessie Fass, [place]    Virtual Visit No    Medication changes reported     No    Fall or balance concerns reported    No    Tobacco Cessation No Change    Warm-up and Cool-down Performed on first and last piece of equipment    Resistance Training Performed Yes    VAD Patient? No    PAD/SET Patient? No      Pain Assessment   Currently in Pain? No/denies                Social History   Tobacco Use  Smoking Status Former   Types: Cigarettes   Quit date: 07/09/1979   Years since quitting: 41.6  Smokeless Tobacco Never    Goals Met:  Independence with exercise equipment Exercise tolerated well No report of concerns or symptoms today Strength training completed today  Goals Unmet:  Not Applicable  Comments: Pt able to follow exercise prescription today without complaint.  Will continue to monitor for progression.    Dr. Emily Filbert is Medical Director for [place].  Dr. Ottie Glazier is Medical Director for [facility].

## 2021-03-09 NOTE — Patient Instructions (Signed)
Discharge Patient Instructions  Patient Details  Name: Melissa Merritt MRN: [id number] Date of Birth: [birth year] Referring Provider:  Venita Lick, NP   Number of Visits: 68  Reason for Discharge:  Patient reached a stable level of exercise. Patient independent in their exercise. Patient has met program and personal goals.  Smoking History:  Social History   Tobacco Use  Smoking Status Former   Types: Cigarettes   Quit date: 07/09/1979   Years since quitting: 41.6  Smokeless Tobacco Never    Diagnosis:  Mild persistent asthma without complication  Initial Exercise Prescription:  Initial Exercise Prescription - 11/24/20 1600       Date of Initial Exercise RX and Referring Provider   Date 11/24/20    Referring Provider Ottie Glazier MD      Recumbant Bike   Level 3    RPM 60    Watts 25    Minutes 15    METs 2.71      REL-XR   Level 3    Speed 50    Minutes 15    METs 2.71      T5 Nustep   Level 5    Minutes 15    METs 2.71      Track   Laps 40    Minutes 15    METs 3.18      Prescription Details   Frequency (times per week) 3    Duration Progress to 30 minutes of continuous aerobic without signs/symptoms of physical distress      Intensity   THRR 40-80% of Max Heartrate 108-135    Ratings of Perceived Exertion 11-13    Perceived Dyspnea 0-4      Progression   Progression Continue to progress workloads to maintain intensity without signs/symptoms of physical distress.      Resistance Training   Training Prescription Yes    Weight 5 lb    Reps 10-15             Discharge Exercise Prescription (Final Exercise Prescription Changes):  Exercise Prescription Changes - 02/27/21 1300       Response to Exercise   Blood Pressure (Admit) 128/68    Blood Pressure (Exit) 122/78    Heart Rate (Admit) 82 bpm    Heart Rate (Exercise) 101 bpm    Heart Rate (Exit) 92 bpm    Oxygen Saturation (Admit) 94 %    Oxygen Saturation (Exercise) 92  %    Oxygen Saturation (Exit) 92 %    Rating of Perceived Exertion (Exercise) 15    Perceived Dyspnea (Exercise) 2    Symptoms SOB    Duration Continue with 30 min of aerobic exercise without signs/symptoms of physical distress.    Intensity THRR unchanged      Progression   Progression Continue to progress workloads to maintain intensity without signs/symptoms of physical distress.    Average METs 2.79      Resistance Training   Training Prescription Yes    Weight 5 lb    Reps 10-15      Interval Training   Interval Training No      T5 Nustep   Level 5    Minutes 15    METs 2.3      Track   Laps 42    Minutes 15    METs 3.28      Home Exercise Plan   Plans to continue exercise at Home (comment)   walking   Frequency Add  2 additional days to program exercise sessions.    Initial Home Exercises Provided 12/30/20      Oxygen   Maintain Oxygen Saturation 88% or higher             Functional Capacity:  6 Minute Walk     Row Name 11/08/20 1508 11/24/20 1523 02/27/21 1039     6 Minute Walk   Phase Discharge Initial  Same walk test used from CARE post 6MWT Discharge   Distance 1290 feet 1290 feet 1400 feet   Distance % Change 19.7 % -- 8.5 %   Distance Feet Change 213 ft -- 110 ft   Walk Time 6 minutes 6 minutes 6 minutes   # of Rest Breaks 0 0 0   MPH 2.44 2.44 2.65   METS 2.71 2.71 3.14   RPE _0 Perceived Dyspnea  _1 VO2 Peak 9.51 9.51 11   Symptoms No No Yes (comment)   Comments -- -- SOB   Resting HR 81 bpm 81 bpm 86 bpm   Resting BP 140/76 140/76 140/80   Resting Oxygen Saturation  95 % 95 % 94 %   Exercise Oxygen Saturation  during 6 min walk 88 % 88 % 88 %   Max Ex. HR 113 bpm 113 bpm 118 bpm   Max Ex. BP 158/70 158/70 178/74   2 Minute Post BP -- 134/68 --     Interval HR   1 Minute HR -- -- 86   2 Minute HR -- -- 104   3 Minute HR -- -- 107   4 Minute HR -- -- 110   5 Minute HR -- -- 79   6 Minute HR -- -- 118   Interval  Heart Rate? -- -- Yes     Interval Oxygen   Interval Oxygen? -- -- Yes   Baseline Oxygen Saturation % -- -- 94 %   1 Minute Oxygen Saturation % -- -- 92 %   1 Minute Liters of Oxygen -- -- 0 L   2 Minute Oxygen Saturation % -- -- 90 %   2 Minute Liters of Oxygen -- -- 0 L   3 Minute Oxygen Saturation % -- -- 89 %   3 Minute Liters of Oxygen -- -- 0 L   4 Minute Oxygen Saturation % -- -- 89 %   4 Minute Liters of Oxygen -- -- 0 L   5 Minute Oxygen Saturation % -- -- 88 %   5 Minute Liters of Oxygen -- -- 0 L   6 Minute Oxygen Saturation % -- -- 89 %   6 Minute Liters of Oxygen -- -- 0 L   2 Minute Post Liters of Oxygen -- -- 0 L              Nutrition & Weight - Outcomes:  Pre Biometrics - 11/30/20 1135       Pre Biometrics   Single Leg Stand 3.04 seconds             Post Biometrics - 11/10/20 0818        Post  Biometrics   Height 5' 7.25" (1.708 m)    Weight 228 lb 6.4 oz (103.6 kg)    BMI (Calculated) 35.51             Nutrition:  Nutrition Therapy & Goals - 01/04/21 0917       Nutrition Therapy   Diet Heart  healthy , low Na, Pulmonary MNT    Protein (specify units) 120g    Fiber 25 grams    Whole Grain Foods 3 servings    Saturated Fats 12 max. grams    Fruits and Vegetables 8 servings/day    Sodium 1.5 grams      Personal Nutrition Goals   Nutrition Goal ST: include nuts/seeds with yogurt (she likes peach yoplait - no greek yogurt), limit sugar sweetened beverages. LT: meet protein/calorie needs, eat more than one meal per day    Comments Still cutting down on sugary drinks.She does not eat breakfast. L: ritz crackers or some leftover which is rare D: [place]. husband will cook a lot of times. Last night she had sloppy joes with no bun and a salad (lite honey mustard dressing) with potatoes and onions as well grilling beans. She will put kens lite honey mustard on sandwiches as well. She reports no shortness of breath during or after meals.  Weight stable. She will drink body armor drinks and other beverages that have zero sugar as she tries to reduce her consumption of sugar sweetened beverages. Discussed heart healthy eating, pulmonary MNT.      Intervention Plan   Intervention Prescribe, educate and counsel regarding individualized specific dietary modifications aiming towards targeted core components such as weight, hypertension, lipid management, diabetes, heart failure and other comorbidities.    Expected Outcomes Short Term Goal: Understand basic principles of dietary content, such as calories, fat, sodium, cholesterol and nutrients.;Short Term Goal: A plan has been developed with personal nutrition goals set during dietitian appointment.;Long Term Goal: Adherence to prescribed nutrition plan.               Goals reviewed with patient; copy given to patient.

## 2021-03-10 ENCOUNTER — Other Ambulatory Visit: Payer: Self-pay

## 2021-03-10 ENCOUNTER — Encounter: Payer: Medicare Other | Attending: Nurse Practitioner | Admitting: *Deleted

## 2021-03-10 DIAGNOSIS — J4530 Mild persistent asthma, uncomplicated: Secondary | ICD-10-CM | POA: Insufficient documentation

## 2021-03-10 NOTE — Progress Notes (Signed)
Pulmonary Individual Treatment Plan  Patient Details  Name: Melissa Merritt MRN: [id number] Date of Birth: [birth year] Referring Provider:   Flowsheet Row Pulmonary Rehab from 11/24/2020 in [facility] Cardiac and Pulmonary Rehab  Referring Provider Ottie Glazier MD       Initial Encounter Date:  Flowsheet Row Pulmonary Rehab from 11/24/2020 in [facility] Cardiac and Pulmonary Rehab  Date 11/24/20       Visit Diagnosis: Mild persistent asthma without complication  Patient's Home Medications on Admission:  Current Outpatient Medications:    amLODipine (NORVASC) 5 MG tablet, Take 1 tablet (5 mg total) by mouth daily., Disp: 90 tablet, Rfl: 4   aspirin 81 MG tablet, Take 81 mg by mouth daily., Disp: , Rfl:    Biotin 5000 MCG TABS, Take 5,000 mcg by mouth 2 (two) times daily., Disp: , Rfl:    Calcium Carb-Cholecalciferol (CALCIUM-VITAMIN D) 600-400 MG-UNIT TABS, Take 1 tablet by mouth 2 (two) times daily., Disp: , Rfl:    cholecalciferol (VITAMIN D3) 25 MCG (1000 UNIT) tablet, Take 1,000 Units by mouth in the morning and at bedtime., Disp: , Rfl:    Cyanocobalamin (B-12) 2500 MCG TABS, Take 2,500 mcg by mouth daily., Disp: , Rfl:    denosumab (PROLIA) 60 MG/ML SOSY injection, Inject 60 mg into the skin every 6 (six) months., Disp: , Rfl:    exemestane (AROMASIN) 25 MG tablet, Take 1 tablet (25 mg total) by mouth daily after breakfast., Disp: 30 tablet, Rfl: 3   ferrous sulfate 220 (44 Fe) MG/5ML solution, Take 220 mg by mouth daily., Disp: , Rfl:    furosemide (LASIX) 20 MG tablet, Take by mouth., Disp: , Rfl:    ibuprofen (ADVIL) 200 MG tablet, Take 400 mg by mouth every 6 (six) hours as needed for moderate pain. , Disp: , Rfl:    metoprolol succinate (TOPROL-XL) 25 MG 24 hr tablet, Take 1 tablet (25 mg total) by mouth daily., Disp: 90 tablet, Rfl: 4   pantoprazole (PROTONIX) 40 MG tablet, Take 1 tablet (40 mg total) by mouth 2 (two) times daily., Disp: 180 tablet, Rfl: 4   Red Yeast Rice Extract  (RED YEAST RICE PO), Take 1 tablet by mouth in the morning and at bedtime., Disp: , Rfl:    St Johns Wort 300 MG CAPS, Take 300 mg by mouth 2 (two) times daily., Disp: , Rfl:    TRELEGY ELLIPTA 100-62.5-25 MCG/INH AEPB, Inhale 1 puff into the lungs daily., Disp: , Rfl:    valsartan (DIOVAN) 160 MG tablet, Take 1 tablet (160 mg total) by mouth daily., Disp: 90 tablet, Rfl: 4  Past Medical History: Past Medical History:  Diagnosis Date   Benign tumor of lung    Breast cancer ([place])    Cancer ([place])    right breast   CHF (congestive heart failure) ([facility])    Dyspnea    GERD (gastroesophageal reflux disease)    Hyperlipidemia    Hypertension    Insomnia    Osteopenia    Personal history of radiation therapy    Plantar fasciitis    PONV (postoperative nausea and vomiting)    vomiting 2 days after lung surgery   S/P pneumonectomy    right lower lobe   Urinary incontinence     Tobacco Use: Social History   Tobacco Use  Smoking Status Former   Types: Cigarettes   Quit date: 07/09/1979   Years since quitting: 41.6  Smokeless Tobacco Never    Labs: Recent Review Scientist, physiological  Labs for ITP Cardiac and Pulmonary Rehab Latest Ref Rng & Units 11/17/2018 05/25/2019 11/23/2019 05/25/2020 11/22/2020   Cholestrol 100 - 199 mg/dL 217(H) 207(H) 207(H) 227(H) 231(H)   LDLCALC 0 - 99 mg/dL 127(H) 122(H) 128(H) 150(H) 143(H)   HDL >39 mg/dL 58 59 54 55 55   Trlycerides 0 - 149 mg/dL 160(H) 146 139 124 187(H)   Hemoglobin A1c 4.8 - 5.6 % - - 5.6 5.6 5.8(H)        Pulmonary Assessment Scores:  Pulmonary Assessment Scores     Row Name 11/24/20 1559 11/30/20 1134       ADL UCSD   ADL Phase Entry --    SOB Score total 47 47    Rest 1 1    Walk 2 2    Stairs 4 4    Bath 1 1    Dress 1 1    Shop 2 2      CAT Score   CAT Score 18 --      mMRC Score   mMRC Score -- 2             UCSD: Self-administered rating of dyspnea associated with activities of daily living  (ADLs) 6-point scale (0 = "not at all" to 5 = "maximal or unable to do because of breathlessness")  Scoring Scores range from 0 to 120.  Minimally important difference is 5 units  CAT: CAT can identify the health impairment of COPD patients and is better correlated with disease progression.  CAT has a scoring range of zero to 40. The CAT score is classified into four groups of low (less than 10), medium (10 - 20), high (21-30) and very high (31-40) based on the impact level of disease on health status. A CAT score over 10 suggests significant symptoms.  A worsening CAT score could be explained by an exacerbation, poor medication adherence, poor inhaler technique, or progression of COPD or comorbid conditions.  CAT MCID is 2 points  mMRC: mMRC (Modified Medical Research Council) Dyspnea Scale is used to assess the degree of baseline functional disability in patients of respiratory disease due to dyspnea. No minimal important difference is established. A decrease in score of 1 point or greater is considered a positive change.   Pulmonary Function Assessment:   Exercise Target Goals: Exercise Program Goal: Individual exercise prescription set using results from initial 6 min walk test and THRR while considering  patient's activity barriers and safety.   Exercise Prescription Goal: Initial exercise prescription builds to 30-45 minutes a day of aerobic activity, 2-3 days per week.  Home exercise guidelines will be given to patient during program as part of exercise prescription that the participant will acknowledge.  Education: Aerobic Exercise: - Group verbal and visual presentation on the components of exercise prescription. Introduces F.I.T.T principle from ACSM for exercise prescriptions.  Reviews F.I.T.T. principles of aerobic exercise including progression. Written material given at graduation. Flowsheet Row Pulmonary Rehab from 03/08/2021 in [facility] Cardiac and Pulmonary Rehab  Date 11/30/20   Educator [facility]  Instruction Review Code 1- Verbalizes Understanding       Education: Resistance Exercise: - Group verbal and visual presentation on the components of exercise prescription. Introduces F.I.T.T principle from ACSM for exercise prescriptions  Reviews F.I.T.T. principles of resistance exercise including progression. Written material given at graduation. Flowsheet Row Pulmonary Rehab from 03/08/2021 in [facility] Cardiac and Pulmonary Rehab  Date 12/07/20  Educator [place]  Instruction Review Code 1- Verbalizes Understanding  Education: Exercise & Equipment Safety: - Individual verbal instruction and demonstration of equipment use and safety with use of the equipment. Flowsheet Row Pulmonary Rehab from 03/08/2021 in [facility] Cardiac and Pulmonary Rehab  Education need identified 11/24/20  Date 11/24/20  Educator [place]  Instruction Review Code 1- Verbalizes Understanding       Education: Exercise Physiology & General Exercise Guidelines: - Group verbal and written instruction with models to review the exercise physiology of the cardiovascular system and associated critical values. Provides general exercise guidelines with specific guidelines to those with heart or lung disease.    Education: Flexibility, Balance, Mind/Body Relaxation: - Group verbal and visual presentation with interactive activity on the components of exercise prescription. Introduces F.I.T.T principle from ACSM for exercise prescriptions. Reviews F.I.T.T. principles of flexibility and balance exercise training including progression. Also discusses the mind body connection.  Reviews various relaxation techniques to help reduce and manage stress (i.e. Deep breathing, progressive muscle relaxation, and visualization). Balance handout provided to take home. Written material given at graduation. Flowsheet Row Pulmonary Rehab from 03/08/2021 in [facility] Cardiac and Pulmonary Rehab  Date 12/14/20  Educator [place]  Instruction  Review Code 1- Verbalizes Understanding       Activity Barriers & Risk Stratification:  Activity Barriers & Cardiac Risk Stratification - 11/24/20 1524       Activity Barriers & Cardiac Risk Stratification   Activity Barriers Shortness of Breath             6 Minute Walk:  6 Minute Walk     Row Name 11/08/20 1508 11/24/20 1523 02/27/21 1039     6 Minute Walk   Phase Discharge Initial  Same walk test used from CARE post 6MWT Discharge   Distance 1290 feet 1290 feet 1400 feet   Distance % Change 19.7 % -- 8.5 %   Distance Feet Change 213 ft -- 110 ft   Walk Time 6 minutes 6 minutes 6 minutes   # of Rest Breaks 0 0 0   MPH 2.44 2.44 2.65   METS 2.71 2.71 3.14   RPE '11 11 13   ' Perceived Dyspnea  '2 2 3   ' VO2 Peak 9.51 9.51 11   Symptoms No No Yes (comment)   Comments -- -- SOB   Resting HR 81 bpm 81 bpm 86 bpm   Resting BP 140/76 140/76 140/80   Resting Oxygen Saturation  95 % 95 % 94 %   Exercise Oxygen Saturation  during 6 min walk 88 % 88 % 88 %   Max Ex. HR 113 bpm 113 bpm 118 bpm   Max Ex. BP 158/70 158/70 178/74   2 Minute Post BP -- 134/68 --     Interval HR   1 Minute HR -- -- 86   2 Minute HR -- -- 104   3 Minute HR -- -- 107   4 Minute HR -- -- 110   5 Minute HR -- -- 79   6 Minute HR -- -- 118   Interval Heart Rate? -- -- Yes     Interval Oxygen   Interval Oxygen? -- -- Yes   Baseline Oxygen Saturation % -- -- 94 %   1 Minute Oxygen Saturation % -- -- 92 %   1 Minute Liters of Oxygen -- -- 0 L   2 Minute Oxygen Saturation % -- -- 90 %   2 Minute Liters of Oxygen -- -- 0 L   3 Minute Oxygen Saturation % -- --  89 %   3 Minute Liters of Oxygen -- -- 0 L   4 Minute Oxygen Saturation % -- -- 89 %   4 Minute Liters of Oxygen -- -- 0 L   5 Minute Oxygen Saturation % -- -- 88 %   5 Minute Liters of Oxygen -- -- 0 L   6 Minute Oxygen Saturation % -- -- 89 %   6 Minute Liters of Oxygen -- -- 0 L   2 Minute Post Liters of Oxygen -- -- 0 L            Oxygen Initial Assessment:  Oxygen Initial Assessment - 11/24/20 1559       Home Oxygen   Home Oxygen Device None    Sleep Oxygen Prescription None    Home Exercise Oxygen Prescription None    Home Resting Oxygen Prescription None      Initial 6 min Walk   Oxygen Used None      Program Oxygen Prescription   Program Oxygen Prescription None      Intervention   Short Term Goals To learn and demonstrate proper pursed lip breathing techniques or other breathing techniques. ;To learn and demonstrate proper use of respiratory medications    Long  Term Goals Demonstrates proper use of MDI's;Compliance with respiratory medication;Exhibits proper breathing techniques, such as pursed lip breathing or other method taught during program session             Oxygen Re-Evaluation:  Oxygen Re-Evaluation     [place] Name 12/30/20 1025 01/18/21 1000 02/13/21 1013         Program Oxygen Prescription   Program Oxygen Prescription None None None       Home Oxygen   Home Oxygen Device None None None     Sleep Oxygen Prescription None;CPAP  plans to start it tonight None;CPAP None;CPAP     Home Exercise Oxygen Prescription None None None     Home Resting Oxygen Prescription None None None     Compliance with Home Oxygen Use -- Yes Yes       Goals/Expected Outcomes   Short Term Goals To learn and demonstrate proper pursed lip breathing techniques or other breathing techniques. ;To learn and demonstrate proper use of respiratory medications;To learn and understand importance of maintaining oxygen saturations>88%;To learn and understand importance of monitoring SPO2 with pulse oximeter and demonstrate accurate use of the pulse oximeter. To learn and understand importance of maintaining oxygen saturations>88%;To learn and understand importance of monitoring SPO2 with pulse oximeter and demonstrate accurate use of the pulse oximeter. To learn and demonstrate proper pursed lip breathing techniques or  other breathing techniques. ;Other     Long  Term Goals Verbalizes importance of monitoring SPO2 with pulse oximeter and return demonstration;Maintenance of O2 saturations>88%;Exhibits proper breathing techniques, such as pursed lip breathing or other method taught during program session;Compliance with respiratory medication;Demonstrates proper use of MDI's Maintenance of O2 saturations>88%;Verbalizes importance of monitoring SPO2 with pulse oximeter and return demonstration Exhibits proper breathing techniques, such as pursed lip breathing or other method taught during program session;Other     Comments Melissa Merritt has been doing well in rehab.  She has recently gotten a CPAP and plans to start using it tonight.  Her breathing is getting better and she is good about using her PLB.  She swears by her inhalers!! Melissa Merritt states she has been using her CPAP more and is trying to get used to it.She has a pulse oximeter to check  her oxygen saturation at home. Informed and explained why it is important to have one. Reviewed that oxygen saturations should be 88 percent and above. Diaphragmatic and PLB breathing explained and performed with patient. Patient has a better understanding of how to do these exercises to help with breathing performance and relaxation. Patient performed breathing techniques adequately and to practice further at home.     Goals/Expected Outcomes Short: Start using CPAP Long: Continued compliance Short: monitor oxygen at home with exertion. Long: maintain oxygen saturations above 88 percent independently. Short: practice PLB and diaphragmatic breathing at home. Long: Use PLB and diaphragmatic breathing independently post [employer].              Oxygen Discharge (Final Oxygen Re-Evaluation):  Oxygen Re-Evaluation - 02/13/21 1013       Program Oxygen Prescription   Program Oxygen Prescription None      Home Oxygen   Home Oxygen Device None    Sleep Oxygen Prescription None;CPAP    Home  Exercise Oxygen Prescription None    Home Resting Oxygen Prescription None    Compliance with Home Oxygen Use Yes      Goals/Expected Outcomes   Short Term Goals To learn and demonstrate proper pursed lip breathing techniques or other breathing techniques. ;Other    Long  Term Goals Exhibits proper breathing techniques, such as pursed lip breathing or other method taught during program session;Other    Comments Diaphragmatic and PLB breathing explained and performed with patient. Patient has a better understanding of how to do these exercises to help with breathing performance and relaxation. Patient performed breathing techniques adequately and to practice further at home.    Goals/Expected Outcomes Short: practice PLB and diaphragmatic breathing at home. Long: Use PLB and diaphragmatic breathing independently post [employer].             Initial Exercise Prescription:  Initial Exercise Prescription - 11/24/20 1600       Date of Initial Exercise RX and Referring Provider   Date 11/24/20    Referring Provider Ottie Glazier MD      Recumbant Bike   Level 3    RPM 60    Watts 25    Minutes 15    METs 2.71      REL-XR   Level 3    Speed 50    Minutes 15    METs 2.71      T5 Nustep   Level 5    Minutes 15    METs 2.71      Track   Laps 40    Minutes 15    METs 3.18      Prescription Details   Frequency (times per week) 3    Duration Progress to 30 minutes of continuous aerobic without signs/symptoms of physical distress      Intensity   THRR 40-80% of Max Heartrate 108-135    Ratings of Perceived Exertion 11-13    Perceived Dyspnea 0-4      Progression   Progression Continue to progress workloads to maintain intensity without signs/symptoms of physical distress.      Resistance Training   Training Prescription Yes    Weight 5 lb    Reps 10-15             Perform Capillary Blood Glucose checks as needed.  Exercise Prescription Changes:   Exercise  Prescription Changes     Row Name 11/10/20 0800 11/24/20 1500 12/05/20 0900 12/19/20 1500 12/30/20 1000  Response to Exercise   Blood Pressure (Admit) 110/68 140/76 136/64 104/60 --   Blood Pressure (Exercise) 134/68 158/70 126/64 142/80 --   Blood Pressure (Exit) 122/68 134/68 124/70 110/62 --   Heart Rate (Admit) 81 bpm 81 bpm 76 bpm 75 bpm --   Heart Rate (Exercise) 102 bpm 113 bpm 115 bpm 102 bpm --   Heart Rate (Exit) 93 bpm 85 bpm 99 bpm 70 bpm --   Oxygen Saturation (Admit) 93 % 95 % 95 % 95 % --   Oxygen Saturation (Exercise) 92 % 88 % 89 % 90 % --   Oxygen Saturation (Exit) 94 % 97 % 93 % 94 % --   Rating of Perceived Exertion (Exercise) '13 11 15 13 ' --   Perceived Dyspnea (Exercise) '2 2 3 2 ' --   Symptoms none none none SOB --   Comments -- Pulmonary rehab walk test results (used from CARE) -- -- --   Duration Continue with 30 min of aerobic exercise without signs/symptoms of physical distress. -- Continue with 30 min of aerobic exercise without signs/symptoms of physical distress. Continue with 30 min of aerobic exercise without signs/symptoms of physical distress. --   Intensity -- -- THRR unchanged THRR unchanged --     Progression   Progression Continue to progress workloads to maintain intensity without signs/symptoms of physical distress. -- Continue to progress workloads to maintain intensity without signs/symptoms of physical distress. Continue to progress workloads to maintain intensity without signs/symptoms of physical distress. --   Average METs 2.5 -- 3 3.23 --     Resistance Training   Training Prescription Yes -- Yes Yes --   Weight 5 lb -- 5 lb 5 lb --   Reps 10-15 -- 10-15 10-15 --     Interval Training   Interval Training -- -- -- No --     Recumbant Bike   Level -- -- -- 3 --   Watts -- -- -- 22 --   Minutes -- -- -- 15 --   METs -- -- -- 2.59 --     NuStep   Level 4 -- -- -- --   Minutes 15 -- -- -- --   METs 2.3 -- -- -- --     REL-XR    Level -- -- 3 3 --   Speed -- -- 50 -- --   Minutes -- -- 15 15 --   METs -- -- 2.9 4.6 --     T5 Nustep   Level 6 -- -- -- --   Minutes 15 -- -- -- --   METs 2.4 -- -- -- --     Track   Laps 45 -- 40 27 --   Minutes 15 -- 15 15 --   METs 3.45 -- 3.18 2.47 --     Home Exercise Plan   Plans to continue exercise at -- -- -- -- Home (comment)  walking   Frequency -- -- -- -- Add 2 additional days to program exercise sessions.   Initial Home Exercises Provided -- -- -- -- 12/30/20     Oxygen   Maintain Oxygen Saturation -- -- -- -- 88% or higher    Row Name 01/02/21 0900 01/16/21 1000 01/30/21 1300 02/14/21 1400 02/27/21 1300     Response to Exercise   Blood Pressure (Admit) 128/76 126/72 134/84 132/66 128/68   Blood Pressure (Exercise) 154/64 138/64 142/70 -- --   Blood Pressure (Exit) 122/68 122/78 124/72 118/68 122/78  Heart Rate (Admit) 80 bpm 88 bpm 82 bpm 83 bpm 82 bpm   Heart Rate (Exercise) 102 bpm 112 bpm 104 bpm 109 bpm 101 bpm   Heart Rate (Exit) 93 bpm 93 bpm 87 bpm 87 bpm 92 bpm   Oxygen Saturation (Admit) 95 % 95 % 93 % 95 % 94 %   Oxygen Saturation (Exercise) 92 % 94 % 90 % 90 % 92 %   Oxygen Saturation (Exit) 93 % 94 % 95 % 94 % 92 %   Rating of Perceived Exertion (Exercise) '13 17 12 11 15   ' Perceived Dyspnea (Exercise) '2 3 3 3 2   ' Symptoms SOB SOB -- SOB SOB   Duration Continue with 30 min of aerobic exercise without signs/symptoms of physical distress. Continue with 30 min of aerobic exercise without signs/symptoms of physical distress. Continue with 30 min of aerobic exercise without signs/symptoms of physical distress. Continue with 30 min of aerobic exercise without signs/symptoms of physical distress. Continue with 30 min of aerobic exercise without signs/symptoms of physical distress.   Intensity THRR unchanged THRR unchanged THRR unchanged THRR unchanged THRR unchanged     Progression   Progression Continue to progress workloads to maintain intensity  without signs/symptoms of physical distress. Continue to progress workloads to maintain intensity without signs/symptoms of physical distress. Continue to progress workloads to maintain intensity without signs/symptoms of physical distress. Continue to progress workloads to maintain intensity without signs/symptoms of physical distress. Continue to progress workloads to maintain intensity without signs/symptoms of physical distress.   Average METs 2.73 3.09 2.9 2.79 2.79     Resistance Training   Training Prescription Yes Yes Yes Yes Yes   Weight 5 lb 5 lb 5 lb 5 lb 5 lb   Reps 10-15 10-15 10-15 10-15 10-15     Interval Training   Interval Training No No No No No     NuStep   Level -- -- -- 5 --   Minutes -- -- -- 15 --   METs -- -- -- 3.5 --     REL-XR   Level -- -- -- 4 --   Minutes -- -- -- 15 --   METs -- -- -- 2.8 --     T5 Nustep   Level '6 6 6 5 5   ' Minutes '15 15 15 15 15   ' METs 2.2 3.2 2.4 2.1 2.3     Track   Laps 45 31 45 32 42   Minutes '15 15 15 15 15   ' METs 3.45 2.69 3.28 2.74 3.28     Home Exercise Plan   Plans to continue exercise at Home (comment)  walking Home (comment)  walking Home (comment)  walking Home (comment)  walking Home (comment)  walking   Frequency Add 2 additional days to program exercise sessions. Add 2 additional days to program exercise sessions. Add 2 additional days to program exercise sessions. Add 2 additional days to program exercise sessions. Add 2 additional days to program exercise sessions.   Initial Home Exercises Provided 12/30/20 12/30/20 12/30/20 12/30/20 12/30/20     Oxygen   Maintain Oxygen Saturation 88% or higher 88% or higher 88% or higher 88% or higher 88% or higher            Exercise Comments:   Exercise Comments     Row Name 11/30/20 1012           Exercise Comments First full day of exercise!  Patient was oriented to  gym and equipment including functions, settings, policies, and procedures.  Patient's  individual exercise prescription and treatment plan were reviewed.  All starting workloads were established based on the results of the 6 minute walk test done at initial orientation visit.  The plan for exercise progression was also introduced and progression will be customized based on patient's performance and goals.                Exercise Goals and Review:   Exercise Goals     Row Name 11/24/20 1613             Exercise Goals   Increase Physical Activity Yes       Intervention Provide advice, education, support and counseling about physical activity/exercise needs.;Develop an individualized exercise prescription for aerobic and resistive training based on initial evaluation findings, risk stratification, comorbidities and participant's personal goals.       Expected Outcomes Long Term: Add in home exercise to make exercise part of routine and to increase amount of physical activity.;Short Term: Attend rehab on a regular basis to increase amount of physical activity.;Long Term: Exercising regularly at least 3-5 days a week.       Increase Strength and Stamina Yes       Intervention Provide advice, education, support and counseling about physical activity/exercise needs.;Develop an individualized exercise prescription for aerobic and resistive training based on initial evaluation findings, risk stratification, comorbidities and participant's personal goals.       Expected Outcomes Short Term: Increase workloads from initial exercise prescription for resistance, speed, and METs.;Short Term: Perform resistance training exercises routinely during rehab and add in resistance training at home;Long Term: Improve cardiorespiratory fitness, muscular endurance and strength as measured by increased METs and functional capacity (6MWT)       Able to understand and use rate of perceived exertion (RPE) scale Yes       Intervention Provide education and explanation on how to use RPE scale        Expected Outcomes Short Term: Able to use RPE daily in rehab to express subjective intensity level;Long Term:  Able to use RPE to guide intensity level when exercising independently       Able to understand and use Dyspnea scale Yes       Intervention Provide education and explanation on how to use Dyspnea scale       Expected Outcomes Short Term: Able to use Dyspnea scale daily in rehab to express subjective sense of shortness of breath during exertion;Long Term: Able to use Dyspnea scale to guide intensity level when exercising independently       Knowledge and understanding of Target Heart Rate Range (THRR) Yes       Intervention Provide education and explanation of THRR including how the numbers were predicted and where they are located for reference       Expected Outcomes Short Term: Able to state/look up THRR;Long Term: Able to use THRR to govern intensity when exercising independently;Short Term: Able to use daily as guideline for intensity in rehab       Able to check pulse independently Yes       Intervention Provide education and demonstration on how to check pulse in carotid and radial arteries.;Review the importance of being able to check your own pulse for safety during independent exercise       Expected Outcomes Short Term: Able to explain why pulse checking is important during independent exercise;Long Term: Able to check pulse independently and accurately  Understanding of Exercise Prescription Yes       Intervention Provide education, explanation, and written materials on patient's individual exercise prescription       Expected Outcomes Short Term: Able to explain program exercise prescription;Long Term: Able to explain home exercise prescription to exercise independently                Exercise Goals Re-Evaluation :  Exercise Goals Re-Evaluation     [place] Name 11/30/20 1012 12/05/20 0935 12/19/20 1517 12/30/20 1029 01/02/21 0911     Exercise Goal Re-Evaluation    Exercise Goals Review Increase Physical Activity;Able to understand and use rate of perceived exertion (RPE) scale;Knowledge and understanding of Target Heart Rate Range (THRR);Understanding of Exercise Prescription;Increase Strength and Stamina;Able to understand and use Dyspnea scale;Able to check pulse independently Increase Physical Activity;Increase Strength and Stamina Increase Physical Activity;Increase Strength and Stamina;Understanding of Exercise Prescription Increase Physical Activity;Increase Strength and Stamina;Understanding of Exercise Prescription;Able to understand and use rate of perceived exertion (RPE) scale;Able to understand and use Dyspnea scale;Knowledge and understanding of Target Heart Rate Range (THRR);Able to check pulse independently Increase Physical Activity;Increase Strength and Stamina   Comments Reviewed RPE and dyspnea scales, THR and program prescription with pt today.  Pt voiced understanding and was given a copy of goals to take home. -- Melissa Merritt is doing well in rehab.  She is 27 laps and 4.6 METs on the XR.  We will continue to monitor her progress. Updated home exercise from [facility] with pt today.  Pt plans to walk at home for exercise.  Reviewed THR, pulse, RPE, sign and symptoms, pulse oximetery and when to call 911 or MD.  Also discussed weather considerations and indoor options.  Pt voiced understanding. Melissa Merritt has progressed to 45 laps on the track! Oxygen sats have stayed in the 90s on room air.  Staff will continue to monitor progress.   Expected Outcomes Short: Use RPE daily to regulate intensity. Long: Follow program prescription in THR. -- Short: Aim for 30 laps Long: Continue to improve stamina Short: Start to add in more walking at home Long: Continue to improve stamina. Short:continue to attend consistently Long: build overall stamina    [place] Name 01/16/21 1036 01/30/21 1356 02/13/21 1018 02/27/21 1341       Exercise Goal Re-Evaluation   Exercise Goals Review  Increase Physical Activity;Increase Strength and Stamina Increase Physical Activity;Increase Strength and Stamina Increase Physical Activity;Increase Strength and Stamina;Understanding of Exercise Prescription Increase Physical Activity;Increase Strength and Stamina    Comments Melissa Merritt is doing well in rehab. She did increase to level 6 on the T5 Nustep. Her number of laps on the track vary each week and should maintain a consistency/increase over time. Will continue to monitor. Melissa Merritt does well on the T5 and track.   Oxygen stays in the 90s. She does not reach THR range and RPE is 11-12. Staff will encourage her to try new equipment or possibly intervals. Asked patient if she is going to exercise after the program. She states she would like to go to a gym close to her. She knows she is going to have to continue to exercise post [facility]. Melissa Merritt improved on her post 6MWT by 110 feet!  She will graduate in the next few sessions.    Expected Outcomes Short: Maintain consistent laps on track and continue to increase number Long: Continue to increase overall MET level Short: try new equipment Long:  build overall stamina Short: finish [facility]. Long: workout at a gym  post graduation. Short: complete LW program Long:  maintain exercise on her own             Discharge Exercise Prescription (Final Exercise Prescription Changes):  Exercise Prescription Changes - 02/27/21 1300       Response to Exercise   Blood Pressure (Admit) 128/68    Blood Pressure (Exit) 122/78    Heart Rate (Admit) 82 bpm    Heart Rate (Exercise) 101 bpm    Heart Rate (Exit) 92 bpm    Oxygen Saturation (Admit) 94 %    Oxygen Saturation (Exercise) 92 %    Oxygen Saturation (Exit) 92 %    Rating of Perceived Exertion (Exercise) 15    Perceived Dyspnea (Exercise) 2    Symptoms SOB    Duration Continue with 30 min of aerobic exercise without signs/symptoms of physical distress.    Intensity THRR unchanged      Progression    Progression Continue to progress workloads to maintain intensity without signs/symptoms of physical distress.    Average METs 2.79      Resistance Training   Training Prescription Yes    Weight 5 lb    Reps 10-15      Interval Training   Interval Training No      T5 Nustep   Level 5    Minutes 15    METs 2.3      Track   Laps 42    Minutes 15    METs 3.28      Home Exercise Plan   Plans to continue exercise at Home (comment)   walking   Frequency Add 2 additional days to program exercise sessions.    Initial Home Exercises Provided 12/30/20      Oxygen   Maintain Oxygen Saturation 88% or higher             Nutrition:  Target Goals: Understanding of nutrition guidelines, daily intake of sodium <1565m, cholesterol <2069m calories 30% from fat and 7% or less from saturated fats, daily to have 5 or more servings of fruits and vegetables.  Education: All About Nutrition: -Group instruction provided by verbal, written material, interactive activities, discussions, models, and posters to present general guidelines for heart healthy nutrition including fat, fiber, MyPlate, the role of sodium in heart healthy nutrition, utilization of the nutrition label, and utilization of this knowledge for meal planning. Follow up email sent as well. Written material given at graduation. [facility]  Date 12/28/20  [facility]ruction Review Code 1- Verbalizes Understanding       Biometrics:  Pre Biometrics - 11/30/20 1135       Pre Biometrics   Single Leg Stand 3.04 seconds             Post Biometrics - 11/10/20 0818        Post  Biometrics   Height 5' 7.25" (1.708 m)    Weight 228 lb 6.4 oz (103.6 kg)    BMI (Calculated) 35.51             Nutrition Therapy Plan and Nutrition Goals:  Nutrition Therapy & Goals - 01/04/21 0917       Nutrition Therapy   Diet Heart healthy , low Na,  Pulmonary MNT    Protein (specify units) 120g    Fiber 25 grams    Whole Grain Foods 3 servings    Saturated Fats 12 max. grams  Fruits and Vegetables 8 servings/day    Sodium 1.5 grams      Personal Nutrition Goals   Nutrition Goal ST: include nuts/seeds with yogurt (she likes peach yoplait - no greek yogurt), limit sugar sweetened beverages. LT: meet protein/calorie needs, eat more than one meal per day    Comments Still cutting down on sugary drinks.She does not eat breakfast. L: ritz crackers or some leftover which is rare D: Mykinos. husband will cook a lot of times. Last night she had sloppy joes with no bun and a salad (lite honey mustard dressing) with potatoes and onions as well grilling beans. She will put kens lite honey mustard on sandwiches as well. She reports no shortness of breath during or after meals. Weight stable. She will drink body armor drinks and other beverages that have zero sugar as she tries to reduce her consumption of sugar sweetened beverages. Discussed heart healthy eating, pulmonary MNT.      Intervention Plan   Intervention Prescribe, educate and counsel regarding individualized specific dietary modifications aiming towards targeted core components such as weight, hypertension, lipid management, diabetes, heart failure and other comorbidities.    Expected Outcomes Short Term Goal: Understand basic principles of dietary content, such as calories, fat, sodium, cholesterol and nutrients.;Short Term Goal: A plan has been developed with personal nutrition goals set during dietitian appointment.;Long Term Goal: Adherence to prescribed nutrition plan.             Nutrition Assessments:  MEDIFICTS Score Key: ?70 Need to make dietary changes  40-70 Heart Healthy Diet ? 40 Therapeutic Level Cholesterol Diet  Flowsheet Row Pulmonary Rehab from 11/24/2020 in [facility] Cardiac and Pulmonary Rehab  Picture Your Plate Total Score on Admission 57      Picture Your  Plate Scores: <06 Unhealthy dietary pattern with much room for improvement. 41-50 Dietary pattern unlikely to meet recommendations for good health and room for improvement. 51-60 More healthful dietary pattern, with some room for improvement.  >60 Healthy dietary pattern, although there may be some specific behaviors that could be improved.   Nutrition Goals Re-Evaluation:  Nutrition Goals Re-Evaluation     [place] Name 10/27/20 1246 12/30/20 0955 01/18/21 1003 02/13/21 1025       Goals   Current Weight -- -- 228 lb (103.4 kg) 227 lb (103 kg)    Nutrition Goal No goals: [facility] Cut back on sugar drinks and heart healthy eating She would like to lose weight and reduce her sugar intake. She would like to lose weight and reduce her sugar intake.    Comment She is the one that does the cooking - her husband is a potato person. She likes to go to [facility]. She drinks 12 cans of pepsi per day and has started to replace some with a fruit sweetened drink (18g CHO, 2g Sugar per serving). Discussed heart healthy eating/MyPlate and pulmonary friendly eating in addition to MNT during and after cancer treatment. She reports having a good appetite on Letrozole, but it does not make her feel good. Other medications made her throw up. She reports slipping through the cracks at the oncologist and needing to call 8/1 to follow-up; she will take this opportunity to discuss how she has been off her letrozole. She wants to get a referral to attend Pulmonary Rehab after graduating [facility]ARE. Provided handouts. Melissa Merritt is working on cutting back on her sugary drinks.  She is doing better but doesn't think she can cut it out completely.  She is  trying to eat a balance diet otherwise. Melissa Merritt is working on reducing the number of Pepsi she drinks a day. She has cut back to 12 Pepsi's a day. She knows she needs to stop drinking the sugar drinks. She has reflux and takes protonics once a day. Melissa Merritt is trying to cut back on her Pepsi intake.     Expected Outcome -- Short: Continue to cut back on soda LOng: Continue to use heart healthy guidelines. Short: cut back on Pepsi. Long: reduce to 1 Pepsi per day. Short: cut back on Pepsi. Long: reduce to 5 Pepsi per day.             Nutrition Goals Discharge (Final Nutrition Goals Re-Evaluation):  Nutrition Goals Re-Evaluation - 02/13/21 1025       Goals   Current Weight 227 lb (103 kg)    Nutrition Goal She would like to lose weight and reduce her sugar intake.    Comment Melissa Merritt is trying to cut back on her Pepsi intake.    Expected Outcome Short: cut back on Pepsi. Long: reduce to 5 Pepsi per day.             Psychosocial: Target Goals: Acknowledge presence or absence of significant depression and/or stress, maximize coping skills, provide positive support system. Participant is able to verbalize types and ability to use techniques and skills needed for reducing stress and depression.   Education: Stress, Anxiety, and Depression - Group verbal and visual presentation to define topics covered.  Reviews how body is impacted by stress, anxiety, and depression.  Also discusses healthy ways to reduce stress and to treat/manage anxiety and depression.  Written material given at graduation. Flowsheet Row Pulmonary Rehab from 03/08/2021 in [facility] Cardiac and Pulmonary Rehab  Date 01/18/21  Educator [facility]  Instruction Review Code 1- Verbalizes Understanding       Education: Sleep Hygiene -Provides group verbal and written instruction about how sleep can affect your health.  Define sleep hygiene, discuss sleep cycles and impact of sleep habits. Review good sleep hygiene tips.    Initial Review & Psychosocial Screening:  Initial Psych Review & Screening - 11/24/20 1304       Initial Review   Current issues with None Identified      Family Dynamics   Good Support System? Yes   son and his family building house next door, another son and a daughter out of town     Barriers    Psychosocial barriers to participate in program There are no identifiable barriers or psychosocial needs.;The patient should benefit from training in stress management and relaxation.      Screening Interventions   Interventions Encouraged to exercise;Provide feedback about the scores to participant;To provide support and resources with identified psychosocial needs    Expected Outcomes Short Term goal: Utilizing psychosocial counselor, staff and physician to assist with identification of specific Stressors or current issues interfering with healing process. Setting desired goal for each stressor or current issue identified.;Long Term Goal: Stressors or current issues are controlled or eliminated.;Short Term goal: Identification and review with participant of any Quality of Life or Depression concerns found by scoring the questionnaire.;Long Term goal: The participant improves quality of Life and PHQ9 Scores as seen by post scores and/or verbalization of changes             Quality of Life Scores:  Scores of 19 and below usually indicate a poorer quality of life in these areas.  A difference of  2-3 points is a clinically meaningful difference.  A difference of 2-3 points in the total score of the Quality of Life Index has been associated with significant improvement in overall quality of life, self-image, physical symptoms, and general health in studies assessing change in quality of life.  PHQ-9: Recent Review Flowsheet Data     Depression screen [facility] 2/9 11/24/2020 05/30/2020 05/25/2020 11/23/2019 05/25/2019   Decreased Interest 0 0 0 0 0   Down, Depressed, Hopeless 0 0 0 0 0    PHQ - 2 Score 0 0 0 0 0   Altered sleeping 1 - - - -   Tired, decreased energy 1 - - - -   Change in appetite 0 - - - -   Feeling bad or failure about yourself  0 - - - -   Trouble concentrating 0 - - - -   Moving slowly or fidgety/restless 0 - - - -   Suicidal thoughts 0 - - - -   PHQ-9 Score 2 - - - -    Difficult doing work/chores Not difficult at all - - - -      Interpretation of Total Score  Total Score Depression Severity:  1-4 = Minimal depression, 5-9 = Mild depression, 10-14 = Moderate depression, 15-19 = Moderately severe depression, 20-27 = Severe depression   Psychosocial Evaluation and Intervention:  Psychosocial Evaluation - 11/24/20 1318       Psychosocial Evaluation & Interventions   Comments Melissa Merritt has no barriers to entering the program. She is ready to satrt and work on her symptom control. Melissa Merritt lives with her husband. Their son and his family is building a house next door and until it is completed they are living in a camper at [employer].  Son, his wife and 25yrold twins.  Her husband has recently had a stroke, same time she was getting her cancer treatments. She states she does not feel stressed.  She has other children that live out of town.    Expected Outcomes STG: SRhettawill be able to attend all scheduled sessions, she will continue to maintain the low stress levels even with all the events happening in her life.  LTG: SMaryfranceswill continue to maintain her progression    Continue Psychosocial Services  Follow up required by staff             Psychosocial Re-Evaluation:  Psychosocial Re-Evaluation     RColfaxName 12/30/20 0294710/12/22 1010 02/13/21 1028         Psychosocial Re-Evaluation   Current issues with None Identified None Identified None Identified     Comments SCarlyon Prowsis doing well in rehab. It's grandtwins birthday today and they were up early to open presents.  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  She sleeps pretty well over all. She is going to start using her CPAP. Patient reports no issues with their current mental states, sleep, stress, depression or anxiety. Will follow up with patient in a few weeks for any changes. Patient reports no issues with their current mental states, sleep, stress,  depression or anxiety. Will follow up with patient in a few weeks for any changes.     Expected Outcomes Short: Continue to stay positive Long: Continue take care of self. Short: Continue to exercise regularly to support mental health and notify staff of any changes. Long: maintain mental health and well being through teaching of rehab or prescribed medications independently.  Short: Continue to exercise regularly to support mental health and notify staff of any changes. Long: maintain mental health and well being through teaching of rehab or prescribed medications independently.     Interventions Encouraged to attend Pulmonary Rehabilitation for the exercise Encouraged to attend Pulmonary Rehabilitation for the exercise Encouraged to attend Pulmonary Rehabilitation for the exercise     Continue Psychosocial Services  -- Follow up required by staff Follow up required by staff              Psychosocial Discharge (Final Psychosocial Re-Evaluation):  Psychosocial Re-Evaluation - 02/13/21 1028       Psychosocial Re-Evaluation   Current issues with None Identified    Comments Patient reports no issues with their current mental states, sleep, stress, depression or anxiety. Will follow up with patient in a few weeks for any changes.    Expected Outcomes Short: Continue to exercise regularly to support mental health and notify staff of any changes. Long: maintain mental health and well being through teaching of rehab or prescribed medications independently.    Interventions Encouraged to attend Pulmonary Rehabilitation for the exercise    Continue Psychosocial Services  Follow up required by staff             Education: Education Goals: Education classes will be provided on a weekly basis, covering required topics. Participant will state understanding/return demonstration of topics presented.  Learning Barriers/Preferences:  Learning Barriers/Preferences - 11/24/20 1306       Learning  Barriers/Preferences   Learning Barriers None    Learning Preferences None             General Pulmonary Education Topics:  Infection Prevention: - Provides verbal and written material to individual with discussion of infection control including proper hand washing and proper equipment cleaning during exercise session. Flowsheet Row Pulmonary Rehab from 03/08/2021 in [facility] Cardiac and Pulmonary Rehab  Education need identified 11/24/20  Date 11/24/20  Educator [place]  Instruction Review Code 1- Verbalizes Understanding       Falls Prevention: - Provides verbal and written material to individual with discussion of falls prevention and safety. Flowsheet Row Pulmonary Rehab from 03/08/2021 in [facility] Cardiac and Pulmonary Rehab  Education need identified 11/24/20  Date 11/24/20  Educator [place]  Instruction Review Code 1- Verbalizes Understanding       Chronic Lung Disease Review: - Group verbal instruction with posters, models, PowerPoint presentations and videos,  to review new updates, new respiratory medications, new advancements in procedures and treatments. Providing information on websites and "800" numbers for continued self-education. Includes information about supplement oxygen, available portable oxygen systems, continuous and intermittent flow rates, oxygen safety, concentrators, and Medicare reimbursement for oxygen. Explanation of Pulmonary Drugs, including class, frequency, complications, importance of spacers, rinsing mouth after steroid MDI's, and proper cleaning methods for nebulizers. Review of basic lung anatomy and physiology related to function, structure, and complications of lung disease. Review of risk factors. Discussion about methods for diagnosing sleep apnea and types of masks and machines for OSA. Includes a review of the use of types of environmental controls: home humidity, furnaces, filters, dust mite/pet prevention, HEPA vacuums. Discussion about weather changes,  air quality and the benefits of nasal washing. Instruction on Warning signs, infection symptoms, calling MD promptly, preventive modes, and value of vaccinations. Review of effective airway clearance, coughing and/or vibration techniques. Emphasizing that all should Create an Action Plan. Written material given at graduation. Flowsheet Row Pulmonary Rehab from 03/08/2021 in [facility] Cardiac and  Pulmonary Rehab  Date 01/11/21  Educator [facility]  Instruction Review Code 1- Verbalizes Understanding       AED/CPR: - Group verbal and written instruction with the use of models to demonstrate the basic use of the AED with the basic ABC's of resuscitation.    Anatomy and Cardiac Procedures: - Group verbal and visual presentation and models provide information about basic cardiac anatomy and function. Reviews the testing methods done to diagnose heart disease and the outcomes of the test results. Describes the treatment choices: Medical Management, Angioplasty, or Coronary Bypass Surgery for treating various heart conditions including Myocardial Infarction, Angina, Valve Disease, and Cardiac Arrhythmias.  Written material given at graduation. Flowsheet Row Pulmonary Rehab from 03/08/2021 in [facility] Cardiac and Pulmonary Rehab  Date 12/07/20  Educator SB  Instruction Review Code 1- Verbalizes Understanding       Medication Safety: - Group verbal and visual instruction to review commonly prescribed medications for heart and lung disease. Reviews the medication, class of the drug, and side effects. Includes the steps to properly store meds and maintain the prescription regimen.  Written material given at graduation. Flowsheet Row Pulmonary Rehab from 03/08/2021 in [facility] Cardiac and Pulmonary Rehab  Date 03/01/21  Educator SB  Instruction Review Code 1- Verbalizes Understanding       Other: -Provides group and verbal instruction on various topics (see comments)   Knowledge Questionnaire Score:  Knowledge  Questionnaire Score - 11/24/20 1602       Knowledge Questionnaire Score   Pre Score 16/18: Oxygen              Core Components/Risk Factors/Patient Goals at Admission:  Personal Goals and Risk Factors at Admission - 11/24/20 1614       Core Components/Risk Factors/Patient Goals on Admission    Weight Management Yes;Weight Loss    Intervention Weight Management: Develop a combined nutrition and exercise program designed to reach desired caloric intake, while maintaining appropriate intake of nutrient and fiber, sodium and fats, and appropriate energy expenditure required for the weight goal.;Weight Management: Provide education and appropriate resources to help participant work on and attain dietary goals.    Admit Weight 226 lb (102.5 kg)    Goal Weight: Short Term 220 lb (99.8 kg)    Goal Weight: Long Term 180 lb (81.6 kg)    Expected Outcomes Short Term: Continue to assess and modify interventions until short term weight is achieved;Long Term: Adherence to nutrition and physical activity/exercise program aimed toward attainment of established weight goal;Weight Loss: Understanding of general recommendations for a balanced deficit meal plan, which promotes 1-2 lb weight loss per week and includes a negative energy balance of [phone] kcal/d;Understanding recommendations for meals to include 15-35% energy as protein, 25-35% energy from fat, 35-60% energy from carbohydrates, less than 28m of dietary cholesterol, 20-35 gm of total fiber daily;Understanding of distribution of calorie intake throughout the day with the consumption of 4-5 meals/snacks    Increase knowledge of respiratory medications and ability to use respiratory devices properly  Yes    Intervention Provide education and demonstration as needed of appropriate use of medications, inhalers, and oxygen therapy.    Expected Outcomes Short Term: Achieves understanding of medications use. Understands that oxygen is a medication  prescribed by physician. Demonstrates appropriate use of inhaler and oxygen therapy.;Long Term: Maintain appropriate use of medications, inhalers, and oxygen therapy.    Hypertension Yes    Intervention Provide education on lifestyle modifcations including regular physical activity/exercise, weight management, moderate  sodium restriction and increased consumption of fresh fruit, vegetables, and low fat dairy, alcohol moderation, and smoking cessation.;Monitor prescription use compliance.    Expected Outcomes Short Term: Continued assessment and intervention until BP is < 140/98m HG in hypertensive participants. < 130/831mHG in hypertensive participants with diabetes, heart failure or chronic kidney disease.;Long Term: Maintenance of blood pressure at goal levels.    Lipids Yes    Intervention Provide education and support for participant on nutrition & aerobic/resistive exercise along with prescribed medications to achieve LDL <7027mHDL >8m31m  Expected Outcomes Short Term: Participant states understanding of desired cholesterol values and is compliant with medications prescribed. Participant is following exercise prescription and nutrition guidelines.;Long Term: Cholesterol controlled with medications as prescribed, with individualized exercise RX and with personalized nutrition plan. Value goals: LDL < 70mg43mL > 40 mg.             Education:Diabetes - Individual verbal and written instruction to review signs/symptoms of diabetes, desired ranges of glucose level fasting, after meals and with exercise. Acknowledge that pre and post exercise glucose checks will be done for 3 sessions at entry of program.   Know Your Numbers and Heart Failure: - Group verbal and visual instruction to discuss disease risk factors for cardiac and pulmonary disease and treatment options.  Reviews associated critical values for Overweight/Obesity, Hypertension, Cholesterol, and Diabetes.  Discusses basics of  heart failure: signs/symptoms and treatments.  Introduces Heart Failure Zone chart for action plan for heart failure.  Written material given at graduation. Flowsheet Row [facility]  Date 03/08/21  Educator SB  Instruction Review Code 1- Verbalizes Understanding       Core Components/Risk Factors/Patient Goals Review:   Goals and Risk Factor Review     Row Name 12/30/20 0957 01/18/21 1002 02/13/21 1021         Core Components/Risk Factors/Patient Goals Review   Personal Goals Review Weight Management/Obesity;Increase knowledge of respiratory medications and ability to use respiratory devices properly.;Improve shortness of breath with ADL's;Hypertension;Lipids Improve shortness of breath with ADL's Weight Management/Obesity     Review SandiCarlyon Prowsoing well in rehab. Her weight goes up and down and she is using her dieruetic.  She is doing better with her breathing and is now on a CPAP but hasnt't started yet.  Continue to montior blood pressures closely.  She loves her inhalers and is good about using them!! Spoke to patient about their shortness of breath and what they can do to improve. Patient has been informed of breathing techniques when starting the program. Patient is informed to tell staff if they have had any med changes and that certain meds they are taking or not taking can be causing shortness of breath. 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 She states she has been lowering her intake of Pepsi. 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     Expected Outcomes Short: Continue to work on weight loss  Long: Conitnue to monitor risk factors. Short: Attend [facility] regularly to improve shortness of breath with ADL's. Long: maintain independence with ADL's Short: lose more weight and decrease Pepsi intake.  Long: decrease weight and intake of Pepsi.               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 She states she has been lowering her intake of Pepsi. 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    Expected Outcomes Short: lose more weight and decrease Pepsi intake.  Long: decrease weight and intake of Pepsi.             ITP Comments:  ITP Comments     Row Name 11/24/20 1316 11/24/20 1601 11/30/20 1012 12/14/20 0620 01/04/21 0959   ITP Comments Orientation complete  joining Pulmonary Rehab for Asthma Documentation of diagnosis can be found in [place] 10/05/2020 Completed 6MWT and gym orientation. Initial ITP created and sent for review to Dr. Ottie Glazier, Medical Director. First full day of exercise!  Patient was oriented to gym and equipment including functions, settings, policies, and procedures.  Patient's individual exercise prescription and treatment plan were reviewed.  All starting workloads were established based on the results of the 6 minute walk test done at initial orientation visit.  The plan for exercise progression was also introduced and progression will be customized based on patient's performance and goals. 30 Day review completed. Medical Director ITP review done, changes made as directed, and signed approval by Medical Director. Completed initial nutrition consultaion    Row Name 01/11/21 0907 02/08/21 0702 03/08/21 0649       ITP Comments 30 day review completed. ITP sent to Dr. Zetta Bills, Medical Director of Pulmonary Rehab. Continue with ITP unless changes are made by physician. 30 Day review completed. Medical Director ITP review done, changes made as directed, and signed approval  by Medical Director. 30 Day review completed. Medical Director ITP review done, changes made as directed, and signed approval by Medical Director.              Comments: Discharge ITP

## 2021-03-10 NOTE — Progress Notes (Signed)
Daily Session Note  Patient Details  Name: Melissa Merritt MRN: [id number] Date of Birth: 09-Apr-1951 Referring Provider:   April Merritt Pulmonary Rehab from 11/24/2020 in [facility]  Referring Provider Melissa Glazier MD       Encounter Date: 03/10/2021  Check In:  Session Check In - 03/10/21 1040       Check-In   Supervising physician immediately available to respond to emergencies See telemetry face sheet for immediately available ER MD    Location [facility]    Staff Present Melissa Lark, RN, BSN, CCRP;Melissa [place], MA, RCEP, CCRP, CCET;Melissa Merritt, [place]    Virtual Visit No    Medication changes reported     No    Fall or balance concerns reported    No    Warm-up and Cool-down Performed on first and last piece of equipment    Resistance Training Performed Yes    VAD Patient? No    PAD/SET Patient? No      Pain Assessment   Currently in Pain? No/denies                Social History   Tobacco Use  Smoking Status Former   Types: Cigarettes   Quit date: 07/09/1979   Years since quitting: 41.6  Smokeless Tobacco Never    Goals Met:  Proper associated with RPD/PD & O2 Sat Independence with exercise equipment Exercise tolerated well Personal goals reviewed No report of concerns or symptoms today  Goals Unmet:  Not Applicable  Comments:  Melissa Merritt graduated today from  rehab with 36 sessions completed.  Details of the patient's exercise prescription and what She needs to do in order to continue the prescription and progress were discussed with patient.  Patient was given a copy of prescription and goals.  Patient verbalized understanding.  Melissa Merritt plans to continue to exercise by exercising at the [facility].    Dr. Emily Merritt is Medical Director for [place].  Dr. Ottie Merritt is Medical Director for [facility].

## 2021-03-10 NOTE — Progress Notes (Signed)
Discharge Progress Report  Patient Details  Name: Melissa Merritt MRN: [id number] Date of Birth: 16-Jul-1950 Referring Provider:   Flowsheet Row Pulmonary Rehab from 11/24/2020 in [facility]  Referring Provider Ottie Glazier MD        Number of Visits: 36  Reason for Discharge:  Patient reached a stable level of exercise. Patient independent in their exercise.  Diagnosis:  Mild persistent asthma without complication  ADL UCSD:  Pulmonary Assessment Scores     Row Name 11/24/20 1559 11/30/20 1134       ADL UCSD   ADL Phase Entry --    SOB Score total 47 47    Rest 1 1    Walk 2 2    Stairs 4 4    Bath 1 1    Dress 1 1    Shop 2 2      CAT Score   CAT Score 18 --      mMRC Score   mMRC Score -- 2             Initial Exercise Prescription:  Initial Exercise Prescription - 11/24/20 1600       Date of Initial Exercise RX and Referring Provider   Date 11/24/20    Referring Provider Ottie Glazier MD      Recumbant Bike   Level 3    RPM 60    Watts 25    Minutes 15    METs 2.71      REL-XR   Level 3    Speed 50    Minutes 15    METs 2.71      T5 Nustep   Level 5    Minutes 15    METs 2.71      Track   Laps 40    Minutes 15    METs 3.18      Prescription Details   Frequency (times per week) 3    Duration Progress to 30 minutes of continuous aerobic without signs/symptoms of physical distress      Intensity   THRR 40-80% of Max Heartrate 108-135    Ratings of Perceived Exertion 11-13    Perceived Dyspnea 0-4      Progression   Progression Continue to progress workloads to maintain intensity without signs/symptoms of physical distress.      Resistance Training   Training Prescription Yes    Weight 5 lb    Reps 10-15             Discharge Exercise Prescription (Final Exercise Prescription Changes):  Exercise Prescription Changes - 02/27/21 1300       Response to Exercise   Blood Pressure (Admit)  128/68    Blood Pressure (Exit) 122/78    Heart Rate (Admit) 82 bpm    Heart Rate (Exercise) 101 bpm    Heart Rate (Exit) 92 bpm    Oxygen Saturation (Admit) 94 %    Oxygen Saturation (Exercise) 92 %    Oxygen Saturation (Exit) 92 %    Rating of Perceived Exertion (Exercise) 15    Perceived Dyspnea (Exercise) 2    Symptoms SOB    Duration Continue with 30 min of aerobic exercise without signs/symptoms of physical distress.    Intensity THRR unchanged      Progression   Progression Continue to progress workloads to maintain intensity without signs/symptoms of physical distress.    Average METs 2.79      Horticulturist, commercial  Prescription Yes    Weight 5 lb    Reps 10-15      Interval Training   Interval Training No      T5 Nustep   Level 5    Minutes 15    METs 2.3      Track   Laps 42    Minutes 15    METs 3.28      Home Exercise Plan   Plans to continue exercise at Home (comment)   walking   Frequency Add 2 additional days to program exercise sessions.    Initial Home Exercises Provided 12/30/20      Oxygen   Maintain Oxygen Saturation 88% or higher             Functional Capacity:  6 Minute Walk     Row Name 11/08/20 1508 11/24/20 1523 02/27/21 1039     6 Minute Walk   Phase Discharge Initial  Same walk test used from CARE post 6MWT Discharge   Distance 1290 feet 1290 feet 1400 feet   Distance % Change 19.7 % -- 8.5 %   Distance Feet Change 213 ft -- 110 ft   Walk Time 6 minutes 6 minutes 6 minutes   # of Rest Breaks 0 0 0   MPH 2.44 2.44 2.65   METS 2.71 2.71 3.14   RPE 11 11 13    Perceived Dyspnea  2 2 3    VO2 Peak 9.51 9.51 11   Symptoms No No Yes (comment)   Comments -- -- SOB   Resting HR 81 bpm 81 bpm 86 bpm   Resting BP 140/76 140/76 140/80   Resting Oxygen Saturation  95 % 95 % 94 %   Exercise Oxygen Saturation  during 6 min walk 88 % 88 % 88 %   Max Ex. HR 113 bpm 113 bpm 118 bpm   Max Ex. BP 158/70 158/70 178/74   2  Minute Post BP -- 134/68 --     Interval HR   1 Minute HR -- -- 86   2 Minute HR -- -- 104   3 Minute HR -- -- 107   4 Minute HR -- -- 110   5 Minute HR -- -- 79   6 Minute HR -- -- 118   Interval Heart Rate? -- -- Yes     Interval Oxygen   Interval Oxygen? -- -- Yes   Baseline Oxygen Saturation % -- -- 94 %   1 Minute Oxygen Saturation % -- -- 92 %   1 Minute Liters of Oxygen -- -- 0 L   2 Minute Oxygen Saturation % -- -- 90 %   2 Minute Liters of Oxygen -- -- 0 L   3 Minute Oxygen Saturation % -- -- 89 %   3 Minute Liters of Oxygen -- -- 0 L   4 Minute Oxygen Saturation % -- -- 89 %   4 Minute Liters of Oxygen -- -- 0 L   5 Minute Oxygen Saturation % -- -- 88 %   5 Minute Liters of Oxygen -- -- 0 L   6 Minute Oxygen Saturation % -- -- 89 %   6 Minute Liters of Oxygen -- -- 0 L   2 Minute Post Liters of Oxygen -- -- 0 L            Nutrition:  Nutrition Therapy & Goals - 01/04/21 0917       Nutrition Therapy   Diet Heart  healthy , low Na, Pulmonary MNT    Protein (specify units) 120g    Fiber 25 grams    Whole Grain Foods 3 servings    Saturated Fats 12 max. grams    Fruits and Vegetables 8 servings/day    Sodium 1.5 grams      Personal Nutrition Goals   Nutrition Goal ST: include nuts/seeds with yogurt (she likes peach yoplait - no greek yogurt), limit sugar sweetened beverages. LT: meet protein/calorie needs, eat more than one meal per day    Comments Still cutting down on sugary drinks.She does not eat breakfast. L: ritz crackers or some leftover which is rare D: [place]. husband will cook a lot of times. Last night she had sloppy joes with no bun and a salad (lite honey mustard dressing) with potatoes and onions as well grilling beans. She will put kens lite honey mustard on sandwiches as well. She reports no shortness of breath during or after meals. Weight stable. She will drink body armor drinks and other beverages that have zero sugar as she tries to reduce  her consumption of sugar sweetened beverages. Discussed heart healthy eating, pulmonary MNT.      Intervention Plan   Intervention Prescribe, educate and counsel regarding individualized specific dietary modifications aiming towards targeted core components such as weight, hypertension, lipid management, diabetes, heart failure and other comorbidities.    Expected Outcomes Short Term Goal: Understand basic principles of dietary content, such as calories, fat, sodium, cholesterol and nutrients.;Short Term Goal: A plan has been developed with personal nutrition goals set during dietitian appointment.;Long Term Goal: Adherence to prescribed nutrition plan.             Goals reviewed with patient; copy given to patient.

## 2021-03-13 ENCOUNTER — Encounter: Payer: Self-pay | Admitting: Hematology and Oncology

## 2021-03-13 NOTE — Telephone Encounter (Signed)
Signing note, See previous note from 08/08/20 

## 2021-04-06 DIAGNOSIS — Z01818 Encounter for other preprocedural examination: Secondary | ICD-10-CM | POA: Diagnosis not present

## 2021-04-06 DIAGNOSIS — R0602 Shortness of breath: Secondary | ICD-10-CM | POA: Diagnosis not present

## 2021-04-11 ENCOUNTER — Other Ambulatory Visit: Payer: Self-pay | Admitting: Internal Medicine

## 2021-04-13 ENCOUNTER — Other Ambulatory Visit: Payer: Self-pay | Admitting: Nurse Practitioner

## 2021-04-13 NOTE — Telephone Encounter (Signed)
Requested Prescriptions  Pending Prescriptions Disp Refills   valsartan (DIOVAN) 160 MG tablet [Pharmacy Med Name: VALSARTAN 160 MG TABLET] 90 tablet 0    Sig: Take 1 tablet (160 mg total) by mouth daily.     Cardiovascular:  Angiotensin Receptor Blockers Failed - 04/13/2021 12:12 PM      Failed - Last BP in normal range    BP Readings from Last 1 Encounters:  02/14/21 (!) 157/79         Passed - Cr in normal range and within 180 days    Creatinine  Date Value Ref Range Status  11/11/2011 0.82 0.60 - 1.30 mg/dL Final   Creatinine, Ser  Date Value Ref Range Status  02/14/2021 0.81 0.44 - 1.00 mg/dL Final         Passed - K in normal range and within 180 days    Potassium  Date Value Ref Range Status  02/14/2021 4.2 3.5 - 5.1 mmol/L Final  11/11/2011 3.8 3.5 - 5.1 mmol/L Final         Passed - Patient is not pregnant      Passed - Valid encounter within last 6 months    Recent Outpatient Visits          4 months ago Class 2 severe obesity due to excess calories with serious comorbidity and body mass index (BMI) of 35.0 to 35.9 in adult [facility])   [place], Jolene T, NP   10 months ago Obstructive apnea   [place], Jolene T, NP   10 months ago Malignant neoplasm of upper-outer quadrant of right breast in female, estrogen receptor positive ([place])   [place], Jolene T, NP   1 year ago Malignant neoplasm of upper-outer quadrant of right breast in female, estrogen receptor positive ([place])   [place], Jolene T, NP   1 year ago Class 2 severe obesity due to excess calories with serious comorbidity and body mass index (BMI) of 37.0 to 37.9 in adult [facility])   [place], Jolene T, NP      Future Appointments            In 1 month [name], Barbaraann Faster, NP [facility], [facility]   In 1 month  [place], [facility]            pantoprazole (PROTONIX) 40 MG  tablet [Pharmacy Med Name: PANTOPRAZOLE SOD DR 40 MG TAB] 180 tablet 0    Sig: Take 1 tablet (40 mg total) by mouth 2 (two) times daily.     Gastroenterology: Proton Pump Inhibitors Passed - 04/13/2021 12:12 PM      Passed - Valid encounter within last 12 months    Recent Outpatient Visits          4 months ago Class 2 severe obesity due to excess calories with serious comorbidity and body mass index (BMI) of 35.0 to 35.9 in adult [facility])   [place], Henrine Screws T, NP   10 months ago Obstructive apnea   [place], Jolene T, NP   10 months ago Malignant neoplasm of upper-outer quadrant of right breast in female, estrogen receptor positive ([place])   [place], Jolene T, NP   1 year ago Malignant neoplasm of upper-outer quadrant of right breast in female, estrogen receptor positive ([place])   [place], [place] T, NP   1 year ago Class 2 severe  obesity due to excess calories with serious comorbidity and body mass index (BMI) of 37.0 to 37.9 in adult [facility])   [place], Henrine Screws T, NP      Future Appointments            In 1 month [name], Barbaraann Faster, NP [facility], [facility]   In 1 month  [facility], [facility]            metoprolol succinate (TOPROL-XL) 25 MG 24 hr tablet [Pharmacy Med Name: METOPROLOL SUCC ER 25 MG TAB] 90 tablet 0    Sig: Take 1 tablet (25 mg total) by mouth daily.     Cardiovascular:  Beta Blockers Failed - 04/13/2021 12:12 PM      Failed - Last BP in normal range    BP Readings from Last 1 Encounters:  02/14/21 (!) 157/79         Passed - Last Heart Rate in normal range    Pulse Readings from Last 1 Encounters:  02/14/21 71         Passed - Valid encounter within last 6 months    Recent Outpatient Visits          4 months ago Class 2 severe obesity due to excess calories with serious comorbidity and body mass index (BMI) of 35.0 to 35.9 in  adult [facility])   [place], [place] T, NP   10 months ago Obstructive apnea   [place], Jolene T, NP   10 months ago Malignant neoplasm of upper-outer quadrant of right breast in female, estrogen receptor positive ([place])   [place], Jolene T, NP   1 year ago Malignant neoplasm of upper-outer quadrant of right breast in female, estrogen receptor positive ([place])   [place], Jolene T, NP   1 year ago Class 2 severe obesity due to excess calories with serious comorbidity and body mass index (BMI) of 37.0 to 37.9 in adult [facility])   [place], [place] T, NP      Future Appointments            In 1 month [name], Barbaraann Faster, NP [facility] [facility], [facility]   In 1 month  [place], [facility]            amLODipine (NORVASC) 5 MG tablet [Pharmacy Med Name: AMLODIPINE BESYLATE 5 MG TAB] 90 tablet 0    Sig: Take 1 tablet (5 mg total) by mouth daily.     Cardiovascular:  Calcium Channel Blockers Failed - 04/13/2021 12:12 PM      Failed - Last BP in normal range    BP Readings from Last 1 Encounters:  02/14/21 (!) 157/79         Passed - Valid encounter within last 6 months    Recent Outpatient Visits          4 months ago Class 2 severe obesity due to excess calories with serious comorbidity and body mass index (BMI) of 35.0 to 35.9 in adult [facility]   [name], [place] T, NP   10 months ago Obstructive apnea   [place], Jolene T, NP   10 months ago Malignant neoplasm of upper-outer quadrant of right breast in female, estrogen receptor positive ([place])   [place], [place] T, NP   1 year ago Malignant neoplasm of upper-outer quadrant of right breast in female, estrogen receptor positive (  [place])   [place], [place] T, NP   1 year ago Class 2 severe obesity due to excess calories with serious  comorbidity and body mass index (BMI) of 37.0 to 37.9 in adult [facility])   [place], Barbaraann Faster, NP      Future Appointments            In 1 month [name], Barbaraann Faster, NP [facility] [facility], [facility]   In 1 month  [facility], [facility]

## 2021-05-11 ENCOUNTER — Other Ambulatory Visit: Payer: Self-pay | Admitting: *Deleted

## 2021-05-11 MED ORDER — EXEMESTANE 25 MG PO TABS: 25.0000 mg | ORAL_TABLET | Freq: Every day | ORAL | 1 refills | Status: DC

## 2021-05-19 ENCOUNTER — Encounter: Payer: Self-pay | Admitting: *Deleted

## 2021-05-21 NOTE — Patient Instructions (Signed)
Asthma, Adult ?Asthma is a long-term (chronic) condition in which the airways get tight and narrow. The airways are the breathing passages that lead from the nose and mouth down into the lungs. A person with asthma will have times when symptoms get worse. These are called asthma attacks. They can cause coughing, whistling sounds when you breathe (wheezing), shortness of breath, and chest pain. They can make it hard to breathe. There is no cure for asthma, but medicines and lifestyle changes can help control it. ?There are many things that can bring on an asthma attack or make asthma symptoms worse (triggers). Common triggers include: ?Mold. ?Dust. ?Cigarette smoke. ?Cockroaches. ?Things that can cause allergy symptoms (allergens). These include animal skin flakes (dander) and pollen from trees or grass. ?Things that pollute the air. These may include household cleaners, wood smoke, smog, or chemical odors. ?Cold air, weather changes, and wind. ?Crying or laughing hard. ?Stress. ?Certain medicines or drugs. ?Certain foods such as dried fruit, potato chips, and grape juice. ?Infections, such as a cold or the flu. ?Certain medical conditions or diseases. ?Exercise or tiring activities. ?Asthma may be treated with medicines and by staying away from the things that cause asthma attacks. Types of medicines may include: ?Controller medicines. These help prevent asthma symptoms. They are usually taken every day. ?Fast-acting reliever or rescue medicines. These quickly relieve asthma symptoms. They are used as needed and provide short-term relief. ?Allergy medicines if your attacks are brought on by allergens. ?Medicines to help control the body's defense (immune) system. ?Follow these instructions at home: ?Avoiding triggers in your home ?Change your heating and air conditioning filter often. ?Limit your use of fireplaces and wood stoves. ?Get rid of pests (such as roaches and mice) and their droppings. ?Throw away plants  if you see mold on them. ?Clean your floors. Dust regularly. Use cleaning products that do not smell. ?Have someone vacuum when you are not home. Use a vacuum cleaner with a HEPA filter if possible. ?Replace carpet with wood, tile, or vinyl flooring. Carpet can trap animal skin flakes and dust. ?Use allergy-proof pillows, mattress covers, and box spring covers. ?Wash bed sheets and blankets every week in hot water. Dry them in a dryer. ?Keep your bedroom free of any triggers. ?Avoid pets and keep windows closed when things that cause allergy symptoms are in the air. ?Use blankets that are made of polyester or cotton. ?Clean bathrooms and kitchens with bleach. If possible, have someone repaint the walls in these rooms with mold-resistant paint. Keep out of the rooms that are being cleaned and painted. ?Wash your hands often with soap and water. If soap and water are not available, use hand sanitizer. ?Do not allow anyone to smoke in your home. ?General instructions ?Take over-the-counter and prescription medicines only as told by your doctor. ?Talk with your doctor if you have questions about how or when to take your medicines. ?Make note if you need to use your medicines more often than usual. ?Do not use any products that contain nicotine or tobacco, such as cigarettes and e-cigarettes. If you need help quitting, ask your doctor. ?Stay away from secondhand smoke. ?Avoid doing things outdoors when allergen counts are high and when air quality is low. ?Wear a ski mask when doing outdoor activities in the winter. The mask should cover your nose and mouth. Exercise indoors on cold days if you can. ?Warm up before you exercise. Take time to cool down after exercise. ?Use a peak flow meter as   told by your doctor. A peak flow meter is a tool that measures how well the lungs are working. ?Keep track of the peak flow meter's readings. Write them down. ?Follow your asthma action plan. This is a written plan for taking care  of your asthma and treating your attacks. ?Make sure you get all the shots (vaccines) that your doctor recommends. Ask your doctor about a flu shot and a pneumonia shot. ?Keep all follow-up visits as told by your doctor. This is important. ?Contact a doctor if: ?You have wheezing, shortness of breath, or a cough even while taking medicine to prevent attacks. ?The mucus you cough up (sputum) is thicker than usual. ?The mucus you cough up changes from clear or white to yellow, green, gray, or bloody. ?You have problems from the medicine you are taking, such as: ?A rash. ?Itching. ?Swelling. ?Trouble breathing. ?You need reliever medicines more than 2-3 times a week. ?Your peak flow reading is still at 50-79% of your personal best after following the action plan for 1 hour. ?You have a fever. ?Get help right away if: ?You seem to be worse and are not responding to medicine during an asthma attack. ?You are short of breath even at rest. ?You get short of breath when doing very little activity. ?You have trouble eating, drinking, or talking. ?You have chest pain or tightness. ?You have a fast heartbeat. ?Your lips or fingernails start to turn blue. ?You are light-headed or dizzy, or you faint. ?Your peak flow is less than 50% of your personal best. ?You feel too tired to breathe normally. ?Summary ?Asthma is a long-term (chronic) condition in which the airways get tight and narrow. An asthma attack can make it hard to breathe. ?Asthma cannot be cured, but medicines and lifestyle changes can help control it. ?Make sure you understand how to avoid triggers and how and when to use your medicines. ?This information is not intended to replace advice given to you by your health care provider. Make sure you discuss any questions you have with your health care provider. ?Document Revised: 07/19/2019 Document Reviewed: 07/29/2019 ?Elsevier Patient Education ? 2022 Elsevier Inc. ? ?

## 2021-05-25 ENCOUNTER — Other Ambulatory Visit: Payer: Self-pay

## 2021-05-25 ENCOUNTER — Ambulatory Visit (INDEPENDENT_AMBULATORY_CARE_PROVIDER_SITE_OTHER): Payer: Medicare Other | Admitting: Nurse Practitioner

## 2021-05-25 ENCOUNTER — Other Ambulatory Visit: Payer: Self-pay | Admitting: Nurse Practitioner

## 2021-05-25 ENCOUNTER — Encounter: Payer: Self-pay | Admitting: Nurse Practitioner

## 2021-05-25 VITALS — BP 135/79 | HR 64 | Temp 98.3°F | Ht 67.25 in | Wt 227.2 lb

## 2021-05-25 DIAGNOSIS — K219 Gastro-esophageal reflux disease without esophagitis: Secondary | ICD-10-CM

## 2021-05-25 DIAGNOSIS — I519 Heart disease, unspecified: Secondary | ICD-10-CM

## 2021-05-25 DIAGNOSIS — E782 Mixed hyperlipidemia: Secondary | ICD-10-CM | POA: Diagnosis not present

## 2021-05-25 DIAGNOSIS — J449 Chronic obstructive pulmonary disease, unspecified: Secondary | ICD-10-CM

## 2021-05-25 DIAGNOSIS — Z6835 Body mass index (BMI) 35.0-35.9, adult: Secondary | ICD-10-CM

## 2021-05-25 DIAGNOSIS — K439 Ventral hernia without obstruction or gangrene: Secondary | ICD-10-CM | POA: Diagnosis not present

## 2021-05-25 DIAGNOSIS — M8588 Other specified disorders of bone density and structure, other site: Secondary | ICD-10-CM | POA: Diagnosis not present

## 2021-05-25 DIAGNOSIS — G4733 Obstructive sleep apnea (adult) (pediatric): Secondary | ICD-10-CM

## 2021-05-25 DIAGNOSIS — E538 Deficiency of other specified B group vitamins: Secondary | ICD-10-CM

## 2021-05-25 DIAGNOSIS — Z853 Personal history of malignant neoplasm of breast: Secondary | ICD-10-CM

## 2021-05-25 DIAGNOSIS — D1430 Benign neoplasm of unspecified bronchus and lung: Secondary | ICD-10-CM

## 2021-05-25 DIAGNOSIS — I10 Essential (primary) hypertension: Secondary | ICD-10-CM | POA: Diagnosis not present

## 2021-05-25 DIAGNOSIS — R7301 Impaired fasting glucose: Secondary | ICD-10-CM | POA: Diagnosis not present

## 2021-05-25 LAB — MICROALBUMIN, URINE WAIVED
Creatinine, Urine Waived: 300 mg/dL (ref 10–300)
Microalb, Ur Waived: 30 mg/L — ABNORMAL HIGH (ref 0–19)
Microalb/Creat Ratio: <30 mg/g (ref <30)

## 2021-05-25 LAB — BAYER DCA HB A1C WAIVED: HB A1C (BAYER DCA - WAIVED): 5.8 % — ABNORMAL HIGH (ref 4.8–5.6)

## 2021-05-25 NOTE — Assessment & Plan Note (Signed)
Chronic, with BP at goal today in office and on home readings.  Continue current medication regimen and adjust as needed + collaboration with cardiology.  Continue to monitor BP at home regularly and document + focus on DASH diet.  CMP, CBC, urine ALB, and TSH today.  Return in 6 months.

## 2021-05-25 NOTE — Assessment & Plan Note (Signed)
Noted on DEXA 08/04/19 -- continue Vitamin D daily + Prolia and recommend repeat DEXA in 5 years.

## 2021-05-25 NOTE — Assessment & Plan Note (Signed)
Recommend she use her CPAP 100% of the time, currently about 90%.

## 2021-05-25 NOTE — Progress Notes (Signed)
BP 135/79 (BP Location: Left Arm, Cuff Size: Normal)    Pulse 64    Temp 98.3 F (36.8 C) (Oral)    Ht 5' 7.25" (1.708 m)    Wt 227 lb 3.2 oz (103.1 kg)    SpO2 96%    BMI 35.32 kg/m    Subjective:    Patient ID: Melissa Merritt, female    DOB: 12-Jul-1950, 71 y.o.   MRN: [id number]  HPI: Melissa Merritt is a 71 y.o. female  Chief Complaint  Patient presents with   Hyperlipidemia   Hypertension   Sleep Apnea   Gastroesophageal Reflux   IFG   Cyst    Patient states she notices a knot in the top of her gut. Patient states she notices it when she is standing up (bulging) and states in the shower at night is when it bulges the most and she notices it. Patient first noticed it around Thanksgiving. Patient denies having any pain.    SKIN CYST Noticed it around Thanksgiving -- notices it more when standing up and more at night.  To abdomen just under sternal area. Duration: months Location: abdomen under sternum Painful: no Itching: no Onset: sudden Context: not changing Associated signs and symptoms: none History of skin cancer: yes History of precancerous skin lesions: no Family history of skin cancer: no   HYPERTENSION / HYPERLIPIDEMIA Medications Valsartan, Metoprolol, Amlodipine, ASA, Lasix. Followed by Dr. Josefa Half, last saw in his office 11/28/20 - returns next week.  Last echo 08/24/20 with LV function LVEF 45-50% with mild reduced left ventricular function which is consistent with previous echos dating back to 2013.    Refuses statin therapy, even after at length discussion about risk reduction.  Is taking red yeast rice.   Satisfied with current treatment? yes Duration of hypertension: chronic BP monitoring frequency: a few times a week BP range: 130/70's at home BP medication side effects: no Duration of hyperlipidemia: chronic Cholesterol medication side effects: no Cholesterol supplements: red yeast rice Medication compliance: good compliance Aspirin: yes Recent  stressors: no Recurrent headaches: no Visual changes: no Palpitations: no Dyspnea: none Chest pain: no Lower extremity edema: no Dizzy/lightheaded: no  The 10-year ASCVD risk score (Arnett DK, et al., 2019) is: 17.6%   Values used to calculate the score:     Age: 34 years     Sex: Female     Is Non-Hispanic African American: No     Diabetic: No     Tobacco smoker: No     Systolic Blood Pressure: 034 mmHg     Is BP treated: Yes     HDL Cholesterol: 55 mg/dL     Total Cholesterol: 231 mg/dL  BREAST CANCER: Being followed by oncology and receiving radiation treatment. Finished treatments 11/16/2019, continues to be followed by oncology every 3 months, last visit 02/14/21.  Taking Aromasin, currently doing well with this.  History of osteopenia on DEXA last year with oncology -- they have placed her on Prolia.   SLEEP APNEA/ASTHMA Saw pulmonary last on 04/06/21 for asthma -- taking Trelegy.  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  Finished pulmonary rehab in December 2022.  Currently has CPAP and uses about 90% of the time. Sleep apnea status: stable Duration: chronic Satisfied with current treatment?:  yes CPAP use:  no Last sleep study: February and March 2021 Treatments attempted: none Wakes feeling refreshed:  no Daytime hypersomnolence:  yes Fatigue:  yes Insomnia:  yes Good sleep hygiene:  yes Difficulty falling asleep:  yes Difficulty staying asleep:  yes Snoring bothers bed partner:  yes Observed apnea by bed partner: yes Obesity:  yes Hypertension: yes  Pulmonary hypertension:  no Coronary artery disease:  no  GERD Continues Protonix 40 MG. GERD control status: stable Satisfied with current treatment? yes Heartburn frequency: none Medication side effects: no  Medication compliance: stable Dysphagia: no Odynophagia:  no Hematemesis: no Blood in stool: no EGD: yes    IFG Noted on labs, last A1c in August was 5.8% Polydipsia/polyuria: no Visual  disturbance: no Chest pain: no Paresthesias: no  Depression screen [facility] 2/9 05/25/2021 11/24/2020 05/30/2020 05/25/2020 11/23/2019  Decreased Interest 0 0 0 0 0  Down, Depressed, Hopeless 0 0 0 0 0  PHQ - 2 Score 0 0 0 0 0  Altered sleeping 2 1 - - -  Tired, decreased energy 1 1 - - -  Change in appetite 0 0 - - -  Feeling bad or failure about yourself  0 0 - - -  Trouble concentrating 0 0 - - -  Moving slowly or fidgety/restless 0 0 - - -  Suicidal thoughts 0 0 - - -  PHQ-9 Score 3 2 - - -  Difficult doing work/chores Not difficult at all Not difficult at all - - -  Some recent data might be hidden    Relevant past medical, surgical, family and social history reviewed and updated as indicated. Interim medical history since our last visit reviewed. Allergies and medications reviewed and updated.  Review of Systems  Constitutional:  Negative for activity change, appetite change, diaphoresis, fatigue and fever.  Respiratory:  Negative for cough, chest tightness and shortness of breath.   Cardiovascular:  Negative for chest pain, palpitations and leg swelling.  Gastrointestinal: Negative.   Endocrine: Negative.   Psychiatric/Behavioral: Negative.     Per HPI unless specifically indicated above     Objective:    BP 135/79 (BP Location: Left Arm, Cuff Size: Normal)    Pulse 64    Temp 98.3 F (36.8 C) (Oral)    Ht 5' 7.25" (1.708 m)    Wt 227 lb 3.2 oz (103.1 kg)    SpO2 96%    BMI 35.32 kg/m   Wt Readings from Last 3 Encounters:  05/25/21 227 lb 3.2 oz (103.1 kg)  02/14/21 227 lb (103 kg)  12/21/20 227 lb 1.6 oz (103 kg)    Physical Exam Vitals and nursing note reviewed.  Constitutional:      General: She is awake. She is not in acute distress.    Appearance: She is well-developed. She is obese. She is not ill-appearing.  HENT:     Head: Normocephalic.     Right Ear: Hearing normal.     Left Ear: Hearing normal.  Eyes:     General: Lids are normal.        Right eye: No  discharge.        Left eye: No discharge.     Conjunctiva/sclera: Conjunctivae normal.     Pupils: Pupils are equal, round, and reactive to light.  Neck:     Thyroid: No thyromegaly.     Vascular: No carotid bruit.  Cardiovascular:     Rate and Rhythm: Normal rate and regular rhythm.     Heart sounds: Normal heart sounds. No murmur heard.   No gallop.  Pulmonary:     Effort: Pulmonary effort is normal. No accessory muscle usage or respiratory distress.  Breath sounds: Normal breath sounds.  Abdominal:     General: Bowel sounds are normal. There are no signs of injury.     Palpations: Abdomen is soft.     Tenderness: There is no abdominal tenderness.     Hernia: A hernia is present. Hernia is present in the ventral area (midline upper aspect at 12 o'clock suspect ventral hernia).  Musculoskeletal:     Cervical back: Normal range of motion and neck supple.     Right lower leg: No edema.     Left lower leg: No edema.  Skin:    General: Skin is warm and dry.  Neurological:     Mental Status: She is alert and oriented to person, place, and time.  Psychiatric:        Attention and Perception: Attention normal.        Mood and Affect: Mood normal.        Speech: Speech normal.        Behavior: Behavior normal. Behavior is cooperative.   Results for orders placed or performed in visit on 05/25/21  Bayer DCA Hb A1c Waived  Result Value Ref Range   HB A1C (BAYER DCA - WAIVED) 5.8 (H) 4.8 - 5.6 %  Microalbumin, Urine Waived  Result Value Ref Range   Microalb, Ur Waived 30 (H) 0 - 19 mg/L   Creatinine, Urine Waived 300 10 - 300 mg/dL   Microalb/Creat Ratio <30 <30 mg/g      Assessment & Plan:   Problem List Items Addressed This Visit       Cardiovascular and Mediastinum   Essential hypertension    Chronic, with BP at goal today in office and on home readings.  Continue current medication regimen and adjust as needed + collaboration with cardiology.  Continue to monitor BP at  home regularly and document + focus on DASH diet.  CMP, CBC, urine ALB, and TSH today.  Return in 6 months.      Relevant Orders   Microalbumin, Urine Waived (Completed)   CBC with Differential/Platelet   Comprehensive metabolic panel   TSH   Left ventricular dysfunction    Noted on echo, continue collaboration with cardiology and current medication regimen.        Respiratory   Asthma-COPD overlap syndrome (HCC) - Primary    Chronic, stable with Trelegy.  Continue collaboration with pulmonary and current medications as prescribed by them.      Relevant Orders   CBC with Differential/Platelet   Benign tumor of lung    Yearly CXR ordered.      Relevant Orders   DG Chest 2 View   Obstructive apnea    Recommend she use her CPAP 100% of the time, currently about 90%.        Digestive   GERD (gastroesophageal reflux disease)    Chronic, stable with Protonix.  Continue current medication regimen.  Mag level today and annually.  Trial time off in future and assess if tolerated.      Relevant Orders   Magnesium     Endocrine   IFG (impaired fasting glucose)    A1C today for recheck == A1c remains 5.8%.  Continue diet focus at home.      Relevant Orders   Bayer DCA Hb A1c Waived (Completed)   Microalbumin, Urine Waived (Completed)     Musculoskeletal and Integument   Osteopenia of lumbar spine    Noted on DEXA 08/04/19 -- continue Vitamin D daily + Prolia  and recommend repeat DEXA in 5 years.      Relevant Orders   VITAMIN D 25 Hydroxy (Vit-D Deficiency, Fractures)     Other   History of breast cancer    Ongoing, continue collaboration with oncology.  Recent notes reviewed.      Hyperlipidemia    Chronic, ongoing.  Again reviewed ASCVD score with patient and discussed risks, she refuses statin, does not wish to take.  Continue red yeast rice and obtain labs today.  Return in 6 months.      Relevant Orders   Comprehensive metabolic panel   Lipid Panel w/o  Chol/HDL Ratio   Obesity    BMI 35.52.  Recommended eating smaller high protein, low fat meals more frequently and exercising 30 mins a day 5 times a week with a goal of 10-15lb weight loss in the next 3 months. Patient voiced their understanding and motivation to adhere to these recommendations.       Ventral hernia without obstruction or gangrene    Suspect small ventral hernia present at 12 o'clock upper mid abdomen, reduces easily and no pain.  Obtain imaging and monitor at this time.  If symptoms present then send to general surgery.  Recommend modest weight loss.      Relevant Orders   US Abdomen Limited   Other Visit Diagnoses     B12 deficiency       History of low levels, takes supplement, check level today and adjust supplement as needed.   Relevant Orders   Vitamin B12        Follow up plan: Return in about 6 months (around 11/22/2021) for HN/HLD, OSA, H/O BREAST CA, IFG.

## 2021-05-25 NOTE — Assessment & Plan Note (Signed)
BMI 35.52.  Recommended eating smaller high protein, low fat meals more frequently and exercising 30 mins a day 5 times a week with a goal of 10-15lb weight loss in the next 3 months. Patient voiced their understanding and motivation to adhere to these recommendations.

## 2021-05-25 NOTE — Assessment & Plan Note (Signed)
Chronic, stable with Protonix.  Continue current medication regimen.  Mag level today and annually.  Trial time off in future and assess if tolerated.

## 2021-05-25 NOTE — Assessment & Plan Note (Signed)
Noted on echo, continue collaboration with cardiology and current medication regimen. 

## 2021-05-25 NOTE — Assessment & Plan Note (Signed)
Chronic, ongoing.  Again reviewed ASCVD score with patient and discussed risks, she refuses statin, does not wish to take.  Continue red yeast rice and obtain labs today.  Return in 6 months. 

## 2021-05-25 NOTE — Assessment & Plan Note (Signed)
A1C today for recheck == A1c remains 5.8%.  Continue diet focus at home. 

## 2021-05-25 NOTE — Telephone Encounter (Signed)
Requested medication (s) are due for refill today: has all these meds filled 04/13/21 for 90 days  Requested medication (s) are on the active medication list: yes  Last refill:  04/13/21 for 90 day supply on all four meds  Future visit scheduled: 11/22/21, just seen today  Notes to clinic:  will need refills prior to Aug appt but has 3 month rx signed in Jan. Please assess.  Requested Prescriptions  Pending Prescriptions Disp Refills   pantoprazole (PROTONIX) 40 MG tablet 180 tablet 0    Sig: Take 1 tablet (40 mg total) by mouth 2 (two) times daily.     Gastroenterology: Proton Pump Inhibitors Passed - 05/25/2021 11:24 AM      Passed - Valid encounter within last 12 months    Recent Outpatient Visits           Today Asthma-COPD overlap syndrome ([place])   [place], Jolene T, NP   6 months ago Class 2 severe obesity due to excess calories with serious comorbidity and body mass index (BMI) of 35.0 to 35.9 in adult [facility])   [place], Henrine Screws T, NP   11 months ago Obstructive apnea   [place], Henrine Screws T, NP   1 year ago Malignant neoplasm of upper-outer quadrant of right breast in female, estrogen receptor positive ([place])   [place], Jolene T, NP   1 year ago Malignant neoplasm of upper-outer quadrant of right breast in female, estrogen receptor positive ([place])   [place], Jolene T, NP       Future Appointments             In 1 week  [place], [facility]   In 6 months [name], [place] T, NP [facility], [facility]             valsartan (DIOVAN) 160 MG tablet 90 tablet 0    Sig: Take 1 tablet (160 mg total) by mouth daily.     Cardiovascular:  Angiotensin Receptor Blockers Passed - 05/25/2021 11:24 AM      Passed - Cr in normal range and within 180 days    Creatinine  Date Value Ref Range Status  11/11/2011 0.82 0.60 - 1.30 mg/dL Final    Creatinine, Ser  Date Value Ref Range Status  02/14/2021 0.81 0.44 - 1.00 mg/dL Final          Passed - K in normal range and within 180 days    Potassium  Date Value Ref Range Status  02/14/2021 4.2 3.5 - 5.1 mmol/L Final  11/11/2011 3.8 3.5 - 5.1 mmol/L Final          Passed - Patient is not pregnant      Passed - Last BP in normal range    BP Readings from Last 1 Encounters:  05/25/21 135/79          Passed - Valid encounter within last 6 months    Recent Outpatient Visits           Today Asthma-COPD overlap syndrome ([place])   [place], Jolene T, NP   6 months ago Class 2 severe obesity due to excess calories with serious comorbidity and body mass index (BMI) of 35.0 to 35.9 in adult [facility])   [place] [name], Henrine Screws T, NP   11 months ago Obstructive apnea   [place], Henrine Screws T, NP   1 year ago  Malignant neoplasm of upper-outer quadrant of right breast in female, estrogen receptor positive ([place])   [place], [place] T, NP   1 year ago Malignant neoplasm of upper-outer quadrant of right breast in female, estrogen receptor positive ([place])   [place], [place]over[place] T, NP       Future Appointments             In 1 week  [place], [facility]   In 6 months [name], [place] T, NP [employer], [facility]             metoprolol succinate (TOPROL-XL) 25 MG 24 hr tablet 90 tablet 0    Sig: Take 1 tablet (25 mg total) by mouth daily.     Cardiovascular:  Beta Blockers Passed - 05/25/2021 11:24 AM      Passed - Last BP in normal range    BP Readings from Last 1 Encounters:  05/25/21 135/79          Passed - Last Heart Rate in normal range    Pulse Readings from Last 1 Encounters:  05/25/21 64          Passed - Valid encounter within last 6 months    Recent Outpatient Visits           Today Asthma-COPD overlap syndrome ([place])   [place], Jolene T, NP   6 months ago Class 2 severe obesity due to excess calories with serious comorbidity and body mass index (BMI) of 35.0 to 35.9 in adult [facility])   [place] [name], Henrine Screws T, NP   11 months ago Obstructive apnea   [place], [place] T, NP   1 year ago Malignant neoplasm of upper-outer quadrant of right breast in female, estrogen receptor positive ([place])   [place], Jolene T, NP   1 year ago Malignant neoplasm of upper-outer quadrant of right breast in female, estrogen receptor positive ([place])   [place], Jolene T, NP       Future Appointments             In 1 week  [place], [facility]   In 6 months [name], [place] T, NP [facility], [facility]             amLODipine (NORVASC) 5 MG tablet 90 tablet 0    Sig: Take 1 tablet (5 mg total) by mouth daily.     Cardiovascular: Calcium Channel Blockers 2 Passed - 05/25/2021 11:24 AM      Passed - Last BP in normal range    BP Readings from Last 1 Encounters:  05/25/21 135/79          Passed - Last Heart Rate in normal range    Pulse Readings from Last 1 Encounters:  05/25/21 64          Passed - Valid encounter within last 6 months    Recent Outpatient Visits           Today Asthma-COPD overlap syndrome ([place])   [place], Jolene T, NP   6 months ago Class 2 severe obesity due to excess calories with serious comorbidity and body mass index (BMI) of 35.0 to 35.9 in adult [facility])   [place] [name], Henrine Screws T, NP   11 months ago Obstructive apnea   [place], Henrine Screws T, NP   1 year ago Malignant neoplasm of  upper-outer quadrant of right breast in female, estrogen receptor positive ([place])   [place], [place] T, NP   1 year ago Malignant neoplasm of upper-outer quadrant of right breast in female, estrogen receptor positive  ([place])   [place], Barbaraann Faster, NP       Future Appointments             In 1 week  [place], [place]   In 6 months [name], Barbaraann Faster, NP [facility], [facility]

## 2021-05-25 NOTE — Assessment & Plan Note (Signed)
Suspect small ventral hernia present at 12 o'clock upper mid abdomen, reduces easily and no pain.  Obtain imaging and monitor at this time.  If symptoms present then send to general surgery.  Recommend modest weight loss.

## 2021-05-25 NOTE — Assessment & Plan Note (Signed)
Chronic, stable with Trelegy.  Continue collaboration with pulmonary and current medications as prescribed by them.

## 2021-05-25 NOTE — Assessment & Plan Note (Signed)
Yearly CXR ordered.

## 2021-05-25 NOTE — Telephone Encounter (Signed)
Medication Refill - Medication:  valsartan (DIOVAN) 160 MG tablet  pantoprazole (PROTONIX) 40 MG tablet  metoprolol succinate (TOPROL-XL) 25 MG 24 hr tablet  amLODipine (NORVASC) 5 MG tablet   Has the patient contacted their pharmacy? Yes.   Contact PCP  Preferred Pharmacy (with phone number or street name):  [facility] [address]  [place], [place] [address]  Phone:  [phone]  Fax:  [phone]   Has the patient been seen for an appointment in the last year OR does the patient have an upcoming appointment? Yes.    Agent: Please be advised that RX refills may take up to 3 business days. We ask that you follow-up with your pharmacy.

## 2021-05-25 NOTE — Assessment & Plan Note (Signed)
Ongoing, continue collaboration with oncology.  Recent notes reviewed. 

## 2021-05-26 LAB — CBC WITH DIFFERENTIAL/PLATELET
Basophils Absolute: 0.1 10*3/uL (ref 0.0–0.2)
Basos: 1 %
EOS (ABSOLUTE): 0.3 10*3/uL (ref 0.0–0.4)
Eos: 5 %
Hematocrit: 42.1 % (ref 34.0–46.6)
Hemoglobin: 14.2 g/dL (ref 11.1–15.9)
Immature Grans (Abs): 0.1 10*3/uL (ref 0.0–0.1)
Immature Granulocytes: 1 %
Lymphocytes Absolute: 1.4 10*3/uL (ref 0.7–3.1)
Lymphs: 19 %
MCH: 29.8 pg (ref 26.6–33.0)
MCHC: 33.7 g/dL (ref 31.5–35.7)
MCV: 88 fL (ref 79–97)
Monocytes Absolute: 0.7 10*3/uL (ref 0.1–0.9)
Monocytes: 10 %
Neutrophils Absolute: 4.6 10*3/uL (ref 1.4–7.0)
Neutrophils: 64 %
Platelets: 229 10*3/uL (ref 150–450)
RBC: 4.76 x10E6/uL (ref 3.77–5.28)
RDW: 13.7 % (ref 11.7–15.4)
WBC: 7.2 10*3/uL (ref 3.4–10.8)

## 2021-05-26 LAB — COMPREHENSIVE METABOLIC PANEL
ALT: 14 IU/L (ref 0–32)
AST: 15 IU/L (ref 0–40)
Albumin/Globulin Ratio: 2.1 (ref 1.2–2.2)
Albumin: 4.5 g/dL (ref 3.7–4.7)
Alkaline Phosphatase: 78 IU/L (ref 44–121)
BUN/Creatinine Ratio: 19 (ref 12–28)
BUN: 19 mg/dL (ref 8–27)
Bilirubin Total: 0.4 mg/dL (ref 0.0–1.2)
CO2: 24 mmol/L (ref 20–29)
Calcium: 9.9 mg/dL (ref 8.7–10.3)
Chloride: 101 mmol/L (ref 96–106)
Creatinine, Ser: 1.01 mg/dL — ABNORMAL HIGH (ref 0.57–1.00)
Globulin, Total: 2.1 g/dL (ref 1.5–4.5)
Glucose: 97 mg/dL (ref 70–99)
Potassium: 4.1 mmol/L (ref 3.5–5.2)
Sodium: 139 mmol/L (ref 134–144)
Total Protein: 6.6 g/dL (ref 6.0–8.5)
eGFR: 59 mL/min/{1.73_m2} — ABNORMAL LOW (ref >59)

## 2021-05-26 LAB — VITAMIN D 25 HYDROXY (VIT D DEFICIENCY, FRACTURES): Vit D, 25-Hydroxy: 82.5 ng/mL (ref 30.0–100.0)

## 2021-05-26 LAB — LIPID PANEL W/O CHOL/HDL RATIO
Cholesterol, Total: 213 mg/dL — ABNORMAL HIGH (ref 100–199)
HDL: 51 mg/dL (ref >39)
LDL Chol Calc (NIH): 143 mg/dL — ABNORMAL HIGH (ref 0–99)
Triglycerides: 104 mg/dL (ref 0–149)
VLDL Cholesterol Cal: 19 mg/dL (ref 5–40)

## 2021-05-26 LAB — TSH: TSH: 3.53 u[IU]/mL (ref 0.450–4.500)

## 2021-05-26 LAB — VITAMIN B12: Vitamin B-12: 1848 pg/mL — ABNORMAL HIGH (ref 232–1245)

## 2021-05-26 LAB — MAGNESIUM: Magnesium: 2.3 mg/dL (ref 1.6–2.3)

## 2021-05-26 MED ORDER — VALSARTAN 160 MG PO TABS: 160.0000 mg | ORAL_TABLET | Freq: Every day | ORAL | 4 refills | Status: DC

## 2021-05-26 MED ORDER — PANTOPRAZOLE SODIUM 40 MG PO TBEC: 40.0000 mg | DELAYED_RELEASE_TABLET | Freq: Two times a day (BID) | ORAL | 4 refills | Status: DC

## 2021-05-26 MED ORDER — AMLODIPINE BESYLATE 5 MG PO TABS: 5.0000 mg | ORAL_TABLET | Freq: Every day | ORAL | 4 refills | Status: DC

## 2021-05-26 MED ORDER — METOPROLOL SUCCINATE ER 25 MG PO TB24
25.0000 mg | ORAL_TABLET | Freq: Every day | ORAL | 4 refills | Status: DC
Start: 2021-05-26 — End: 2022-05-24

## 2021-05-26 NOTE — Progress Notes (Signed)
Good afternoon crew, please let Melissa Merritt know her labs have returned and overall remain stable with a few exceptions.  I would recommend cutting back on B12 to every other day, as level a little too high.  Cholesterol levels remain elevated, continue red yeast rice.  Kidney function shows very mild decline I would recommend drinking more water at home and we will recheck next visit.  Any questions? Keep being amazing!!  Thank you for allowing me to participate in your care.  I appreciate you. Kindest regards, Jourdin Gens

## 2021-05-31 ENCOUNTER — Ambulatory Visit
Admission: RE | Admit: 2021-05-31 | Discharge: 2021-05-31 | Disposition: A | Discharge status: Home / Self Care | Payer: Medicare Other | Source: Ambulatory Visit | Attending: Nurse Practitioner | Admitting: Nurse Practitioner

## 2021-05-31 ENCOUNTER — Other Ambulatory Visit: Payer: Self-pay

## 2021-05-31 DIAGNOSIS — Z0389 Encounter for observation for other suspected diseases and conditions ruled out: Secondary | ICD-10-CM | POA: Diagnosis not present

## 2021-05-31 DIAGNOSIS — K439 Ventral hernia without obstruction or gangrene: Secondary | ICD-10-CM | POA: Diagnosis not present

## 2021-05-31 DIAGNOSIS — M2392 Unspecified internal derangement of left knee: Secondary | ICD-10-CM | POA: Diagnosis not present

## 2021-05-31 DIAGNOSIS — M25562 Pain in left knee: Secondary | ICD-10-CM | POA: Diagnosis not present

## 2021-06-01 ENCOUNTER — Ambulatory Visit (INDEPENDENT_AMBULATORY_CARE_PROVIDER_SITE_OTHER): Payer: Medicare Other | Admitting: *Deleted

## 2021-06-01 DIAGNOSIS — Z0000 Encounter for general adult medical examination without abnormal findings: Secondary | ICD-10-CM | POA: Diagnosis not present

## 2021-06-01 DIAGNOSIS — I519 Heart disease, unspecified: Secondary | ICD-10-CM | POA: Diagnosis not present

## 2021-06-01 DIAGNOSIS — R600 Localized edema: Secondary | ICD-10-CM | POA: Diagnosis not present

## 2021-06-01 DIAGNOSIS — I10 Essential (primary) hypertension: Secondary | ICD-10-CM | POA: Diagnosis not present

## 2021-06-01 DIAGNOSIS — R0602 Shortness of breath: Secondary | ICD-10-CM | POA: Diagnosis not present

## 2021-06-01 NOTE — Patient Instructions (Addendum)
[name] ,  Thank you for taking time to come for your Medicare Wellness Visit. I appreciate your ongoing commitment to your health goals. Please review the following plan we discussed and let me know if I can assist you in the future.   Screening recommendations/referrals: Colonoscopy: up to date Mammogram: Education provided Bone Density: education provided Recommended yearly ophthalmology/optometry visit for glaucoma screening and checkup Recommended yearly dental visit for hygiene and checkup  Vaccinations: Influenza vaccine: up to date Pneumococcal vaccine: up to date Tdap vaccine: up to date     Shingles vaccine: Education provided    Advanced directives: copy requested  Conditions/risks identified:   Next appointment: 11-22-2021 @ 8:00  Melissa Merritt   Preventive Care 73 Years and Older, Female Preventive care refers to lifestyle choices and visits with your health care provider that can promote health and wellness. What does preventive care include? A yearly physical exam. This is also called an annual well check. Dental exams once or twice a year. Routine eye exams. Ask your health care provider how often you should have your eyes checked. Personal lifestyle choices, including: Daily care of your teeth and gums. Regular physical activity. Eating a healthy diet. Avoiding tobacco and drug use. Limiting alcohol use. Practicing safe sex. Taking low-dose aspirin every day. Taking vitamin and mineral supplements as recommended by your health care provider. What happens during an annual well check? The services and screenings done by your health care provider during your annual well check will depend on your age, overall health, lifestyle risk factors, and family history of disease. Counseling  Your health care provider may ask you questions about your: Alcohol use. Tobacco use. Drug use. Emotional well-being. Home and relationship well-being. Sexual activity. Eating  habits. History of falls. Memory and ability to understand (cognition). Work and work Statistician. Reproductive health. Screening  You may have the following tests or measurements: Height, weight, and BMI. Blood pressure. Lipid and cholesterol levels. These may be checked every 5 years, or more frequently if you are over 34 years old. Skin check. Lung cancer screening. You may have this screening every year starting at age 38 if you have a 30-pack-year history of smoking and currently smoke or have quit within the past 15 years. Fecal occult blood test (FOBT) of the stool. You may have this test every year starting at age 73. Flexible sigmoidoscopy or colonoscopy. You may have a sigmoidoscopy every 5 years or a colonoscopy every 10 years starting at age 77. Hepatitis C blood test. Hepatitis B blood test. Sexually transmitted disease (STD) testing. Diabetes screening. This is done by checking your blood sugar (glucose) after you have not eaten for a while (fasting). You may have this done every 1-3 years. Bone density scan. This is done to screen for osteoporosis. You may have this done starting at age 73. Mammogram. This may be done every 1-2 years. Talk to your health care provider about how often you should have regular mammograms. Talk with your health care provider about your test results, treatment options, and if necessary, the need for more tests. Vaccines  Your health care provider may recommend certain vaccines, such as: Influenza vaccine. This is recommended every year. Tetanus, diphtheria, and acellular pertussis (Tdap, Td) vaccine. You may need a Td booster every 10 years. Zoster vaccine. You may need this after age 29. Pneumococcal 13-valent conjugate (PCV13) vaccine. One dose is recommended after age 43. Pneumococcal polysaccharide (PPSV23) vaccine. One dose is recommended after age 78. Talk to  your health care provider about which screenings and vaccines you need and how  often you need them. This information is not intended to replace advice given to you by your health care provider. Make sure you discuss any questions you have with your health care provider. Document Released: 04/22/2015 Document Revised: 12/14/2015 Document Reviewed: 01/25/2015 Elsevier Interactive Patient Education  2017 [place] Prevention in the Home Falls can cause injuries. They can happen to people of all ages. There are many things you can do to make your home safe and to help prevent falls. What can I do on the outside of my home? Regularly fix the edges of walkways and driveways and fix any cracks. Remove anything that might make you trip as you walk through a door, such as a raised step or threshold. Trim any bushes or trees on the path to your home. Use bright outdoor lighting. Clear any walking paths of anything that might make someone trip, such as rocks or tools. Regularly check to see if handrails are loose or broken. Make sure that both sides of any steps have handrails. Any raised decks and porches should have guardrails on the edges. Have any leaves, snow, or ice cleared regularly. Use sand or salt on walking paths during winter. Clean up any spills in your garage right away. This includes oil or grease spills. What can I do in the bathroom? Use night lights. Install grab bars by the toilet and in the tub and shower. Do not use towel bars as grab bars. Use non-skid mats or decals in the tub or shower. If you need to sit down in the shower, use a plastic, non-slip stool. Keep the floor dry. Clean up any water that spills on the floor as soon as it happens. Remove soap buildup in the tub or shower regularly. Attach bath mats securely with double-sided non-slip rug tape. Do not have throw rugs and other things on the floor that can make you trip. What can I do in the bedroom? Use night lights. Make sure that you have a light by your bed that is easy to  reach. Do not use any sheets or blankets that are too big for your bed. They should not hang down onto the floor. Have a firm chair that has side arms. You can use this for support while you get dressed. Do not have throw rugs and other things on the floor that can make you trip. What can I do in the kitchen? Clean up any spills right away. Avoid walking on wet floors. Keep items that you use a lot in easy-to-reach places. If you need to reach something above you, use a strong step stool that has a grab bar. Keep electrical cords out of the way. Do not use floor polish or wax that makes floors slippery. If you must use wax, use non-skid floor wax. Do not have throw rugs and other things on the floor that can make you trip. What can I do with my stairs? Do not leave any items on the stairs. Make sure that there are handrails on both sides of the stairs and use them. Fix handrails that are broken or loose. Make sure that handrails are as long as the stairways. Check any carpeting to make sure that it is firmly attached to the stairs. Fix any carpet that is loose or worn. Avoid having throw rugs at the top or bottom of the stairs. If you do have throw rugs, attach  them to the floor with carpet tape. Make sure that you have a light switch at the top of the stairs and the bottom of the stairs. If you do not have them, ask someone to add them for you. What else can I do to help prevent falls? Wear shoes that: Do not have high heels. Have rubber bottoms. Are comfortable and fit you well. Are closed at the toe. Do not wear sandals. If you use a stepladder: Make sure that it is fully opened. Do not climb a closed stepladder. Make sure that both sides of the stepladder are locked into place. Ask someone to hold it for you, if possible. Clearly mark and make sure that you can see: Any grab bars or handrails. First and last steps. Where the edge of each step is. Use tools that help you move  around (mobility aids) if they are needed. These include: Canes. Walkers. Scooters. Crutches. Turn on the lights when you go into a dark area. Replace any light bulbs as soon as they burn out. Set up your furniture so you have a clear path. Avoid moving your furniture around. If any of your floors are uneven, fix them. If there are any pets around you, be aware of where they are. Review your medicines with your doctor. Some medicines can make you feel dizzy. This can increase your chance of falling. Ask your doctor what other things that you can do to help prevent falls. This information is not intended to replace advice given to you by your health care provider. Make sure you discuss any questions you have with your health care provider. Document Released: 01/20/2009 Document Revised: 09/01/2015 Document Reviewed: 04/30/2014 Elsevier Interactive Patient Education  2017 [employer].

## 2021-06-01 NOTE — Progress Notes (Signed)
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  I know you are no symptomatic right now, but would you like to see general surgery for this to establish care in case surgery needed in future?  Let me know and I can place referral.

## 2021-06-01 NOTE — Progress Notes (Signed)
Subjective:   Melissa Merritt is a 71 y.o. female who presents for Medicare Annual (Subsequent) preventive examination.  I connected with  [name] on 06/01/21 by a telephone enabled telemedicine application and verified that I am speaking with the correct person using two identifiers.   I discussed the limitations of evaluation and management by telemedicine. The patient expressed understanding and agreed to proceed.  Patient location: home  Provider location: tele-health not in office    Review of Systems     Cardiac Risk Factors include: advanced age (>26men, >51 women);hypertension;obesity (BMI >30kg/m2)     Objective:    Today's Vitals   06/01/21 0819  PainSc: 5    There is no height or weight on file to calculate BMI.  Advanced Directives 06/01/2021 12/21/2020 11/24/2020 11/15/2020 08/15/2020 06/08/2020 06/07/2020  Does Patient Have a Medical Advance Directive? Yes Yes Yes Yes Yes Yes Yes  Type of Paramedic of [place];Living will [place];Living will [place];Living will [place];Living will [place];Living will [place];Living will  Does patient want to make changes to medical advance directive? - No - Patient declined - - Yes (ED - Information included in AVS) - No - Patient declined  Copy of [place] in Chart? No - copy requested No - copy requested - - - No - copy requested No - copy requested  Would patient like information on creating a medical advance directive? - - - - - - -    Current Medications (verified) Outpatient Encounter Medications as of 06/01/2021  Medication Sig   amLODipine (NORVASC) 5 MG tablet Take 1 tablet (5 mg total) by mouth daily.   aspirin 81 MG tablet Take 81 mg by mouth daily.   Biotin 5000 MCG TABS Take 5,000 mcg by mouth 2 (two) times daily.   Calcium  Carb-Cholecalciferol (CALCIUM-VITAMIN D) 600-400 MG-UNIT TABS Take 1 tablet by mouth 2 (two) times daily.   cholecalciferol (VITAMIN D3) 25 MCG (1000 UNIT) tablet Take 1,000 Units by mouth in the morning and at bedtime.   Cyanocobalamin (B-12) 2500 MCG TABS Take 2,500 mcg by mouth daily.   denosumab (PROLIA) 60 MG/ML SOSY injection Inject 60 mg into the skin every 6 (six) months.   exemestane (AROMASIN) 25 MG tablet Take 1 tablet (25 mg total) by mouth daily after breakfast.   ferrous sulfate 220 (44 Fe) MG/5ML solution Take 220 mg by mouth daily.   furosemide (LASIX) 20 MG tablet Take by mouth.   ibuprofen (ADVIL) 200 MG tablet Take 400 mg by mouth every 6 (six) hours as needed for moderate pain.    meloxicam (MOBIC) 15 MG tablet Take 15 mg by mouth daily. Take one tablet once a day   metoprolol succinate (TOPROL-XL) 25 MG 24 hr tablet Take 1 tablet (25 mg total) by mouth daily.   pantoprazole (PROTONIX) 40 MG tablet Take 1 tablet (40 mg total) by mouth 2 (two) times daily.   Red Yeast Rice Extract (RED YEAST RICE PO) Take 1 tablet by mouth in the morning and at bedtime.   St Johns Wort 300 MG CAPS Take 300 mg by mouth 2 (two) times daily.   TRELEGY ELLIPTA 100-62.5-25 MCG/INH AEPB Inhale 1 puff into the lungs daily.   valsartan (DIOVAN) 160 MG tablet Take 1 tablet (160 mg total) by mouth daily.   No facility-administered encounter medications on file as  of 06/01/2021.    Allergies (verified) Contrast media [iodinated contrast media], Other, Ace inhibitors, Hctz [hydrochlorothiazide], and Keflex [cephalexin]   History: Past Medical History:  Diagnosis Date   Benign tumor of lung    Breast cancer ([place])    Cancer ([place])    right breast   CHF (congestive heart failure) (H[facility])    Dyspnea    GERD (gastroesophageal reflux disease)    Hyperlipidemia    Hypertension    Insomnia    Osteopenia    Personal history of radiation therapy    Plantar fasciitis    PONV (postoperative nausea and  vomiting)    vomiting 2 days after lung surgery   S/P pneumonectomy    right lower lobe   Urinary incontinence    Past Surgical History:  Procedure Laterality Date   BREAST BIOPSY Right 07/17/2019   Affirm bx-"Ribbon" clip-path pending   BREAST LUMPECTOMY     bronchoscopy     CATARACT EXTRACTION, BILATERAL     Left- 11/21/15, right- 12/23/15   CHOLECYSTECTOMY     CYSTECTOMY  11/03/09   cheek   dilation and curretage     EYE SURGERY Bilateral    cataract   FOOT SURGERY Bilateral 2010   LOBECTOMY     LUNG SURGERY Right    PART MASTECTOMY,RADIO FREQUENCY LOCALIZER,AXILLARY SENTINEL NODE BIOPSY Right 08/13/2019   Procedure: PART [place] SENTINEL NODE BIOPSY;  Surgeon: Benjamine Sprague, DO;  Location: [facility] ORS;  Service: General;  Laterality: Right;   UPPER GI ENDOSCOPY  2005   Family History  Problem Relation Age of Onset   COPD Mother    Hypertension Mother    Emphysema Mother    Osteoporosis Mother    Heart disease Father    Hyperlipidemia Sister    Hyperlipidemia Brother    Heart disease Maternal Grandmother    Heart disease Maternal Grandfather    Heart disease Paternal Grandmother    Stroke Paternal Grandmother    Heart disease Paternal Grandfather    Stroke Paternal Grandfather    Cancer Maternal Aunt        breast and lung   Osteoporosis Maternal Aunt    Aortic aneurysm Maternal Aunt    Breast cancer Maternal Aunt 76   Social History   Socioeconomic History   Marital status: Married    Spouse name: Not on file   Number of children: Not on file   Years of education: Not on file   Highest education level: Some college, no degree  Occupational History   Occupation: retired   Tobacco Use   Smoking status: Former    Types: Cigarettes    Quit date: 07/09/1979    Years since quitting: 41.9   Smokeless tobacco: Never  Vaping Use   Vaping Use: Never used  Substance and Sexual Activity   Alcohol use: No   Drug use: No   Sexual  activity: Yes  Other Topics Concern   Not on file  Social History Narrative   Not on file   Social Determinants of Health   Financial Resource Strain: Low Risk    Difficulty of Paying Living Expenses: Not hard at all  Food Insecurity: No Food Insecurity   Worried About Charity fundraiser in the Last Year: Never true   Arboriculturist in the Last Year: Never true  Transportation Needs: No Transportation Needs   Lack of Transportation (Medical): No   Lack of Transportation (Non-Medical): No  Physical Activity: Inactive  Days of Exercise per Week: 0 days   Minutes of Exercise per Session: 0 min  Stress: No Stress Concern Present   Feeling of Stress : Not at all  Social Connections: Moderately Integrated   Frequency of Communication with Friends and Family: More than three times a week   Frequency of Social Gatherings with Friends and Family: More than three times a week   Attends Religious Services: More than 4 times per year   Active Member of [employer] or Organizations: No   Attends Music therapist: Not on file   Marital Status: Married    Tobacco Counseling Counseling given: Not Answered   Clinical Intake:  Pre-visit preparation completed: Yes  Pain : 0-10 Pain Score: 5  Pain Location: Knee Pain Orientation: Left Pain Descriptors / Indicators: Aching Pain Relieving Factors: meloxicam,tylenol  Pain Relieving Factors: meloxicam,tylenol  Nutritional Risks: None Diabetes: No  How often do you need to have someone help you when you read instructions, pamphlets, or other written materials from your doctor or pharmacy?: 1 - Never  Diabetic?  no  Interpreter Needed?: No  Information entered by :: Leroy Kennedy LPN   Activities of Daily Living In your present state of health, do you have any difficulty performing the following activities: 06/01/2021  Hearing? N  Vision? N  Difficulty concentrating or making decisions? N  Walking or climbing stairs? N   Dressing or bathing? N  Doing errands, shopping? N  Using the Toilet? N  In the past six months, have you accidently leaked urine? Y  Managing your Medications? N  Managing your Finances? N  Housekeeping or managing your Housekeeping? N  Some recent data might be hidden    Patient Care Team: Venita Lick, NP as PCP - General (Nurse Practitioner) Idelle Leech, OD (Optometry) Isaias Cowman, MD as Consulting Physician (Cardiology) Manya Silvas, MD (Inactive) (Gastroenterology) Lucky Cowboy Erskine Squibb, MD as Referring Physician (Vascular Surgery) Clabe Seal, Benny Lennert, MD (Dermatology) Theodore Demark, RN as Oncology Nurse Navigator Benjamine Sprague, DO as Consulting Physician (Surgery) Noreene Filbert, MD as Radiation Oncologist (Radiation Oncology) Cammie Sickle, MD as Consulting Physician (Hematology and Oncology)  Indicate any recent Medical Services you may have received from other than [facility] providers in the past year (date may be approximate).     Assessment:   This is a routine wellness examination for [place].  Hearing/Vision screen Hearing Screening - Comments:: No trouble hearing Vision Screening - Comments:: Up to date Dr. Matilde Sprang  Dietary issues and exercise activities discussed: Current Exercise Habits: The patient does not participate in regular exercise at present   Goals Addressed             This Visit's Progress    DIET - [place]   Not on track    Recommend drinking at least 6-8 glasses of water a day      Patient Stated       Drink more water       Depression Screen PHQ 2/9 Scores 06/01/2021 05/25/2021 11/24/2020 05/30/2020 05/25/2020 11/23/2019 05/25/2019  PHQ - 2 Score 0 0 0 0 0 0 0  PHQ- 9 Score 3 3 2  - - - -    Fall Risk Fall Risk  06/01/2021 11/24/2020 05/30/2020 05/25/2020 11/23/2019  Falls in the past year? 1 1 0 0 0  Number falls in past yr: 1 0 0 - 0  Injury with Fall? 0 0 - - 0  Comment -  tripped over  root  scraped knees - - -  Risk for fall due to : - No Fall Risks Medication side effect - No Fall Risks  Follow up Falls evaluation completed;Falls prevention discussed Falls prevention discussed;Education provided Falls evaluation completed;Education provided;Falls prevention discussed - Falls evaluation completed    FALL RISK PREVENTION PERTAINING TO THE HOME:  Any stairs in or around the home? Yes  If so, are there any without handrails? No  Home free of loose throw rugs in walkways, pet beds, electrical cords, etc? Yes  Adequate lighting in your home to reduce risk of falls? Yes   ASSISTIVE DEVICES UTILIZED TO PREVENT FALLS:  Life alert? No  Use of a cane, walker or w/c? No  Grab bars in the bathroom? Yes  Shower chair or bench in shower? Yes  Elevated toilet seat or a handicapped toilet? Yes   TIMED UP AND GO:  Was the test performed? No .    Cognitive Function:  Normal cognitive status assessed by direct observation by this Nurse Health Advisor. No abnormalities found.       6CIT Screen 05/30/2020 05/25/2019 05/19/2018 05/17/2017  What Year? 0 points 0 points 0 points 0 points  What month? 0 points 0 points 0 points 0 points  What time? 0 points 0 points 0 points 0 points  Count back from 20 0 points 0 points 0 points 0 points  Months in reverse 0 points 0 points 0 points 0 points  Repeat phrase 0 points 0 points 0 points 0 points  Total Score 0 0 0 0    Immunizations Immunization History  Administered Date(s) Administered   Fluad Quad(high Dose 65+) 02/24/2019   Influenza, High Dose Seasonal PF 12/27/2015, 01/15/2018   Influenza,inj,Quad PF,6+ Mos 01/21/2015   Influenza-Unspecified 02/08/2017, 02/09/2021   Moderna Sars-Covid-2 Vaccination 06/10/2019, 07/07/2019, 04/28/2020   Pneumococcal Conjugate-13 05/19/2014   Pneumococcal Polysaccharide-23 01/14/2008, 05/23/2015   Td 09/11/2005   Tdap 12/27/2015   Zoster, Live 09/04/2011    TDAP status: Up to date  Flu  Vaccine status: Up to date  Pneumococcal vaccine status: Up to date  Covid-19 vaccine status: Information provided on how to obtain vaccines.   Qualifies for Shingles Vaccine? Yes   Zostavax completed Yes   Shingrix Completed?: No.    Education has been provided regarding the importance of this vaccine. Patient has been advised to call insurance company to determine out of pocket expense if they have not yet received this vaccine. Advised may also receive vaccine at local pharmacy or Health Dept. Verbalized acceptance and understanding.  Screening Tests Health Maintenance  Topic Date Due   COVID-19 Vaccine (4 - Booster for Moderna series) 06/10/2021 (Originally 06/23/2020)   Zoster Vaccines- Shingrix (1 of 2) 08/22/2021 (Originally 12/01/1969)   COLONOSCOPY (Pts 45-55yrs Insurance coverage will need to be confirmed)  11/22/2021 (Originally 10/20/2018)   MAMMOGRAM  07/09/2022   TETANUS/TDAP  12/26/2025   Pneumonia Vaccine 86+ Years old  Completed   INFLUENZA VACCINE  Completed   DEXA SCAN  Completed   Hepatitis C Screening  Completed   HPV VACCINES  Aged Out    Health Maintenance  There are no preventive care reminders to display for this patient.  Colorectal cancer screening: Type of screening: Colonoscopy. Completed 2015. Repeat every patient is due ordered years  Mammogram status: Ordered  . Pt provided with contact info and advised to call to schedule appt.   Bone Density status: Ordered  . Pt provided with contact  info and advised to call to schedule appt.  Lung Cancer Screening: (Low Dose CT Chest recommended if Age 28-80 years, 30 pack-year currently smoking OR have quit w/in 15years.) does not qualify.  Lung Cancer Screening Referral:   Additional Screening:  Hepatitis C Screening: does not qualify; Completed 2017  Vision Screening: Recommended annual ophthalmology exams for early detection of glaucoma and other disorders of the eye. Is the patient up to date with  their annual eye exam?  Yes  Who is the provider or what is the name of the office in which the patient attends annual eye exams? Dr.nice If pt is not established with a provider, would they like to be referred to a provider to establish care? No .   Dental Screening: Recommended annual dental exams for proper oral hygiene  Community Resource Referral / Chronic Care Management: CRR required this visit?  No   CCM required this visit?  No      Plan:     I have personally reviewed and noted the following in the patients chart:   Medical and social history Use of alcohol, tobacco or illicit drugs  Current medications and supplements including opioid prescriptions.  Functional ability and status Nutritional status Physical activity Advanced directives List of other physicians Hospitalizations, surgeries, and ER visits in previous 12 months Vitals Screenings to include cognitive, depression, and falls Referrals and appointments  In addition, I have reviewed and discussed with patient certain preventive protocols, quality metrics, and best practice recommendations. A written personalized care plan for preventive services as well as general preventive health recommendations were provided to patient.     Leroy Kennedy, LPN   3/36/1224   Nurse Notes:

## 2021-06-02 ENCOUNTER — Ambulatory Visit: Payer: Medicare Other

## 2021-06-19 ENCOUNTER — Ambulatory Visit
Admission: RE | Admit: 2021-06-19 | Discharge: 2021-06-19 | Disposition: A | Discharge status: Home / Self Care | Payer: Medicare Other | Source: Ambulatory Visit | Attending: Nurse Practitioner | Admitting: Nurse Practitioner

## 2021-06-19 ENCOUNTER — Ambulatory Visit
Admission: RE | Admit: 2021-06-19 | Discharge: 2021-06-19 | Disposition: A | Discharge status: Home / Self Care | Payer: Medicare Other | Attending: Nurse Practitioner | Admitting: Nurse Practitioner

## 2021-06-19 ENCOUNTER — Other Ambulatory Visit: Payer: Self-pay

## 2021-06-19 DIAGNOSIS — D1430 Benign neoplasm of unspecified bronchus and lung: Secondary | ICD-10-CM | POA: Diagnosis not present

## 2021-06-19 DIAGNOSIS — I700 Atherosclerosis of aorta: Secondary | ICD-10-CM | POA: Diagnosis not present

## 2021-06-19 DIAGNOSIS — I517 Cardiomegaly: Secondary | ICD-10-CM | POA: Diagnosis not present

## 2021-06-20 NOTE — Progress Notes (Signed)
Contacted via [place] ? ? ?Chest imaging remains normal, no concerns on this:)

## 2021-07-04 DIAGNOSIS — Z20822 Contact with and (suspected) exposure to covid-19: Secondary | ICD-10-CM | POA: Diagnosis not present

## 2021-07-12 ENCOUNTER — Ambulatory Visit
Admission: RE | Admit: 2021-07-12 | Discharge: 2021-07-12 | Disposition: A | Discharge status: Home / Self Care | Payer: Medicare Other | Source: Ambulatory Visit | Attending: Internal Medicine | Admitting: Internal Medicine

## 2021-07-12 DIAGNOSIS — Z170 Estrogen receptor positive status [ER+]: Secondary | ICD-10-CM | POA: Diagnosis not present

## 2021-07-12 DIAGNOSIS — R922 Inconclusive mammogram: Secondary | ICD-10-CM | POA: Diagnosis not present

## 2021-07-12 DIAGNOSIS — C50811 Malignant neoplasm of overlapping sites of right female breast: Secondary | ICD-10-CM | POA: Diagnosis not present

## 2021-07-13 ENCOUNTER — Other Ambulatory Visit: Payer: Self-pay | Admitting: Internal Medicine

## 2021-07-13 DIAGNOSIS — R928 Other abnormal and inconclusive findings on diagnostic imaging of breast: Secondary | ICD-10-CM

## 2021-07-13 DIAGNOSIS — R921 Mammographic calcification found on diagnostic imaging of breast: Secondary | ICD-10-CM

## 2021-07-18 DIAGNOSIS — Z853 Personal history of malignant neoplasm of breast: Secondary | ICD-10-CM | POA: Diagnosis not present

## 2021-07-18 DIAGNOSIS — Z86010 Personal history of colonic polyps: Secondary | ICD-10-CM | POA: Diagnosis not present

## 2021-07-18 DIAGNOSIS — Z1211 Encounter for screening for malignant neoplasm of colon: Secondary | ICD-10-CM | POA: Diagnosis not present

## 2021-07-18 DIAGNOSIS — K439 Ventral hernia without obstruction or gangrene: Secondary | ICD-10-CM | POA: Diagnosis not present

## 2021-07-27 DIAGNOSIS — Z20822 Contact with and (suspected) exposure to covid-19: Secondary | ICD-10-CM | POA: Diagnosis not present

## 2021-07-31 ENCOUNTER — Ambulatory Visit
Admission: RE | Admit: 2021-07-31 | Discharge: 2021-07-31 | Disposition: A | Discharge status: Home / Self Care | Payer: Medicare Other | Source: Ambulatory Visit | Attending: Internal Medicine | Admitting: Internal Medicine

## 2021-07-31 DIAGNOSIS — R921 Mammographic calcification found on diagnostic imaging of breast: Secondary | ICD-10-CM | POA: Insufficient documentation

## 2021-07-31 DIAGNOSIS — R928 Other abnormal and inconclusive findings on diagnostic imaging of breast: Secondary | ICD-10-CM | POA: Insufficient documentation

## 2021-07-31 DIAGNOSIS — N6081 Other benign mammary dysplasias of right breast: Secondary | ICD-10-CM | POA: Diagnosis not present

## 2021-07-31 HISTORY — PX: BREAST BIOPSY: SHX20

## 2021-08-01 LAB — SURGICAL PATHOLOGY

## 2021-08-07 DIAGNOSIS — Z20822 Contact with and (suspected) exposure to covid-19: Secondary | ICD-10-CM | POA: Diagnosis not present

## 2021-08-15 ENCOUNTER — Encounter: Payer: Self-pay | Admitting: Internal Medicine

## 2021-08-15 ENCOUNTER — Inpatient Hospital Stay: Payer: Medicare Other | Attending: Internal Medicine

## 2021-08-15 ENCOUNTER — Inpatient Hospital Stay: Payer: Medicare Other

## 2021-08-15 ENCOUNTER — Inpatient Hospital Stay (HOSPITAL_BASED_OUTPATIENT_CLINIC_OR_DEPARTMENT_OTHER): Payer: Medicare Other | Admitting: Internal Medicine

## 2021-08-15 DIAGNOSIS — G4700 Insomnia, unspecified: Secondary | ICD-10-CM | POA: Diagnosis not present

## 2021-08-15 DIAGNOSIS — D509 Iron deficiency anemia, unspecified: Secondary | ICD-10-CM | POA: Diagnosis not present

## 2021-08-15 DIAGNOSIS — Z923 Personal history of irradiation: Secondary | ICD-10-CM | POA: Diagnosis not present

## 2021-08-15 DIAGNOSIS — M8580 Other specified disorders of bone density and structure, unspecified site: Secondary | ICD-10-CM | POA: Diagnosis not present

## 2021-08-15 DIAGNOSIS — M8588 Other specified disorders of bone density and structure, other site: Secondary | ICD-10-CM

## 2021-08-15 DIAGNOSIS — I110 Hypertensive heart disease with heart failure: Secondary | ICD-10-CM | POA: Insufficient documentation

## 2021-08-15 DIAGNOSIS — Z853 Personal history of malignant neoplasm of breast: Secondary | ICD-10-CM

## 2021-08-15 DIAGNOSIS — Z801 Family history of malignant neoplasm of trachea, bronchus and lung: Secondary | ICD-10-CM | POA: Diagnosis not present

## 2021-08-15 DIAGNOSIS — C50911 Malignant neoplasm of unspecified site of right female breast: Secondary | ICD-10-CM | POA: Diagnosis not present

## 2021-08-15 DIAGNOSIS — Z803 Family history of malignant neoplasm of breast: Secondary | ICD-10-CM | POA: Insufficient documentation

## 2021-08-15 DIAGNOSIS — I509 Heart failure, unspecified: Secondary | ICD-10-CM | POA: Insufficient documentation

## 2021-08-15 DIAGNOSIS — Z170 Estrogen receptor positive status [ER+]: Secondary | ICD-10-CM | POA: Diagnosis not present

## 2021-08-15 DIAGNOSIS — Z87891 Personal history of nicotine dependence: Secondary | ICD-10-CM | POA: Insufficient documentation

## 2021-08-15 DIAGNOSIS — C50811 Malignant neoplasm of overlapping sites of right female breast: Secondary | ICD-10-CM | POA: Diagnosis not present

## 2021-08-15 DIAGNOSIS — E538 Deficiency of other specified B group vitamins: Secondary | ICD-10-CM | POA: Insufficient documentation

## 2021-08-15 DIAGNOSIS — Z79811 Long term (current) use of aromatase inhibitors: Secondary | ICD-10-CM | POA: Diagnosis not present

## 2021-08-15 DIAGNOSIS — D649 Anemia, unspecified: Secondary | ICD-10-CM

## 2021-08-15 LAB — CBC WITH DIFFERENTIAL/PLATELET
Abs Immature Granulocytes: 0.1 10*3/uL — ABNORMAL HIGH (ref 0.00–0.07)
Basophils Absolute: 0.1 10*3/uL (ref 0.0–0.1)
Basophils Relative: 1 %
Eosinophils Absolute: 0.3 10*3/uL (ref 0.0–0.5)
Eosinophils Relative: 4 %
HCT: 39.1 % (ref 36.0–46.0)
Hemoglobin: 13 g/dL (ref 12.0–15.0)
Immature Granulocytes: 1 %
Lymphocytes Relative: 14 %
Lymphs Abs: 1.1 10*3/uL (ref 0.7–4.0)
MCH: 30 pg (ref 26.0–34.0)
MCHC: 33.2 g/dL (ref 30.0–36.0)
MCV: 90.3 fL (ref 80.0–100.0)
Monocytes Absolute: 0.8 10*3/uL (ref 0.1–1.0)
Monocytes Relative: 11 %
Neutro Abs: 5.3 10*3/uL (ref 1.7–7.7)
Neutrophils Relative %: 69 %
Platelets: 218 10*3/uL (ref 150–400)
RBC: 4.33 MIL/uL (ref 3.87–5.11)
RDW: 13.7 % (ref 11.5–15.5)
WBC: 7.7 10*3/uL (ref 4.0–10.5)
nRBC: 0 % (ref 0.0–0.2)

## 2021-08-15 LAB — COMPREHENSIVE METABOLIC PANEL
ALT: 18 U/L (ref 0–44)
AST: 18 U/L (ref 15–41)
Albumin: 4.1 g/dL (ref 3.5–5.0)
Alkaline Phosphatase: 65 U/L (ref 38–126)
Anion gap: 7 (ref 5–15)
BUN: 19 mg/dL (ref 8–23)
CO2: 27 mmol/L (ref 22–32)
Calcium: 9.6 mg/dL (ref 8.9–10.3)
Chloride: 103 mmol/L (ref 98–111)
Creatinine, Ser: 0.73 mg/dL (ref 0.44–1.00)
GFR, Estimated: >60 mL/min (ref >60)
Glucose, Bld: 119 mg/dL — ABNORMAL HIGH (ref 70–99)
Potassium: 3.8 mmol/L (ref 3.5–5.1)
Sodium: 137 mmol/L (ref 135–145)
Total Bilirubin: 0.5 mg/dL (ref 0.3–1.2)
Total Protein: 7.3 g/dL (ref 6.5–8.1)

## 2021-08-15 LAB — VITAMIN B12: Vitamin B-12: 1851 pg/mL — ABNORMAL HIGH (ref 180–914)

## 2021-08-15 MED ORDER — DENOSUMAB 60 MG/ML ~~LOC~~ SOSY
60.0000 mg | PREFILLED_SYRINGE | Freq: Once | SUBCUTANEOUS | Status: AC
  Administered 2021-08-15: 60 mg via SUBCUTANEOUS
  Filled 2021-08-15: qty 1

## 2021-08-15 NOTE — Assessment & Plan Note (Addendum)
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  Currently on Aromasin.APRIl 2023-abnormal mammogram recent breast biopsy-negative for any malignancy.  ? ?# Patient currently on Aromasin-tolerating well.  No worsening joint pains.  No worsening hot flashes.  Patient will need bilateral screening mammogram in April 2024. ? ?#  Osteopenia-bone density scan in April 2021 consistent with osteopenia.   Continue calcium 1200 mg and vitamin D 1000 IU daily along with weightbearing exercise as tolerated.  Plan to repeat bone density in April 2024; will order at next visit.  Continue. prolia today.  ?? ?#   Iron deficiency anemia-history of iron deficiency anemia.  On oral iron.  Not anemic on most recent blood work.  Iron studies pending today.  Continue oral iron in the interim. ?? ?# B12 deficiency.  History of B12 deficiency; FEB 2023- 1800. recommend on oral B12 3 times a week- STABLE. ? ?# # Insomnia- recommend melatonin qhs prn.  ?             ?# DISPOSITION: ?# Prolia today-  ?# follow up- in 6 months- MD; cbc/cmp; ca27-29; b12; prolia- Dr.B  ?? ?

## 2021-08-15 NOTE — Progress Notes (Signed)
C/o not being able to sleep. ?

## 2021-08-15 NOTE — Addendum Note (Signed)
Addended by: Leeann Must on: 08/15/2021 11:21 AM ? ? Modules accepted: Orders ? ?

## 2021-08-15 NOTE — Progress Notes (Signed)
[facility] ?CONSULT NOTE ? ?Patient Care Team: ?Venita Lick, NP as PCP - General (Nurse Practitioner) ?Nice, Reed Breech, OD (Optometry) ?Isaias Cowman, MD as Consulting Physician (Cardiology) ?Manya Silvas, MD (Inactive) (Gastroenterology) ?Algernon Huxley, MD as Referring Physician (Vascular Surgery) ?Clabe Seal, PA-C ?Ree Edman, MD (Dermatology) ?Theodore Demark, RN as Oncology Nurse Navigator ?Benjamine Sprague, DO as Consulting Physician (Surgery) ?Noreene Filbert, MD as Radiation Oncologist (Radiation Oncology) ?Cammie Sickle, MD as Consulting Physician (Hematology and Oncology) ? ?CHIEF COMPLAINTS/PURPOSE OF CONSULTATION: right breast cancer ? ? ?#  ?Oncology History Overview Note  ?Bilateral diagnostic mammogram on 07/08/2019 revealed persistent architectural distortion involving the upper outer quadrant of the right breast without sonographic correlate.  There was no pathologic right axillary lymphadenopathy.  No mammographic or sonographic evidence of malignancy in the left breast. ? ?CA27.29 was 19.0 (07/24/19) at diagnosis.  ? ?Patient underwent stereotactic biopsy on 07/17/2019.  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  Intermediate nuclear grade with central necrosis. ? ?Consistent with stage Ia right breast cancer.  She underwent partial mastectomy and right axillary sentinel lymph node biopsy on 08/13/2019.  Pathology revealed grade 1 invasive mammary carcinoma, no special type, and intermediate grade DCIS.  There is atypical lobular hyperplasia with ductal involvement.  Additional posterior margin revealed no evidence of residual carcinoma or DCIS.  Closest margin was > 10 mm and 2 mm for DCIS.  1 lymph node was negative for malignancy.  Tumor was ER/PR positive and HER-2/neu negative.  Pathologic stage was pT1a pN0 (sn).  ? ?She received 5040 cGy to her right breast and 1400 cGy boost to her scar from 09/28/2019-11/17/2019.  She began  tamoxifen on 11/27/2019.  Tamoxifen discontinued in December 2021 due to intolerable side effects.  She was switched to Femara, started 04/12/20.  stopped in 9th May 2022 sec to leg swelling. ? ?# AUG 2022- STARTED AROMASIN ? ?Baseline bone density scan in April 2021 consistent with osteopenia with T score of -1.5. Plan for prolia.  ?  ?History of breast cancer  ?07/24/2019 Initial Diagnosis  ? Malignant neoplasm of right breast in female, estrogen receptor positive ([place]) ?  ?Carcinoma of overlapping sites of right breast in female, estrogen receptor positive ([place])  ?02/14/2021 Initial Diagnosis  ? Carcinoma of overlapping sites of right breast in female, estrogen receptor positive ([place]) ?  ? ? ? ?HISTORY OF PRESENTING ILLNESS:  ?Melissa Merritt 71 y.o.  female with stage I ER/PR positive HER2 negative breast cancer is here for follow-up/review results of her breast biopsy. ? ?In the interim patient has been evaluated by radiology underwent biopsy of the abnormal calcifications noted on the mammogram.  ? ?Patient denies any worsening hot flashes or swelling in the legs.  Appetite is good.  No weight loss. ? ?C/o not being able to sleep; sleeps with CPAP.  Concerned about anxiety. ? ?Review of Systems  ?Constitutional:  Positive for malaise/fatigue. Negative for chills, diaphoresis and fever.  ?HENT:  Negative for nosebleeds and sore throat.   ?Eyes:  Negative for double vision.  ?Respiratory:  Negative for cough, hemoptysis, sputum production, shortness of breath and wheezing.   ?Cardiovascular:  Negative for chest pain, palpitations, orthopnea and leg swelling.  ?Gastrointestinal:  Negative for abdominal pain, blood in stool, constipation, diarrhea, heartburn, melena, nausea and vomiting.  ?Genitourinary:  Negative for dysuria, frequency and urgency.  ?Musculoskeletal:  Positive for back pain and joint pain.  ?Skin: Negative.  Negative for itching and rash.  ?Neurological:  Negative for dizziness, tingling, focal  weakness, weakness and headaches.  ?Endo/Heme/Allergies:  Does not bruise/bleed easily.  ?Psychiatric/Behavioral:  Negative for depression. The patient is not nervous/anxious and does not have insomnia.    ? ?MEDICAL HISTORY:  ?Past Medical History:  ?Diagnosis Date  ? Benign tumor of lung   ? Breast cancer ([place])   ? Cancer [facility])   ? right breast  ? CHF (congestive heart failure) ([place])   ? Dyspnea   ? GERD (gastroesophageal reflux disease)   ? Hyperlipidemia   ? Hypertension   ? Insomnia   ? Osteopenia   ? Personal history of radiation therapy   ? Plantar fasciitis   ? PONV (postoperative nausea and vomiting)   ? vomiting 2 days after lung surgery  ? S/P pneumonectomy   ? right lower lobe  ? Urinary incontinence   ? ? ?SURGICAL HISTORY: ?Past Surgical History:  ?Procedure Laterality Date  ? BREAST BIOPSY Right 07/17/2019  ? Affirm bx-"Ribbon" clip-[facility] & DCIS  ? BREAST BIOPSY Right 07/31/2021  ? Atereo bx, x-clip, path pending  ? BREAST LUMPECTOMY Right 2021  ? [facility], DCIS, 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  ? bronchoscopy    ? CATARACT EXTRACTION, BILATERAL    ? Left- 11/21/15, right- 12/23/15  ? CHOLECYSTECTOMY    ? CYSTECTOMY  11/03/2009  ? cheek  ? dilation and curretage    ? EYE SURGERY Bilateral   ? cataract  ? FOOT SURGERY Bilateral 2010  ? LOBECTOMY    ? LUNG SURGERY Right   ? PART MASTECTOMY,RADIO FREQUENCY LOCALIZER,AXILLARY SENTINEL NODE BIOPSY Right 08/13/2019  ? Procedure: PART [place] NODE BIOPSY;  Surgeon: Benjamine Sprague, DO;  Location: [facility] ORS;  Service: General;  Laterality: Right;  ? UPPER GI ENDOSCOPY  2005  ? ? ?SOCIAL HISTORY: ?Social History  ? ?Socioeconomic History  ? Marital status: Married  ?  Spouse name: Not on file  ? Number of children: Not on file  ? Years of education: Not on file  ? Highest education level: Some college, no degree  ?Occupational History  ? Occupation: retired   ?Tobacco Use  ? Smoking status: Former  ?  Types: Cigarettes  ?  Quit  date: 07/09/1979  ?  Years since quitting: 42.1  ? Smokeless tobacco: Never  ?Vaping Use  ? Vaping Use: Never used  ?Substance and Sexual Activity  ? Alcohol use: No  ? Drug use: No  ? Sexual activity: Yes  ?Other Topics Concern  ? Not on file  ?Social History Narrative  ? Not on file  ? ?Social Determinants of Health  ? ?Financial Resource Strain: Low Risk   ? Difficulty of Paying Living Expenses: Not hard at all  ?Food Insecurity: No Food Insecurity  ? Worried About Charity fundraiser in the Last Year: Never true  ? Ran Out of Food in the Last Year: Never true  ?Transportation Needs: No Transportation Needs  ? Lack of Transportation (Medical): No  ? Lack of Transportation (Non-Medical): No  ?Physical Activity: Inactive  ? Days of Exercise per Week: 0 days  ? Minutes of Exercise per Session: 0 min  ?Stress: No Stress Concern Present  ? Feeling of Stress : Not at all  ?Social Connections: Moderately Integrated  ? Frequency of Communication with Friends and Family: More than three times a week  ? Frequency of Social Gatherings with Friends and Family: More than three times a week  ?  Attends Religious Services: More than 4 times per year  ? Active Member of Clubs or Organizations: No  ? Attends Archivist Meetings: Not on file  ? Marital Status: Married  ?Intimate Partner Violence: Not At Risk  ? Fear of Current or Ex-Partner: No  ? Emotionally Abused: No  ? Physically Abused: No  ? Sexually Abused: No  ? ? ?FAMILY HISTORY: ?Family History  ?Problem Relation Age of Onset  ? COPD Mother   ? Hypertension Mother   ? Emphysema Mother   ? Osteoporosis Mother   ? Heart disease Father   ? Hyperlipidemia Sister   ? Hyperlipidemia Brother   ? Heart disease Maternal Grandmother   ? Heart disease Maternal Grandfather   ? Heart disease Paternal Grandmother   ? Stroke Paternal Grandmother   ? Heart disease Paternal Grandfather   ? Stroke Paternal Grandfather   ? Cancer Maternal Aunt   ?     breast and lung  ? Osteoporosis  Maternal Aunt   ? Aortic aneurysm Maternal Aunt   ? Breast cancer Maternal Aunt 76  ? ? ?ALLERGIES:  is allergic to contrast media [iodinated contrast media], other, ace inhibitors, hctz [hydrochlorothiazide], a

## 2021-08-16 LAB — CANCER ANTIGEN 27.29: CA 27.29: 15 U/mL (ref 0.0–38.6)

## 2021-10-02 DIAGNOSIS — R0602 Shortness of breath: Secondary | ICD-10-CM | POA: Diagnosis not present

## 2021-11-06 ENCOUNTER — Other Ambulatory Visit: Payer: Self-pay | Admitting: Internal Medicine

## 2021-11-18 NOTE — Patient Instructions (Signed)

## 2021-11-22 ENCOUNTER — Encounter: Payer: Self-pay | Admitting: Nurse Practitioner

## 2021-11-22 ENCOUNTER — Ambulatory Visit (INDEPENDENT_AMBULATORY_CARE_PROVIDER_SITE_OTHER): Payer: Medicare Other | Admitting: Nurse Practitioner

## 2021-11-22 VITALS — BP 138/74 | HR 72 | Temp 98.0°F | Wt 231.1 lb

## 2021-11-22 DIAGNOSIS — Z6835 Body mass index (BMI) 35.0-35.9, adult: Secondary | ICD-10-CM | POA: Diagnosis not present

## 2021-11-22 DIAGNOSIS — R7301 Impaired fasting glucose: Secondary | ICD-10-CM | POA: Diagnosis not present

## 2021-11-22 DIAGNOSIS — G4733 Obstructive sleep apnea (adult) (pediatric): Secondary | ICD-10-CM | POA: Diagnosis not present

## 2021-11-22 DIAGNOSIS — M8588 Other specified disorders of bone density and structure, other site: Secondary | ICD-10-CM

## 2021-11-22 DIAGNOSIS — I10 Essential (primary) hypertension: Secondary | ICD-10-CM | POA: Diagnosis not present

## 2021-11-22 DIAGNOSIS — J449 Chronic obstructive pulmonary disease, unspecified: Secondary | ICD-10-CM

## 2021-11-22 DIAGNOSIS — Z853 Personal history of malignant neoplasm of breast: Secondary | ICD-10-CM | POA: Diagnosis not present

## 2021-11-22 DIAGNOSIS — E782 Mixed hyperlipidemia: Secondary | ICD-10-CM | POA: Diagnosis not present

## 2021-11-22 NOTE — Assessment & Plan Note (Signed)
BMI 35.93.  Recommended eating smaller high protein, low fat meals more frequently and exercising 30 mins a day 5 times a week with a goal of 10-15lb weight loss in the next 3 months. Patient voiced their understanding and motivation to adhere to these recommendations.

## 2021-11-22 NOTE — Assessment & Plan Note (Signed)
A1C today for recheck == A1c remains 5.8%.  Continue diet focus at home.

## 2021-11-22 NOTE — Assessment & Plan Note (Addendum)
Recommend she use her CPAP 100% of the time, currently about 80-90%.

## 2021-11-22 NOTE — Progress Notes (Signed)
BP 138/74 (BP Location: Left Arm, Patient Position: Sitting, Cuff Size: Large)   Pulse 72   Temp 98 F (36.7 C) (Oral)   Wt 231 lb 1.6 oz (104.8 kg)   SpO2 96%   BMI 35.93 kg/m    Subjective:    Patient ID: Melissa Merritt, female    DOB: 14-Mar-1951, 71 y.o.   MRN: [id number]  HPI: Melissa Merritt is a 71 y.o. female  Chief Complaint  Patient presents with   Hypertension   Hyperlipidemia   Sleep Apnea    6 month follow up    HYPERTENSION / HYPERLIPIDEMIA Taking Valsartan, Metoprolol, Amlodipine, Lasix. Sees Dr. Josefa Half with cardiology, last saw in his office 06/01/21.  Last echo 08/24/20 with LV function LVEF 45-50% with mild reduced left ventricular function which is consistent with previous echos, going back to 2013.  Refuses statin therapy, even after at length discussion about risk reduction.  Is taking red yeast rice.   Satisfied with current treatment? yes Duration of hypertension: chronic BP monitoring frequency: a few times a week BP range: yesterday was 161/102, HR 71 == often 130/70 range on average BP medication side effects: no Duration of hyperlipidemia: chronic Cholesterol medication side effects: no Cholesterol supplements: red yeast rice Medication compliance: good compliance Aspirin: yes Recent stressors: no Recurrent headaches: no Visual changes: no Palpitations: no Dyspnea: at baseline, no worsening Chest pain: no Lower extremity edema: no Dizzy/lightheaded: no  The 10-year ASCVD risk score (Arnett DK, et al., 2019) is: 18.2%   Values used to calculate the score:     Age: 67 years     Sex: Female     Is Non-Hispanic African American: No     Diabetic: No     Tobacco smoker: No     Systolic Blood Pressure: 409 mmHg     Is BP treated: Yes     HDL Cholesterol: 51 mg/dL     Total Cholesterol: 213 mg/dL  IFG Noted on labs, last A1c in February was 5.8% Polydipsia/polyuria: no Visual disturbance: no Chest pain: no Paresthesias: no  BREAST  CANCER: Followed by oncology and received radiation treatment. Finished treatments 11/16/2019, continues to be followed by oncology every 6 months, last visit 08/15/21. Taking Aromasin, currently doing well with this.  History of osteopenia on DEXA followed with oncology -- they have placed her on Prolia.   SLEEP APNEA/ASTHMA Pulmonary last on 10/02/21 for asthma -- taking Trelegy.  Stage 2 COPD.  Finished pulmonary rehab in December 2022.  Currently has CPAP and uses about 80-90% of the time. Sleep apnea status: stable Duration: chronic Satisfied with current treatment?:  yes CPAP use:  no Last sleep study: February and March 2021 Treatments attempted: none Wakes feeling refreshed:  no Daytime hypersomnolence:  yes Fatigue:  yes Insomnia:  yes Good sleep hygiene:  yes Difficulty falling asleep:  yes Difficulty staying asleep:  yes Snoring bothers bed partner:  yes Observed apnea by bed partner: yes Obesity:  yes Hypertension: yes  Pulmonary hypertension:  no Coronary artery disease:  no     11/22/2021    8:21 AM 06/01/2021   10:09 AM 05/25/2021   10:02 AM 11/24/2020    2:53 PM 05/30/2020    9:02 AM  Depression screen PHQ 2/9  Decreased Interest 0 0 0 0 0  Down, Depressed, Hopeless 0 0 0 0 0  PHQ - 2 Score 0 0 0 0 0  Altered sleeping '3 2 2 1   '$ Tired, decreased  energy '1 1 1 1   '$ Change in appetite 1 0 0 0   Feeling bad or failure about yourself  0 0 0 0   Trouble concentrating 0 0 0 0   Moving slowly or fidgety/restless 0 0 0 0   Suicidal thoughts 0 0 0 0   PHQ-9 Score '5 3 3 2   '$ Difficult doing work/chores Somewhat difficult Not difficult at all Not difficult at all Not difficult at all     Relevant past medical, surgical, family and social history reviewed and updated as indicated. Interim medical history since our last visit reviewed. Allergies and medications reviewed and updated.  Review of Systems  Constitutional:  Negative for activity change, appetite change,  diaphoresis, fatigue and fever.  Respiratory:  Negative for cough, chest tightness and shortness of breath.   Cardiovascular:  Negative for chest pain, palpitations and leg swelling.  Gastrointestinal: Negative.   Endocrine: Negative.   Psychiatric/Behavioral: Negative.      Per HPI unless specifically indicated above     Objective:    BP 138/74 (BP Location: Left Arm, Patient Position: Sitting, Cuff Size: Large)   Pulse 72   Temp 98 F (36.7 C) (Oral)   Wt 231 lb 1.6 oz (104.8 kg)   SpO2 96%   BMI 35.93 kg/m   Wt Readings from Last 3 Encounters:  11/22/21 231 lb 1.6 oz (104.8 kg)  08/15/21 235 lb 6.4 oz (106.8 kg)  05/25/21 227 lb 3.2 oz (103.1 kg)    Physical Exam Vitals and nursing note reviewed.  Constitutional:      General: She is awake. She is not in acute distress.    Appearance: She is well-developed. She is obese. She is not ill-appearing.  HENT:     Head: Normocephalic.     Right Ear: Hearing normal.     Left Ear: Hearing normal.  Eyes:     General: Lids are normal.        Right eye: No discharge.        Left eye: No discharge.     Conjunctiva/sclera: Conjunctivae normal.     Pupils: Pupils are equal, round, and reactive to light.  Neck:     Thyroid: No thyromegaly.     Vascular: No carotid bruit.  Cardiovascular:     Rate and Rhythm: Normal rate and regular rhythm.     Heart sounds: Normal heart sounds. No murmur heard.    No gallop.  Pulmonary:     Effort: Pulmonary effort is normal. No accessory muscle usage or respiratory distress.     Breath sounds: Normal breath sounds.  Abdominal:     General: Bowel sounds are normal. There are no signs of injury.     Palpations: Abdomen is soft.     Tenderness: There is no abdominal tenderness.  Musculoskeletal:     Cervical back: Normal range of motion and neck supple.     Right lower leg: No edema.     Left lower leg: No edema.  Skin:    General: Skin is warm and dry.  Neurological:     Mental Status:  She is alert and oriented to person, place, and time.  Psychiatric:        Attention and Perception: Attention normal.        Mood and Affect: Mood normal.        Speech: Speech normal.        Behavior: Behavior normal. Behavior is cooperative.    Results  for orders placed or performed in visit on 08/15/21  Vitamin B12  Result Value Ref Range   Vitamin B-12 1,851 (H) 180 - 914 pg/mL  Cancer antigen 27.29  Result Value Ref Range   CA 27.29 15.0 0.0 - 38.6 U/mL  Comprehensive metabolic panel  Result Value Ref Range   Sodium 137 135 - 145 mmol/L   Potassium 3.8 3.5 - 5.1 mmol/L   Chloride 103 98 - 111 mmol/L   CO2 27 22 - 32 mmol/L   Glucose, Bld 119 (H) 70 - 99 mg/dL   BUN 19 8 - 23 mg/dL   Creatinine, Ser 0.73 0.44 - 1.00 mg/dL   Calcium 9.6 8.9 - 10.3 mg/dL   Total Protein 7.3 6.5 - 8.1 g/dL   Albumin 4.1 3.5 - 5.0 g/dL   AST 18 15 - 41 U/L   ALT 18 0 - 44 U/L   Alkaline Phosphatase 65 38 - 126 U/L   Total Bilirubin 0.5 0.3 - 1.2 mg/dL   GFR, Estimated >60 >60 mL/min   Anion gap 7 5 - 15  CBC with Differential  Result Value Ref Range   WBC 7.7 4.0 - 10.5 K/uL   RBC 4.33 3.87 - 5.11 MIL/uL   Hemoglobin 13.0 12.0 - 15.0 g/dL   HCT 39.1 36.0 - 46.0 %   MCV 90.3 80.0 - 100.0 fL   MCH 30.0 26.0 - 34.0 pg   MCHC 33.2 30.0 - 36.0 g/dL   RDW 13.7 11.5 - 15.5 %   Platelets 218 150 - 400 K/uL   nRBC 0.0 0.0 - 0.2 %   Neutrophils Relative % 69 %   Neutro Abs 5.3 1.7 - 7.7 K/uL   Lymphocytes Relative 14 %   Lymphs Abs 1.1 0.7 - 4.0 K/uL   Monocytes Relative 11 %   Monocytes Absolute 0.8 0.1 - 1.0 K/uL   Eosinophils Relative 4 %   Eosinophils Absolute 0.3 0.0 - 0.5 K/uL   Basophils Relative 1 %   Basophils Absolute 0.1 0.0 - 0.1 K/uL   Immature Granulocytes 1 %   Abs Immature Granulocytes 0.10 (H) 0.00 - 0.07 K/uL      Assessment & Plan:   Problem List Items Addressed This Visit       Cardiovascular and Mediastinum   Essential hypertension    Chronic, with BP at  goal in office on recheck today -- often at goal at home, had one elevation at home recently.  Continue current medication regimen and adjust as needed + collaboration with cardiology.  Continue to monitor BP at home regularly and document + focus on DASH diet.  CMP today.  Return in 6 months.      Relevant Orders   Comprehensive metabolic panel     Respiratory   Asthma-COPD overlap syndrome (HCC) - Primary    Chronic, stable with Trelegy.  Continue collaboration with pulmonary and current medications as prescribed by them.  Recent note reviewed.      Obstructive apnea    Recommend she use her CPAP 100% of the time, currently about 80-90%.        Endocrine   IFG (impaired fasting glucose)    A1C today for recheck == A1c remains 5.8%.  Continue diet focus at home.      Relevant Orders   HgB A1c     Musculoskeletal and Integument   Osteopenia of lumbar spine    Noted on DEXA 08/04/19 -- continue Vitamin D daily + Prolia and  recommend repeat DEXA in 5 years.  Continue to collaborate with oncology, obtains Prolia with them.        Other   History of breast cancer    Ongoing, continue collaboration with oncology.  Recent notes reviewed.      Hyperlipidemia    Chronic, ongoing.  Again reviewed ASCVD score with patient and discussed risks, she refuses statin, does not wish to take.  Continue red yeast rice and obtain labs today.  Return in 6 months.      Relevant Orders   Comprehensive metabolic panel   Lipid Panel w/o Chol/HDL Ratio   Obesity    BMI 35.93.  Recommended eating smaller high protein, low fat meals more frequently and exercising 30 mins a day 5 times a week with a goal of 10-15lb weight loss in the next 3 months. Patient voiced their understanding and motivation to adhere to these recommendations.         Follow up plan: Return in about 6 months (around 05/25/2022) for HTN/HLD, GERD, ASTHMA, OSA, IFG, BREAST CA, OSTEOPENIA.

## 2021-11-22 NOTE — Assessment & Plan Note (Signed)
Noted on DEXA 08/04/19 -- continue Vitamin D daily + Prolia and recommend repeat DEXA in 5 years.  Continue to collaborate with oncology, obtains Prolia with them.

## 2021-11-22 NOTE — Assessment & Plan Note (Signed)
Ongoing, continue collaboration with oncology.  Recent notes reviewed. 

## 2021-11-22 NOTE — Assessment & Plan Note (Signed)
Chronic, ongoing.  Again reviewed ASCVD score with patient and discussed risks, she refuses statin, does not wish to take.  Continue red yeast rice and obtain labs today.  Return in 6 months. 

## 2021-11-22 NOTE — Assessment & Plan Note (Signed)
Chronic, with BP at goal in office on recheck today -- often at goal at home, had one elevation at home recently.  Continue current medication regimen and adjust as needed + collaboration with cardiology.  Continue to monitor BP at home regularly and document + focus on DASH diet.  CMP today.  Return in 6 months.

## 2021-11-22 NOTE — Assessment & Plan Note (Addendum)
Chronic, stable with Trelegy.  Continue collaboration with pulmonary and current medications as prescribed by them.  Recent note reviewed. 

## 2021-11-23 LAB — COMPREHENSIVE METABOLIC PANEL
ALT: 22 IU/L (ref 0–32)
AST: 17 IU/L (ref 0–40)
Albumin/Globulin Ratio: 2.3 — ABNORMAL HIGH (ref 1.2–2.2)
Albumin: 4.5 g/dL (ref 3.8–4.8)
Alkaline Phosphatase: 74 IU/L (ref 44–121)
BUN/Creatinine Ratio: 23 (ref 12–28)
BUN: 18 mg/dL (ref 8–27)
Bilirubin Total: 0.2 mg/dL (ref 0.0–1.2)
CO2: 24 mmol/L (ref 20–29)
Calcium: 9.9 mg/dL (ref 8.7–10.3)
Chloride: 102 mmol/L (ref 96–106)
Creatinine, Ser: 0.78 mg/dL (ref 0.57–1.00)
Globulin, Total: 2 g/dL (ref 1.5–4.5)
Glucose: 123 mg/dL — ABNORMAL HIGH (ref 70–99)
Potassium: 4.2 mmol/L (ref 3.5–5.2)
Sodium: 142 mmol/L (ref 134–144)
Total Protein: 6.5 g/dL (ref 6.0–8.5)
eGFR: 81 mL/min/{1.73_m2} (ref >59)

## 2021-11-23 LAB — LIPID PANEL W/O CHOL/HDL RATIO
Cholesterol, Total: 180 mg/dL (ref 100–199)
HDL: 48 mg/dL (ref >39)
LDL Chol Calc (NIH): 100 mg/dL — ABNORMAL HIGH (ref 0–99)
Triglycerides: 183 mg/dL — ABNORMAL HIGH (ref 0–149)
VLDL Cholesterol Cal: 32 mg/dL (ref 5–40)

## 2021-11-23 LAB — HEMOGLOBIN A1C
Est. average glucose Bld gHb Est-mCnc: 111 mg/dL
Hgb A1c MFr Bld: 5.5 % (ref 4.8–5.6)

## 2021-11-23 NOTE — Progress Notes (Signed)
Contacted via Landa morning Maddelyn, your labs have returned: - Kidney function, creatinine and eGFR, remains normal, as is liver function, AST and ALT.   - Cholesterol labs remain elevated, but have trended down a little.  Continue focus on diet changes and red yeast rice.   - A1c has improved and is out of prediabetes range at this time:) Any questions? Keep being amazing!!  Thank you for allowing me to participate in your care.  I appreciate you. Kindest regards, Vickki Igou

## 2021-11-29 DIAGNOSIS — E6609 Other obesity due to excess calories: Secondary | ICD-10-CM | POA: Diagnosis not present

## 2021-11-29 DIAGNOSIS — I10 Essential (primary) hypertension: Secondary | ICD-10-CM | POA: Diagnosis not present

## 2021-11-29 DIAGNOSIS — I519 Heart disease, unspecified: Secondary | ICD-10-CM | POA: Diagnosis not present

## 2021-11-29 DIAGNOSIS — Z6836 Body mass index (BMI) 36.0-36.9, adult: Secondary | ICD-10-CM | POA: Diagnosis not present

## 2021-11-29 DIAGNOSIS — R0602 Shortness of breath: Secondary | ICD-10-CM | POA: Diagnosis not present

## 2021-12-14 ENCOUNTER — Ambulatory Visit: Payer: Medicare Other | Admitting: Radiation Oncology

## 2021-12-21 ENCOUNTER — Ambulatory Visit: Payer: Medicare Other | Admitting: Radiation Oncology

## 2021-12-21 DIAGNOSIS — H02839 Dermatochalasis of unspecified eye, unspecified eyelid: Secondary | ICD-10-CM | POA: Diagnosis not present

## 2021-12-21 DIAGNOSIS — Z961 Presence of intraocular lens: Secondary | ICD-10-CM | POA: Diagnosis not present

## 2021-12-21 DIAGNOSIS — H43813 Vitreous degeneration, bilateral: Secondary | ICD-10-CM | POA: Diagnosis not present

## 2021-12-21 DIAGNOSIS — H5213 Myopia, bilateral: Secondary | ICD-10-CM | POA: Diagnosis not present

## 2021-12-21 DIAGNOSIS — H47323 Drusen of optic disc, bilateral: Secondary | ICD-10-CM | POA: Diagnosis not present

## 2021-12-21 DIAGNOSIS — H524 Presbyopia: Secondary | ICD-10-CM | POA: Diagnosis not present

## 2021-12-21 DIAGNOSIS — H52223 Regular astigmatism, bilateral: Secondary | ICD-10-CM | POA: Diagnosis not present

## 2022-01-16 ENCOUNTER — Ambulatory Visit: Payer: Medicare Other | Admitting: Radiation Oncology

## 2022-01-16 DIAGNOSIS — Z23 Encounter for immunization: Secondary | ICD-10-CM | POA: Diagnosis not present

## 2022-02-05 ENCOUNTER — Ambulatory Visit
Admission: RE | Admit: 2022-02-05 | Discharge: 2022-02-05 | Disposition: A | Discharge status: Home / Self Care | Payer: Medicare Other | Source: Ambulatory Visit | Attending: Radiation Oncology | Admitting: Radiation Oncology

## 2022-02-05 ENCOUNTER — Encounter: Payer: Self-pay | Admitting: Radiation Oncology

## 2022-02-05 VITALS — BP 174/81 | HR 65 | Temp 98.0°F | Resp 16 | Ht 67.0 in | Wt 233.0 lb

## 2022-02-05 DIAGNOSIS — C50411 Malignant neoplasm of upper-outer quadrant of right female breast: Secondary | ICD-10-CM | POA: Insufficient documentation

## 2022-02-05 DIAGNOSIS — Z923 Personal history of irradiation: Secondary | ICD-10-CM | POA: Insufficient documentation

## 2022-02-05 DIAGNOSIS — Z79811 Long term (current) use of aromatase inhibitors: Secondary | ICD-10-CM | POA: Insufficient documentation

## 2022-02-05 DIAGNOSIS — Z170 Estrogen receptor positive status [ER+]: Secondary | ICD-10-CM | POA: Insufficient documentation

## 2022-02-05 NOTE — Progress Notes (Signed)
Radiation Oncology Follow up Note  Name: Melissa Merritt   Date:   02/05/2022 MRN:  [id number] DOB: 12-26-1950    This 71 y.o. female presents to the clinic today for 2-year follow-up status post whole breast radiation to her right breast for stage I ER/PR positive invasive mammary carcinoma.  REFERRING PROVIDER: Venita Lick, NP  HPI: Patient is a.  71 year old female now at 2 years having completed whole breast radiation to her right breast for stage I ER/PR positive invasive mammary carcinoma.  Seen today in routine follow-up she is doing well.  She specifically denies breast tenderness cough or bone pain.  She had a mammogram back in April showing a 4 mm group of Occasions in the right breast for which stereotactic biopsy was performed showing fibroadenomatoid changes with no evidence of malignancy or atypical peripheral breast disease.  She is currently on Aromasin tolerating it well without side effect.  COMPLICATIONS OF TREATMENT: none  FOLLOW UP COMPLIANCE: keeps appointments   PHYSICAL EXAM:  BP (!) 174/81 (BP Location: Left Wrist, Patient Position: Sitting, Cuff Size: Small)   Pulse 65   Temp 98 F (36.7 C) (Tympanic)   Resp 16   Ht '5\' 7"'$  (1.702 m) Comment: stated HT  Wt 233 lb (105.7 kg)   BMI 36.49 kg/m  Lungs are clear to A&P cardiac examination essentially unremarkable with regular rate and rhythm. No dominant mass or nodularity is noted in either breast in 2 positions examined. Incision is well-healed. No axillary or supraclavicular adenopathy is appreciated. Cosmetic result is excellent.  Well-developed well-nourished patient in NAD. HEENT reveals PERLA, EOMI, discs not visualized.  Oral cavity is clear. No oral mucosal lesions are identified. Neck is clear without evidence of cervical or supraclavicular adenopathy. Lungs are clear to A&P. Cardiac examination is essentially unremarkable with regular rate and rhythm without murmur rub or thrill. Abdomen is benign with  no organomegaly or masses noted. Motor sensory and DTR levels are equal and symmetric in the upper and lower extremities. Cranial nerves II through XII are grossly intact. Proprioception is intact. No peripheral adenopathy or edema is identified. No motor or sensory levels are noted. Crude visual fields are within normal range.  RADIOLOGY RESULTS: Mammograms reviewed compatible with above-stated findings  PLAN: Present time patient is doing well now out over 2 years with no evidence of disease.  She is continuing to see surgeon as well as medical oncology and I will discontinue follow-up care.  I be happy to reevaluate the patient anytime should that be indicated.  Patient is to call with any concerns.  I would like to take this opportunity to thank you for allowing me to participate in the care of your patient.Noreene Filbert, MD

## 2022-02-07 ENCOUNTER — Other Ambulatory Visit: Payer: Self-pay | Admitting: Internal Medicine

## 2022-02-07 NOTE — Telephone Encounter (Signed)
Last MD visit 08/15/21:  Patient currently on Aromasin-tolerating well.  No worsening joint pains.  No worsening hot flashes.  Patient will need bilateral screening mammogram in April 2024.  Next visit with NP on 11/9  Refill request is for 90 day supply

## 2022-02-14 ENCOUNTER — Other Ambulatory Visit: Payer: Self-pay | Admitting: *Deleted

## 2022-02-14 DIAGNOSIS — Z170 Estrogen receptor positive status [ER+]: Secondary | ICD-10-CM

## 2022-02-15 ENCOUNTER — Inpatient Hospital Stay: Payer: Medicare Other

## 2022-02-15 ENCOUNTER — Other Ambulatory Visit: Payer: Self-pay

## 2022-02-15 ENCOUNTER — Inpatient Hospital Stay: Payer: Medicare Other | Attending: Internal Medicine

## 2022-02-15 ENCOUNTER — Ambulatory Visit: Payer: Medicare Other | Admitting: Internal Medicine

## 2022-02-15 ENCOUNTER — Inpatient Hospital Stay (HOSPITAL_BASED_OUTPATIENT_CLINIC_OR_DEPARTMENT_OTHER): Payer: Medicare Other | Admitting: Medical Oncology

## 2022-02-15 VITALS — BP 132/75 | HR 67 | Temp 98.0°F | Wt 233.0 lb

## 2022-02-15 DIAGNOSIS — E538 Deficiency of other specified B group vitamins: Secondary | ICD-10-CM | POA: Diagnosis not present

## 2022-02-15 DIAGNOSIS — I509 Heart failure, unspecified: Secondary | ICD-10-CM | POA: Insufficient documentation

## 2022-02-15 DIAGNOSIS — Z923 Personal history of irradiation: Secondary | ICD-10-CM | POA: Diagnosis not present

## 2022-02-15 DIAGNOSIS — M8588 Other specified disorders of bone density and structure, other site: Secondary | ICD-10-CM

## 2022-02-15 DIAGNOSIS — M8580 Other specified disorders of bone density and structure, unspecified site: Secondary | ICD-10-CM | POA: Insufficient documentation

## 2022-02-15 DIAGNOSIS — Z87891 Personal history of nicotine dependence: Secondary | ICD-10-CM | POA: Diagnosis not present

## 2022-02-15 DIAGNOSIS — Z803 Family history of malignant neoplasm of breast: Secondary | ICD-10-CM | POA: Diagnosis not present

## 2022-02-15 DIAGNOSIS — Z1231 Encounter for screening mammogram for malignant neoplasm of breast: Secondary | ICD-10-CM

## 2022-02-15 DIAGNOSIS — Z170 Estrogen receptor positive status [ER+]: Secondary | ICD-10-CM

## 2022-02-15 DIAGNOSIS — Z801 Family history of malignant neoplasm of trachea, bronchus and lung: Secondary | ICD-10-CM | POA: Diagnosis not present

## 2022-02-15 DIAGNOSIS — Z9889 Other specified postprocedural states: Secondary | ICD-10-CM

## 2022-02-15 DIAGNOSIS — C50911 Malignant neoplasm of unspecified site of right female breast: Secondary | ICD-10-CM | POA: Insufficient documentation

## 2022-02-15 DIAGNOSIS — I110 Hypertensive heart disease with heart failure: Secondary | ICD-10-CM | POA: Diagnosis not present

## 2022-02-15 DIAGNOSIS — Z853 Personal history of malignant neoplasm of breast: Secondary | ICD-10-CM

## 2022-02-15 DIAGNOSIS — C50811 Malignant neoplasm of overlapping sites of right female breast: Secondary | ICD-10-CM | POA: Diagnosis not present

## 2022-02-15 DIAGNOSIS — G4700 Insomnia, unspecified: Secondary | ICD-10-CM | POA: Diagnosis not present

## 2022-02-15 DIAGNOSIS — Z79811 Long term (current) use of aromatase inhibitors: Secondary | ICD-10-CM | POA: Insufficient documentation

## 2022-02-15 DIAGNOSIS — D509 Iron deficiency anemia, unspecified: Secondary | ICD-10-CM | POA: Diagnosis not present

## 2022-02-15 LAB — CBC WITH DIFFERENTIAL/PLATELET
Abs Immature Granulocytes: 0.1 10*3/uL — ABNORMAL HIGH (ref 0.00–0.07)
Basophils Absolute: 0.1 10*3/uL (ref 0.0–0.1)
Basophils Relative: 1 %
Eosinophils Absolute: 0.4 10*3/uL (ref 0.0–0.5)
Eosinophils Relative: 5 %
HCT: 40.3 % (ref 36.0–46.0)
Hemoglobin: 13.5 g/dL (ref 12.0–15.0)
Immature Granulocytes: 1 %
Lymphocytes Relative: 16 %
Lymphs Abs: 1.1 10*3/uL (ref 0.7–4.0)
MCH: 30.5 pg (ref 26.0–34.0)
MCHC: 33.5 g/dL (ref 30.0–36.0)
MCV: 91 fL (ref 80.0–100.0)
Monocytes Absolute: 0.7 10*3/uL (ref 0.1–1.0)
Monocytes Relative: 10 %
Neutro Abs: 4.9 10*3/uL (ref 1.7–7.7)
Neutrophils Relative %: 67 %
Platelets: 216 10*3/uL (ref 150–400)
RBC: 4.43 MIL/uL (ref 3.87–5.11)
RDW: 13.2 % (ref 11.5–15.5)
WBC: 7.3 10*3/uL (ref 4.0–10.5)
nRBC: 0 % (ref 0.0–0.2)

## 2022-02-15 LAB — COMPREHENSIVE METABOLIC PANEL
ALT: 19 U/L (ref 0–44)
AST: 22 U/L (ref 15–41)
Albumin: 4.1 g/dL (ref 3.5–5.0)
Alkaline Phosphatase: 65 U/L (ref 38–126)
Anion gap: 8 (ref 5–15)
BUN: 19 mg/dL (ref 8–23)
CO2: 26 mmol/L (ref 22–32)
Calcium: 9.8 mg/dL (ref 8.9–10.3)
Chloride: 103 mmol/L (ref 98–111)
Creatinine, Ser: 0.91 mg/dL (ref 0.44–1.00)
GFR, Estimated: >60 mL/min (ref >60)
Glucose, Bld: 156 mg/dL — ABNORMAL HIGH (ref 70–99)
Potassium: 3.7 mmol/L (ref 3.5–5.1)
Sodium: 137 mmol/L (ref 135–145)
Total Bilirubin: 0.3 mg/dL (ref 0.3–1.2)
Total Protein: 7.3 g/dL (ref 6.5–8.1)

## 2022-02-15 LAB — VITAMIN B12: Vitamin B-12: 2634 pg/mL — ABNORMAL HIGH (ref 180–914)

## 2022-02-15 MED ORDER — DENOSUMAB 60 MG/ML ~~LOC~~ SOSY
60.0000 mg | PREFILLED_SYRINGE | Freq: Once | SUBCUTANEOUS | Status: AC
  Administered 2022-02-15: 60 mg via SUBCUTANEOUS
  Filled 2022-02-15: qty 1

## 2022-02-15 NOTE — Progress Notes (Signed)
[place] NOTE  Patient Care Team: Venita Lick, NP as PCP - General (Nurse Practitioner) Idelle Leech, OD (Optometry) Isaias Cowman, MD as Consulting Physician (Cardiology) Manya Silvas, MD (Inactive) (Gastroenterology) Lucky Cowboy Erskine Squibb, MD as Referring Physician (Vascular Surgery) Clabe Seal, Benny Lennert, MD (Dermatology) Theodore Demark, RN (Inactive) as Oncology Nurse Navigator Benjamine Sprague, DO as Consulting Physician (Surgery) Noreene Filbert, MD as Radiation Oncologist (Radiation Oncology) Cammie Sickle, MD as Consulting Physician (Hematology and Oncology)  CHIEF COMPLAINTS/PURPOSE OF CONSULTATION: right breast cancer  Oncology History Overview Note  Bilateral diagnostic mammogram on 07/08/2019 revealed persistent architectural distortion involving the upper outer quadrant of the right breast without sonographic correlate.  There was no pathologic right axillary lymphadenopathy.  No mammographic or sonographic evidence of malignancy in the left breast.  CA27.29 was 19.0 (07/24/19) at diagnosis.   Patient underwent stereotactic biopsy on 07/17/2019.  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  Intermediate nuclear grade with central necrosis.  Consistent with stage Ia right breast cancer.  She underwent partial mastectomy and right axillary sentinel lymph node biopsy on 08/13/2019.  Pathology revealed grade 1 invasive mammary carcinoma, no special type, and intermediate grade DCIS.  There is atypical lobular hyperplasia with ductal involvement.  Additional posterior margin revealed no evidence of residual carcinoma or DCIS.  Closest margin was > 10 mm and 2 mm for DCIS.  1 lymph node was negative for malignancy.  Tumor was ER/PR positive and HER-2/neu negative.  Pathologic stage was pT1a pN0 (sn).   She received 5040 cGy to her right breast and 1400 cGy boost to her scar from 09/28/2019-11/17/2019.  She  began tamoxifen on 11/27/2019.  Tamoxifen discontinued in December 2021 due to intolerable side effects.  She was switched to Femara, started 04/12/20.  stopped in 9th May 2022 sec to leg swelling.  # AUG 2022- STARTED AROMASIN  Baseline bone density scan in April 2021 consistent with osteopenia with T score of -1.5. Plan for prolia.    History of breast cancer  07/24/2019 Initial Diagnosis   Malignant neoplasm of right breast in female, estrogen receptor positive ([place])   Carcinoma of overlapping sites of right breast in female, estrogen receptor positive ([place]) (Resolved)  02/14/2021 Initial Diagnosis   Carcinoma of overlapping sites of right breast in female, estrogen receptor positive ([place])      HISTORY OF PRESENTING ILLNESS:  Melissa Merritt 71 y.o.  female with stage I ER/PR positive HER2 negative breast cancer is here for follow-up/review results of her breast biopsy.  Today she reports that She has been doing well. Has been happy and feeling well- has gained some weight due to this per patient.   Patient denies any worsening hot flashes or swelling in the legs.  Appetite is good.  No weight loss. She denies any new breast lumps, bumps, skin changes, lymphadenopathy or concerns. She is due for her next mammogram in April 2023. She had her follow up with Dr. Baruch Gouty last week and reports no concerns.   Sleeping has not changed but is not worse per patient   She is due for her Prolia today. She has tolerated this well. No recent fractures.   Wt Readings from Last 3 Encounters:  02/15/22 233 lb (105.7 kg)  02/05/22 233 lb (105.7 kg)  11/22/21 231 lb 1.6 oz (104.8 kg)    Review of Systems  Constitutional:  Negative for chills, diaphoresis, fever and malaise/fatigue.  HENT:  Negative for nosebleeds and sore throat.   Eyes:  Negative for double vision.  Respiratory:  Negative for cough, hemoptysis, sputum production, shortness of breath and wheezing.   Cardiovascular:  Negative for  chest pain, palpitations, orthopnea and leg swelling.  Gastrointestinal:  Negative for abdominal pain, blood in stool, constipation, diarrhea, heartburn, melena, nausea and vomiting.  Genitourinary:  Negative for dysuria, frequency and urgency.  Musculoskeletal:  Negative for back pain and joint pain.  Skin: Negative.  Negative for itching and rash.  Neurological:  Negative for dizziness, tingling, focal weakness, weakness and headaches.  Endo/Heme/Allergies:  Does not bruise/bleed easily.  Psychiatric/Behavioral:  Negative for depression. The patient is not nervous/anxious and does not have insomnia.      MEDICAL HISTORY:  Past Medical History:  Diagnosis Date   Benign tumor of lung    Breast cancer ([place])    Cancer ([place])    right breast   CHF (congestive heart failure) (HCC)    Dyspnea    GERD (gastroesophageal reflux disease)    Hyperlipidemia    Hypertension    Insomnia    Osteopenia    Personal history of radiation therapy    Plantar fasciitis    PONV (postoperative nausea and vomiting)    vomiting 2 days after lung surgery   S/P pneumonectomy    right lower lobe   Urinary incontinence     SURGICAL HISTORY: Past Surgical History:  Procedure Laterality Date   BREAST BIOPSY Right 07/17/2019   Affirm bx-"Ribbon" clip-[facility] & DCIS   BREAST BIOPSY Right 07/31/2021   Atereo bx, x-clip, path pending   BREAST LUMPECTOMY Right 2021   [facility], DCIS, 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   bronchoscopy     CATARACT EXTRACTION, BILATERAL     Left- 11/21/15, right- 12/23/15   CHOLECYSTECTOMY     CYSTECTOMY  11/03/2009   cheek   dilation and curretage     EYE SURGERY Bilateral    cataract   FOOT SURGERY Bilateral 2010   LOBECTOMY     LUNG SURGERY Right    PART MASTECTOMY,RADIO FREQUENCY LOCALIZER,AXILLARY SENTINEL NODE BIOPSY Right 08/13/2019   Procedure: PART MASTECTOMY,RADIO FREQUENCY LOCALIZER,AXILLARY SENTINEL NODE BIOPSY;  Surgeon: Benjamine Sprague, DO;  Location: [facility] ORS;  Service:  General;  Laterality: Right;   UPPER GI ENDOSCOPY  2005    SOCIAL HISTORY: Social History   Socioeconomic History   Marital status: Married    Spouse name: Not on file   Number of children: Not on file   Years of education: Not on file   Highest education level: Some college, no degree  Occupational History   Occupation: retired   Tobacco Use   Smoking status: Former    Types: Cigarettes    Quit date: 07/09/1979    Years since quitting: 42.6   Smokeless tobacco: Never  Vaping Use   Vaping Use: Never used  Substance and Sexual Activity   Alcohol use: No   Drug use: No   Sexual activity: Yes  Other Topics Concern   Not on file  Social History Narrative   Not on file   Social Determinants of Health   Financial Resource Strain: Low Risk  (06/01/2021)   Overall Financial Resource Strain (CARDIA)    Difficulty of Paying Living Expenses: Not hard at all  Food Insecurity: No Food Insecurity (06/01/2021)   Hunger Vital Sign    Worried About Running Out of Food in the Last Year: Never true    Ran Out  of Food in the Last Year: Never true  Transportation Needs: No Transportation Needs (06/01/2021)   PRAPARE - Hydrologist (Medical): No    Lack of Transportation (Non-Medical): No  Physical Activity: Inactive (06/01/2021)   Exercise Vital Sign    Days of Exercise per Week: 0 days    Minutes of Exercise per Session: 0 min  Stress: No Stress Concern Present (06/01/2021)   [place]    Feeling of Stress : Not at all  Social Connections: Moderately Integrated (06/01/2021)   Social Connection and Isolation Panel [NHANES]    Frequency of Communication with Friends and Family: More than three times a week    Frequency of Social Gatherings with Friends and Family: More than three times a week    Attends Religious Services: More than 4 times per year    Active Member of [employer] or Organizations: No     Attends Music therapist: Not on file    Marital Status: Married  Human resources officer Violence: Not At Risk (06/01/2021)   Humiliation, Afraid, Rape, and Kick questionnaire    Fear of Current or Ex-Partner: No    Emotionally Abused: No    Physically Abused: No    Sexually Abused: No    FAMILY HISTORY: Family History  Problem Relation Age of Onset   COPD Mother    Hypertension Mother    Emphysema Mother    Osteoporosis Mother    Heart disease Father    Hyperlipidemia Sister    Hyperlipidemia Brother    Heart disease Maternal Grandmother    Heart disease Maternal Grandfather    Heart disease Paternal Grandmother    Stroke Paternal Grandmother    Heart disease Paternal Grandfather    Stroke Paternal Grandfather    Cancer Maternal Aunt        breast and lung   Osteoporosis Maternal Aunt    Aortic aneurysm Maternal Aunt    Breast cancer Maternal Aunt 76    ALLERGIES:  is allergic to contrast media [iodinated contrast media], other, ace inhibitors, hctz [hydrochlorothiazide], and keflex [cephalexin].  MEDICATIONS:  Current Outpatient Medications  Medication Sig Dispense Refill   amLODipine (NORVASC) 5 MG tablet Take 1 tablet (5 mg total) by mouth daily. 90 tablet 4   Biotin 5000 MCG TABS Take 5,000 mcg by mouth 2 (two) times daily.     Calcium Carb-Cholecalciferol (CALCIUM-VITAMIN D) 600-400 MG-UNIT TABS Take 1 tablet by mouth 2 (two) times daily.     cholecalciferol (VITAMIN D3) 25 MCG (1000 UNIT) tablet Take 1,000 Units by mouth in the morning and at bedtime.     Cyanocobalamin (B-12) 2500 MCG TABS Take 2,500 mcg by mouth daily.     denosumab (PROLIA) 60 MG/ML SOSY injection Inject 60 mg into the skin every 6 (six) months.     exemestane (AROMASIN) 25 MG tablet Take 1 tablet (25 mg total) by mouth daily after breakfast. 90 tablet 0   ferrous sulfate 220 (44 Fe) MG/5ML solution Take 220 mg by mouth daily.     metoprolol succinate (TOPROL-XL) 25 MG 24 hr tablet  Take 1 tablet (25 mg total) by mouth daily. 90 tablet 4   pantoprazole (PROTONIX) 40 MG tablet Take 1 tablet (40 mg total) by mouth 2 (two) times daily. 180 tablet 4   Red Yeast Rice Extract (RED YEAST RICE PO) Take 1 tablet by mouth in the morning and at bedtime.  St Johns Wort 300 MG CAPS Take 300 mg by mouth 2 (two) times daily.     TRELEGY ELLIPTA 100-62.5-25 MCG/INH AEPB Inhale 1 puff into the lungs daily.     valsartan (DIOVAN) 160 MG tablet Take 1 tablet (160 mg total) by mouth daily. 90 tablet 4   furosemide (LASIX) 20 MG tablet Take by mouth.     ibuprofen (ADVIL) 200 MG tablet Take 400 mg by mouth every 6 (six) hours as needed for moderate pain.      No current facility-administered medications for this visit.      Marland Kitchen  PHYSICAL EXAMINATION: ECOG PERFORMANCE STATUS: 0 - Asymptomatic  Vitals:   02/15/22 1018  BP: 132/75  Pulse: 67  Temp: 98 F (36.7 C)    Filed Weights   02/15/22 1018  Weight: 233 lb (105.7 kg)    Physical Exam Vitals and nursing note reviewed.  Constitutional:      Comments: Alone.  Ambulating independently.  HENT:     Head: Normocephalic and atraumatic.     Mouth/Throat:     Pharynx: Oropharynx is clear.  Eyes:     Extraocular Movements: Extraocular movements intact.     Pupils: Pupils are equal, round, and reactive to light.  Cardiovascular:     Rate and Rhythm: Normal rate and regular rhythm.  Pulmonary:     Comments: Decreased breath sounds bilaterally.  Abdominal:     Palpations: Abdomen is soft.  Musculoskeletal:        General: Normal range of motion.     Cervical back: Normal range of motion.  Skin:    General: Skin is warm.  Neurological:     General: No focal deficit present.     Mental Status: She is alert and oriented to person, place, and time.  Psychiatric:        Behavior: Behavior normal.        Judgment: Judgment normal.      LABORATORY DATA:  I have reviewed the data as listed Lab Results  Component Value  Date   WBC 7.3 02/15/2022   HGB 13.5 02/15/2022   HCT 40.3 02/15/2022   MCV 91.0 02/15/2022   PLT 216 02/15/2022   Recent Labs    08/15/21 1016 11/22/21 0840 02/15/22 0957  NA 137 142 137  K 3.8 4.2 3.7  CL 103 102 103  CO2 _0 GLUCOSE 119* 123* 156*  BUN _1 CREATININE 0.73 0.78 0.91  CALCIUM 9.6 9.9 9.8  GFRNONAA >60  --  >60  PROT 7.3 6.5 7.3  ALBUMIN 4.1 4.5 4.1  AST _2 ALT _3 ALKPHOS 65 74 65  BILITOT 0.5 0.2 0.3    RADIOGRAPHIC STUDIES: I have personally reviewed the radiological images as listed and agreed with the findings in the report. No results found.  ASSESSMENT & PLAN:  Encounter Diagnoses  Name Primary?   Carcinoma of overlapping sites of right breast in female, estrogen receptor positive ([place]) Yes   Osteopenia of lumbar spine    Iron deficiency anemia, unspecified iron deficiency anemia type    B12 deficiency     Carcinoma of overlapping sites of right breast in female, estrogen receptor positive ([place]) #Stage Ia right breast cancer status postlumpectomy;  ER/PR positive, HER2/neu negative.s/p adjuvant radiation therapy on 11/17/2019.  Currently on Aromasin.APRIl 2023-abnormal mammogram recent breast biopsy-negative for any malignancy.    # Patient currently on Aromasin-tolerating well. No new concerns today  suggestive of recurrence. Defers breast exam as she had one completed last week by Dr. Baruch Gouty. Bilateral screening mammogram in April 2024.   #  Osteopenia-bone density scan in April 2021 consistent with osteopenia.   Continue calcium 1200 mg and vitamin D 1000 IU daily along with weightbearing exercise as tolerated. DEXA due in April 2024.    #   Iron deficiency anemia-history of iron deficiency anemia. Has been stable on oral iron with today's Hgb being 13.5   # B12 deficiency.  History of B12 deficiency; FEB 2023- 1800. recommend on oral B12 3 times a week- B12 level pending today. At this time no changes to the plan.                # DISPOSITION: # Prolia today # follow up- in 6 months- MD; cbc/cmp; ca27-29; b12; prolia, mammogram and DEXA in April 2024    All questions were answered. The patient knows to call the clinic with any problems, questions or concerns.    Hughie Closs, PA-C 02/15/2022 10:55 AM

## 2022-02-16 LAB — CANCER ANTIGEN 27.29: CA 27.29: <9 U/mL (ref 0.0–38.6)

## 2022-03-29 DIAGNOSIS — J449 Chronic obstructive pulmonary disease, unspecified: Secondary | ICD-10-CM | POA: Diagnosis not present

## 2022-05-09 ENCOUNTER — Other Ambulatory Visit: Payer: Self-pay | Admitting: Internal Medicine

## 2022-05-14 ENCOUNTER — Encounter: Payer: Self-pay | Admitting: Internal Medicine

## 2022-05-14 ENCOUNTER — Ambulatory Visit: Payer: Self-pay | Admitting: *Deleted

## 2022-05-14 NOTE — Telephone Encounter (Signed)
  Chief Complaint: Abdominal Pain Symptoms: 7/10 upper abdomen "Little higher" than sternum.  Vomiting bile, urinating very little. Frequency: Saturday Pertinent Negatives: Patient denies  Disposition: '[x]'$ ED /'[]'$ Urgent Care (no appt availability in office) / '[]'$ Appointment(In office/virtual)/ '[]'$  [place] [facility]/ '[]'$ Home Care/ '[]'$ Refused Recommended Disposition /'[]'$ [place] [facility]/ '[]'$  Follow-up with PCP Additional Notes: Advised ED, states will follow disposition. Care advise provided, verbalizes understanding. Reason for Disposition  [1] Vomiting AND [2] contains bile (green color)  Answer Assessment - Initial Assessment Questions 1. LOCATION: "Where does it hurt?"      Upper stomach, "High" 2. RADIATION: "Does the pain shoot anywhere else?" (e.g., chest, back)     NO 3. ONSET: "When did the pain begin?" (e.g., minutes, hours or days ago)      Saturday 4. SUDDEN: "Gradual or sudden onset?"     Gradual 5. PATTERN "Does the pain come and go, or is it constant?"    - If it comes and goes: "How long does it last?" "Do you have pain now?"     (Note: Comes and goes means the pain is intermittent. It goes away completely between bouts.)    - If constant: "Is it getting better, staying the same, or getting worse?"      (Note: Constant means the pain never goes away completely; most serious pain is constant and gets worse.)      Comes and goes 6. SEVERITY: "How bad is the pain?"  (e.g., Scale 1-10; mild, moderate, or severe)    - MILD (1-3): Doesn't interfere with normal activities, abdomen soft and not tender to touch..     - MODERATE (4-7): Interferes with normal activities or awakens from sleep, abdomen tender to touch.     - SEVERE (8-10): Excruciating pain, doubled over, unable to do any normal activities.       7/10 7. RECURRENT SYMPTOM: "Have you ever had this type of stomach pain before?" If Yes, ask: "When was the last time?" and "What happened that time?"       8.  AGGRAVATING FACTORS: "Does anything seem to cause this pain?" (e.g., foods, stress, alcohol)  9. CARDIAC SYMPTOMS: "Do you have any of the following symptoms: chest pain, difficulty breathing, sweating, nausea?"      10. OTHER SYMPTOMS: "Do you have any other symptoms?" (e.g., back pain, diarrhea, fever, urination pain, vomiting)       Vomiting, Greenish, bile since Saturday, little amount of voiding  Protocols used: Abdominal Pain - Upper-A-AH

## 2022-05-15 ENCOUNTER — Ambulatory Visit: Payer: Self-pay

## 2022-05-15 ENCOUNTER — Encounter: Payer: Self-pay | Admitting: Family Medicine

## 2022-05-15 ENCOUNTER — Encounter: Payer: Self-pay | Admitting: Internal Medicine

## 2022-05-15 ENCOUNTER — Ambulatory Visit (INDEPENDENT_AMBULATORY_CARE_PROVIDER_SITE_OTHER): Payer: Medicare Other | Admitting: Family Medicine

## 2022-05-15 VITALS — BP 109/68 | HR 73 | Temp 98.4°F | Wt 222.7 lb

## 2022-05-15 DIAGNOSIS — A09 Infectious gastroenteritis and colitis, unspecified: Secondary | ICD-10-CM | POA: Diagnosis not present

## 2022-05-15 MED ORDER — ONDANSETRON HCL 4 MG PO TABS: 4.0000 mg | ORAL_TABLET | Freq: Three times a day (TID) | ORAL | 0 refills | Status: DC | PRN

## 2022-05-15 MED ORDER — PROMETHAZINE HCL 50 MG/ML IJ SOLN: 25.0000 mg | Freq: Once | INTRAMUSCULAR | Status: DC

## 2022-05-15 MED ORDER — PROMETHAZINE HCL 25 MG/ML IJ SOLN
25.0000 mg | Freq: Four times a day (QID) | INTRAMUSCULAR | Status: DC | PRN
  Administered 2022-05-15: 25 mg via INTRAMUSCULAR

## 2022-05-15 NOTE — Telephone Encounter (Signed)
   Chief Complaint: Diarrhea Symptoms: Vomiting "better, none today." Frequency: Saturday Pertinent Negatives: Patient denies fever Disposition: '[]'$ ED /'[]'$ Urgent Care (no appt availability in office) / '[x]'$ Appointment(In office/virtual)/ '[]'$  [place] [facility]/ '[]'$ Home Care/ '[]'$ Refused Recommended Disposition /'[]'$ [facility] [facility]/ '[]'$  Follow-up with PCP Additional Notes:  States EMS gave me fluids yesterday. Reason for Disposition  [1] MODERATE diarrhea (e.g., 4-6 times / day more than normal) AND [2] present > 48 hours (2 days)  Answer Assessment - Initial Assessment Questions 1. DIARRHEA SEVERITY: "How bad is the diarrhea?" "How many more stools have you had in the past 24 hours than normal?"    - NO DIARRHEA (SCALE 0)   - MILD (SCALE 1-3): Few loose or mushy BMs; increase of 1-3 stools over normal daily number of stools; mild increase in ostomy output.   -  MODERATE (SCALE 4-7): Increase of 4-6 stools daily over normal; moderate increase in ostomy output.   -  SEVERE (SCALE 8-10; OR "WORST POSSIBLE"): Increase of 7 or more stools daily over normal; moderate increase in ostomy output; incontinence.     Moderate 2. ONSET: "When did the diarrhea begin?"      Saturday 3. BM CONSISTENCY: "How loose or watery is the diarrhea?"      Watery 4. VOMITING: "Are you also vomiting?" If Yes, ask: "How many times in the past 24 hours?"      Not today 5. ABDOMEN PAIN: "Are you having any abdomen pain?" If Yes, ask: "What does it feel like?" (e.g., crampy, dull, intermittent, constant)      Yes 6. ABDOMEN PAIN SEVERITY: If present, ask: "How bad is the pain?"  (e.g., Scale 1-10; mild, moderate, or severe)   - MILD (1-3): doesn't interfere with normal activities, abdomen soft and not tender to touch    - MODERATE (4-7): interferes with normal activities or awakens from sleep, abdomen tender to touch    - SEVERE (8-10): excruciating pain, doubled over, unable to do any normal activities        Moderate 7. ORAL INTAKE: If vomiting, "Have you been able to drink liquids?" "How much liquids have you had in the past 24 hours?"     Yes 8. HYDRATION: "Any signs of dehydration?" (e.g., dry mouth [not just dry lips], too weak to stand, dizziness, new weight loss) "When did you last urinate?"     No 9. EXPOSURE: "Have you traveled to a foreign country recently?" "Have you been exposed to anyone with diarrhea?" "Could you have eaten any food that was spoiled?"     No 10. ANTIBIOTIC USE: "Are you taking antibiotics now or have you taken antibiotics in the past 2 months?"       No 11. OTHER SYMPTOMS: "Do you have any other symptoms?" (e.g., fever, blood in stool)       No 12. PREGNANCY: "Is there any chance you are pregnant?" "When was your last menstrual period?"       No  Protocols used: [facility]

## 2022-05-15 NOTE — Addendum Note (Signed)
Addended by: Irena Reichmann on: 05/15/2022 04:13 PM   Modules accepted: Orders

## 2022-05-15 NOTE — Progress Notes (Signed)
BP 109/68   Pulse 73   Temp 98.4 F (36.9 C) (Oral)   Wt 222 lb 11.2 oz (101 kg)   SpO2 95%   BMI 34.88 kg/m    Subjective:    Patient ID: Melissa Merritt, female    DOB: 1950-10-19, 72 y.o.   MRN: [id number]  HPI: Melissa Merritt is a 72 y.o. female  Chief Complaint  Patient presents with   Abdominal Pain    Patient says her symptoms started on Saturday, and says she has been throwing up, diarrhea and abdominal pain. Patient says she received an IV fluid treatment yesterday due to her son being the [employer] administrator. Patient has tried Adult Pedialyte.    Diarrhea   Emesis   GASTROENTERITIS Duration: Saturday Diarrhea: yes non-bloody  Episodes of diarrhea/day: 2-+ bouts of diarrhea Nausea: yes Vomiting: no Episodes of vomit/day: 20+ times- none since yesterday Abdominal pain: yes Fever: no Decreased appetite: yes Tolerating liquids: yes Foreign travel: no Relevant dietary history: none Similar illness in contacts: no Recent antibiotic use: no Status: better Treatments attempted:   Relevant past medical, surgical, family and social history reviewed and updated as indicated. Interim medical history since our last visit reviewed. Allergies and medications reviewed and updated.  Review of Systems  Constitutional:  Positive for appetite change and fatigue. Negative for activity change, chills, diaphoresis, fever and unexpected weight change.  HENT: Negative.    Respiratory: Negative.    Cardiovascular: Negative.   Gastrointestinal:  Positive for abdominal pain, diarrhea, nausea and vomiting. Negative for abdominal distention, anal bleeding, blood in stool, constipation and rectal pain.  Genitourinary: Negative.   Musculoskeletal: Negative.   Skin: Negative.   Neurological: Negative.   Psychiatric/Behavioral: Negative.      Per HPI unless specifically indicated above     Objective:    BP 109/68   Pulse 73   Temp 98.4 F (36.9 C) (Oral)   Wt 222 lb 11.2 oz (101  kg)   SpO2 95%   BMI 34.88 kg/m   Wt Readings from Last 3 Encounters:  05/15/22 222 lb 11.2 oz (101 kg)  02/15/22 233 lb (105.7 kg)  02/05/22 233 lb (105.7 kg)    Physical Exam Vitals and nursing note reviewed.  Constitutional:      General: She is not in acute distress.    Appearance: Normal appearance. She is well-developed. She is obese. She is not ill-appearing, toxic-appearing or diaphoretic.  HENT:     Head: Normocephalic and atraumatic.     Right Ear: External ear normal.     Left Ear: External ear normal.     Nose: Nose normal.     Mouth/Throat:     Mouth: Mucous membranes are moist.     Pharynx: Oropharynx is clear.  Eyes:     General: No scleral icterus.       Right eye: No discharge.        Left eye: No discharge.     Extraocular Movements: Extraocular movements intact.     Conjunctiva/sclera: Conjunctivae normal.     Pupils: Pupils are equal, round, and reactive to light.  Cardiovascular:     Rate and Rhythm: Normal rate and regular rhythm.     Pulses: Normal pulses.     Heart sounds: Normal heart sounds. No murmur heard.    No friction rub. No gallop.  Pulmonary:     Effort: Pulmonary effort is normal. No respiratory distress.     Breath sounds: Normal breath  sounds. No stridor. No wheezing, rhonchi or rales.  Chest:     Chest wall: No tenderness.  Abdominal:     General: Abdomen is flat. Bowel sounds are normal. There is no distension or abdominal bruit. There are no signs of injury.     Palpations: There is no shifting dullness, fluid wave, hepatomegaly, splenomegaly, mass or pulsatile mass.     Tenderness: There is abdominal tenderness in the epigastric area.     Hernia: No hernia is present.  Musculoskeletal:        General: Normal range of motion.     Cervical back: Normal range of motion and neck supple.  Skin:    General: Skin is warm and dry.     Capillary Refill: Capillary refill takes less than 2 seconds.     Coloration: Skin is not jaundiced  or pale.     Findings: No bruising, erythema, lesion or rash.  Neurological:     General: No focal deficit present.     Mental Status: She is alert and oriented to person, place, and time. Mental status is at baseline.  Psychiatric:        Mood and Affect: Mood normal.        Behavior: Behavior normal.        Thought Content: Thought content normal.        Judgment: Judgment normal.     Results for orders placed or performed in visit on 02/15/22  Comprehensive metabolic panel  Result Value Ref Range   Sodium 137 135 - 145 mmol/L   Potassium 3.7 3.5 - 5.1 mmol/L   Chloride 103 98 - 111 mmol/L   CO2 26 22 - 32 mmol/L   Glucose, Bld 156 (H) 70 - 99 mg/dL   BUN 19 8 - 23 mg/dL   Creatinine, Ser 0.91 0.44 - 1.00 mg/dL   Calcium 9.8 8.9 - 10.3 mg/dL   Total Protein 7.3 6.5 - 8.1 g/dL   Albumin 4.1 3.5 - 5.0 g/dL   AST 22 15 - 41 U/L   ALT 19 0 - 44 U/L   Alkaline Phosphatase 65 38 - 126 U/L   Total Bilirubin 0.3 0.3 - 1.2 mg/dL   GFR, Estimated >60 >60 mL/min   Anion gap 8 5 - 15  Vitamin B12  Result Value Ref Range   Vitamin B-12 2,634 (H) 180 - 914 pg/mL  Cancer antigen 27.29  Result Value Ref Range   CA 27.29 <9.0 0.0 - 38.6 U/mL  CBC with Differential/Platelet  Result Value Ref Range   WBC 7.3 4.0 - 10.5 K/uL   RBC 4.43 3.87 - 5.11 MIL/uL   Hemoglobin 13.5 12.0 - 15.0 g/dL   HCT 40.3 36.0 - 46.0 %   MCV 91.0 80.0 - 100.0 fL   MCH 30.5 26.0 - 34.0 pg   MCHC 33.5 30.0 - 36.0 g/dL   RDW 13.2 11.5 - 15.5 %   Platelets 216 150 - 400 K/uL   nRBC 0.0 0.0 - 0.2 %   Neutrophils Relative % 67 %   Neutro Abs 4.9 1.7 - 7.7 K/uL   Lymphocytes Relative 16 %   Lymphs Abs 1.1 0.7 - 4.0 K/uL   Monocytes Relative 10 %   Monocytes Absolute 0.7 0.1 - 1.0 K/uL   Eosinophils Relative 5 %   Eosinophils Absolute 0.4 0.0 - 0.5 K/uL   Basophils Relative 1 %   Basophils Absolute 0.1 0.0 - 0.1 K/uL   Immature Granulocytes 1 %  Abs Immature Granulocytes 0.10 (H) 0.00 - 0.07 K/uL       Assessment & Plan:   Problem List Items Addressed This Visit   None Visit Diagnoses     Diarrhea of infectious origin    -  Primary   Likely GI bug- will check labs and treat with zofran/phenergan/fluids/rest. Call if not better tomorrow and will obtain CT scan and stool studies.   Relevant Medications   promethazine (PHENERGAN) injection 25 mg (Start on 05/15/2022  3:30 PM)   Other Relevant Orders   Comprehensive metabolic panel   Lipase   Amylase   CBC with Differential/Platelet   Urinalysis, Routine w reflex microscopic        Follow up plan: Return in about 1 week (around 05/22/2022) for with me or PCP.

## 2022-05-18 LAB — COMPREHENSIVE METABOLIC PANEL
ALT: 36 IU/L — ABNORMAL HIGH (ref 0–32)
AST: 30 IU/L (ref 0–40)
Albumin/Globulin Ratio: 1.7 (ref 1.2–2.2)
Albumin: 4.3 g/dL (ref 3.8–4.8)
Alkaline Phosphatase: 63 IU/L (ref 44–121)
BUN/Creatinine Ratio: 33 — ABNORMAL HIGH (ref 12–28)
BUN: 47 mg/dL — ABNORMAL HIGH (ref 8–27)
Bilirubin Total: 0.6 mg/dL (ref 0.0–1.2)
CO2: 24 mmol/L (ref 20–29)
Calcium: 8.2 mg/dL — ABNORMAL LOW (ref 8.7–10.3)
Chloride: 92 mmol/L — ABNORMAL LOW (ref 96–106)
Creatinine, Ser: 1.42 mg/dL — ABNORMAL HIGH (ref 0.57–1.00)
Globulin, Total: 2.6 g/dL (ref 1.5–4.5)
Glucose: 115 mg/dL — ABNORMAL HIGH (ref 70–99)
Potassium: 3.7 mmol/L (ref 3.5–5.2)
Sodium: 136 mmol/L (ref 134–144)
Total Protein: 6.9 g/dL (ref 6.0–8.5)
eGFR: 39 mL/min/{1.73_m2} — ABNORMAL LOW (ref >59)

## 2022-05-18 LAB — CBC WITH DIFFERENTIAL/PLATELET
Basophils Absolute: 0 10*3/uL (ref 0.0–0.2)
Basos: 0 %
EOS (ABSOLUTE): 0 10*3/uL (ref 0.0–0.4)
Eos: 0 %
Hematocrit: 43.1 % (ref 34.0–46.6)
Hemoglobin: 14.4 g/dL (ref 11.1–15.9)
Immature Grans (Abs): 0.1 10*3/uL (ref 0.0–0.1)
Immature Granulocytes: 1 %
Lymphocytes Absolute: 0.8 10*3/uL (ref 0.7–3.1)
Lymphs: 7 %
MCH: 30.3 pg (ref 26.6–33.0)
MCHC: 33.4 g/dL (ref 31.5–35.7)
MCV: 91 fL (ref 79–97)
Monocytes Absolute: 1.3 10*3/uL — ABNORMAL HIGH (ref 0.1–0.9)
Monocytes: 12 %
Neutrophils Absolute: 8.8 10*3/uL — ABNORMAL HIGH (ref 1.4–7.0)
Neutrophils: 80 %
Platelets: 223 10*3/uL (ref 150–450)
RBC: 4.76 x10E6/uL (ref 3.77–5.28)
RDW: 13.2 % (ref 11.7–15.4)
WBC: 11 10*3/uL — ABNORMAL HIGH (ref 3.4–10.8)

## 2022-05-18 LAB — MICROSCOPIC EXAMINATION
Casts: NONE SEEN /lpf
Epithelial Cells (non renal): >10 /hpf — AB (ref 0–10)
WBC, UA: >30 /hpf — AB (ref 0–5)

## 2022-05-18 LAB — URINALYSIS, ROUTINE W REFLEX MICROSCOPIC
Bilirubin, UA: NEGATIVE
Glucose, UA: NEGATIVE
Ketones, UA: NEGATIVE
Nitrite, UA: NEGATIVE
Protein,UA: NEGATIVE
RBC, UA: NEGATIVE
Specific Gravity, UA: 1.017 (ref 1.005–1.030)
Urobilinogen, Ur: 0.2 mg/dL (ref 0.2–1.0)
pH, UA: 5.5 (ref 5.0–7.5)

## 2022-05-18 LAB — LIPASE: Lipase: 139 U/L — ABNORMAL HIGH (ref 14–85)

## 2022-05-18 LAB — AMYLASE: Amylase: 113 U/L — ABNORMAL HIGH (ref 31–110)

## 2022-05-20 NOTE — Patient Instructions (Signed)
Diarrhea, Adult Diarrhea is when you pass loose and sometimes watery poop (stool) often. Diarrhea can make you feel weak and cause you to lose water in your body (get dehydrated). Losing water in your body can cause you to: Feel tired and thirsty. Have a dry mouth. Go pee (urinate) less often. Diarrhea often lasts 2-3 days. It can last longer if it is a sign of something more serious. Be sure to treat your diarrhea as told by your doctor. Follow these instructions at home: Eating and drinking     Follow these instructions as told by your doctor: Take an ORS (oral rehydration solution). This is a drink that helps you replace fluids and minerals your body lost. It is sold at pharmacies and stores. Drink enough fluid to keep your pee (urine) pale yellow. Drink fluids such as: Water. You can also get fluids by sucking on ice chips. Diluted fruit juice. Low-calorie sports drinks. Milk. Avoid drinking fluids that have a lot of sugar or caffeine in them. These include soda, energy drinks, and regular sports drinks. Avoid alcohol. Eat bland, easy-to-digest foods in small amounts as you are able. These foods include: Bananas. Applesauce. Rice. Low-fat (lean) meats. Toast. Crackers. Avoid spicy or fatty foods.  Medicines Take over-the-counter and prescription medicines only as told by your doctor. If you were prescribed antibiotics, take them as told by your doctor. Do not stop taking them even if you start to feel better. General instructions  Wash your hands often using soap and water for 20 seconds. If soap and water are not available, use hand sanitizer. Others in your home should wash their hands as well. Wash your hands: After using the toilet or changing a diaper. Before preparing, cooking, or serving food. While caring for a sick person. While visiting someone in a hospital. Rest at home while you get better. Take a warm bath to help with any burning or pain from having  diarrhea. Watch your condition for any changes. Contact a doctor if: You have a fever. Your diarrhea gets worse. You have new symptoms. You vomit every time you eat or drink. You feel light-headed, dizzy, or you have a headache. You have muscle cramps. You have signs of losing too much water in your body, such as: Dark pee, very little pee, or no pee. Cracked lips. Dry mouth. Sunken eyes. Sleepiness. Weakness. You have bloody or black poop or poop that looks like tar. You have very bad pain, cramping, or bloating in your belly (abdomen). Your skin feels cold and clammy. You feel confused. Get help right away if: You have chest pain. Your heart is beating very quickly. You have trouble breathing or you are breathing very quickly. You feel very weak or you faint. These symptoms may be an emergency. Get help right away. Call 911. Do not wait to see if the symptoms will go away. Do not drive yourself to the hospital. This information is not intended to replace advice given to you by your health care provider. Make sure you discuss any questions you have with your health care provider. Document Revised: 09/12/2021 Document Reviewed: 09/12/2021 Else[employer] Patient Education  [place].

## 2022-05-24 ENCOUNTER — Encounter: Payer: Self-pay | Admitting: Nurse Practitioner

## 2022-05-24 ENCOUNTER — Ambulatory Visit (INDEPENDENT_AMBULATORY_CARE_PROVIDER_SITE_OTHER): Payer: Medicare Other | Admitting: Nurse Practitioner

## 2022-05-24 VITALS — BP 140/82 | HR 77 | Temp 98.4°F | Ht 67.01 in | Wt 231.5 lb

## 2022-05-24 DIAGNOSIS — G4733 Obstructive sleep apnea (adult) (pediatric): Secondary | ICD-10-CM | POA: Diagnosis not present

## 2022-05-24 DIAGNOSIS — Z1211 Encounter for screening for malignant neoplasm of colon: Secondary | ICD-10-CM | POA: Diagnosis not present

## 2022-05-24 DIAGNOSIS — K219 Gastro-esophageal reflux disease without esophagitis: Secondary | ICD-10-CM

## 2022-05-24 DIAGNOSIS — I10 Essential (primary) hypertension: Secondary | ICD-10-CM

## 2022-05-24 DIAGNOSIS — E782 Mixed hyperlipidemia: Secondary | ICD-10-CM

## 2022-05-24 DIAGNOSIS — Z853 Personal history of malignant neoplasm of breast: Secondary | ICD-10-CM | POA: Diagnosis not present

## 2022-05-24 DIAGNOSIS — J4489 Other specified chronic obstructive pulmonary disease: Secondary | ICD-10-CM

## 2022-05-24 DIAGNOSIS — R7301 Impaired fasting glucose: Secondary | ICD-10-CM

## 2022-05-24 DIAGNOSIS — M8588 Other specified disorders of bone density and structure, other site: Secondary | ICD-10-CM | POA: Diagnosis not present

## 2022-05-24 DIAGNOSIS — D1430 Benign neoplasm of unspecified bronchus and lung: Secondary | ICD-10-CM

## 2022-05-24 DIAGNOSIS — I519 Heart disease, unspecified: Secondary | ICD-10-CM

## 2022-05-24 DIAGNOSIS — Z6835 Body mass index (BMI) 35.0-35.9, adult: Secondary | ICD-10-CM

## 2022-05-24 MED ORDER — AMLODIPINE BESYLATE 10 MG PO TABS: 10.0000 mg | ORAL_TABLET | Freq: Every day | ORAL | 12 refills | Status: DC

## 2022-05-24 MED ORDER — PANTOPRAZOLE SODIUM 40 MG PO TBEC: 40.0000 mg | DELAYED_RELEASE_TABLET | Freq: Two times a day (BID) | ORAL | 4 refills | Status: DC

## 2022-05-24 MED ORDER — METOPROLOL SUCCINATE ER 25 MG PO TB24: 25.0000 mg | ORAL_TABLET | Freq: Every day | ORAL | 4 refills | Status: DC

## 2022-05-24 MED ORDER — VALSARTAN 160 MG PO TABS: 160.0000 mg | ORAL_TABLET | Freq: Every day | ORAL | 4 refills | Status: DC

## 2022-05-24 NOTE — Assessment & Plan Note (Signed)
Chronic, stable with Protonix.  Continue current medication regimen.  Mag level today and annually.  Trial time off in future and assess if tolerated. Risks of PPI use were discussed with patient including bone loss, C. Diff diarrhea, pneumonia, infections, CKD, electrolyte abnormalities. Verbalizes understanding and chooses to continue the medication.

## 2022-05-24 NOTE — Assessment & Plan Note (Signed)
Chronic, ongoing. Above goal on check today.  Will increase Amlodipine to 10 MG daily, continue remainder of medication regimen as ordered.  Refills sent.  Continue to monitor BP at home regularly and document + focus on DASH diet.  LABS: CBC, CMP, TSH, urine ALB today.  Return in 4 weeks.

## 2022-05-24 NOTE — Assessment & Plan Note (Signed)
Chronic, ongoing.  Again reviewed ASCVD score with patient and discussed risks, she refuses statin, does not wish to take.  Continue red yeast rice and obtain labs today.  Return in 6 months.

## 2022-05-24 NOTE — Addendum Note (Signed)
Addended by: Marnee Guarneri T on: 05/24/2022 10:07 AM   Modules accepted: Orders

## 2022-05-24 NOTE — Assessment & Plan Note (Signed)
Chronic, ongoing.  Recommend she use her CPAP 100% of the time, currently about 80-90%.

## 2022-05-24 NOTE — Assessment & Plan Note (Signed)
BMI 36.25.  Recommended eating smaller high protein, low fat meals more frequently and exercising 30 mins a day 5 times a week with a goal of 10-15lb weight loss in the next 3 months. Patient voiced their understanding and motivation to adhere to these recommendations.

## 2022-05-24 NOTE — Assessment & Plan Note (Signed)
Noted on echo, continue collaboration with cardiology and current medication regimen.

## 2022-05-24 NOTE — Assessment & Plan Note (Signed)
Ongoing, continue collaboration with oncology.  Recent notes reviewed.

## 2022-05-24 NOTE — Assessment & Plan Note (Signed)
A1C today for recheck == A1c last check 5.5%.  Continue diet focus at home.

## 2022-05-24 NOTE — Assessment & Plan Note (Signed)
Chronic, stable with Trelegy.  Continue collaboration with pulmonary and current medications as prescribed by them.  Recent note reviewed.

## 2022-05-24 NOTE — Assessment & Plan Note (Signed)
Chronic.  Noted on DEXA 08/04/19 -- continue Vitamin D daily + Prolia and recommend repeat DEXA in 5 years.  Continue to collaborate with oncology, obtains Prolia with them.

## 2022-05-24 NOTE — Progress Notes (Signed)
BP (!) 140/82 (BP Location: Left Arm, Patient Position: Sitting, Cuff Size: Normal)   Pulse 77   Temp 98.4 F (36.9 C) (Oral)   Ht 5' 7.01" (1.702 m)   Wt 231 lb 8 oz (105 kg)   SpO2 95%   BMI 36.25 kg/m    Subjective:    Patient ID: Melissa Merritt, female    DOB: [birth year], 72 y.o.   MRN: [id number]  HPI: Melissa Merritt is a 72 y.o. female presenting on 05/24/2022 for annual exam. Current medical complaints include:none  She currently lives with: husband Menopausal Symptoms: no  Had bout of gastroenteritis last week -- overall feeling better at this time.  HYPERTENSION / HYPERLIPIDEMIA Taking Valsartan, Metoprolol, Amlodipine, Lasix PRN. Visits with Dr. Josefa Half at cardiology, last saw in his office 11/29/21.    Last echo 08/24/20 with LV function LVEF 45-50% with mild reduced left ventricular function which is consistent with previous echos, going back to 2013.   Continues to refuse statin therapy, even after at length discussion about risk reduction with PCP and cardiology.  Is taking red yeast rice.   Satisfied with current treatment? yes Duration of hypertension: chronic BP monitoring frequency: a few times a week BP range: 140/70-80 BP medication side effects: no Duration of hyperlipidemia: chronic Cholesterol medication side effects: no Cholesterol supplements: red yeast rice Medication compliance: good compliance Aspirin: yes Recent stressors: no Recurrent headaches: no Visual changes: no Palpitations: no Dyspnea: at baseline -- no changes Chest pain: no Lower extremity edema: occasional Dizzy/lightheaded: no The 10-year ASCVD risk score (Arnett DK, et al., 2019) is: 20.1%   Values used to calculate the score:     Age: 27 years     Sex: Female     Is Non-Hispanic African American: No     Diabetic: No     Tobacco smoker: No     Systolic Blood Pressure: [id number] mmHg     Is BP treated: Yes     HDL Cholesterol: 48 mg/dL     Total Cholesterol: 180 mg/dL     IFG Noted on labs, last A1c in August 5.5% Polydipsia/polyuria: no Visual disturbance: no Chest pain: no Paresthesias: no   BREAST CANCER: Followed by oncology and received radiation treatment. Finished treatments 11/16/2019, continues to be followed by oncology every 6 months, last visit 02/15/22. Taking Aromasin, currently doing well with this.  Osteopenia on DEXA followed with oncology -- she is taking Prolia via them.   ASTHMA & OSA Pulmonary last on 03/29/22 for asthma -- taking Trelegy - they tried Judithann Sauger but did not tolerate as well.  Stage 2 COPD. Completed pulmonary rehab in December 2022.  Currently has CPAP and uses about 80-90% of the time.   Asthma status: stable Satisfied with current treatment?: yes Albuterol/rescue inhaler frequency: none Dyspnea frequency: at baseline - no changes Wheezing frequency: no Cough frequency: no Nocturnal symptom frequency: no Limitation of activity: no Current upper respiratory symptoms: no Triggers: unknown Home peak flows:no Last Spirometry: with pulmonary Failed/intolerant to following asthma meds: Breztri Asthma meds in past: as on chart Aerochamber/spacer use: no Visits to ER or Urgent Care in past year: no Pneumovax: Up to Date Influenza: Up to Date      05/24/2022    9:12 AM 05/15/2022    3:03 PM 11/22/2021    8:21 AM 06/01/2021   10:09 AM 05/25/2021   10:02 AM  Depression screen PHQ 2/9  Decreased Interest 0 0 0 0 0  Down, Depressed,  Hopeless 0 0 0 0 0  PHQ - 2 Score 0 0 0 0 0  Altered sleeping 2 2 3 2 2  $ Tired, decreased energy 1 1 1 1 1  $ Change in appetite 0 0 1 0 0  Feeling bad or failure about yourself  0 0 0 0 0  Trouble concentrating 0 0 0 0 0  Moving slowly or fidgety/restless 0 0 0 0 0  Suicidal thoughts 0 0 0 0 0  PHQ-9 Score 3 3 5 3 3  $ Difficult doing work/chores  Not difficult at all Somewhat difficult Not difficult at all Not difficult at all       02/15/2022   10:18 AM 05/15/2022    3:03 PM 05/15/2022     3:04 PM 05/24/2022    9:12 AM 05/24/2022    9:24 AM  Fall Risk  Falls in the past year?  0 0 0 0  Was there an injury with Fall?  0 0 0 0  Fall Risk Category Calculator  0 0 0 0  (RETIRED) Patient Fall Risk Level Low fall risk      Patient at Risk for Falls Due to  No Fall Risks No Fall Risks No Fall Risks No Fall Risks  Fall risk Follow up  Falls evaluation completed Falls evaluation completed Falls evaluation completed Education provided    Functional Status Survey: Is the patient deaf or have difficulty hearing?: No Does the patient have difficulty seeing, even when wearing glasses/contacts?: No Does the patient have difficulty concentrating, remembering, or making decisions?: No Does the patient have difficulty walking or climbing stairs?: No Does the patient have difficulty dressing or bathing?: No Does the patient have difficulty doing errands alone such as visiting a doctor's office or shopping?: No    Past Medical History:  Past Medical History:  Diagnosis Date   Benign tumor of lung    Breast cancer ([place])    Cancer ([place])    right breast   CHF (congestive heart failure) ([place])    Dyspnea    GERD (gastroesophageal reflux disease)    Hyperlipidemia    Hypertension    Insomnia    Osteopenia    Personal history of radiation therapy    Plantar fasciitis    PONV (postoperative nausea and vomiting)    vomiting 2 days after lung surgery   S/P pneumonectomy    right lower lobe   Urinary incontinence     Surgical History:  Past Surgical History:  Procedure Laterality Date   BREAST BIOPSY Right 07/17/2019   Affirm bx-"Ribbon" clip-[facility] & [facility]   BREAST BIOPSY Right 07/31/2021   Atereo bx, x-clip, path pending   BREAST LUMPECTOMY Right 2021   [facility], [facility], 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   bronchoscopy     CATARACT EXTRACTION, BILATERAL     Left- 11/21/15, right- 12/23/15   CHOLECYSTECTOMY     CYSTECTOMY  11/03/2009   cheek   dilation and curretage     EYE SURGERY Bilateral     cataract   FOOT SURGERY Bilateral 2010   LOBECTOMY     LUNG SURGERY Right    PART MASTECTOMY,RADIO FREQUENCY LOCALIZER,AXILLARY SENTINEL NODE BIOPSY Right 08/13/2019   Procedure: PART MASTECTOMY,RADIO FREQUENCY LOCALIZER,AXILLARY SENTINEL NODE BIOPSY;  Surgeon: Benjamine Sprague, DO;  Location: [facility] ORS;  Service: General;  Laterality: Right;   UPPER GI ENDOSCOPY  2005    Medications:  Current Outpatient Medications on File Prior to Visit  Medication Sig   Biotin 5000 MCG  TABS Take 5,000 mcg by mouth 2 (two) times daily.   Calcium Carb-Cholecalciferol (CALCIUM-VITAMIN D) 600-400 MG-UNIT TABS Take 1 tablet by mouth 2 (two) times daily.   cholecalciferol (VITAMIN D3) 25 MCG (1000 UNIT) tablet Take 1,000 Units by mouth in the morning and at bedtime.   Cyanocobalamin (B-12) 2500 MCG TABS Take 2,500 mcg by mouth daily.   denosumab (PROLIA) 60 MG/ML SOSY injection Inject 60 mg into the skin every 6 (six) months.   exemestane (AROMASIN) 25 MG tablet Take 1 tablet (25 mg total) by mouth daily after breakfast.   ferrous sulfate 220 (44 Fe) MG/5ML solution Take 220 mg by mouth daily.   furosemide (LASIX) 20 MG tablet Take by mouth.   Red Yeast Rice Extract (RED YEAST RICE PO) Take 1 tablet by mouth in the morning and at bedtime.   St Johns Wort 300 MG CAPS Take 300 mg by mouth 2 (two) times daily.   TRELEGY ELLIPTA 100-62.5-25 MCG/INH AEPB Inhale 1 puff into the lungs daily.   UPNEEQ 0.1 % SOLN Apply to eye.   No current facility-administered medications on file prior to visit.    Allergies:  Allergies  Allergen Reactions   Contrast Media [Iodinated Contrast Media] Swelling    Eye swelling shut   Other Swelling    Eye swelling shut IV contrast media    Ace Inhibitors Other (See Comments)    "Makes me feel strange."   Hctz [Hydrochlorothiazide] Other (See Comments)    arms ache   Keflex [Cephalexin] Other (See Comments)    thrush    Social History:  Social History   Socioeconomic  History   Marital status: Married    Spouse name: Not on file   Number of children: Not on file   Years of education: Not on file   Highest education level: Some college, no degree  Occupational History   Occupation: retired   Tobacco Use   Smoking status: Former    Types: Cigarettes    Quit date: 07/09/1979    Years since quitting: 42.9   Smokeless tobacco: Never  Vaping Use   Vaping Use: Never used  Substance and Sexual Activity   Alcohol use: No   Drug use: No   Sexual activity: Yes  Other Topics Concern   Not on file  Social History Narrative   Not on file   Social Determinants of Health   Financial Resource Strain: Low Risk  (06/01/2021)   Overall Financial Resource Strain (CARDIA)    Difficulty of Paying Living Expenses: Not hard at all  Food Insecurity: No Food Insecurity (06/01/2021)   Hunger Vital Sign    Worried About Running Out of Food in the Last Year: Never true    [place] in the Last Year: Never true  Transportation Needs: No Transportation Needs (06/01/2021)   PRAPARE - Transportation    Lack of Transportation (Medical): No    Lack of Transportation (Non-Medical): No  Physical Activity: Inactive (06/01/2021)   Exercise Vital Sign    Days of Exercise per Week: 0 days    Minutes of Exercise per Session: 0 min  Stress: No Stress Concern Present (06/01/2021)   [place]    Feeling of Stress : Not at all  Social Connections: Moderately Integrated (06/01/2021)   Social Connection and Isolation Panel [NHANES]    Frequency of Communication with Friends and Family: More than three times a week    Frequency  of Social Gatherings with Friends and Family: More than three times a week    Attends Religious Services: More than 4 times per year    Active Member of [employer] or Organizations: No    Attends Music therapist: Not on file    Marital Status: Married  Human resources officer Violence:  Not At Risk (06/01/2021)   Humiliation, Afraid, Rape, and Kick questionnaire    Fear of Current or Ex-Partner: No    Emotionally Abused: No    Physically Abused: No    Sexually Abused: No   Social History   Tobacco Use  Smoking Status Former   Types: Cigarettes   Quit date: 07/09/1979   Years since quitting: 42.9  Smokeless Tobacco Never   Social History   Substance and Sexual Activity  Alcohol Use No    Family History:  Family History  Problem Relation Age of Onset   COPD Mother    Hypertension Mother    Emphysema Mother    Osteoporosis Mother    Heart disease Father    Hyperlipidemia Sister    Hyperlipidemia Brother    Heart disease Maternal Grandmother    Heart disease Maternal Grandfather    Heart disease Paternal Grandmother    Stroke Paternal Grandmother    Heart disease Paternal Grandfather    Stroke Paternal Grandfather    Cancer Maternal Aunt        breast and lung   Osteoporosis Maternal Aunt    Aortic aneurysm Maternal Aunt    Breast cancer Maternal Aunt 76    Past medical history, surgical history, medications, allergies, family history and social history reviewed with patient today and changes made to appropriate areas of the chart.   ROS All other ROS negative except what is listed above and in the HPI.      Objective:    BP (!) 140/82 (BP Location: Left Arm, Patient Position: Sitting, Cuff Size: Normal)   Pulse 77   Temp 98.4 F (36.9 C) (Oral)   Ht 5' 7.01" (1.702 m)   Wt 231 lb 8 oz (105 kg)   SpO2 95%   BMI 36.25 kg/m   Wt Readings from Last 3 Encounters:  05/24/22 231 lb 8 oz (105 kg)  05/15/22 222 lb 11.2 oz (101 kg)  02/15/22 233 lb (105.7 kg)    Physical Exam Vitals and nursing note reviewed. Exam conducted with a chaperone present.  Constitutional:      General: She is awake. She is not in acute distress.    Appearance: She is well-developed and well-groomed. She is obese. She is not ill-appearing or toxic-appearing.  HENT:      Head: Normocephalic and atraumatic.     Right Ear: Hearing, tympanic membrane, ear canal and external ear normal. No drainage.     Left Ear: Hearing, tympanic membrane, ear canal and external ear normal. No drainage.     Nose: Nose normal.     Right Sinus: No maxillary sinus tenderness or frontal sinus tenderness.     Left Sinus: No maxillary sinus tenderness or frontal sinus tenderness.     Mouth/Throat:     Mouth: Mucous membranes are moist.     Pharynx: Oropharynx is clear. Uvula midline. No pharyngeal swelling, oropharyngeal exudate or posterior oropharyngeal erythema.  Eyes:     General: Lids are normal.        Right eye: No discharge.        Left eye: No discharge.     Extraocular  Movements: Extraocular movements intact.     Conjunctiva/sclera: Conjunctivae normal.     Pupils: Pupils are equal, round, and reactive to light.     Visual Fields: Right eye visual fields normal and left eye visual fields normal.  Neck:     Thyroid: No thyromegaly.     Vascular: No carotid bruit.     Trachea: Trachea normal.  Cardiovascular:     Rate and Rhythm: Normal rate and regular rhythm.     Heart sounds: Normal heart sounds. No murmur heard.    No gallop.  Pulmonary:     Effort: Pulmonary effort is normal. No accessory muscle usage or respiratory distress.     Breath sounds: Normal breath sounds.  Chest:  Breasts:    Right: Normal.     Left: Normal.  Abdominal:     General: Bowel sounds are normal.     Palpations: Abdomen is soft. There is no hepatomegaly or splenomegaly.     Tenderness: There is no abdominal tenderness.  Musculoskeletal:        General: Normal range of motion.     Cervical back: Normal range of motion and neck supple.     Right lower leg: No edema.     Left lower leg: No edema.  Lymphadenopathy:     Head:     Right side of head: No submental, submandibular, tonsillar, preauricular or posterior auricular adenopathy.     Left side of head: No submental,  submandibular, tonsillar, preauricular or posterior auricular adenopathy.     Cervical: No cervical adenopathy.     Upper Body:     Right upper body: No supraclavicular, axillary or pectoral adenopathy.     Left upper body: No supraclavicular, axillary or pectoral adenopathy.  Skin:    General: Skin is warm and dry.     Capillary Refill: Capillary refill takes less than 2 seconds.     Findings: No rash.  Neurological:     Mental Status: She is alert and oriented to person, place, and time.     Gait: Gait is intact.     Deep Tendon Reflexes: Reflexes are normal and symmetric.     Reflex Scores:      Brachioradialis reflexes are 2+ on the right side and 2+ on the left side.      Patellar reflexes are 2+ on the right side and 2+ on the left side. Psychiatric:        Attention and Perception: Attention normal.        Mood and Affect: Mood normal.        Speech: Speech normal.        Behavior: Behavior normal. Behavior is cooperative.        Thought Content: Thought content normal.        Judgment: Judgment normal.    Results for orders placed or performed in visit on 05/15/22  Microscopic Examination   BLD  Result Value Ref Range   WBC, UA >30 (A) 0 - 5 /hpf   RBC, Urine 0-2 0 - 2 /hpf   Epithelial Cells (non renal) >10 (A) 0 - 10 /hpf   Casts None seen None seen /lpf   Crystals Present (A) N/A   Crystal Type Uric Acid N/A   Bacteria, UA Many (A) None seen/Few  Comprehensive metabolic panel  Result Value Ref Range   Glucose 115 (H) 70 - 99 mg/dL   BUN 47 (H) 8 - 27 mg/dL   Creatinine, Ser 1.42 (H)  0.57 - 1.00 mg/dL   eGFR 39 (L) >59 mL/min/1.73   BUN/Creatinine Ratio 33 (H) 12 - 28   Sodium 136 134 - 144 mmol/L   Potassium 3.7 3.5 - 5.2 mmol/L   Chloride 92 (L) 96 - 106 mmol/L   CO2 24 20 - 29 mmol/L   Calcium 8.2 (L) 8.7 - 10.3 mg/dL   Total Protein 6.9 6.0 - 8.5 g/dL   Albumin 4.3 3.8 - 4.8 g/dL   Globulin, Total 2.6 1.5 - 4.5 g/dL   Albumin/Globulin Ratio 1.7 1.2 -  2.2   Bilirubin Total 0.6 0.0 - 1.2 mg/dL   Alkaline Phosphatase 63 44 - 121 IU/L   AST 30 0 - 40 IU/L   ALT 36 (H) 0 - 32 IU/L  Lipase  Result Value Ref Range   Lipase 139 (H) 14 - 85 U/L  Amylase  Result Value Ref Range   Amylase 113 (H) 31 - 110 U/L  CBC with Differential/Platelet  Result Value Ref Range   WBC 11.0 (H) 3.4 - 10.8 x10E3/uL   RBC 4.76 3.77 - 5.28 x10E6/uL   Hemoglobin 14.4 11.1 - 15.9 g/dL   Hematocrit 43.1 34.0 - 46.6 %   MCV 91 79 - 97 fL   MCH 30.3 26.6 - 33.0 pg   MCHC 33.4 31.5 - 35.7 g/dL   RDW 13.2 11.7 - 15.4 %   Platelets 223 150 - 450 x10E3/uL   Neutrophils 80 Not Estab. %   Lymphs 7 Not Estab. %   Monocytes 12 Not Estab. %   Eos 0 Not Estab. %   Basos 0 Not Estab. %   Neutrophils Absolute 8.8 (H) 1.4 - 7.0 x10E3/uL   Lymphocytes Absolute 0.8 0.7 - 3.1 x10E3/uL   Monocytes Absolute 1.3 (H) 0.1 - 0.9 x10E3/uL   EOS (ABSOLUTE) 0.0 0.0 - 0.4 x10E3/uL   Basophils Absolute 0.0 0.0 - 0.2 x10E3/uL   Immature Granulocytes 1 Not Estab. %   Immature Grans (Abs) 0.1 0.0 - 0.1 x10E3/uL  Urinalysis, Routine w reflex microscopic  Result Value Ref Range   Specific Gravity, UA 1.017 1.005 - 1.030   pH, UA 5.5 5.0 - 7.5   Color, UA Yellow Yellow   Appearance Ur Turbid (A) Clear   Leukocytes,UA 2+ (A) Negative   Protein,UA Negative Negative/Trace   Glucose, UA Negative Negative   Ketones, UA Negative Negative   RBC, UA Negative Negative   Bilirubin, UA Negative Negative   Urobilinogen, Ur 0.2 0.2 - 1.0 mg/dL   Nitrite, UA Negative Negative   Microscopic Examination See below:       Assessment & Plan:   Problem List Items Addressed This Visit       Cardiovascular and Mediastinum   Essential hypertension    Chronic, ongoing. Above goal on check today.  Will increase Amlodipine to 10 MG daily, continue remainder of medication regimen as ordered.  Refills sent.  Continue to monitor BP at home regularly and document + focus on DASH diet.  LABS: CBC, CMP,  TSH, urine ALB today.  Return in 4 weeks.      Relevant Medications   amLODipine (NORVASC) 10 MG tablet   valsartan (DIOVAN) 160 MG tablet   metoprolol succinate (TOPROL-XL) 25 MG 24 hr tablet   Other Relevant Orders   CBC with Differential/Platelet   Comprehensive metabolic panel   TSH   Urine Microalbumin w/creat. ratio   Left ventricular dysfunction    Noted on echo, continue collaboration with cardiology  and current medication regimen.      Relevant Medications   amLODipine (NORVASC) 10 MG tablet   valsartan (DIOVAN) 160 MG tablet   metoprolol succinate (TOPROL-XL) 25 MG 24 hr tablet     Respiratory   Asthma-COPD overlap syndrome    Chronic, stable with Trelegy.  Continue collaboration with pulmonary and current medications as prescribed by them.  Recent note reviewed.      Obstructive apnea    Chronic, ongoing.  Recommend she use her CPAP 100% of the time, currently about 80-90%.        Digestive   GERD (gastroesophageal reflux disease)    Chronic, stable with Protonix.  Continue current medication regimen.  Mag level today and annually.  Trial time off in future and assess if tolerated. Risks of PPI use were discussed with patient including bone loss, C. Diff diarrhea, pneumonia, infections, CKD, electrolyte abnormalities. Verbalizes understanding and chooses to continue the medication.       Relevant Medications   pantoprazole (PROTONIX) 40 MG tablet   Other Relevant Orders   Magnesium     Endocrine   IFG (impaired fasting glucose)    A1C today for recheck == A1c last check 5.5%.  Continue diet focus at home.      Relevant Orders   HgB A1c   Urine Microalbumin w/creat. ratio     Musculoskeletal and Integument   Osteopenia of lumbar spine    Chronic.  Noted on DEXA 08/04/19 -- continue Vitamin D daily + Prolia and recommend repeat DEXA in 5 years.  Continue to collaborate with oncology, obtains Prolia with them.      Relevant Orders   VITAMIN D 25 Hydroxy  (Vit-D Deficiency, Fractures)     Other   History of breast cancer    Ongoing, continue collaboration with oncology.  Recent notes reviewed.      Hyperlipidemia    Chronic, ongoing.  Again reviewed ASCVD score with patient and discussed risks, she refuses statin, does not wish to take.  Continue red yeast rice and obtain labs today.  Return in 6 months.      Relevant Medications   amLODipine (NORVASC) 10 MG tablet   valsartan (DIOVAN) 160 MG tablet   metoprolol succinate (TOPROL-XL) 25 MG 24 hr tablet   Other Relevant Orders   Comprehensive metabolic panel   Lipid Panel w/o Chol/HDL Ratio   Obesity - Primary    BMI 36.25.  Recommended eating smaller high protein, low fat meals more frequently and exercising 30 mins a day 5 times a week with a goal of 10-15lb weight loss in the next 3 months. Patient voiced their understanding and motivation to adhere to these recommendations.       Other Visit Diagnoses     Colon cancer screening       Referral sent to GI.   Relevant Orders   Ambulatory referral to Gastroenterology        Follow up plan: Return in about 4 weeks (around 06/21/2022) for HTN -- increased Amlodipine to 10 MG.   LABORATORY TESTING:  - Pap smear: not applicable  IMMUNIZATIONS:   - Tdap: Tetanus vaccination status reviewed: last tetanus booster within 10 years. - Influenza: Up to date - Pneumovax: Up to date - Prevnar: Up to date - COVID: Up to date - HPV: Not applicable - Shingrix vaccine: Refused  SCREENING: -Mammogram: Up to date  - Colonoscopy: Ordered today  - Bone Density: Up to date  -Hearing Test: Not applicable  -  Spirometry: Up to date   PATIENT COUNSELING:   Advised to take 1 mg of folate supplement per day if capable of pregnancy.   Sexuality: Discussed sexually transmitted diseases, partner selection, use of condoms, avoidance of unintended pregnancy  and contraceptive alternatives.   Advised to avoid cigarette smoking.  I discussed  with the patient that most people either abstain from alcohol or drink within safe limits (<=14/week and <=4 drinks/occasion for males, <=7/weeks and <= 3 drinks/occasion for females) and that the risk for alcohol disorders and other health effects rises proportionally with the number of drinks per week and how often a drinker exceeds daily limits.  Discussed cessation/primary prevention of drug use and availability of treatment for abuse.   Diet: Encouraged to adjust caloric intake to maintain  or achieve ideal body weight, to reduce intake of dietary saturated fat and total fat, to limit sodium intake by avoiding high sodium foods and not adding table salt, and to maintain adequate dietary potassium and calcium preferably from fresh fruits, vegetables, and low-fat dairy products.    Stressed the importance of regular exercise  Injury prevention: Discussed safety belts, safety helmets, smoke detector, smoking near bedding or upholstery.   Dental health: Discussed importance of regular tooth brushing, flossing, and dental visits.    NEXT PREVENTATIVE PHYSICAL DUE IN 1 YEAR. Return in about 4 weeks (around 06/21/2022) for HTN -- increased Amlodipine to 10 MG.

## 2022-05-25 LAB — CBC WITH DIFFERENTIAL/PLATELET
Basophils Absolute: 0.1 10*3/uL (ref 0.0–0.2)
Basos: 1 %
EOS (ABSOLUTE): 0.4 10*3/uL (ref 0.0–0.4)
Eos: 4 %
Hematocrit: 38.9 % (ref 34.0–46.6)
Hemoglobin: 13.1 g/dL (ref 11.1–15.9)
Immature Grans (Abs): 0.4 10*3/uL — ABNORMAL HIGH (ref 0.0–0.1)
Immature Granulocytes: 5 %
Lymphocytes Absolute: 1.2 10*3/uL (ref 0.7–3.1)
Lymphs: 15 %
MCH: 29.9 pg (ref 26.6–33.0)
MCHC: 33.7 g/dL (ref 31.5–35.7)
MCV: 89 fL (ref 79–97)
Monocytes Absolute: 0.8 10*3/uL (ref 0.1–0.9)
Monocytes: 9 %
Neutrophils Absolute: 5.4 10*3/uL (ref 1.4–7.0)
Neutrophils: 66 %
Platelets: 274 10*3/uL (ref 150–450)
RBC: 4.38 x10E6/uL (ref 3.77–5.28)
RDW: 12.7 % (ref 11.7–15.4)
WBC: 8.3 10*3/uL (ref 3.4–10.8)

## 2022-05-25 LAB — COMPREHENSIVE METABOLIC PANEL
ALT: 25 IU/L (ref 0–32)
AST: 19 IU/L (ref 0–40)
Albumin/Globulin Ratio: 1.9 (ref 1.2–2.2)
Albumin: 4 g/dL (ref 3.8–4.8)
Alkaline Phosphatase: 87 IU/L (ref 44–121)
BUN/Creatinine Ratio: 18 (ref 12–28)
BUN: 15 mg/dL (ref 8–27)
Bilirubin Total: 0.2 mg/dL (ref 0.0–1.2)
CO2: 23 mmol/L (ref 20–29)
Calcium: 10 mg/dL (ref 8.7–10.3)
Chloride: 101 mmol/L (ref 96–106)
Creatinine, Ser: 0.85 mg/dL (ref 0.57–1.00)
Globulin, Total: 2.1 g/dL (ref 1.5–4.5)
Glucose: 107 mg/dL — ABNORMAL HIGH (ref 70–99)
Potassium: 3.6 mmol/L (ref 3.5–5.2)
Sodium: 140 mmol/L (ref 134–144)
Total Protein: 6.1 g/dL (ref 6.0–8.5)
eGFR: 72 mL/min/{1.73_m2} (ref >59)

## 2022-05-25 LAB — MICROALBUMIN / CREATININE URINE RATIO
Creatinine, Urine: 125.2 mg/dL
Microalb/Creat Ratio: 10 mg/g creat (ref 0–29)
Microalbumin, Urine: 13 ug/mL

## 2022-05-25 LAB — VITAMIN D 25 HYDROXY (VIT D DEFICIENCY, FRACTURES): Vit D, 25-Hydroxy: 61.8 ng/mL (ref 30.0–100.0)

## 2022-05-25 LAB — LIPID PANEL W/O CHOL/HDL RATIO
Cholesterol, Total: 159 mg/dL (ref 100–199)
HDL: 43 mg/dL (ref >39)
LDL Chol Calc (NIH): 86 mg/dL (ref 0–99)
Triglycerides: 176 mg/dL — ABNORMAL HIGH (ref 0–149)
VLDL Cholesterol Cal: 30 mg/dL (ref 5–40)

## 2022-05-25 LAB — HEMOGLOBIN A1C
Est. average glucose Bld gHb Est-mCnc: 123 mg/dL
Hgb A1c MFr Bld: 5.9 % — ABNORMAL HIGH (ref 4.8–5.6)

## 2022-05-25 LAB — TSH: TSH: 1.27 u[IU]/mL (ref 0.450–4.500)

## 2022-05-25 LAB — MAGNESIUM: Magnesium: 1.8 mg/dL (ref 1.6–2.3)

## 2022-05-25 NOTE — Progress Notes (Signed)
Contacted via MyChart   Good evening Melissa Merritt, your labs have returned: - CBC shows no infection or anemia - Kidney function, creatinine and eGFR, remains normal, as is liver function, AST and ALT.  - Cholesterol labs look much better this check with LDL 86 - continue your supplements. - A1c 5.9%, continues in prediabetic range, highly recommend heavy focus on diet and regular exercise. - Remainder of labs are all stable.  No medication changes needed.  Any questions? Keep being amazing!!  Thank you for allowing me to participate in your care.  I appreciate you. Kindest regards, Suzetta Timko

## 2022-05-28 ENCOUNTER — Telehealth: Payer: Self-pay | Admitting: Nurse Practitioner

## 2022-05-28 ENCOUNTER — Encounter: Payer: Self-pay | Admitting: Internal Medicine

## 2022-05-28 ENCOUNTER — Ambulatory Visit: Payer: Medicare Other | Admitting: Nurse Practitioner

## 2022-05-28 ENCOUNTER — Ambulatory Visit
Admission: RE | Admit: 2022-05-28 | Discharge: 2022-05-28 | Disposition: A | Discharge status: Home / Self Care | Payer: Medicare Other | Source: Home / Self Care | Attending: Nurse Practitioner | Admitting: Nurse Practitioner

## 2022-05-28 ENCOUNTER — Ambulatory Visit
Admission: RE | Admit: 2022-05-28 | Discharge: 2022-05-28 | Disposition: A | Discharge status: Home / Self Care | Payer: Medicare Other | Source: Ambulatory Visit | Attending: Nurse Practitioner | Admitting: Nurse Practitioner

## 2022-05-28 DIAGNOSIS — I700 Atherosclerosis of aorta: Secondary | ICD-10-CM | POA: Diagnosis not present

## 2022-05-28 DIAGNOSIS — D1430 Benign neoplasm of unspecified bronchus and lung: Secondary | ICD-10-CM | POA: Diagnosis not present

## 2022-05-28 DIAGNOSIS — R918 Other nonspecific abnormal finding of lung field: Secondary | ICD-10-CM | POA: Diagnosis not present

## 2022-05-28 NOTE — Telephone Encounter (Signed)
Contacted [name] to schedule their annual wellness visit. Appointment made for 06/12/2022.  [facility] Direct Dial: [phone]

## 2022-05-30 DIAGNOSIS — Z86010 Personal history of colonic polyps: Secondary | ICD-10-CM | POA: Diagnosis not present

## 2022-05-30 NOTE — Progress Notes (Signed)
Contacted via MyChart   Chest imaging remains stable, no changes noted.

## 2022-06-06 DIAGNOSIS — I519 Heart disease, unspecified: Secondary | ICD-10-CM | POA: Diagnosis not present

## 2022-06-06 DIAGNOSIS — J449 Chronic obstructive pulmonary disease, unspecified: Secondary | ICD-10-CM | POA: Diagnosis not present

## 2022-06-06 DIAGNOSIS — I420 Dilated cardiomyopathy: Secondary | ICD-10-CM | POA: Diagnosis not present

## 2022-06-06 DIAGNOSIS — G4733 Obstructive sleep apnea (adult) (pediatric): Secondary | ICD-10-CM | POA: Diagnosis not present

## 2022-06-06 DIAGNOSIS — R0602 Shortness of breath: Secondary | ICD-10-CM | POA: Diagnosis not present

## 2022-06-06 DIAGNOSIS — R600 Localized edema: Secondary | ICD-10-CM | POA: Diagnosis not present

## 2022-06-06 DIAGNOSIS — I10 Essential (primary) hypertension: Secondary | ICD-10-CM | POA: Diagnosis not present

## 2022-06-12 ENCOUNTER — Ambulatory Visit (INDEPENDENT_AMBULATORY_CARE_PROVIDER_SITE_OTHER): Payer: Medicare Other

## 2022-06-12 VITALS — Ht 67.0 in | Wt 231.0 lb

## 2022-06-12 DIAGNOSIS — Z0000 Encounter for general adult medical examination without abnormal findings: Secondary | ICD-10-CM

## 2022-06-12 NOTE — Patient Instructions (Signed)
Melissa Merritt , Thank you for taking time to come for your Medicare Wellness Visit. I appreciate your ongoing commitment to your health goals. Please review the following plan we discussed and let me know if I can assist you in the future.   These are the goals we discussed:  Goals      DIET - EAT MORE FRUITS AND VEGETABLES     DIET - INCREASE WATER INTAKE     Recommend drinking at least 6-8 glasses of water a day      Patient Stated     05/30/2020, wants to weigh 180 pounds     Patient Stated     Drink more water        This is a list of the screening recommended for you and due dates:  Health Maintenance  Topic Date Due   Colon Cancer Screening  10/20/2018   COVID-19 Vaccine (4 - 2023-24 season) 12/08/2021   Zoster (Shingles) Vaccine (1 of 2) 08/22/2022*   Medicare Annual Wellness Visit  06/12/2023   Mammogram  07/13/2023   DEXA scan (bone density measurement)  08/03/2024   DTaP/Tdap/Td vaccine (3 - Td or Tdap) 12/26/2025   Pneumonia Vaccine  Completed   Flu Shot  Completed   Hepatitis C Screening: USPSTF Recommendation to screen - Ages 16-79 yo.  Completed   HPV Vaccine  Aged Out  *Topic was postponed. The date shown is not the original due date.    Advanced directives: no  Conditions/risks identified: none  Next appointment: Follow up in one year for your annual wellness visit 06/18/23 @ 10:30 am by phone   Preventive Care 65 Years and Older, Female Preventive care refers to lifestyle choices and visits with your health care provider that can promote health and wellness. What does preventive care include? A yearly physical exam. This is also called an annual well check. Dental exams once or twice a year. Routine eye exams. Ask your health care provider how often you should have your eyes checked. Personal lifestyle choices, including: Daily care of your teeth and gums. Regular physical activity. Eating a healthy diet. Avoiding tobacco and drug use. Limiting  alcohol use. Practicing safe sex. Taking low-dose aspirin every day. Taking vitamin and mineral supplements as recommended by your health care provider. What happens during an annual well check? The services and screenings done by your health care provider during your annual well check will depend on your age, overall health, lifestyle risk factors, and family history of disease. Counseling  Your health care provider may ask you questions about your: Alcohol use. Tobacco use. Drug use. Emotional well-being. Home and relationship well-being. Sexual activity. Eating habits. History of falls. Memory and ability to understand (cognition). Work and work Statistician. Reproductive health. Screening  You may have the following tests or measurements: Height, weight, and BMI. Blood pressure. Lipid and cholesterol levels. These may be checked every 5 years, or more frequently if you are over 98 years old. Skin check. Lung cancer screening. You may have this screening every year starting at age 58 if you have a 30-pack-year history of smoking and currently smoke or have quit within the past 15 years. Fecal occult blood test (FOBT) of the stool. You may have this test every year starting at age 28. Flexible sigmoidoscopy or colonoscopy. You may have a sigmoidoscopy every 5 years or a colonoscopy every 10 years starting at age 22. Hepatitis C blood test. Hepatitis B blood test. Sexually transmitted disease (STD) testing. Diabetes  screening. This is done by checking your blood sugar (glucose) after you have not eaten for a while (fasting). You may have this done every 1-3 years. Bone density scan. This is done to screen for osteoporosis. You may have this done starting at age 22. Mammogram. This may be done every 1-2 years. Talk to your health care provider about how often you should have regular mammograms. Talk with your health care provider about your test results, treatment options, and if  necessary, the need for more tests. Vaccines  Your health care provider may recommend certain vaccines, such as: Influenza vaccine. This is recommended every year. Tetanus, diphtheria, and acellular pertussis (Tdap, Td) vaccine. You may need a Td booster every 10 years. Zoster vaccine. You may need this after age 29. Pneumococcal 13-valent conjugate (PCV13) vaccine. One dose is recommended after age 48. Pneumococcal polysaccharide (PPSV23) vaccine. One dose is recommended after age 50. Talk to your health care provider about which screenings and vaccines you need and how often you need them. This information is not intended to replace advice given to you by your health care provider. Make sure you discuss any questions you have with your health care provider. Document Released: 04/22/2015 Document Revised: 12/14/2015 Document Reviewed: 01/25/2015 Elsevier Interactive Patient Education  2017 [employer] Prevention in the Home Falls can cause injuries. They can happen to people of all ages. There are many things you can do to make your home safe and to help prevent falls. What can I do on the outside of my home? Regularly fix the edges of walkways and driveways and fix any cracks. Remove anything that might make you trip as you walk through a door, such as a raised step or threshold. Trim any bushes or trees on the path to your home. Use bright outdoor lighting. Clear any walking paths of anything that might make someone trip, such as rocks or tools. Regularly check to see if handrails are loose or broken. Make sure that both sides of any steps have handrails. Any raised decks and porches should have guardrails on the edges. Have any leaves, snow, or ice cleared regularly. Use sand or salt on walking paths during winter. Clean up any spills in your garage right away. This includes oil or grease spills. What can I do in the bathroom? Use night lights. Install grab bars by the toilet  and in the tub and shower. Do not use towel bars as grab bars. Use non-skid mats or decals in the tub or shower. If you need to sit down in the shower, use a plastic, non-slip stool. Keep the floor dry. Clean up any water that spills on the floor as soon as it happens. Remove soap buildup in the tub or shower regularly. Attach bath mats securely with double-sided non-slip rug tape. Do not have throw rugs and other things on the floor that can make you trip. What can I do in the bedroom? Use night lights. Make sure that you have a light by your bed that is easy to reach. Do not use any sheets or blankets that are too big for your bed. They should not hang down onto the floor. Have a firm chair that has side arms. You can use this for support while you get dressed. Do not have throw rugs and other things on the floor that can make you trip. What can I do in the kitchen? Clean up any spills right away. Avoid walking on wet floors. Keep  items that you use a lot in easy-to-reach places. If you need to reach something above you, use a strong step stool that has a grab bar. Keep electrical cords out of the way. Do not use floor polish or wax that makes floors slippery. If you must use wax, use non-skid floor wax. Do not have throw rugs and other things on the floor that can make you trip. What can I do with my stairs? Do not leave any items on the stairs. Make sure that there are handrails on both sides of the stairs and use them. Fix handrails that are broken or loose. Make sure that handrails are as long as the stairways. Check any carpeting to make sure that it is firmly attached to the stairs. Fix any carpet that is loose or worn. Avoid having throw rugs at the top or bottom of the stairs. If you do have throw rugs, attach them to the floor with carpet tape. Make sure that you have a light switch at the top of the stairs and the bottom of the stairs. If you do not have them, ask someone to add  them for you. What else can I do to help prevent falls? Wear shoes that: Do not have high heels. Have rubber bottoms. Are comfortable and fit you well. Are closed at the toe. Do not wear sandals. If you use a stepladder: Make sure that it is fully opened. Do not climb a closed stepladder. Make sure that both sides of the stepladder are locked into place. Ask someone to hold it for you, if possible. Clearly mark and make sure that you can see: Any grab bars or handrails. First and last steps. Where the edge of each step is. Use tools that help you move around (mobility aids) if they are needed. These include: Canes. Walkers. Scooters. Crutches. Turn on the lights when you go into a dark area. Replace any light bulbs as soon as they burn out. Set up your furniture so you have a clear path. Avoid moving your furniture around. If any of your floors are uneven, fix them. If there are any pets around you, be aware of where they are. Review your medicines with your doctor. Some medicines can make you feel dizzy. This can increase your chance of falling. Ask your doctor what other things that you can do to help prevent falls. This information is not intended to replace advice given to you by your health care provider. Make sure you discuss any questions you have with your health care provider. Document Released: 01/20/2009 Document Revised: 09/01/2015 Document Reviewed: 04/30/2014 Elsevier Interactive Patient Education  2017 [employer].

## 2022-06-12 NOTE — Progress Notes (Signed)
I connected with  Melissa Merritt on 06/12/22 by a audio enabled telemedicine application and verified that I am speaking with the correct person using two identifiers.  Patient Location: Home  Provider Location: Office/Clinic  I discussed the limitations of evaluation and management by telemedicine. The patient expressed understanding and agreed to proceed.  Subjective:   Melissa Merritt is a 72 y.o. female who presents for Medicare Annual (Subsequent) preventive examination.  Review of Systems     Cardiac Risk Factors include: advanced age (>70mn, >>64women);hypertension;obesity (BMI >30kg/m2)     Objective:    There were no vitals filed for this visit. There is no height or weight on file to calculate BMI.     06/12/2022   10:58 AM 02/15/2022   10:15 AM 02/05/2022    9:33 AM 08/15/2021   10:20 AM 06/01/2021    8:20 AM 12/21/2020    9:43 AM 11/24/2020    1:03 PM  Advanced Directives  Does Patient Have a Medical Advance Directive? Yes Yes Yes Yes Yes Yes Yes  Type of AParamedicof AEast [place] will  [place]Living will [place]Living will Healthcare Power of AE[place]Living will [place]Living will  Does patient want to make changes to medical advance directive? No - Patient declined  No - Patient declined   No - Patient declined   Copy of H[place] Chart? Yes - validated most recent copy scanned in chart (See row information)  No - copy requested No - copy requested No - copy requested No - copy requested     Current Medications (verified) Outpatient Encounter Medications as of 06/12/2022  Medication Sig   Biotin 5000 MCG TABS Take 5,000 mcg by mouth 2 (two) times daily.   Calcium Carb-Cholecalciferol (CALCIUM-VITAMIN D) 600-400 MG-UNIT TABS Take 1 tablet by mouth 2 (two) times daily.   cholecalciferol (VITAMIN D3) 25 MCG (1000 UNIT) tablet  Take 1,000 Units by mouth in the morning and at bedtime.   Cyanocobalamin (B-12) 2500 MCG TABS Take 2,500 mcg by mouth daily.   denosumab (PROLIA) 60 MG/ML SOSY injection Inject 60 mg into the skin every 6 (six) months.   exemestane (AROMASIN) 25 MG tablet Take 1 tablet (25 mg total) by mouth daily after breakfast.   ferrous sulfate 220 (44 Fe) MG/5ML solution Take 220 mg by mouth daily.   metoprolol succinate (TOPROL-XL) 25 MG 24 hr tablet Take 1 tablet (25 mg total) by mouth daily.   pantoprazole (PROTONIX) 40 MG tablet Take 1 tablet (40 mg total) by mouth 2 (two) times daily.   Red Yeast Rice Extract (RED YEAST RICE PO) Take 1 tablet by mouth in the morning and at bedtime.   spironolactone (ALDACTONE) 25 MG tablet Take 25 mg by mouth daily.   St Johns Wort 300 MG CAPS Take 300 mg by mouth 2 (two) times daily.   TRELEGY ELLIPTA 100-62.5-25 MCG/INH AEPB Inhale 1 puff into the lungs daily.   UPNEEQ 0.1 % SOLN Apply to eye.   valsartan (DIOVAN) 160 MG tablet Take 1 tablet (160 mg total) by mouth daily.   amLODipine (NORVASC) 10 MG tablet Take 1 tablet (10 mg total) by mouth daily. (Patient not taking: Reported on 06/12/2022)   [DISCONTINUED] furosemide (LASIX) 20 MG tablet Take by mouth.   No facility-administered encounter medications on file as of 06/12/2022.    Allergies (verified) Contrast media [iodinated contrast media], Other,  Ace inhibitors, Hctz [hydrochlorothiazide], and Keflex [cephalexin]   History: Past Medical History:  Diagnosis Date   Benign tumor of lung    Breast cancer ([place])    Cancer ([place])    right breast   CHF (congestive heart failure) ([facility])    Dyspnea    GERD (gastroesophageal reflux disease)    Hyperlipidemia    Hypertension    Insomnia    Osteopenia    Personal history of radiation therapy    Plantar fasciitis    PONV (postoperative nausea and vomiting)    vomiting 2 days after lung surgery   S/P pneumonectomy    right lower lobe   Urinary incontinence     Past Surgical History:  Procedure Laterality Date   BREAST BIOPSY Right 07/17/2019   Affirm bx-"Ribbon" clip-[facility] & [facility]   BREAST BIOPSY Right 07/31/2021   Atereo bx, x-clip, path pending   BREAST LUMPECTOMY Right 2021   [facility], DCIS, 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   bronchoscopy     CATARACT EXTRACTION, BILATERAL     Left- 11/21/15, right- 12/23/15   CHOLECYSTECTOMY     CYSTECTOMY  11/03/2009   cheek   dilation and curretage     EYE SURGERY Bilateral    cataract   FOOT SURGERY Bilateral 2010   LOBECTOMY     LUNG SURGERY Right    PART MASTECTOMY,RADIO FREQUENCY LOCALIZER,AXILLARY SENTINEL NODE BIOPSY Right 08/13/2019   Procedure: PART MASTECTOMY,RADIO FREQUENCY LOCALIZER,AXILLARY SENTINEL NODE BIOPSY;  Surgeon: Benjamine Sprague, DO;  Location: [facility] ORS;  Service: General;  Laterality: Right;   UPPER GI ENDOSCOPY  2005   Family History  Problem Relation Age of Onset   COPD Mother    Hypertension Mother    Emphysema Mother    Osteoporosis Mother    Heart disease Father    Hyperlipidemia Sister    Hyperlipidemia Brother    Heart disease Maternal Grandmother    Heart disease Maternal Grandfather    Heart disease Paternal Grandmother    Stroke Paternal Grandmother    Heart disease Paternal Grandfather    Stroke Paternal Grandfather    Cancer Maternal Aunt        breast and lung   Osteoporosis Maternal Aunt    Aortic aneurysm Maternal Aunt    Breast cancer Maternal Aunt 76   Social History   Socioeconomic History   Marital status: Married    Spouse name: Not on file   Number of children: Not on file   Years of education: Not on file   Highest education level: Some college, no degree  Occupational History   Occupation: retired   Tobacco Use   Smoking status: Former    Types: Cigarettes    Quit date: 07/09/1979    Years since quitting: 42.9   Smokeless tobacco: Never  Vaping Use   Vaping Use: Never used  Substance and Sexual Activity   Alcohol use: No   Drug use: No    Sexual activity: Yes  Other Topics Concern   Not on file  Social History Narrative   Not on file   Social Determinants of Health   Financial Resource Strain: Low Risk  (06/12/2022)   Overall Financial Resource Strain (CARDIA)    Difficulty of Paying Living Expenses: Not hard at all  Food Insecurity: No Food Insecurity (06/12/2022)   Hunger Vital Sign    Worried About Running Out of Food in the Last Year: Never true    Ran Out of Food in the Last Year:  Never true  Transportation Needs: No Transportation Needs (06/12/2022)   PRAPARE - Hydrologist (Medical): No    Lack of Transportation (Non-Medical): No  Physical Activity: Insufficiently Active (06/12/2022)   Exercise Vital Sign    Days of Exercise per Week: 2 days    Minutes of Exercise per Session: 20 min  Stress: No Stress Concern Present (06/12/2022)   [place]    Feeling of Stress : Not at all  Social Connections: Moderately Isolated (06/12/2022)   Social Connection and Isolation Panel [NHANES]    Frequency of Communication with Friends and Family: More than three times a week    Frequency of Social Gatherings with Friends and Family: More than three times a week    Attends Religious Services: Never    Marine scientist or Organizations: No    Attends Music therapist: Never    Marital Status: Married    Tobacco Counseling Counseling given: Not Answered   Clinical Intake:  Pre-visit preparation completed: Yes  Pain : No/denies pain     Nutritional Risks: None Diabetes: No  How often do you need to have someone help you when you read instructions, pamphlets, or other written materials from your doctor or pharmacy?: 1 - Never  Diabetic?no  Interpreter Needed?: No  Information entered by :: Kirke Shaggy, LPN   Activities of Daily Living    06/12/2022   10:59 AM 06/12/2022    2:27 AM  In your present  state of health, do you have any difficulty performing the following activities:  Hearing? 0 0  Vision? 0 0  Difficulty concentrating or making decisions? 0 0  Walking or climbing stairs? 0 0  Dressing or bathing? 0 0  Doing errands, shopping? 0 0  Preparing Food and eating ? N N  Using the Toilet? N N  In the past six months, have you accidently leaked urine? Y Y  Do you have problems with loss of bowel control? N N  Managing your Medications? N N  Managing your Finances? N N  Housekeeping or managing your Housekeeping? N N    Patient Care Team: Venita Lick, NP as PCP - General (Nurse Practitioner) Idelle Leech, OD (Optometry) Isaias Cowman, MD as Consulting Physician (Cardiology) Manya Silvas, MD (Inactive) (Gastroenterology) Lucky Cowboy Erskine Squibb, MD as Referring Physician (Vascular Surgery) Clabe Seal, Benny Lennert, MD (Dermatology) Theodore Demark, RN (Inactive) as Oncology Nurse Navigator Benjamine Sprague, DO as Consulting Physician (Surgery) Noreene Filbert, MD as Radiation Oncologist (Radiation Oncology) Cammie Sickle, MD as Consulting Physician (Hematology and Oncology)  Indicate any recent Medical Services you may have received from other than [facility] providers in the past year (date may be approximate).     Assessment:   This is a routine wellness examination for Tatayanna.  Hearing/Vision screen Hearing Screening - Comments:: No aids Vision Screening - Comments:: Readers- Dr.Nice  Dietary issues and exercise activities discussed: Current Exercise Habits: Home exercise routine, Type of exercise: walking, Time (Minutes): 20, Frequency (Times/Week): 2, Weekly Exercise (Minutes/Week): 40, Intensity: Mild   Goals Addressed             This Visit's Progress    DIET - EAT MORE FRUITS AND VEGETABLES         Depression Screen    06/12/2022   10:57 AM 05/24/2022    9:12 AM 05/15/2022    3:03 PM 11/22/2021  8:21 AM 06/01/2021   10:09 AM  05/25/2021   10:02 AM 11/24/2020    2:53 PM  PHQ 2/9 Scores  PHQ - 2 Score 0 0 0 0 0 0 0  PHQ- 9 Score 0 '3 3 5 3 3 2    '$ Fall Risk    06/12/2022   10:59 AM 06/12/2022    2:27 AM 05/24/2022    9:24 AM 05/24/2022    9:12 AM 05/15/2022    3:04 PM  Fall Risk   Falls in the past year? 0 0 0 0 0  Number falls in past yr: 0  0 0 0  Injury with Fall? 0 0 0 0 0  Risk for fall due to : No Fall Risks  No Fall Risks No Fall Risks No Fall Risks  Follow up Falls prevention discussed;Falls evaluation completed  Education provided Falls evaluation completed Falls evaluation completed    FALL RISK PREVENTION PERTAINING TO THE HOME:  Any stairs in or around the home? Yes  If so, are there any without handrails? No  Home free of loose throw rugs in walkways, pet beds, electrical cords, etc? Yes  Adequate lighting in your home to reduce risk of falls? Yes   ASSISTIVE DEVICES UTILIZED TO PREVENT FALLS:  Life alert? No  Use of a cane, walker or w/c? No  Grab bars in the bathroom? Yes  Shower chair or bench in shower? Yes  Elevated toilet seat or a handicapped toilet? Yes   Cognitive Function:        06/12/2022   11:04 AM 05/30/2020    9:05 AM 05/25/2019    9:12 AM 05/19/2018   10:03 AM 05/17/2017    9:21 AM  6CIT Screen  What Year? 0 points 0 points 0 points 0 points 0 points  What month? 0 points 0 points 0 points 0 points 0 points  What time? 0 points 0 points 0 points 0 points 0 points  Count back from 20 0 points 0 points 0 points 0 points 0 points  Months in reverse 0 points 0 points 0 points 0 points 0 points  Repeat phrase 0 points 0 points 0 points 0 points 0 points  Total Score 0 points 0 points 0 points 0 points 0 points    Immunizations Immunization History  Administered Date(s) Administered   Fluad Quad(high Dose 65+) 02/24/2019   Influenza, High Dose Seasonal PF 12/27/2015, 01/15/2018, 01/16/2022   Influenza,inj,Quad PF,6+ Mos 01/21/2015   Influenza-Unspecified 02/08/2017,  02/09/2021   Moderna Sars-Covid-2 Vaccination 06/10/2019, 07/07/2019, 04/28/2020   Pneumococcal Conjugate-13 05/19/2014   Pneumococcal Polysaccharide-23 01/14/2008, 05/23/2015   Td 09/11/2005   Tdap 12/27/2015   Zoster, Live 09/04/2011    TDAP status: Up to date  Flu Vaccine status: Up to date  Pneumococcal vaccine status: Up to date  Covid-19 vaccine status: Completed vaccines  Qualifies for Shingles Vaccine? Yes   Zostavax completed Yes   Shingrix Completed?: No.    Education has been provided regarding the importance of this vaccine. Patient has been advised to call insurance company to determine out of pocket expense if they have not yet received this vaccine. Advised may also receive vaccine at local pharmacy or Health Dept. Verbalized acceptance and understanding.  Screening Tests Health Maintenance  Topic Date Due   COLONOSCOPY (Pts 45-5yr Insurance coverage will need to be confirmed)  10/20/2018   COVID-19 Vaccine (4 - 2023-24 season) 12/08/2021   Zoster Vaccines- Shingrix (1 of 2) 08/22/2022 (Originally 12/01/1969)  Medicare Annual Wellness (AWV)  06/12/2023   MAMMOGRAM  07/13/2023   DEXA SCAN  08/03/2024   DTaP/Tdap/Td (3 - Td or Tdap) 12/26/2025   Pneumonia Vaccine 70+ Years old  Completed   INFLUENZA VACCINE  Completed   Hepatitis C Screening  Completed   HPV VACCINES  Aged Out    Health Maintenance  Health Maintenance Due  Topic Date Due   COLONOSCOPY (Pts 45-65yr Insurance coverage will need to be confirmed)  10/20/2018   COVID-19 Vaccine (4 - 2023-24 season) 12/08/2021    Colorectal cancer screening: Type of screening: Colonoscopy. Completed scheduled for 06/21/22. Repeat every 5 years  Mammogram status: Completed scheduled for 07/16/22. Repeat every year  Bone Density status: Completed scheduled for 07/16/22. Results reflect: Bone density results: NORMAL. Repeat every 5 years.  Lung Cancer Screening: (Low Dose CT Chest recommended if Age 74-80years,  30 pack-year currently smoking OR have quit w/in 15years.) does not qualify.     Additional Screening:  Hepatitis C Screening: does qualify; Completed 12/27/15  Vision Screening: Recommended annual ophthalmology exams for early detection of glaucoma and other disorders of the eye. Is the patient up to date with their annual eye exam?  Yes  Who is the provider or what is the name of the office in which the patient attends annual eye exams? Dr.Nice If pt is not established with a provider, would they like to be referred to a provider to establish care? No .   Dental Screening: Recommended annual dental exams for proper oral hygiene  Community Resource Referral / Chronic Care Management: CRR required this visit?  No   CCM required this visit?  No      Plan:     I have personally reviewed and noted the following in the patient's chart:   Medical and social history Use of alcohol, tobacco or illicit drugs  Current medications and supplements including opioid prescriptions. Patient is not currently taking opioid prescriptions. Functional ability and status Nutritional status Physical activity Advanced directives List of other physicians Hospitalizations, surgeries, and ER visits in previous 12 months Vitals Screenings to include cognitive, depression, and falls Referrals and appointments  In addition, I have reviewed and discussed with patient certain preventive protocols, quality metrics, and best practice recommendations. A written personalized care plan for preventive services as well as general preventive health recommendations were provided to patient.     LDionisio David LPN   [id number]  Nurse Notes: none

## 2022-06-15 DIAGNOSIS — I420 Dilated cardiomyopathy: Secondary | ICD-10-CM | POA: Diagnosis not present

## 2022-06-20 ENCOUNTER — Ambulatory Visit: Payer: Medicare Other | Admitting: General Practice

## 2022-06-20 ENCOUNTER — Encounter: Payer: Self-pay | Admitting: Surgery

## 2022-06-20 ENCOUNTER — Encounter
Admission: RE | Disposition: A | Discharge status: Home / Self Care | Payer: Self-pay | Source: Ambulatory Visit | Attending: Surgery

## 2022-06-20 ENCOUNTER — Other Ambulatory Visit: Payer: Self-pay

## 2022-06-20 ENCOUNTER — Ambulatory Visit
Admission: RE | Admit: 2022-06-20 | Discharge: 2022-06-20 | Disposition: A | Discharge status: Home / Self Care | Payer: Medicare Other | Source: Ambulatory Visit | Attending: Surgery | Admitting: Surgery

## 2022-06-20 DIAGNOSIS — Z1211 Encounter for screening for malignant neoplasm of colon: Secondary | ICD-10-CM | POA: Insufficient documentation

## 2022-06-20 DIAGNOSIS — J449 Chronic obstructive pulmonary disease, unspecified: Secondary | ICD-10-CM | POA: Diagnosis not present

## 2022-06-20 DIAGNOSIS — Z87891 Personal history of nicotine dependence: Secondary | ICD-10-CM | POA: Diagnosis not present

## 2022-06-20 DIAGNOSIS — D123 Benign neoplasm of transverse colon: Secondary | ICD-10-CM | POA: Diagnosis not present

## 2022-06-20 DIAGNOSIS — I110 Hypertensive heart disease with heart failure: Secondary | ICD-10-CM | POA: Diagnosis not present

## 2022-06-20 DIAGNOSIS — I509 Heart failure, unspecified: Secondary | ICD-10-CM | POA: Insufficient documentation

## 2022-06-20 DIAGNOSIS — K5730 Diverticulosis of large intestine without perforation or abscess without bleeding: Secondary | ICD-10-CM | POA: Diagnosis not present

## 2022-06-20 DIAGNOSIS — K635 Polyp of colon: Secondary | ICD-10-CM | POA: Diagnosis not present

## 2022-06-20 DIAGNOSIS — G4730 Sleep apnea, unspecified: Secondary | ICD-10-CM | POA: Diagnosis not present

## 2022-06-20 DIAGNOSIS — K6389 Other specified diseases of intestine: Secondary | ICD-10-CM | POA: Diagnosis not present

## 2022-06-20 DIAGNOSIS — K219 Gastro-esophageal reflux disease without esophagitis: Secondary | ICD-10-CM | POA: Diagnosis not present

## 2022-06-20 DIAGNOSIS — K644 Residual hemorrhoidal skin tags: Secondary | ICD-10-CM | POA: Diagnosis not present

## 2022-06-20 DIAGNOSIS — Z902 Acquired absence of lung [part of]: Secondary | ICD-10-CM | POA: Diagnosis not present

## 2022-06-20 DIAGNOSIS — K648 Other hemorrhoids: Secondary | ICD-10-CM | POA: Diagnosis not present

## 2022-06-20 DIAGNOSIS — Z85118 Personal history of other malignant neoplasm of bronchus and lung: Secondary | ICD-10-CM | POA: Diagnosis not present

## 2022-06-20 DIAGNOSIS — Z86010 Personal history of colonic polyps: Secondary | ICD-10-CM | POA: Diagnosis not present

## 2022-06-20 HISTORY — PX: COLONOSCOPY WITH PROPOFOL: SHX5780

## 2022-06-20 SURGERY — COLONOSCOPY WITH PROPOFOL
Anesthesia: General

## 2022-06-20 MED ORDER — PROPOFOL 500 MG/50ML IV EMUL
INTRAVENOUS | Status: DC | PRN
  Administered 2022-06-20: 120 ug/kg/min via INTRAVENOUS

## 2022-06-20 MED ORDER — PROPOFOL 1000 MG/100ML IV EMUL
INTRAVENOUS | Status: AC
  Filled 2022-06-20: qty 100

## 2022-06-20 MED ORDER — PROPOFOL 10 MG/ML IV BOLUS
INTRAVENOUS | Status: DC | PRN
  Administered 2022-06-20: 30 mg via INTRAVENOUS
  Administered 2022-06-20: 50 mg via INTRAVENOUS
  Administered 2022-06-20: 70 mg via INTRAVENOUS

## 2022-06-20 MED ORDER — LIDOCAINE HCL (CARDIAC) PF 100 MG/5ML IV SOSY
PREFILLED_SYRINGE | INTRAVENOUS | Status: DC | PRN
  Administered 2022-06-20: 50 mg via INTRAVENOUS

## 2022-06-20 MED ORDER — SODIUM CHLORIDE 0.9 % IV SOLN: INTRAVENOUS | Status: DC

## 2022-06-20 NOTE — Anesthesia Preprocedure Evaluation (Signed)
Anesthesia Evaluation  Patient identified by MRN, date of birth, ID band Patient awake    Reviewed: Allergy & Precautions, H&P , NPO status , Patient's Chart, lab work & pertinent test results  History of Anesthesia Complications (+) PONV and history of anesthetic complications (severe PONV after lobectomy)  Airway Mallampati: III  TM Distance: >3 FB Neck ROM: full    Dental  (+) Chipped, Dental Advidsory Given   Pulmonary shortness of breath (chronic for months, followed by Pulmonology) and with exertion, sleep apnea , COPD, former smoker S/p RLL lobectomy for lung CA Chronic SOB, noted to have elevated D-dimer on labs drawn on 08/10/19, sent to ED last night after the lab resulted. CTA in ED was negative for PE.  Pt states SOB is at baseline for her  No increased WOB Pulmonary exam normal breath sounds clear to auscultation       Cardiovascular hypertension, (-) angina +CHF  Normal cardiovascular exam Rhythm:regular Rate:Normal  Echo [id number]: MILD LV SYSTOLIC DYSFUNCTION WITH AN ESTIMATED EF = 45-50 % NORMAL RIGHT VENTRICULAR SYSTOLIC FUNCTION TRACE TRICUSPID AND MITRAL VALVE INSUFFICIENCY NO VALVULAR STENOSIS MILD LV ENLARGEMENT   Neuro/Psych negative neurological ROS  negative psych ROS   GI/Hepatic Neg liver ROS,GERD  Controlled,,  Endo/Other  negative endocrine ROS    Renal/GU negative Renal ROS  negative genitourinary   Musculoskeletal   Abdominal   Peds  Hematology negative hematology ROS (+)   Anesthesia Other Findings Past Medical History: No date: Benign tumor of lung No date: Breast cancer ([place]) No date: Cancer ([place])     Comment:  right breast No date: CHF (congestive heart failure) (H[facility]) No date: Dyspnea No date: GERD (gastroesophageal reflux disease) No date: Hyperlipidemia No date: Hypertension No date: Insomnia No date: Osteopenia No date: Personal history of radiation therapy No date:  Plantar fasciitis No date: PONV (postoperative nausea and vomiting)     Comment:  vomiting 2 days after lung surgery No date: S/P pneumonectomy     Comment:  right lower lobe No date: Urinary incontinence  Past Surgical History: 07/17/2019: BREAST BIOPSY; Right     Comment:  Affirm bx-"Ribbon" clip-IMC & DCIS 07/31/2021: BREAST BIOPSY; Right     Comment:  Atereo bx, x-clip, path pending 2021: BREAST LUMPECTOMY; Right     Comment:  IMC, DCIS, 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 No date: bronchoscopy No date: CATARACT EXTRACTION, BILATERAL     Comment:  Left- 11/21/15, right- 12/23/15 No date: CHOLECYSTECTOMY 11/03/2009: CYSTECTOMY     Comment:  cheek No date: dilation and curretage No date: EYE SURGERY; Bilateral     Comment:  cataract 2010: FOOT SURGERY; Bilateral No date: LOBECTOMY No date: LUNG SURGERY; Right 08/13/2019: PART MASTECTOMY,RADIO FREQUENCY LOCALIZER,AXILLARY  SENTINEL NODE BIOPSY; Right     Comment:  Procedure: PART [place] SENTINEL NODE BIOPSY;  Surgeon: Benjamine Sprague, DO;  Location: [facility] ORS;  Service: General;                Laterality: Right; 2005: UPPER GI ENDOSCOPY  BMI    Body Mass Index: 35.24 kg/m      Reproductive/Obstetrics negative OB ROS                             Anesthesia Physical Anesthesia Plan  ASA: 3  Anesthesia Plan: General   Post-op Pain Management: Minimal or no pain anticipated   Induction: Intravenous  PONV Risk Score and Plan: 3 and Propofol infusion, TIVA and Ondansetron  Airway Management Planned: Nasal Cannula  Additional Equipment: None  Intra-op Plan:   Post-operative Plan:   Informed Consent: I have reviewed the patients History and Physical, chart, labs and discussed the procedure including the risks, benefits and alternatives for the proposed anesthesia with the patient or authorized representative who has indicated his/her  understanding and acceptance.     Dental advisory given  Plan Discussed with: CRNA and Surgeon  Anesthesia Plan Comments: (Discussed risks of anesthesia with patient, including possibility of difficulty with spontaneous ventilation under anesthesia necessitating airway intervention, PONV, and rare risks such as cardiac or respiratory or neurological events, and allergic reactions. Discussed the role of CRNA in patient's perioperative care. Patient understands.)        Anesthesia Quick Evaluation

## 2022-06-20 NOTE — Anesthesia Postprocedure Evaluation (Signed)
Anesthesia Post Note  Patient: Melissa Merritt  Procedure(s) Performed: COLONOSCOPY WITH PROPOFOL  Patient location during evaluation: Endoscopy Anesthesia Type: General Level of consciousness: awake and alert Pain management: pain level controlled Vital Signs Assessment: post-procedure vital signs reviewed and stable Respiratory status: spontaneous breathing, nonlabored ventilation, respiratory function stable and patient connected to nasal cannula oxygen Cardiovascular status: blood pressure returned to baseline and stable Postop Assessment: no apparent nausea or vomiting Anesthetic complications: no  No notable events documented.   Last Vitals:  Vitals:   06/20/22 0815 06/20/22 0825  BP: (!) 142/72 (!) 161/85  Pulse: 69 65  Resp: 18 17  Temp:    SpO2: 98% 98%    Last Pain:  Vitals:   06/20/22 0825  TempSrc:   PainSc: 0-No pain                 Dimas Millin

## 2022-06-20 NOTE — Op Note (Signed)
[facility] Patient Name: Melissa Merritt Procedure Date: 06/20/2022 7:07 AM MRN: [id number] Account #: [id number] Date of Birth: 1951/03/02 Admit Type: Outpatient Age: 72 Room: [facility] ENDO ROOM 1 Gender: Female Note Status: Finalized Instrument Name: Park Meo [id number] Procedure:             Colonoscopy Indications:           High risk colon cancer surveillance: Personal history                         of colonic polyps Providers:             Benjamine Sprague MD, MD Referring MD:          Barbaraann Faster. [name] (Referring MD) Medicines:             Propofol per Anesthesia Complications:         No immediate complications. Procedure:             Pre-Anesthesia Assessment:                        - After reviewing the risks and benefits, the patient                         was deemed in satisfactory condition to undergo the                         procedure in an ambulatory setting.                        After obtaining informed consent, the colonoscope was                         passed under direct vision. Throughout the procedure,                         the patient's blood pressure, pulse, and oxygen                         saturations were monitored continuously. The                         Colonoscope was introduced through the anus and                         advanced to the the cecum, identified by the ileocecal                         valve. The colonoscopy was performed without                         difficulty. The patient tolerated the procedure well.                         The quality of the bowel preparation was good. Findings:      Skin tags were found on perianal exam.      A few small-mouthed diverticula were found in the sigmoid colon.      Two sessile polyps were found in the proximal sigmoid colon and proximal       transverse  colon. The polyps were 2 to 4 mm in size. These were biopsied       with a cold forceps for histology. Estimated  blood loss was minimal.      Non-bleeding internal hemorrhoids were found during retroflexion. The       hemorrhoids were Grade II (internal hemorrhoids that prolapse but reduce       spontaneously). Impression:            - Perianal skin tags found on perianal exam.                        - Diverticulosis in the sigmoid colon.                        - Two 2 to 4 mm polyps in the proximal sigmoid colon                         and in the proximal transverse colon. Biopsied. Recommendation:        - Written discharge instructions were provided to the                         patient.                        - Discharge patient to home.                        - Resume previous diet.                        - Await pathology results. Procedure Code(s):     --- Professional ---                        [phone], Colonoscopy, flexible; with biopsy, single or                         multiple Diagnosis Code(s):     --- Professional ---                        Z86.010, Personal history of colonic polyps                        D12.5, Benign neoplasm of sigmoid colon                        D12.3, Benign neoplasm of transverse colon (hepatic                         flexure or splenic flexure)                        K64.4, Residual hemorrhoidal skin tags                        K57.30, Diverticulosis of large intestine without                         perforation or abscess without bleeding CPT copyright 2022 American Medical Association. All rights reserved. The codes documented in this report are preliminary and upon coder  review may  be revised to meet current compliance requirements. Dr. Sheppard Penton, MD Benjamine Sprague MD, MD 06/20/2022 7:54:41 AM This report has been signed electronically. Number of Addenda: 0 Note Initiated On: 06/20/2022 7:07 AM Scope Withdrawal Time: 0 hours 11 minutes 28 seconds  Total Procedure Duration: 0 hours 18 minutes 41 seconds  Estimated Blood Loss:  Estimated blood loss was  minimal.      [facility]

## 2022-06-20 NOTE — Interval H&P Note (Signed)
History and Physical Interval Note:  06/20/2022 7:09 AM  Melissa Merritt  has presented today for surgery, with the diagnosis of personal history of colonic polyps Z86.010.  The various methods of treatment have been discussed with the patient and family. After consideration of risks, benefits and other options for treatment, the patient has consented to  Procedure(s): COLONOSCOPY WITH PROPOFOL (N/A) as a surgical intervention.  The patient's history has been reviewed, patient examined, no change in status, stable for surgery.  I have reviewed the patient's chart and labs.  Questions were answered to the patient's satisfaction.     Daziyah Cogan [name]

## 2022-06-20 NOTE — H&P (Signed)
Subjective:   CC: Personal history of colonic polyps [Z86.010]  HPI: Melissa Merritt is a 72 y.o. female who was referred by Norman Herrlich, * for evaluation of above.  Recent norovirus infection but has resolved now. History of colon polyps in the past.  Past Medical History: has a past medical history of Bladder prolapse, female, acquired, Colon polyp, H/O mammogram (12/05/2009), H/O mammogram (01/17/2011), H/O mammogram (01/22/2012), H/O mammogram (01/23/2013), Heart disease, History of bone density study (07/03/2004), History of bone density study (07/10/2006), History of bone density study (12/26/2011), adenomatous colonic polyps, Hypertension, Lung disease, Obesity, Sleep apnea, and Ulcer.  Past Surgical History: has a past surgical history that includes Thoracoscopy With Lobectomy Lung Robot Assisted (Right, 01/2004); Colonoscopy (09/22/2004); egd (11/17/2003); Colonoscopy (10/19/2013); Hysteroscopy; Laparoscopic cholecystectomy; Cataract extraction; and Mastectomy, partial (Right, 08/13/2019).  Family History: family history includes COPD in her mother; Coronary Artery Disease (Blocked arteries around heart) in her father; Heart disease in her father; High blood pressure (Hypertension) in her brother, maternal aunt, and sister; Myocardial Infarction (Heart attack) in her father; No Known Problems in her son.  Social History: reports that she quit smoking about 43 years ago. Her smoking use included cigarettes. She started smoking about 52 years ago. She has a 20 pack-year smoking history. She has never used smokeless tobacco. She reports current alcohol use. She reports that she does not use drugs.  Current Medications: has a current medication list which includes the following prescription(s): amlodipine, calcium carbonate-vitamin d3, cyanocobalamin, denosumab, exemestane, ferrous sulfate, furosemide, hydrocortisone, metoprolol succinate, pantoprazole, red yeast rice, [facility]. john's  wort, and valsartan.  Allergies:  Allergies  Allergen Reactions  Iodinated Contrast Media Swelling  Eye swelling shut  Other Swelling  Eye swelling shut IV contrast media  Ace Inhibitors Other (See Comments)  "Makes me feel strange."  Cephalexin Other (See Comments)  thrush  Hydrochlorothiazide Other (See Comments)  "Makes me feel wierd."  Iodine Swelling  Angioedema of eyes   ROS:  A 15 point review of systems was performed and pertinent positives and negatives noted in HPI  Objective:    BP (!) 153/81  Pulse 87  Ht 170.2 cm ('5\' 7"'$ )  Wt (!) 105.2 kg (232 lb)  BMI 36.34 kg/m   Constitutional : No distress, cooperative, alert  Lymphatics/Throat: Supple with no lymphadenopathy  Respiratory: Clear to auscultation bilaterally  Cardiovascular: Regular rate and rhythm  Gastrointestinal: Soft, non-tender, non-distended, no organomegaly.  Musculoskeletal: Steady gait and movement  Skin: Cool and moist surgical scars  Psychiatric: Normal affect, non-agitated, not confused     LABS:  N/a   RADS: N/a  Assessment:    Personal history of colonic polyps [Z86.010]  Plan:    1. Personal history of colonic polyps [Z86.010] R/b/a discussed. Risks include bleeding, perforation. Benefits include diagnostic, curative procedure if needed. Alternatives include continued observation. Pt verbalized understanding.  The patient verbalized understanding and all questions were answered to the patient's satisfaction.  2. Patient has elected to proceed. Procedure will be scheduled.  labs/images/medications/previous chart entries reviewed personally and relevant changes/updates noted above.

## 2022-06-20 NOTE — Transfer of Care (Signed)
Immediate Anesthesia Transfer of Care Note  Patient: Melissa Merritt  Procedure(s) Performed: COLONOSCOPY WITH PROPOFOL  Patient Location: PACU and Endoscopy Unit  Anesthesia Type:General  Level of Consciousness: drowsy and patient cooperative  Airway & Oxygen Therapy: Patient Spontanous Breathing  Post-op Assessment: Report given to RN and Post -op Vital signs reviewed and stable  Post vital signs: Reviewed and stable  Last Vitals:  Vitals Value Taken Time  BP    Temp    Pulse    Resp    SpO2      Last Pain:  Vitals:   06/20/22 0755  TempSrc:   PainSc: 0-No pain         Complications: No notable events documented.

## 2022-06-21 ENCOUNTER — Encounter: Payer: Self-pay | Admitting: Surgery

## 2022-06-21 LAB — SURGICAL PATHOLOGY

## 2022-06-30 NOTE — Patient Instructions (Signed)
Be Involved in Your Health Care:  Taking Medications When medications are taken as directed, they can greatly improve your health. But if they are not taken as instructed, they may not work. In some cases, not taking them correctly can be harmful. To help ensure your treatment remains effective and safe, understand your medications and how to take them.  Your lab results, notes and after visit summary will be available on [name]. We strongly encourage you to use this feature. If lab results are abnormal the clinic will contact you with the appropriate steps. If the clinic does not contact you assume the results are satisfactory. You can always see your results on [name]. If you have questions regarding your condition, please contact the clinic during office hours. You can also ask questions on [name].  We at [facility] are grateful that you chose us to provide care. We strive to provide excellent and compassionate care and are always looking for feedback. If you get a survey from the clinic please complete this.   DASH Eating Plan DASH stands for Dietary Approaches to Stop Hypertension. The DASH eating plan is a healthy eating plan that has been shown to: Reduce high blood pressure (hypertension). Reduce your risk for type 2 diabetes, heart disease, and stroke. Help with weight loss. What are tips for following this plan? Reading food labels Check food labels for the amount of salt (sodium) per serving. Choose foods with less than 5 percent of the Daily Value of sodium. Generally, foods with less than 300 milligrams (mg) of sodium per serving fit into this eating plan. To find whole grains, look for the word "whole" as the first word in the ingredient list. Shopping Buy products labeled as "low-sodium" or "no salt added." Buy fresh foods. Avoid canned foods and pre-made or frozen meals. Cooking Avoid adding salt when cooking. Use salt-free seasonings or herbs instead of  table salt or sea salt. Check with your health care provider or pharmacist before using salt substitutes. Do not fry foods. Cook foods using healthy methods such as baking, boiling, grilling, roasting, and broiling instead. Cook with heart-healthy oils, such as olive, canola, avocado, soybean, or sunflower oil. Meal planning  Eat a balanced diet that includes: 4 or more servings of fruits and 4 or more servings of vegetables each day. Try to fill one-half of your plate with fruits and vegetables. 6-8 servings of whole grains each day. Less than 6 oz (170 g) of lean meat, poultry, or fish each day. A 3-oz (85-g) serving of meat is about the same size as a deck of cards. One egg equals 1 oz (28 g). 2-3 servings of low-fat dairy each day. One serving is 1 cup (237 mL). 1 serving of nuts, seeds, or beans 5 times each week. 2-3 servings of heart-healthy fats. Healthy fats called omega-3 fatty acids are found in foods such as walnuts, flaxseeds, fortified milks, and eggs. These fats are also found in cold-water fish, such as sardines, salmon, and mackerel. Limit how much you eat of: Canned or prepackaged foods. Food that is high in trans fat, such as some fried foods. Food that is high in saturated fat, such as fatty meat. Desserts and other sweets, sugary drinks, and other foods with added sugar. Full-fat dairy products. Do not salt foods before eating. Do not eat more than 4 egg yolks a week. Try to eat at least 2 vegetarian meals a week. Eat more home-cooked food and less restaurant,   buffet, and fast food. Lifestyle When eating at a restaurant, ask that your food be prepared with less salt or no salt, if possible. If you drink alcohol: Limit how much you use to: 0-1 drink a day for women who are not pregnant. 0-2 drinks a day for men. Be aware of how much alcohol is in your drink. In the [place] one drink equals one 12 oz bottle of beer (355 mL), one 5 oz glass of wine (148 mL), or one 1 oz  glass of hard liquor (44 mL). General information Avoid eating more than 2,300 mg of salt a day. If you have hypertension, you may need to reduce your sodium intake to 1,500 mg a day. Work with your health care provider to maintain a healthy body weight or to lose weight. Ask what an ideal weight is for you. Get at least 30 minutes of exercise that causes your heart to beat faster (aerobic exercise) most days of the week. Activities may include walking, swimming, or biking. Work with your health care provider or dietitian to adjust your eating plan to your individual calorie needs. What foods should I eat? Fruits All fresh, dried, or frozen fruit. Canned fruit in natural juice (without added sugar). Vegetables Fresh or frozen vegetables (raw, steamed, roasted, or grilled). Low-sodium or reduced-sodium tomato and vegetable juice. Low-sodium or reduced-sodium tomato sauce and tomato paste. Low-sodium or reduced-sodium canned vegetables. Grains Whole-grain or whole-wheat bread. Whole-grain or whole-wheat pasta. Brown rice. Oatmeal. Quinoa. Bulgur. Whole-grain and low-sodium cereals. Pita bread. Low-fat, low-sodium crackers. Whole-wheat flour tortillas. Meats and other proteins Skinless chicken or turkey. Ground chicken or turkey. Pork with fat trimmed off. Fish and seafood. Egg whites. Dried beans, peas, or lentils. Unsalted nuts, nut butters, and seeds. Unsalted canned beans. Lean cuts of beef with fat trimmed off. Low-sodium, lean precooked or cured meat, such as sausages or meat loaves. Dairy Low-fat (1%) or fat-free (skim) milk. Reduced-fat, low-fat, or fat-free cheeses. Nonfat, low-sodium ricotta or cottage cheese. Low-fat or nonfat yogurt. Low-fat, low-sodium cheese. Fats and oils Soft margarine without trans fats. Vegetable oil. Reduced-fat, low-fat, or light mayonnaise and salad dressings (reduced-sodium). Canola, safflower, olive, avocado, soybean, and sunflower oils. Avocado. Seasonings and  condiments Herbs. Spices. Seasoning mixes without salt. Other foods Unsalted popcorn and pretzels. Fat-free sweets. The items listed above may not be a complete list of foods and beverages you can eat. Contact a dietitian for more information. What foods should I avoid? Fruits Canned fruit in a light or heavy syrup. Fried fruit. Fruit in cream or butter sauce. Vegetables Creamed or fried vegetables. Vegetables in a cheese sauce. Regular canned vegetables (not low-sodium or reduced-sodium). Regular canned tomato sauce and paste (not low-sodium or reduced-sodium). Regular tomato and vegetable juice (not low-sodium or reduced-sodium). Pickles. Olives. Grains Baked goods made with fat, such as croissants, muffins, or some breads. Dry pasta or rice meal packs. Meats and other proteins Fatty cuts of meat. Ribs. Fried meat. Bacon. Bologna, salami, and other precooked or cured meats, such as sausages or meat loaves. Fat from the back of a pig (fatback). Bratwurst. Salted nuts and seeds. Canned beans with added salt. Canned or smoked fish. Whole eggs or egg yolks. Chicken or turkey with skin. Dairy Whole or 2% milk, cream, and half-and-half. Whole or full-fat cream cheese. Whole-fat or sweetened yogurt. Full-fat cheese. Nondairy creamers. Whipped toppings. Processed cheese and cheese spreads. Fats and oils Butter. Stick margarine. Lard. Shortening. Ghee. Bacon fat. Tropical oils, such as coconut, palm   kernel, or palm oil. Seasonings and condiments Onion salt, garlic salt, seasoned salt, table salt, and sea salt. Worcestershire sauce. Tartar sauce. Barbecue sauce. Teriyaki sauce. Soy sauce, including reduced-sodium. Steak sauce. Canned and packaged gravies. Fish sauce. Oyster sauce. Cocktail sauce. Store-bought horseradish. Ketchup. Mustard. Meat flavorings and tenderizers. Bouillon cubes. Hot sauces. Pre-made or packaged marinades. Pre-made or packaged taco seasonings. Relishes. Regular salad  dressings. Other foods Salted popcorn and pretzels. The items listed above may not be a complete list of foods and beverages you should avoid. Contact a dietitian for more information. Where to find more information National Heart, Lung, and Blood Institute: www.nhlbi.nih.gov American Heart Association: www.heart.org Academy of Nutrition and Dietetics: www.eatright.org National Kidney Foundation: www.kidney.org Summary The DASH eating plan is a healthy eating plan that has been shown to reduce high blood pressure (hypertension). It may also reduce your risk for type 2 diabetes, heart disease, and stroke. When on the DASH eating plan, aim to eat more fresh fruits and vegetables, whole grains, lean proteins, low-fat dairy, and heart-healthy fats. With the DASH eating plan, you should limit salt (sodium) intake to 2,300 mg a day. If you have hypertension, you may need to reduce your sodium intake to 1,500 mg a day. Work with your health care provider or dietitian to adjust your eating plan to your individual calorie needs. This information is not intended to replace advice given to you by your health care provider. Make sure you discuss any questions you have with your health care provider. Document Revised: 02/27/2019 Document Reviewed: 02/27/2019 Elsevier Patient Education  2023 Elsevier Inc.  

## 2022-07-03 ENCOUNTER — Encounter: Payer: Self-pay | Admitting: Nurse Practitioner

## 2022-07-03 ENCOUNTER — Ambulatory Visit (INDEPENDENT_AMBULATORY_CARE_PROVIDER_SITE_OTHER): Payer: Medicare Other | Admitting: Nurse Practitioner

## 2022-07-03 VITALS — BP 148/80 | HR 67 | Temp 97.9°F | Ht 67.01 in | Wt 233.6 lb

## 2022-07-03 DIAGNOSIS — I10 Essential (primary) hypertension: Secondary | ICD-10-CM | POA: Diagnosis not present

## 2022-07-03 MED ORDER — VALSARTAN 320 MG PO TABS: 320.0000 mg | ORAL_TABLET | Freq: Every day | ORAL | 4 refills | Status: DC

## 2022-07-03 NOTE — Assessment & Plan Note (Signed)
Chronic, ongoing. Above goal on check today.  Increase Valsartan to max dosing 320 MG and continue current Spironolactone and Metoprolol -- is tolerating Spironolactone and seeing less edema.  Monitor K+ level closely, BMP today.  Continue to monitor BP at home regularly and document + focus on DASH diet.  LABS: BMP.  Return in 4 weeks.  She sees cardiology next week for recheck. - Reached out to oncology via message to discuss Aromasin as have seen trend up on BP with this on board.

## 2022-07-03 NOTE — Progress Notes (Signed)
BP (!) 148/80 (BP Location: Left Arm, Patient Position: Sitting)   Pulse 67   Temp 97.9 F (36.6 C) (Oral)   Ht 5' 7.01" (1.702 m)   Wt 233 lb 9.6 oz (106 kg)   SpO2 96%   BMI 36.58 kg/m    Subjective:    Patient ID: Melissa Merritt, female    DOB: 21-Oct-1950, 72 y.o.   MRN: [id number]  HPI: Melissa Merritt is a 72 y.o. female  Chief Complaint  Patient presents with   Hypertension    Increased Amlodipine to 10mg  at last visit, here for follow up, Amlodipine caused extra swelling, Cardiology took her off and started her on Spironolactone instead, patient states this works much better and swelling is much better.    HYPERTENSION  At visit with PCP on 05/24/22 increased Amlodipine to 10 MG due to BP elevations, but this caused increased swelling.  Went to cardiology on 06/06/22, they stopped Amlodipine and started Spironolactone due to swelling.  Did tolerate 5 MG Amlodipine in past. Performed echo on 06/15/22 with LVEF 50% and normal LV systolic function and right systolic function with mild LVH - similar to echo on 08/24/20.  Takes Aromasin for history of breast cancer, has been on for 18 months.  Currently taking Spironolactone, Valsartan, and Metoprolol for blood pressure.    In past took ACE inhibitor which made her feel strange and HCTZ caused muscle aches. Hypertension status: uncontrolled  Satisfied with current treatment? yes Duration of hypertension: chronic BP monitoring frequency:  daily BP range: 150/80 range BP medication side effects:  no Medication compliance: good compliance Aspirin: no Recurrent headaches: no Visual changes: no Palpitations: no Dyspnea: no Chest pain: no Lower extremity edema: no Dizzy/lightheaded: no   Relevant past medical, surgical, family and social history reviewed and updated as indicated. Interim medical history since our last visit reviewed. Allergies and medications reviewed and updated.  Review of Systems  Constitutional:   Negative for activity change, appetite change, diaphoresis, fatigue and fever.  Respiratory:  Negative for cough, chest tightness and shortness of breath.   Cardiovascular:  Negative for chest pain, palpitations and leg swelling.  Gastrointestinal: Negative.   Endocrine: Negative for cold intolerance, heat intolerance, polydipsia, polyphagia and polyuria.  Neurological: Negative.   Psychiatric/Behavioral: Negative.      Per HPI unless specifically indicated above     Objective:    BP (!) 148/80 (BP Location: Left Arm, Patient Position: Sitting)   Pulse 67   Temp 97.9 F (36.6 C) (Oral)   Ht 5' 7.01" (1.702 m)   Wt 233 lb 9.6 oz (106 kg)   SpO2 96%   BMI 36.58 kg/m   Wt Readings from Last 3 Encounters:  07/03/22 233 lb 9.6 oz (106 kg)  06/20/22 225 lb (102.1 kg)  06/12/22 231 lb (104.8 kg)    Physical Exam Vitals and nursing note reviewed.  Constitutional:      General: She is awake. She is not in acute distress.    Appearance: She is well-developed and well-groomed. She is obese. She is not ill-appearing or toxic-appearing.  HENT:     Head: Normocephalic.     Right Ear: Hearing and external ear normal.     Left Ear: Hearing and external ear normal.  Eyes:     General: Lids are normal.        Right eye: No discharge.        Left eye: No discharge.     Conjunctiva/sclera: Conjunctivae  normal.     Pupils: Pupils are equal, round, and reactive to light.  Neck:     Thyroid: No thyromegaly.     Vascular: No carotid bruit.  Cardiovascular:     Rate and Rhythm: Normal rate and regular rhythm.     Heart sounds: Normal heart sounds. No murmur heard.    No gallop.  Pulmonary:     Effort: Pulmonary effort is normal. No accessory muscle usage or respiratory distress.     Breath sounds: Normal breath sounds.  Abdominal:     General: Bowel sounds are normal. There is no distension.     Palpations: Abdomen is soft.     Tenderness: There is no abdominal tenderness.   Musculoskeletal:     Cervical back: Normal range of motion and neck supple.     Right lower leg: No edema.     Left lower leg: No edema.  Lymphadenopathy:     Cervical: No cervical adenopathy.  Skin:    General: Skin is warm and dry.  Neurological:     Mental Status: She is alert and oriented to person, place, and time.  Psychiatric:        Attention and Perception: Attention normal.        Mood and Affect: Mood normal.        Speech: Speech normal.        Behavior: Behavior normal. Behavior is cooperative.        Thought Content: Thought content normal.     Results for orders placed or performed during the hospital encounter of 06/20/22  Surgical pathology  Result Value Ref Range   SURGICAL PATHOLOGY      SURGICAL PATHOLOGY CASE: [id number] PATIENT: [facility] Surgical Pathology Report     Specimen Submitted: A. Colon polyp, transverse; cbx B. Colon polyp, sigmoid; cbx  Clinical History: Personal history of colonic polyps Z86.010. Diverticulosis      DIAGNOSIS:  A. COLON POLYP, TRANSVERSE; BIOPSY: - TUBULAR ADENOMA. - NEGATIVE FOR HIGH-GRADE DYSPLASIA AND MALIGNANCY.  B. COLON POLYP, SIGMOID; BIOPSY: - FRAGMENTS OF INFLAMED GRANULATION TISSUE. - NO COLONIC MUCOSA IDENTIFIED. - NEGATIVE FOR ATYPIA AND MALIGNANCY.   GROSS DESCRIPTION: A. Labeled: Cbx transverse colon polyp Received: Formalin Collection time: 7:40 AM on 06/20/2022 Placed into formalin time: 7:40 AM on 06/20/2022 Tissue fragment(s): 1 Size: 0.3 x 0.2 x 0.1 cm Description: Pink soft tissue fragment Entirely submitted in 1 cassette.  B. Labeled: Cbx sigmoid colon polyp Received: Formalin Collection time: 7:46 AM on 06/20/2022 Placed into formalin time: 7:46 AM on 06/20/2022 Tissue  fragment(s): 2 Size: Ranges from 0.2-0.4 cm Description: Pink soft tissue fragments Entirely submitted in 1 cassette.  CM 06/20/2022  Final Diagnosis performed by Raynelle Bring, MD.   Electronically  signed 06/21/2022 11:15:31AM The electronic signature indicates that the named Attending Pathologist has evaluated the specimen Technical component performed at [facility], [address], [place], [place] [address] Lab: [phone] Dir: Rush Farmer, MD, MMM  Professional component performed at [facility], [facility], [place], [place], [place] [address] Lab: [phone] Dir: Kathi Simpers, MD       Assessment & Plan:   Problem List Items Addressed This Visit       Cardiovascular and Mediastinum   Essential hypertension - Primary    Chronic, ongoing. Above goal on check today.  Increase Valsartan to max dosing 320 MG and continue current Spironolactone and Metoprolol -- is tolerating Spironolactone and seeing less edema.  Monitor K+ level closely,  BMP today.  Continue to monitor BP at home regularly and document + focus on DASH diet.  LABS: BMP.  Return in 4 weeks.  She sees cardiology next week for recheck. - Reached out to oncology via message to discuss Aromasin as have seen trend up on BP with this on board.      Relevant Medications   valsartan (DIOVAN) 320 MG tablet   Other Relevant Orders   Basic metabolic panel     Follow up plan: Return in about 4 weeks (around 07/31/2022) for HTN -- increased Valsartan 320 MG.

## 2022-07-04 LAB — BASIC METABOLIC PANEL
BUN/Creatinine Ratio: 25 (ref 12–28)
BUN: 16 mg/dL (ref 8–27)
CO2: 25 mmol/L (ref 20–29)
Calcium: 10.1 mg/dL (ref 8.7–10.3)
Chloride: 102 mmol/L (ref 96–106)
Creatinine, Ser: 0.65 mg/dL (ref 0.57–1.00)
Glucose: 121 mg/dL — ABNORMAL HIGH (ref 70–99)
Potassium: 3.9 mmol/L (ref 3.5–5.2)
Sodium: 143 mmol/L (ref 134–144)
eGFR: 93 mL/min/{1.73_m2} (ref >59)

## 2022-07-04 NOTE — Progress Notes (Signed)
Contacted via MyChart   Overall labs remain nice and stable.  Great job.  Potassium is stable.

## 2022-07-11 DIAGNOSIS — R0602 Shortness of breath: Secondary | ICD-10-CM | POA: Diagnosis not present

## 2022-07-11 DIAGNOSIS — G4733 Obstructive sleep apnea (adult) (pediatric): Secondary | ICD-10-CM | POA: Diagnosis not present

## 2022-07-11 DIAGNOSIS — I519 Heart disease, unspecified: Secondary | ICD-10-CM | POA: Diagnosis not present

## 2022-07-11 DIAGNOSIS — I10 Essential (primary) hypertension: Secondary | ICD-10-CM | POA: Diagnosis not present

## 2022-07-16 ENCOUNTER — Other Ambulatory Visit: Payer: Medicare Other

## 2022-07-17 ENCOUNTER — Ambulatory Visit (INDEPENDENT_AMBULATORY_CARE_PROVIDER_SITE_OTHER): Payer: Medicare Other | Admitting: Family Medicine

## 2022-07-17 VITALS — BP 136/82 | HR 87 | Temp 98.6°F | Ht 67.01 in | Wt 225.7 lb

## 2022-07-17 DIAGNOSIS — J069 Acute upper respiratory infection, unspecified: Secondary | ICD-10-CM

## 2022-07-17 MED ORDER — BENZONATATE 100 MG PO CAPS: 100.0000 mg | ORAL_CAPSULE | Freq: Two times a day (BID) | ORAL | 0 refills | Status: DC | PRN

## 2022-07-17 MED ORDER — AZELASTINE HCL 0.1 % NA SOLN: 2.0000 | Freq: Two times a day (BID) | NASAL | 12 refills | Status: DC

## 2022-07-17 MED ORDER — FLUTICASONE PROPIONATE 50 MCG/ACT NA SUSP: 2.0000 | Freq: Every day | NASAL | 6 refills | Status: DC

## 2022-07-17 NOTE — Progress Notes (Signed)
BP 136/82   Pulse 87   Temp 98.6 F (37 C) (Oral)   Ht 5' 7.01" (1.702 m)   Wt 225 lb 11.2 oz (102.4 kg)   BMI 35.34 kg/m    Subjective:    Patient ID: Melissa Merritt, female    DOB: Oct 05, 1950, 72 y.o.   MRN: [id number]  HPI: Melissa Merritt is a 72 y.o. female  Chief Complaint  Patient presents with   Cough    Sinus drainage, headache, sore throat, and sob started on Saturday   UPPER RESPIRATORY TRACT INFECTION Started on 3 days ago on Saturday. Vitals are stable today, afebrile. Worst symptom: Cough Fever: no, felt hot last night Cough: yes, productive with thick yellow mucus Shortness of breath: yes Wheezing: no Chest pain: no Chest tightness: no Chest congestion: yes Nasal congestion: yes Runny nose: yes Post nasal drip: yes Sneezing: yes Sore throat: yes Swollen glands: no Sinus pressure: yes Headache: yes Face pain: yes Toothache: yes, tender gums Ear pain: no  Ear pressure: yes bilateral Eyes red/itching:no Eye drainage/crusting: yes  Vomiting: no Rash: no Fatigue: yes Sick contacts: no Strep contacts: no  Context: worse Recurrent sinusitis: no Relief with OTC cold/cough medications: yes, with Robitussin DM Treatments attempted: cold/sinus and cough syrup    Relevant past medical, surgical, family and social history reviewed and updated as indicated. Interim medical history since our last visit reviewed. Allergies and medications reviewed and updated.  Review of Systems  Constitutional:  Negative for chills, fatigue and fever.  HENT:  Positive for congestion, rhinorrhea, sneezing and sore throat. Negative for ear pain, postnasal drip, sinus pressure and sinus pain.   Eyes:  Negative for discharge, redness and itching.  Respiratory:  Positive for cough. Negative for chest tightness, shortness of breath and wheezing.   Cardiovascular:  Negative for chest pain.  Gastrointestinal:  Negative for vomiting.  Skin:  Negative for rash.   Neurological:  Negative for headaches.    Per HPI unless specifically indicated above     Objective:    BP 136/82   Pulse 87   Temp 98.6 F (37 C) (Oral)   Ht 5' 7.01" (1.702 m)   Wt 225 lb 11.2 oz (102.4 kg)   BMI 35.34 kg/m   Wt Readings from Last 3 Encounters:  07/17/22 225 lb 11.2 oz (102.4 kg)  07/03/22 233 lb 9.6 oz (106 kg)  06/20/22 225 lb (102.1 kg)    Physical Exam Vitals and nursing note reviewed.  Constitutional:      General: She is not in acute distress.    Appearance: Normal appearance. She is ill-appearing. She is not toxic-appearing or diaphoretic.  HENT:     Head: Normocephalic and atraumatic.     Right Ear: Tympanic membrane, ear canal and external ear normal. There is no impacted cerumen.     Left Ear: Tympanic membrane, ear canal and external ear normal. There is no impacted cerumen.     Nose: Congestion and rhinorrhea present.     Right Turbinates: Swollen.     Left Turbinates: Swollen.     Comments: Erythematous turbinates    Mouth/Throat:     Mouth: Mucous membranes are moist.     Pharynx: Oropharynx is clear. No oropharyngeal exudate or posterior oropharyngeal erythema.  Eyes:     General: No scleral icterus.       Right eye: Discharge present.        Left eye: Discharge present.    Extraocular Movements: Extraocular  movements intact.     Conjunctiva/sclera: Conjunctivae normal.     Pupils: Pupils are equal, round, and reactive to light.  Neck:     Vascular: No carotid bruit.  Cardiovascular:     Rate and Rhythm: Normal rate and regular rhythm.     Pulses: Normal pulses.     Heart sounds: Normal heart sounds. No murmur heard.    No friction rub. No gallop.  Pulmonary:     Effort: Pulmonary effort is normal. No respiratory distress.     Breath sounds: Normal breath sounds. No stridor. No wheezing, rhonchi or rales.  Chest:     Chest wall: No tenderness.  Abdominal:     General: Bowel sounds are normal.  Musculoskeletal:         General: Normal range of motion.     Cervical back: Normal range of motion and neck supple. No rigidity. No muscular tenderness.  Lymphadenopathy:     Cervical: Cervical adenopathy present.  Skin:    General: Skin is warm and dry.     Capillary Refill: Capillary refill takes less than 2 seconds.     Coloration: Skin is not jaundiced or pale.     Findings: No bruising, erythema, lesion or rash.  Neurological:     General: No focal deficit present.     Mental Status: She is alert and oriented to person, place, and time. Mental status is at baseline.     Cranial Nerves: No cranial nerve deficit.     Sensory: No sensory deficit.     Motor: No weakness.     Coordination: Coordination normal.     Gait: Gait normal.     Deep Tendon Reflexes: Reflexes normal.  Psychiatric:        Mood and Affect: Mood normal.        Behavior: Behavior normal.        Thought Content: Thought content normal.        Judgment: Judgment normal.     Results for orders placed or performed in visit on 07/03/22  Basic metabolic panel  Result Value Ref Range   Glucose 121 (H) 70 - 99 mg/dL   BUN 16 8 - 27 mg/dL   Creatinine, Ser 9.37 0.57 - 1.00 mg/dL   eGFR 93 >90 WI/OXB/3.53   BUN/Creatinine Ratio 25 12 - 28   Sodium 143 134 - 144 mmol/L   Potassium 3.9 3.5 - 5.2 mmol/L   Chloride 102 96 - 106 mmol/L   CO2 25 20 - 29 mmol/L   Calcium 10.1 8.7 - 10.3 mg/dL      Assessment & Plan:   Problem List Items Addressed This Visit   None Visit Diagnoses     Upper respiratory tract infection, unspecified type    -  Primary   Acute, ongoing. Strep/flu negative. Awaiting COVID. Benzonatate 100mg , Astelin, and Flonase BID ordered PRN for symptom management. F/u if symptoms worsen.   Relevant Orders   Veritor Flu A/B Waived   Novel Coronavirus, NAA ([facility])   Rapid Strep screen([facility])        Follow up plan: Return in about 2 weeks (around 07/31/2022), or if symptoms worsen or fail to improve, for  Follow up.

## 2022-07-17 NOTE — Patient Instructions (Addendum)
For Congestion and cough: Coricidin HBP cough and congestion Get plenty of rest Stay hydrated

## 2022-07-19 LAB — NOVEL CORONAVIRUS, NAA: SARS-CoV-2, NAA: NOT DETECTED

## 2022-07-20 LAB — VERITOR FLU A/B WAIVED
Influenza A: NEGATIVE
Influenza B: NEGATIVE

## 2022-07-20 LAB — RAPID STREP SCREEN (MED CTR MEBANE ONLY): Strep Gp A Ag, IA W/Reflex: NEGATIVE

## 2022-07-20 LAB — CULTURE, GROUP A STREP: Strep A Culture: NEGATIVE

## 2022-07-26 ENCOUNTER — Ambulatory Visit (INDEPENDENT_AMBULATORY_CARE_PROVIDER_SITE_OTHER): Payer: Medicare Other | Admitting: Nurse Practitioner

## 2022-07-26 ENCOUNTER — Encounter: Payer: Self-pay | Admitting: Nurse Practitioner

## 2022-07-26 ENCOUNTER — Ambulatory Visit
Admission: RE | Admit: 2022-07-26 | Discharge: 2022-07-26 | Disposition: A | Discharge status: Home / Self Care | Payer: Medicare Other | Source: Ambulatory Visit | Attending: Nurse Practitioner | Admitting: Nurse Practitioner

## 2022-07-26 ENCOUNTER — Ambulatory Visit
Admission: RE | Admit: 2022-07-26 | Discharge: 2022-07-26 | Disposition: A | Discharge status: Home / Self Care | Payer: Medicare Other | Attending: Nurse Practitioner | Admitting: Nurse Practitioner

## 2022-07-26 VITALS — BP 128/70 | HR 92 | Temp 98.5°F | Ht 67.01 in | Wt 225.1 lb

## 2022-07-26 DIAGNOSIS — R052 Subacute cough: Secondary | ICD-10-CM | POA: Insufficient documentation

## 2022-07-26 DIAGNOSIS — J4489 Other specified chronic obstructive pulmonary disease: Secondary | ICD-10-CM | POA: Diagnosis not present

## 2022-07-26 DIAGNOSIS — J9811 Atelectasis: Secondary | ICD-10-CM | POA: Diagnosis not present

## 2022-07-26 DIAGNOSIS — R059 Cough, unspecified: Secondary | ICD-10-CM | POA: Diagnosis not present

## 2022-07-26 MED ORDER — IPRATROPIUM-ALBUTEROL 0.5-2.5 (3) MG/3ML IN SOLN
3.0000 mL | Freq: Once | RESPIRATORY_TRACT | Status: AC
  Administered 2022-07-26: 3 mL via RESPIRATORY_TRACT

## 2022-07-26 MED ORDER — GUAIFENESIN-CODEINE 100-10 MG/5ML PO SOLN: 5.0000 mL | Freq: Two times a day (BID) | ORAL | 0 refills | Status: DC | PRN

## 2022-07-26 MED ORDER — AZITHROMYCIN 250 MG PO TABS: ORAL_TABLET | ORAL | 0 refills | Status: DC

## 2022-07-26 MED ORDER — ALBUTEROL SULFATE HFA 108 (90 BASE) MCG/ACT IN AERS: 2.0000 | INHALATION_SPRAY | Freq: Four times a day (QID) | RESPIRATORY_TRACT | 3 refills | Status: DC | PRN

## 2022-07-26 MED ORDER — PREDNISONE 20 MG PO TABS: 40.0000 mg | ORAL_TABLET | Freq: Every day | ORAL | 0 refills | Status: DC

## 2022-07-26 NOTE — Patient Instructions (Signed)
Cough, Adult A cough helps to clear your throat and lungs. It may be a sign of an illness or another condition. A short-term (acute) cough may last 2-3 weeks. A long-term (chronic) cough may last 8 or more weeks. Many things can cause a cough. They include: Illnesses such as: An infection in your throat or lungs. Asthma or other heart or lung problems. Gastroesophageal reflux. This is when acid comes back up from your stomach. Breathing in things that bother (irritate) your lungs. Allergies. Postnasal drip. This is when mucus runs down the back of your throat. Smoking. Some medicines. Follow these instructions at home: Medicines Take over-the-counter and prescription medicines only as told by your doctor. Talk with your doctor before you take cough medicine (cough suppressants). Eating and drinking Do not drink alcohol. Do not drink caffeine. Drink enough fluid to keep your pee (urine) pale yellow. Lifestyle Stay away from cigarette smoke. Do not smoke or use any products that contain nicotine or tobacco. If you need help quitting, ask your doctor. Stay away from things that make you cough. These may include perfume, candles, cleaning products, or campfire smoke. General instructions  Watch for any changes to your cough. Tell your doctor about them. Always cover your mouth when you cough. If the air is dry in your home, use a cool mist vaporizer or humidifier. If your cough is worse at night, try using extra pillows to raise your head up higher while you sleep. Rest as needed. Contact a doctor if: You have new symptoms. Your symptoms get worse. You cough up pus. You have a fever that does not go away. Your cough does not get better after 2-3 weeks. Cough medicine does not help, and you are not sleeping well. You have pain that gets worse or is not helped with medicine. You are losing weight and do not know why. You have night sweats. Get help right away if: You cough up  blood. You have trouble breathing. Your heart is beating very fast. These symptoms may be an emergency. Get help right away. Call 911. Do not wait to see if the symptoms will go away. Do not drive yourself to the hospital. This information is not intended to replace advice given to you by your health care provider. Make sure you discuss any questions you have with your health care provider. Document Revised: 11/24/2021 Document Reviewed: 11/24/2021 Elsevier Patient Education  2023 Elsevier Inc.  

## 2022-07-26 NOTE — Progress Notes (Signed)
BP 128/70 (BP Location: Left Arm, Patient Position: Sitting, Cuff Size: Large)   Pulse 92   Temp 98.5 F (36.9 C) (Oral)   Ht 5' 7.01" (1.702 m)   Wt 225 lb 1.6 oz (102.1 kg)   SpO2 95%   BMI 35.25 kg/m    Subjective:    Patient ID: Melissa Merritt, female    DOB: [birth year], 72 y.o.   MRN: [id number]  HPI: Melissa Merritt is a 71 y.o. female  Chief Complaint  Patient presents with   Cough    Cough has now become productive,headache, and chest is feeling a little tight.    UPPER RESPIRATORY TRACT INFECTION Was seen on 07/17/22 for this and treated with Tessalon and Flonase.  All testing at time was negative, including Covid.  Cough continues with no improvement. She feels she has hurt a rib with coughing so much.  Has underlying COPD/Asthma, uses Trelegy at home. Fever: achy at night, but has not taken temperature Cough: yes Shortness of breath: yes Wheezing: yes Chest pain: no Chest tightness: yes Chest congestion: yes Nasal congestion: yes Runny nose: yes Post nasal drip: yes Sneezing: no Sore throat: no Swollen glands: no Sinus pressure: no Headache: yes Face pain: no Toothache: no Ear pain: none Ear pressure: none Eyes red/itching:no Eye drainage/crusting: no  Vomiting: no Rash: no Fatigue: yes Sick contacts: no Strep contacts: no  Context: worse Recurrent sinusitis: no Relief with OTC cold/cough medications: no  Treatments attempted: Tessalon, Flonase    Relevant past medical, surgical, family and social history reviewed and updated as indicated. Interim medical history since our last visit reviewed. Allergies and medications reviewed and updated.  Review of Systems  Constitutional:  Positive for chills and fatigue. Negative for activity change, appetite change and fever.  HENT:  Positive for congestion, postnasal drip and rhinorrhea. Negative for ear discharge, ear pain, facial swelling, sinus pressure, sinus pain, sneezing, sore throat and voice  change.   Eyes:  Negative for pain and visual disturbance.  Respiratory:  Positive for cough, chest tightness, shortness of breath and wheezing.   Cardiovascular:  Negative for chest pain, palpitations and leg swelling.  Gastrointestinal: Negative.   Neurological:  Positive for headaches. Negative for dizziness and numbness.  Psychiatric/Behavioral: Negative.     Per HPI unless specifically indicated above     Objective:    BP 128/70 (BP Location: Left Arm, Patient Position: Sitting, Cuff Size: Large)   Pulse 92   Temp 98.5 F (36.9 C) (Oral)   Ht 5' 7.01" (1.702 m)   Wt 225 lb 1.6 oz (102.1 kg)   SpO2 95%   BMI 35.25 kg/m   Wt Readings from Last 3 Encounters:  07/26/22 225 lb 1.6 oz (102.1 kg)  07/17/22 225 lb 11.2 oz (102.4 kg)  07/03/22 233 lb 9.6 oz (106 kg)    Physical Exam Vitals and nursing note reviewed.  Constitutional:      General: She is awake. She is not in acute distress.    Appearance: She is well-developed and well-groomed. She is obese. She is not ill-appearing or toxic-appearing.  HENT:     Head: Normocephalic.     Right Ear: Hearing, ear canal and external ear normal. A middle ear effusion is present. There is no impacted cerumen. Tympanic membrane is not injected.     Left Ear: Hearing, ear canal and external ear normal. A middle ear effusion is present. There is no impacted cerumen. Tympanic membrane is not injected.  Nose: Rhinorrhea present. Rhinorrhea is clear.     Right Sinus: No maxillary sinus tenderness or frontal sinus tenderness.     Left Sinus: No maxillary sinus tenderness or frontal sinus tenderness.     Mouth/Throat:     Mouth: Mucous membranes are moist.     Pharynx: Posterior oropharyngeal erythema (mild) present. No pharyngeal swelling or oropharyngeal exudate.  Eyes:     General: Lids are normal.        Right eye: No discharge.        Left eye: No discharge.     Conjunctiva/sclera: Conjunctivae normal.     Pupils: Pupils are  equal, round, and reactive to light.  Neck:     Thyroid: No thyromegaly.     Vascular: No carotid bruit.  Cardiovascular:     Rate and Rhythm: Normal rate and regular rhythm.     Heart sounds: Normal heart sounds. No murmur heard.    No gallop.  Pulmonary:     Effort: Pulmonary effort is normal. No accessory muscle usage or respiratory distress.     Breath sounds: Wheezing and rhonchi present. No decreased breath sounds.     Comments: Expiratory wheezes noted throughout.  Rhonchi present to lower bases R>L.  Clearing some with nebulizer. Abdominal:     General: Bowel sounds are normal.     Palpations: Abdomen is soft.  Musculoskeletal:     Cervical back: Normal range of motion and neck supple.     Right lower leg: No edema.     Left lower leg: No edema.  Lymphadenopathy:     Cervical: No cervical adenopathy.  Skin:    General: Skin is warm and dry.  Neurological:     Mental Status: She is alert and oriented to person, place, and time.  Psychiatric:        Attention and Perception: Attention normal.        Mood and Affect: Mood normal.        Speech: Speech normal.        Behavior: Behavior normal. Behavior is cooperative.        Thought Content: Thought content normal.    Results for orders placed or performed in visit on 07/17/22  Novel Coronavirus, NAA ([facility])   Specimen: Nasopharyngeal(NP) swabs in vial transport medium  Result Value Ref Range   SARS-CoV-2, NAA Not Detected Not Detected  Rapid Strep screen([facility])   Specimen: Other   Other  Result Value Ref Range   Strep Gp A Ag, IA W/Reflex Negative Negative  Culture, Group A Strep   Other  Result Value Ref Range   Strep A Culture Negative   Veritor Flu A/B Waived  Result Value Ref Range   Influenza A Negative Negative   Influenza B Negative Negative      Assessment & Plan:   Problem List Items Addressed This Visit       Respiratory   Asthma-COPD overlap syndrome - Primary    Chronic, stable  with Trelegy, however current cough present.  Continue collaboration with pulmonary and current medications as prescribed by them.  Recent note reviewed.      Relevant Medications   albuterol (VENTOLIN HFA) 108 (90 Base) MCG/ACT inhaler   predniSONE (DELTASONE) 20 MG tablet   azithromycin (ZITHROMAX) 250 MG tablet   guaiFENesin-codeine 100-10 MG/5ML syrup   Other Relevant Orders   DG Chest 2 View     Other   Subacute cough    Ongoing since prior to 07/17/22,  no improvement with simple treatment.  At this time concern for PNA, will obtain imaging.  Start Zpack + Prednisone 40 MG daily for 5 days.  Continue Tessalon at home and OTC medications for cough relief.  Albuterol inhaler sent in to use as needed. Duoneb in office today.  If PNA present will add on Doxycycline to regimen.  Guaifenesin-codeine sent in for night time.  Recommend: - Increased rest - Increasing Fluids - Acetaminophen as needed for fever/pain.  - Salt water gargling, chloraseptic spray and throat lozenges - OTC Coricidin - Mucinex.  - Humidifying the air.  Return as scheduled on Tuesday next week for lung check.      Relevant Orders   DG Chest 2 View     Follow up plan: Return for as scheduled 07/31/22.

## 2022-07-26 NOTE — Assessment & Plan Note (Signed)
Chronic, stable with Trelegy, however current cough present.  Continue collaboration with pulmonary and current medications as prescribed by them.  Recent note reviewed.

## 2022-07-26 NOTE — Assessment & Plan Note (Addendum)
Ongoing since prior to 07/17/22, no improvement with simple treatment.  At this time concern for PNA, will obtain imaging.  Start Zpack + Prednisone 40 MG daily for 5 days.  Continue Tessalon at home and OTC medications for cough relief.  Albuterol inhaler sent in to use as needed. Duoneb in office today.  If PNA present will add on Doxycycline to regimen.  Guaifenesin-codeine sent in for night time.  Recommend: - Increased rest - Increasing Fluids - Acetaminophen as needed for fever/pain.  - Salt water gargling, chloraseptic spray and throat lozenges - OTC Coricidin - Mucinex.  - Humidifying the air.  Return as scheduled on Tuesday next week for lung check.

## 2022-07-29 NOTE — Patient Instructions (Signed)

## 2022-07-30 ENCOUNTER — Ambulatory Visit
Admission: RE | Admit: 2022-07-30 | Discharge: 2022-07-30 | Disposition: A | Discharge status: Home / Self Care | Payer: Medicare Other | Source: Ambulatory Visit | Attending: Medical Oncology | Admitting: Medical Oncology

## 2022-07-30 DIAGNOSIS — Z9889 Other specified postprocedural states: Secondary | ICD-10-CM

## 2022-07-30 DIAGNOSIS — M8588 Other specified disorders of bone density and structure, other site: Secondary | ICD-10-CM

## 2022-07-30 DIAGNOSIS — Z1231 Encounter for screening mammogram for malignant neoplasm of breast: Secondary | ICD-10-CM | POA: Insufficient documentation

## 2022-07-30 NOTE — Progress Notes (Signed)
Contacted via MyChart   Good morning Melissa Merritt, your chest imaging returned and no pneumonia noted.  There is some atelectasis -- meaning you need to open lungs a little more -- do some deep breathing exercises at home, especially while sick to help get those lungs nice and open.  Any questions?

## 2022-07-31 ENCOUNTER — Ambulatory Visit (INDEPENDENT_AMBULATORY_CARE_PROVIDER_SITE_OTHER): Payer: Medicare Other | Admitting: Nurse Practitioner

## 2022-07-31 ENCOUNTER — Encounter: Payer: Self-pay | Admitting: Nurse Practitioner

## 2022-07-31 VITALS — BP 140/78 | HR 69 | Temp 97.7°F | Ht 67.01 in | Wt 230.3 lb

## 2022-07-31 DIAGNOSIS — R052 Subacute cough: Secondary | ICD-10-CM | POA: Diagnosis not present

## 2022-07-31 DIAGNOSIS — I10 Essential (primary) hypertension: Secondary | ICD-10-CM

## 2022-07-31 DIAGNOSIS — J4489 Other specified chronic obstructive pulmonary disease: Secondary | ICD-10-CM

## 2022-07-31 MED ORDER — AMLODIPINE BESYLATE 5 MG PO TABS: 5.0000 mg | ORAL_TABLET | Freq: Every day | ORAL | 4 refills | Status: DC

## 2022-07-31 NOTE — Assessment & Plan Note (Signed)
Chronic, stable with Trelegy, recent infection improved.  Continue collaboration with pulmonary and current medications as prescribed by them.  Recent note reviewed.

## 2022-07-31 NOTE — Assessment & Plan Note (Signed)
Acute and improved at this time, no further treatment needed.

## 2022-07-31 NOTE — Progress Notes (Signed)
BP (!) 140/78 (BP Location: Left Arm, Patient Position: Sitting, Cuff Size: Large)   Pulse 69   Temp 97.7 F (36.5 C) (Oral)   Ht 5' 7.01" (1.702 m)   Wt 230 lb 4.8 oz (104.5 kg)   SpO2 95%   BMI 36.06 kg/m    Subjective:    Patient ID: Melissa Merritt, female    DOB: 10-13-1950, 72 y.o.   MRN: [id number]  HPI: Melissa Merritt is a 72 y.o. female  Chief Complaint  Patient presents with   Hypertension    Increased Valsartan at last visit   HYPERTENSION  At visit on 07/03/22 PCP increase Valsartan dosing.  We had tried increasing Amlodipine in past, but this caused edema.  She went to cardiology on 06/06/22, they stopped Amlodipine and started Spironolactone due to swelling.  Did tolerate 5 MG Amlodipine in past. Performed echo on 06/15/22 with LVEF 50% and normal LV systolic function and right systolic function with mild LVH - similar to echo on 08/24/20.  Takes Aromasin for history of breast cancer, has been on for 18 months.   Currently taking Spironolactone, Valsartan, and Metoprolol for blood pressure.     In past took ACE inhibitor which made her feel strange and HCTZ caused muscle aches. Hypertension status: controlled  Satisfied with current treatment? yes Duration of hypertension: chronic BP monitoring frequency:  daily BP range: 150-160/80 range BP medication side effects:  no Medication compliance: good compliance Aspirin: no Recurrent headaches: no Visual changes: no Palpitations: no Dyspnea:  recent URI, occasional Chest pain: no Lower extremity edema: no Dizzy/lightheaded: no   ASTHMA Treated on 07/26/22 with abx therapy and Prednisone due to ongoing cough -- completed course yesterday.  Was seen on 07/17/22 for this and treated with Tessalon and Flonase. All testing at time was negative, including Covid. Has underlying COPD/Asthma, uses Trelegy at home.  Asthma status: improving Satisfied with current treatment?: yes Albuterol/rescue inhaler frequency: more  often while sick Dyspnea frequency: occasional Wheezing frequency: none since Sunday Cough frequency: improving, non productive Nocturnal symptom frequency: none Limitation of activity: no Current upper respiratory symptoms: yes Aerochamber/spacer use: no Visits to ER or Urgent Care in past year: no Pneumovax: Up to Date Influenza: Up to Date   Relevant past medical, surgical, family and social history reviewed and updated as indicated. Interim medical history since our last visit reviewed. Allergies and medications reviewed and updated.  Review of Systems  Constitutional:  Negative for activity change, appetite change, diaphoresis, fatigue and fever.  Respiratory:  Positive for cough (improving). Negative for chest tightness, shortness of breath and wheezing.   Cardiovascular:  Negative for chest pain, palpitations and leg swelling.  Gastrointestinal: Negative.   Endocrine: Negative for cold intolerance, heat intolerance, polydipsia, polyphagia and polyuria.  Neurological: Negative.   Psychiatric/Behavioral: Negative.      Per HPI unless specifically indicated above     Objective:    BP (!) 140/78 (BP Location: Left Arm, Patient Position: Sitting, Cuff Size: Large)   Pulse 69   Temp 97.7 F (36.5 C) (Oral)   Ht 5' 7.01" (1.702 m)   Wt 230 lb 4.8 oz (104.5 kg)   SpO2 95%   BMI 36.06 kg/m   Wt Readings from Last 3 Encounters:  07/31/22 230 lb 4.8 oz (104.5 kg)  07/26/22 225 lb 1.6 oz (102.1 kg)  07/17/22 225 lb 11.2 oz (102.4 kg)    Physical Exam Vitals and nursing note reviewed.  Constitutional:  General: She is awake. She is not in acute distress.    Appearance: She is well-developed and well-groomed. She is obese. She is not ill-appearing or toxic-appearing.  HENT:     Head: Normocephalic.     Right Ear: Hearing and external ear normal.     Left Ear: Hearing and external ear normal.  Eyes:     General: Lids are normal.        Right eye: No discharge.         Left eye: No discharge.     Conjunctiva/sclera: Conjunctivae normal.     Pupils: Pupils are equal, round, and reactive to light.  Neck:     Thyroid: No thyromegaly.     Vascular: No carotid bruit.  Cardiovascular:     Rate and Rhythm: Normal rate and regular rhythm.     Heart sounds: Normal heart sounds. No murmur heard.    No gallop.  Pulmonary:     Effort: Pulmonary effort is normal. No accessory muscle usage or respiratory distress.     Breath sounds: Normal breath sounds.  Abdominal:     General: Bowel sounds are normal. There is no distension.     Palpations: Abdomen is soft.     Tenderness: There is no abdominal tenderness.  Musculoskeletal:     Cervical back: Normal range of motion and neck supple.     Right lower leg: No edema.     Left lower leg: No edema.  Lymphadenopathy:     Cervical: No cervical adenopathy.  Skin:    General: Skin is warm and dry.  Neurological:     Mental Status: She is alert and oriented to person, place, and time.  Psychiatric:        Attention and Perception: Attention normal.        Mood and Affect: Mood normal.        Speech: Speech normal.        Behavior: Behavior normal. Behavior is cooperative.        Thought Content: Thought content normal.    Results for orders placed or performed in visit on 07/17/22  Novel Coronavirus, NAA ([facility])   Specimen: Nasopharyngeal(NP) swabs in vial transport medium  Result Value Ref Range   SARS-CoV-2, NAA Not Detected Not Detected  Rapid Strep screen([facility])   Specimen: Other   Other  Result Value Ref Range   Strep Gp A Ag, IA W/Reflex Negative Negative  Culture, Group A Strep   Other  Result Value Ref Range   Strep A Culture Negative   Veritor Flu A/B Waived  Result Value Ref Range   Influenza A Negative Negative   Influenza B Negative Negative      Assessment & Plan:   Problem List Items Addressed This Visit       Cardiovascular and Mediastinum   Essential hypertension  - Primary    Chronic, ongoing. Above goal on check today.  Continue Valsartan max dosing 320 MG and continue current Spironolactone and Metoprolol -- is tolerating Spironolactone and seeing less edema.  Add back on Amlodipine at 5 MG, no swelling in past with this.  Monitor K+ level closely, BMP today.  Continue to monitor BP at home regularly and document + focus on DASH diet.  LABS: BMP.  Return in 4 weeks.  Has had trend up in BP with Aromasin on board.      Relevant Medications   amLODipine (NORVASC) 5 MG tablet   Other Relevant Orders   Basic  metabolic panel     Respiratory   Asthma-COPD overlap syndrome    Chronic, stable with Trelegy, recent infection improved.  Continue collaboration with pulmonary and current medications as prescribed by them.  Recent note reviewed.        Other   Subacute cough    Acute and improved at this time, no further treatment needed.        Follow up plan: Return in about 4 weeks (around 08/28/2022) for HTN -- added back on Amlodipine.

## 2022-07-31 NOTE — Assessment & Plan Note (Signed)
Chronic, ongoing. Above goal on check today.  Continue Valsartan max dosing 320 MG and continue current Spironolactone and Metoprolol -- is tolerating Spironolactone and seeing less edema.  Add back on Amlodipine at 5 MG, no swelling in past with this.  Monitor K+ level closely, BMP today.  Continue to monitor BP at home regularly and document + focus on DASH diet.  LABS: BMP.  Return in 4 weeks.  Has had trend up in BP with Aromasin on board.

## 2022-08-01 LAB — BASIC METABOLIC PANEL
BUN/Creatinine Ratio: 18 (ref 12–28)
BUN: 15 mg/dL (ref 8–27)
CO2: 27 mmol/L (ref 20–29)
Calcium: 9.6 mg/dL (ref 8.7–10.3)
Chloride: 98 mmol/L (ref 96–106)
Creatinine, Ser: 0.82 mg/dL (ref 0.57–1.00)
Glucose: 92 mg/dL (ref 70–99)
Potassium: 3.6 mmol/L (ref 3.5–5.2)
Sodium: 141 mmol/L (ref 134–144)
eGFR: 75 mL/min/{1.73_m2} (ref >59)

## 2022-08-01 NOTE — Progress Notes (Signed)
Contacted via MyChart   Good morning Melissa Merritt, your kidney function and electrolytes remain stable.  Great news!! Keep being amazing!!  Thank you for allowing me to participate in your care.  I appreciate you. Kindest regards, Rolinda Impson

## 2022-08-03 ENCOUNTER — Ambulatory Visit: Payer: Self-pay

## 2022-08-03 ENCOUNTER — Telehealth: Payer: Self-pay | Admitting: Nurse Practitioner

## 2022-08-03 MED ORDER — NYSTATIN 100000 UNIT/ML MT SUSP: 5.0000 mL | Freq: Four times a day (QID) | OROMUCOSAL | 0 refills | Status: DC

## 2022-08-03 NOTE — Telephone Encounter (Signed)
  Pt is calling because she has thrush was seen in office on 07/31/22. Pleae advise [phone] [facility]- Transport planner Complaint: Yellow coating on tongue, sore throat. "I've had thrush before many years ago."Request medication. Symptoms: Above Frequency: Last night Pertinent Negatives: Patient denies  Disposition: [] ED /[] Urgent Care (no appt availability in office) / [] Appointment(In office/virtual)/ []  [facility]/ [] Home Care/ [] Refused Recommended Disposition /[] [place] [facility]/ [x]  Follow-up with PCP Additional Notes: Please advise pt.  Answer Assessment - Initial Assessment Questions 1. SYMPTOM: "What's the main symptom you're concerned about?" (e.g., chapped lips, dry mouth, lump, sores)     Coating yellow on tongue  2. ONSET: "When did the    start?"     Last night 3. PAIN: "Is there any pain?" If Yes, ask: "How bad is it?" (Scale: 1-10; mild, moderate, severe)   - MILD (1-3):  doesn't interfere with eating or normal activities   - MODERATE (4-7): interferes with eating some solids and normal activities   - SEVERE (8-10):  excruciating pain, interferes with most normal activities   - SEVERE DYSPHAGIA: can't swallow liquids, drooling     Mild  4. CAUSE: "What do you think is causing the symptoms?"     Thrush 5. OTHER SYMPTOMS: "Do you have any other symptoms?" (e.g., fever, sore throat, toothache, swelling)     Sore throat 6. PREGNANCY: "Is there any chance you are pregnant?" "When was your last menstrual period?"     No  Protocols used: Mouth Symptoms-A-AH

## 2022-08-03 NOTE — Telephone Encounter (Signed)
Pt is calling because she has thrush was seen in office on 07/31/22. Pleae advise [phone] Preferred [facility]- [employer]

## 2022-08-03 NOTE — Telephone Encounter (Signed)
Sent to [facility] NT to call and triage the patient's symptoms of thrush.

## 2022-08-03 NOTE — Telephone Encounter (Signed)
Patient notified that prescription has been sent in.

## 2022-08-15 ENCOUNTER — Other Ambulatory Visit: Payer: Self-pay | Admitting: *Deleted

## 2022-08-15 DIAGNOSIS — Z170 Estrogen receptor positive status [ER+]: Secondary | ICD-10-CM

## 2022-08-16 ENCOUNTER — Inpatient Hospital Stay: Payer: Medicare Other

## 2022-08-16 ENCOUNTER — Inpatient Hospital Stay (HOSPITAL_BASED_OUTPATIENT_CLINIC_OR_DEPARTMENT_OTHER): Payer: Medicare Other | Admitting: Internal Medicine

## 2022-08-16 ENCOUNTER — Inpatient Hospital Stay: Payer: Medicare Other | Attending: Internal Medicine

## 2022-08-16 ENCOUNTER — Encounter: Payer: Self-pay | Admitting: Internal Medicine

## 2022-08-16 VITALS — BP 143/77 | HR 72 | Temp 98.4°F | Resp 18 | Ht 67.01 in | Wt 226.0 lb

## 2022-08-16 DIAGNOSIS — I110 Hypertensive heart disease with heart failure: Secondary | ICD-10-CM | POA: Diagnosis not present

## 2022-08-16 DIAGNOSIS — R232 Flushing: Secondary | ICD-10-CM | POA: Diagnosis not present

## 2022-08-16 DIAGNOSIS — Z923 Personal history of irradiation: Secondary | ICD-10-CM | POA: Insufficient documentation

## 2022-08-16 DIAGNOSIS — Z803 Family history of malignant neoplasm of breast: Secondary | ICD-10-CM | POA: Insufficient documentation

## 2022-08-16 DIAGNOSIS — I509 Heart failure, unspecified: Secondary | ICD-10-CM | POA: Diagnosis not present

## 2022-08-16 DIAGNOSIS — R0600 Dyspnea, unspecified: Secondary | ICD-10-CM | POA: Insufficient documentation

## 2022-08-16 DIAGNOSIS — M8580 Other specified disorders of bone density and structure, unspecified site: Secondary | ICD-10-CM | POA: Diagnosis not present

## 2022-08-16 DIAGNOSIS — Z87891 Personal history of nicotine dependence: Secondary | ICD-10-CM | POA: Insufficient documentation

## 2022-08-16 DIAGNOSIS — C50811 Malignant neoplasm of overlapping sites of right female breast: Secondary | ICD-10-CM

## 2022-08-16 DIAGNOSIS — Z79811 Long term (current) use of aromatase inhibitors: Secondary | ICD-10-CM | POA: Diagnosis not present

## 2022-08-16 DIAGNOSIS — Z801 Family history of malignant neoplasm of trachea, bronchus and lung: Secondary | ICD-10-CM | POA: Insufficient documentation

## 2022-08-16 DIAGNOSIS — Z170 Estrogen receptor positive status [ER+]: Secondary | ICD-10-CM | POA: Insufficient documentation

## 2022-08-16 DIAGNOSIS — C50911 Malignant neoplasm of unspecified site of right female breast: Secondary | ICD-10-CM | POA: Diagnosis not present

## 2022-08-16 DIAGNOSIS — D509 Iron deficiency anemia, unspecified: Secondary | ICD-10-CM | POA: Diagnosis not present

## 2022-08-16 DIAGNOSIS — M8588 Other specified disorders of bone density and structure, other site: Secondary | ICD-10-CM

## 2022-08-16 DIAGNOSIS — E538 Deficiency of other specified B group vitamins: Secondary | ICD-10-CM | POA: Insufficient documentation

## 2022-08-16 LAB — CMP (CANCER CENTER ONLY)
ALT: 18 U/L (ref 0–44)
AST: 24 U/L (ref 15–41)
Albumin: 4.1 g/dL (ref 3.5–5.0)
Alkaline Phosphatase: 69 U/L (ref 38–126)
Anion gap: 12 (ref 5–15)
BUN: 19 mg/dL (ref 8–23)
CO2: 26 mmol/L (ref 22–32)
Calcium: 9.9 mg/dL (ref 8.9–10.3)
Chloride: 99 mmol/L (ref 98–111)
Creatinine: 1.19 mg/dL — ABNORMAL HIGH (ref 0.44–1.00)
GFR, Estimated: 48 mL/min — ABNORMAL LOW (ref >60)
Glucose, Bld: 170 mg/dL — ABNORMAL HIGH (ref 70–99)
Potassium: 4.3 mmol/L (ref 3.5–5.1)
Sodium: 137 mmol/L (ref 135–145)
Total Bilirubin: 0.7 mg/dL (ref 0.3–1.2)
Total Protein: 7.5 g/dL (ref 6.5–8.1)

## 2022-08-16 LAB — CBC WITH DIFFERENTIAL (CANCER CENTER ONLY)
Abs Immature Granulocytes: 0.15 10*3/uL — ABNORMAL HIGH (ref 0.00–0.07)
Basophils Absolute: 0.1 10*3/uL (ref 0.0–0.1)
Basophils Relative: 1 %
Eosinophils Absolute: 0.4 10*3/uL (ref 0.0–0.5)
Eosinophils Relative: 6 %
HCT: 41.7 % (ref 36.0–46.0)
Hemoglobin: 13.6 g/dL (ref 12.0–15.0)
Immature Granulocytes: 3 %
Lymphocytes Relative: 20 %
Lymphs Abs: 1.2 10*3/uL (ref 0.7–4.0)
MCH: 30.1 pg (ref 26.0–34.0)
MCHC: 32.6 g/dL (ref 30.0–36.0)
MCV: 92.3 fL (ref 80.0–100.0)
Monocytes Absolute: 0.6 10*3/uL (ref 0.1–1.0)
Monocytes Relative: 10 %
Neutro Abs: 3.7 10*3/uL (ref 1.7–7.7)
Neutrophils Relative %: 60 %
Platelet Count: 241 10*3/uL (ref 150–400)
RBC: 4.52 MIL/uL (ref 3.87–5.11)
RDW: 13.6 % (ref 11.5–15.5)
WBC Count: 6 10*3/uL (ref 4.0–10.5)
nRBC: 0 % (ref 0.0–0.2)

## 2022-08-16 MED ORDER — DENOSUMAB 60 MG/ML ~~LOC~~ SOSY
60.0000 mg | PREFILLED_SYRINGE | Freq: Once | SUBCUTANEOUS | Status: AC
  Administered 2022-08-16: 60 mg via SUBCUTANEOUS

## 2022-08-16 MED ORDER — EXEMESTANE 25 MG PO TABS: 25.0000 mg | ORAL_TABLET | Freq: Every day | ORAL | 1 refills | Status: DC

## 2022-08-16 NOTE — Progress Notes (Signed)
I connected with [name] on 08/16/22 at 10:45 AM EDT by video enabled telemedicine visit and verified that I am speaking with the correct person using two identifiers.  I discussed the limitations, risks, security and privacy concerns of performing an evaluation and management service by telemedicine and the availability of in-person appointments. I also discussed with the patient that there may be a patient responsible charge related to this service. The patient expressed understanding and agreed to proceed.    Other persons participating in the visit and their role in the encounter: RN/medical reconciliation Patient's location: office Provider's location: home  Oncology History Overview Note  Bilateral diagnostic mammogram on 07/08/2019 revealed persistent architectural distortion involving the upper outer quadrant of the right breast without sonographic correlate.  There was no pathologic right axillary lymphadenopathy.  No mammographic or sonographic evidence of malignancy in the left breast.  CA27.29 was 19.0 (07/24/19) at diagnosis.   Patient underwent stereotactic biopsy on 07/17/2019.  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  Intermediate nuclear grade with central necrosis.  Consistent with stage Ia right breast cancer.  She underwent partial mastectomy and right axillary sentinel lymph node biopsy on 08/13/2019.  Pathology revealed grade 1 invasive mammary carcinoma, no special type, and intermediate grade DCIS.  There is atypical lobular hyperplasia with ductal involvement.  Additional posterior margin revealed no evidence of residual carcinoma or DCIS.  Closest margin was > 10 mm and 2 mm for DCIS.  1 lymph node was negative for malignancy.  Tumor was ER/PR positive and HER-2/neu negative.  Pathologic stage was pT1a pN0 (sn).   She received 5040 cGy to her right breast and 1400 cGy boost to her scar from 09/28/2019-11/17/2019.  She began tamoxifen on  11/27/2019.  Tamoxifen discontinued in December 2021 due to intolerable side effects.  She was switched to Femara, started 04/12/20.  stopped in 9th May 2022 sec to leg swelling.  # AUG 2022- STARTED AROMASIN  Baseline bone density scan in April 2021 consistent with osteopenia with T score of -1.5. Plan for prolia.    History of breast cancer  07/24/2019 Initial Diagnosis   Malignant neoplasm of right breast in female, estrogen receptor positive (HCC)   Carcinoma of overlapping sites of right breast in female, estrogen receptor positive (HCC)  02/14/2021 Initial Diagnosis   Carcinoma of overlapping sites of right breast in female, estrogen receptor positive [facility])    Chief Complaint: Breast cancer on adjuvant therapy.   History of present illness:Melissa Merritt 72 y.o.  female with history of stage I ER/PR positive breast cancer on adjuvant Aromasin is here for follow-up/review results of the mammogram.  Pt has had 2 URI's and the noro virus. She is feeling better now.   Ribs still tender on right side from all the coughing. Still has lingering dyspnea. Denies any breast pain  Appetite is good.   Hot flashes from exemestane. Had mammogram in April.   Observation/objective: Alert & oriented x 3. In No acute distress.   Assessment and plan: Carcinoma of overlapping sites of right breast in female, estrogen receptor positive (HCC) #2021-Stage Ia right breast cancer status postlumpectomy;  ER/PR positive, HER2/neu negative. s/p adjuvant radiation therapy on 11/17/2019.  Currently on Aromasin [Fall of 2026]. APRIL 2024 -abnormal mammogram recent breast biopsy-negative for any malignancy.  Stable.   # Patient currently on Aromasin-tolerating well.  No worsening joint pains.  No worsening hot flashes.    # Hot flashes- G-2-second Aromasin.  Discussed regarding pharmacotherapy.  Patient declined stating "do not want to try 1 more pill"-monitor for now.  #  Osteopenia-bone density scan in April  2021 consistent with osteopenia.   Continue calcium 1200 mg and vitamin D 1000 IU daily along with weightbearing exercise as tolerated.  Plan to repeat bone density in April 2025.  will order at next visit.  Continue. prolia today.    #   Iron deficiency anemia-history of iron deficiency anemia.  On oral iron.  Not anemic on most recent blood work. 2023-Oct Iron sat 12%; ferritin-wnl.   Continue oral iron in the interim- stable.   # BG-1 pepsi [BG- 170]; slightly elevated creatinine 1.1 GFR 49.  Recently on prednisone for URI.  Also discussed regarding cutting down on sodas and also healthy weight loss.   # B12 deficiency.  History of B12 deficiency; NOV 2023- > 2000-  recommend on oral B12 3 times a week- stable.               # DISPOSITION: # Prolia today-  # follow up- in 6 months- MD; cbc/cmp; ca27-29; b12; prolia- Dr.B     Follow-up instructions:  I discussed the assessment and treatment plan with the patient.  The patient was provided an opportunity to ask questions and all were answered.  The patient agreed with the plan and demonstrated understanding of instructions.  The patient was advised to call back or seek an in person evaluation if the symptoms worsen or if the condition fails to improve as anticipated.   Dr. Louretta Shorten [facility] at [facility] 08/16/2022 11:16 AM

## 2022-08-16 NOTE — Addendum Note (Signed)
Addended by: Clydia Llano on: 08/16/2022 11:34 AM   Modules accepted: Orders

## 2022-08-16 NOTE — Assessment & Plan Note (Addendum)
#  2021-Stage Ia right breast cancer status postlumpectomy;  ER/PR positive, HER2/neu negative. s/p adjuvant radiation therapy on 11/17/2019.  Currently on Aromasin [Fall of 2026]. APRIL 2024 -abnormal mammogram recent breast biopsy-negative for any malignancy.  Stable.   # Patient currently on Aromasin-tolerating well.  No worsening joint pains.  No worsening hot flashes.    # Hot flashes- G-2-second Aromasin.  Discussed regarding pharmacotherapy.  Patient declined stating "do not want to try 1 more pill"-monitor for now.  #  Osteopenia-bone density scan in April 2021 consistent with osteopenia.   Continue calcium 1200 mg and vitamin D 1000 IU daily along with weightbearing exercise as tolerated.  Plan to repeat bone density in April 2025.  will order at next visit.  Continue. prolia today.    #   Iron deficiency anemia-history of iron deficiency anemia.  On oral iron.  Not anemic on most recent blood work. 2023-Oct Iron sat 12%; ferritin-wnl.   Continue oral iron in the interim- stable.   # BG-1 pepsi [BG- 170]; slightly elevated creatinine 1.1 GFR 49.  Recently on prednisone for URI.  Also discussed regarding cutting down on sodas and also healthy weight loss.   # B12 deficiency.  History of B12 deficiency; NOV 2023- > 2000-  recommend on oral B12 3 times a week- stable.               # DISPOSITION: # Prolia today-  # follow up- in 6 months- MD; cbc/cmp; ca27-29; b12; prolia- Dr.B

## 2022-08-16 NOTE — Progress Notes (Signed)
Pt has had 2 URI's and the noro virus. She is feeling better now. Ribs still tender on right side from all the coughing. Still has lingering dyspnea. Denies any breast pain Appetite is good. Hot flashes from exemestane. Had mammogram in April.

## 2022-08-17 LAB — CANCER ANTIGEN 27.29: CA 27.29: 15.6 U/mL (ref 0.0–38.6)

## 2022-08-17 LAB — VITAMIN B12: Vitamin B-12: 1986 pg/mL — ABNORMAL HIGH (ref 180–914)

## 2022-08-25 NOTE — Patient Instructions (Signed)

## 2022-08-28 ENCOUNTER — Ambulatory Visit (INDEPENDENT_AMBULATORY_CARE_PROVIDER_SITE_OTHER): Payer: Medicare Other | Admitting: Nurse Practitioner

## 2022-08-28 VITALS — BP 126/77 | HR 65 | Temp 97.9°F | Ht 67.01 in | Wt 228.8 lb

## 2022-08-28 DIAGNOSIS — I10 Essential (primary) hypertension: Secondary | ICD-10-CM | POA: Diagnosis not present

## 2022-08-28 NOTE — Progress Notes (Signed)
BP 126/77   Pulse 65   Temp 97.9 F (36.6 C) (Oral)   Ht 5' 7.01" (1.702 m)   Wt 228 lb 12.8 oz (103.8 kg)   SpO2 98%   BMI 35.83 kg/m    Subjective:    Patient ID: Melissa Merritt, female    DOB: 11-03-1950, 72 y.o.   MRN: [id number]  HPI: Melissa Merritt is a 72 y.o. female  Chief Complaint  Patient presents with   Hypertension    Here for follow up for HTN, added back amlodipine at last visit   HYPERTENSION  At visit on 07/31/22 PCP added back on Amlodipine at low dosing -- has had no swelling.  We had tried increasing Amlodipine in past, but this caused edema.  She went to cardiology on 06/06/22, they stopped Amlodipine and started Spironolactone due to swelling.  Performed echo on 06/15/22 with LVEF 50% and normal LV systolic function and right systolic function with mild LVH - similar to echo on 08/24/20.  Takes Aromasin for history of breast cancer, has been on for 18 months.   Currently taking Spironolactone, Valsartan, Amlodipine, and Metoprolol for blood pressure.     In past took ACE inhibitor which made her feel strange and HCTZ caused muscle aches. Hypertension status: controlled  Satisfied with current treatment? yes Duration of hypertension: chronic BP monitoring frequency:  daily BP range: 120-130/70-80 range at home BP medication side effects:  no Medication compliance: good compliance Aspirin: no Recurrent headaches: no Visual changes: no Palpitations: no Dyspnea: no Chest pain: no Lower extremity edema: no Dizzy/lightheaded: no   Relevant past medical, surgical, family and social history reviewed and updated as indicated. Interim medical history since our last visit reviewed. Allergies and medications reviewed and updated.  Review of Systems  Constitutional:  Negative for activity change, appetite change, diaphoresis, fatigue and fever.  Respiratory:  Negative for cough, chest tightness, shortness of breath and wheezing.   Cardiovascular:  Negative  for chest pain, palpitations and leg swelling.  Gastrointestinal: Negative.   Endocrine: Negative for cold intolerance, heat intolerance, polydipsia, polyphagia and polyuria.  Neurological: Negative.   Psychiatric/Behavioral: Negative.      Per HPI unless specifically indicated above     Objective:    BP 126/77   Pulse 65   Temp 97.9 F (36.6 C) (Oral)   Ht 5' 7.01" (1.702 m)   Wt 228 lb 12.8 oz (103.8 kg)   SpO2 98%   BMI 35.83 kg/m   Wt Readings from Last 3 Encounters:  08/28/22 228 lb 12.8 oz (103.8 kg)  08/16/22 226 lb (102.5 kg)  07/31/22 230 lb 4.8 oz (104.5 kg)    Physical Exam Vitals and nursing note reviewed.  Constitutional:      General: She is awake. She is not in acute distress.    Appearance: She is well-developed and well-groomed. She is obese. She is not ill-appearing or toxic-appearing.  HENT:     Head: Normocephalic.     Right Ear: Hearing and external ear normal.     Left Ear: Hearing and external ear normal.  Eyes:     General: Lids are normal.        Right eye: No discharge.        Left eye: No discharge.     Conjunctiva/sclera: Conjunctivae normal.     Pupils: Pupils are equal, round, and reactive to light.  Neck:     Thyroid: No thyromegaly.     Vascular: No carotid  bruit.  Cardiovascular:     Rate and Rhythm: Normal rate and regular rhythm.     Heart sounds: Normal heart sounds. No murmur heard.    No gallop.  Pulmonary:     Effort: Pulmonary effort is normal. No accessory muscle usage or respiratory distress.     Breath sounds: Normal breath sounds.  Abdominal:     General: Bowel sounds are normal. There is no distension.     Palpations: Abdomen is soft.     Tenderness: There is no abdominal tenderness.  Musculoskeletal:     Cervical back: Normal range of motion and neck supple.     Right lower leg: No edema.     Left lower leg: No edema.  Lymphadenopathy:     Cervical: No cervical adenopathy.  Skin:    General: Skin is warm and  dry.  Neurological:     Mental Status: She is alert and oriented to person, place, and time.  Psychiatric:        Attention and Perception: Attention normal.        Mood and Affect: Mood normal.        Speech: Speech normal.        Behavior: Behavior normal. Behavior is cooperative.        Thought Content: Thought content normal.    Results for orders placed or performed in visit on 08/16/22  CMP (Cancer Center only)  Result Value Ref Range   Sodium 137 135 - 145 mmol/L   Potassium 4.3 3.5 - 5.1 mmol/L   Chloride 99 98 - 111 mmol/L   CO2 26 22 - 32 mmol/L   Glucose, Bld 170 (H) 70 - 99 mg/dL   BUN 19 8 - 23 mg/dL   Creatinine 1.61 (H) 0.96 - 1.00 mg/dL   Calcium 9.9 8.9 - 04.5 mg/dL   Total Protein 7.5 6.5 - 8.1 g/dL   Albumin 4.1 3.5 - 5.0 g/dL   AST 24 15 - 41 U/L   ALT 18 0 - 44 U/L   Alkaline Phosphatase 69 38 - 126 U/L   Total Bilirubin 0.7 0.3 - 1.2 mg/dL   GFR, Estimated 48 (L) >60 mL/min   Anion gap 12 5 - 15  CBC with Differential (Cancer Center Only)  Result Value Ref Range   WBC Count 6.0 4.0 - 10.5 K/uL   RBC 4.52 3.87 - 5.11 MIL/uL   Hemoglobin 13.6 12.0 - 15.0 g/dL   HCT 40.9 81.1 - 91.4 %   MCV 92.3 80.0 - 100.0 fL   MCH 30.1 26.0 - 34.0 pg   MCHC 32.6 30.0 - 36.0 g/dL   RDW 78.2 95.6 - 21.3 %   Platelet Count 241 150 - 400 K/uL   nRBC 0.0 0.0 - 0.2 %   Neutrophils Relative % 60 %   Neutro Abs 3.7 1.7 - 7.7 K/uL   Lymphocytes Relative 20 %   Lymphs Abs 1.2 0.7 - 4.0 K/uL   Monocytes Relative 10 %   Monocytes Absolute 0.6 0.1 - 1.0 K/uL   Eosinophils Relative 6 %   Eosinophils Absolute 0.4 0.0 - 0.5 K/uL   Basophils Relative 1 %   Basophils Absolute 0.1 0.0 - 0.1 K/uL   Immature Granulocytes 3 %   Abs Immature Granulocytes 0.15 (H) 0.00 - 0.07 K/uL  Cancer antigen 27.29  Result Value Ref Range   CA 27.29 15.6 0.0 - 38.6 U/mL  Vitamin B12  Result Value Ref Range   Vitamin B-12  1,986 (H) 180 - 914 pg/mL      Assessment & Plan:   Problem List  Items Addressed This Visit       Cardiovascular and Mediastinum   Essential hypertension - Primary    Chronic, stable.  Much improved levels in office and at home.  Continue Valsartan max dosing 320 MG and continue current Spironolactone and Metoprolol + Amlodipine at 5 MG, no swelling with this and is tolerating.  Monitor K+ level closely, BMP up to date.  Continue to monitor BP at home regularly and document + focus on DASH diet.  LABS: up to date.  Return in October for follow-up.  Had trend up in BP with Aromasin on board.        Follow up plan: Return in about 5 months (around 02/04/2023) for HTN/HLD, BREAST CA, OSTEOPENIA, ASTHMA.

## 2022-08-28 NOTE — Assessment & Plan Note (Signed)
Chronic, stable.  Much improved levels in office and at home.  Continue Valsartan max dosing 320 MG and continue current Spironolactone and Metoprolol + Amlodipine at 5 MG, no swelling with this and is tolerating.  Monitor K+ level closely, BMP up to date.  Continue to monitor BP at home regularly and document + focus on DASH diet.  LABS: up to date.  Return in October for follow-up.  Had trend up in BP with Aromasin on board.

## 2022-10-01 DIAGNOSIS — J449 Chronic obstructive pulmonary disease, unspecified: Secondary | ICD-10-CM | POA: Diagnosis not present

## 2022-10-16 DIAGNOSIS — J4541 Moderate persistent asthma with (acute) exacerbation: Secondary | ICD-10-CM | POA: Diagnosis not present

## 2022-10-30 DIAGNOSIS — R0609 Other forms of dyspnea: Secondary | ICD-10-CM | POA: Diagnosis not present

## 2023-01-10 DIAGNOSIS — G4733 Obstructive sleep apnea (adult) (pediatric): Secondary | ICD-10-CM | POA: Diagnosis not present

## 2023-01-10 DIAGNOSIS — R0602 Shortness of breath: Secondary | ICD-10-CM | POA: Diagnosis not present

## 2023-01-10 DIAGNOSIS — I10 Essential (primary) hypertension: Secondary | ICD-10-CM | POA: Diagnosis not present

## 2023-01-10 DIAGNOSIS — Z23 Encounter for immunization: Secondary | ICD-10-CM | POA: Diagnosis not present

## 2023-01-10 DIAGNOSIS — R600 Localized edema: Secondary | ICD-10-CM | POA: Diagnosis not present

## 2023-01-10 DIAGNOSIS — Z902 Acquired absence of lung [part of]: Secondary | ICD-10-CM | POA: Diagnosis not present

## 2023-01-10 DIAGNOSIS — I519 Heart disease, unspecified: Secondary | ICD-10-CM | POA: Diagnosis not present

## 2023-01-16 DIAGNOSIS — Z961 Presence of intraocular lens: Secondary | ICD-10-CM | POA: Diagnosis not present

## 2023-01-16 DIAGNOSIS — H5213 Myopia, bilateral: Secondary | ICD-10-CM | POA: Diagnosis not present

## 2023-01-16 DIAGNOSIS — H43813 Vitreous degeneration, bilateral: Secondary | ICD-10-CM | POA: Diagnosis not present

## 2023-01-16 DIAGNOSIS — H02839 Dermatochalasis of unspecified eye, unspecified eyelid: Secondary | ICD-10-CM | POA: Diagnosis not present

## 2023-01-16 DIAGNOSIS — H47323 Drusen of optic disc, bilateral: Secondary | ICD-10-CM | POA: Diagnosis not present

## 2023-01-16 DIAGNOSIS — H524 Presbyopia: Secondary | ICD-10-CM | POA: Diagnosis not present

## 2023-01-16 DIAGNOSIS — H52223 Regular astigmatism, bilateral: Secondary | ICD-10-CM | POA: Diagnosis not present

## 2023-01-26 DIAGNOSIS — Z79811 Long term (current) use of aromatase inhibitors: Secondary | ICD-10-CM | POA: Insufficient documentation

## 2023-01-26 NOTE — Patient Instructions (Signed)
Be Involved in Caring For Your Health:  Taking Medications When medications are taken as directed, they can greatly improve your health. But if they are not taken as prescribed, they may not work. In some cases, not taking them correctly can be harmful. To help ensure your treatment remains effective and safe, understand your medications and how to take them. Bring your medications to each visit for review by your provider.  Your lab results, notes, and after visit summary will be available on [name]. We strongly encourage you to use this feature. If lab results are abnormal the clinic will contact you with the appropriate steps. If the clinic does not contact you assume the results are satisfactory. You can always view your results on [name]. If you have questions regarding your health or results, please contact the clinic during office hours. You can also ask questions on [name].  We at [facility] are grateful that you chose us to provide your care. We strive to provide evidence-based and compassionate care and are always looking for feedback. If you get a survey from the clinic please complete this so we can hear your opinions.  DASH Eating Plan DASH stands for Dietary Approaches to Stop Hypertension. The DASH eating plan is a healthy eating plan that has been shown to: Lower high blood pressure (hypertension). Reduce your risk for type 2 diabetes, heart disease, and stroke. Help with weight loss. What are tips for following this plan? Reading food labels Check food labels for the amount of salt (sodium) per serving. Choose foods with less than 5 percent of the Daily Value (DV) of sodium. In general, foods with less than 300 milligrams (mg) of sodium per serving fit into this eating plan. To find whole grains, look for the word "whole" as the first word in the ingredient list. Shopping Buy products labeled as "low-sodium" or "no salt added." Buy fresh foods. Avoid canned  foods and pre-made or frozen meals. Cooking Try not to add salt when you cook. Use salt-free seasonings or herbs instead of table salt or sea salt. Check with your health care provider or pharmacist before using salt substitutes. Do not fry foods. Cook foods in healthy ways, such as baking, boiling, grilling, roasting, or broiling. Cook using oils that are good for your heart. These include olive, canola, avocado, soybean, and sunflower oil. Meal planning  Eat a balanced diet. This should include: 4 or more servings of fruits and 4 or more servings of vegetables each day. Try to fill half of your plate with fruits and vegetables. 6-8 servings of whole grains each day. 6 or less servings of lean meat, poultry, or fish each day. 1 oz is 1 serving. A 3 oz (85 g) serving of meat is about the same size as the palm of your hand. One egg is 1 oz (28 g). 2-3 servings of low-fat dairy each day. One serving is 1 cup (237 mL). 1 serving of nuts, seeds, or beans 5 times each week. 2-3 servings of heart-healthy fats. Healthy fats called omega-3 fatty acids are found in foods such as walnuts, flaxseeds, fortified milks, and eggs. These fats are also found in cold-water fish, such as sardines, salmon, and mackerel. Limit how much you eat of: Canned or prepackaged foods. Food that is high in trans fat, such as fried foods. Food that is high in saturated fat, such as fatty meat. Desserts and other sweets, sugary drinks, and other foods with added sugar. Full-fat   dairy products. Do not salt foods before eating. Do not eat more than 4 egg yolks a week. Try to eat at least 2 vegetarian meals a week. Eat more home-cooked food and less restaurant, buffet, and fast food. Lifestyle When eating at a restaurant, ask if your food can be made with less salt or no salt. If you drink alcohol: Limit how much you have to: 0-1 drink a day if you are female. 0-2 drinks a day if you are female. Know how much alcohol is in  your drink. In the [place] one drink is one 12 oz bottle of beer (355 mL), one 5 oz glass of wine (148 mL), or one 1 oz glass of hard liquor (44 mL). General information Avoid eating more than 2,300 mg of salt a day. If you have hypertension, you may need to reduce your sodium intake to 1,500 mg a day. Work with your provider to stay at a healthy body weight or lose weight. Ask what the best weight range is for you. On most days of the week, get at least 30 minutes of exercise that causes your heart to beat faster. This may include walking, swimming, or biking. Work with your provider or dietitian to adjust your eating plan to meet your specific calorie needs. What foods should I eat? Fruits All fresh, dried, or frozen fruit. Canned fruits that are in their natural juice and do not have sugar added to them. Vegetables Fresh or frozen vegetables that are raw, steamed, roasted, or grilled. Low-sodium or reduced-sodium tomato and vegetable juice. Low-sodium or reduced-sodium tomato sauce and tomato paste. Low-sodium or reduced-sodium canned vegetables. Grains Whole-grain or whole-wheat bread. Whole-grain or whole-wheat pasta. Brown rice. Oatmeal. Quinoa. Bulgur. Whole-grain and low-sodium cereals. Pita bread. Low-fat, low-sodium crackers. Whole-wheat flour tortillas. Meats and other proteins Skinless chicken or turkey. Ground chicken or turkey. Pork with fat trimmed off. Fish and seafood. Egg whites. Dried beans, peas, or lentils. Unsalted nuts, nut butters, and seeds. Unsalted canned beans. Lean cuts of beef with fat trimmed off. Low-sodium, lean precooked or cured meat, such as sausages or meat loaves. Dairy Low-fat (1%) or fat-free (skim) milk. Reduced-fat, low-fat, or fat-free cheeses. Nonfat, low-sodium ricotta or cottage cheese. Low-fat or nonfat yogurt. Low-fat, low-sodium cheese. Fats and oils Soft margarine without trans fats. Vegetable oil. Reduced-fat, low-fat, or light mayonnaise and salad  dressings (reduced-sodium). Canola, safflower, olive, avocado, soybean, and sunflower oils. Avocado. Seasonings and condiments Herbs. Spices. Seasoning mixes without salt. Other foods Unsalted popcorn and pretzels. Fat-free sweets. The items listed above may not be all the foods and drinks you can have. Talk to a dietitian to learn more. What foods should I avoid? Fruits Canned fruit in a light or heavy syrup. Fried fruit. Fruit in cream or butter sauce. Vegetables Creamed or fried vegetables. Vegetables in a cheese sauce. Regular canned vegetables that are not marked as low-sodium or reduced-sodium. Regular canned tomato sauce and paste that are not marked as low-sodium or reduced-sodium. Regular tomato and vegetable juices that are not marked as low-sodium or reduced-sodium. Pickles. Olives. Grains Baked goods made with fat, such as croissants, muffins, or some breads. Dry pasta or rice meal packs. Meats and other proteins Fatty cuts of meat. Ribs. Fried meat. Bacon. Bologna, salami, and other precooked or cured meats, such as sausages or meat loaves, that are not lean and low in sodium. Fat from the back of a pig (fatback). Bratwurst. Salted nuts and seeds. Canned beans with added salt. Canned   or smoked fish. Whole eggs or egg yolks. Chicken or turkey with skin. Dairy Whole or 2% milk, cream, and half-and-half. Whole or full-fat cream cheese. Whole-fat or sweetened yogurt. Full-fat cheese. Nondairy creamers. Whipped toppings. Processed cheese and cheese spreads. Fats and oils Butter. Stick margarine. Lard. Shortening. Ghee. Bacon fat. Tropical oils, such as coconut, palm kernel, or palm oil. Seasonings and condiments Onion salt, garlic salt, seasoned salt, table salt, and sea salt. Worcestershire sauce. Tartar sauce. Barbecue sauce. Teriyaki sauce. Soy sauce, including reduced-sodium soy sauce. Steak sauce. Canned and packaged gravies. Fish sauce. Oyster sauce. Cocktail sauce. Store-bought  horseradish. Ketchup. Mustard. Meat flavorings and tenderizers. Bouillon cubes. Hot sauces. Pre-made or packaged marinades. Pre-made or packaged taco seasonings. Relishes. Regular salad dressings. Other foods Salted popcorn and pretzels. The items listed above may not be all the foods and drinks you should avoid. Talk to a dietitian to learn more. Where to find more information National Heart, Lung, and Blood Institute (NHLBI): nhlbi.nih.gov American Heart Association (AHA): heart.org Academy of Nutrition and Dietetics: eatright.org National Kidney Foundation (NKF): kidney.org This information is not intended to replace advice given to you by your health care provider. Make sure you discuss any questions you have with your health care provider. Document Revised: 04/12/2022 Document Reviewed: 04/12/2022 Elsevier Patient Education  2024 Elsevier Inc.  

## 2023-01-28 ENCOUNTER — Ambulatory Visit (INDEPENDENT_AMBULATORY_CARE_PROVIDER_SITE_OTHER): Payer: Medicare Other | Admitting: Nurse Practitioner

## 2023-01-28 ENCOUNTER — Encounter: Payer: Self-pay | Admitting: Nurse Practitioner

## 2023-01-28 VITALS — BP 118/74 | HR 67 | Temp 97.9°F | Ht 67.0 in | Wt 233.8 lb

## 2023-01-28 DIAGNOSIS — J4489 Other specified chronic obstructive pulmonary disease: Secondary | ICD-10-CM | POA: Diagnosis not present

## 2023-01-28 DIAGNOSIS — E782 Mixed hyperlipidemia: Secondary | ICD-10-CM | POA: Diagnosis not present

## 2023-01-28 DIAGNOSIS — Z23 Encounter for immunization: Secondary | ICD-10-CM

## 2023-01-28 DIAGNOSIS — C50811 Malignant neoplasm of overlapping sites of right female breast: Secondary | ICD-10-CM

## 2023-01-28 DIAGNOSIS — E66812 Obesity, class 2: Secondary | ICD-10-CM | POA: Diagnosis not present

## 2023-01-28 DIAGNOSIS — I10 Essential (primary) hypertension: Secondary | ICD-10-CM | POA: Diagnosis not present

## 2023-01-28 DIAGNOSIS — G4733 Obstructive sleep apnea (adult) (pediatric): Secondary | ICD-10-CM

## 2023-01-28 DIAGNOSIS — Z170 Estrogen receptor positive status [ER+]: Secondary | ICD-10-CM | POA: Diagnosis not present

## 2023-01-28 DIAGNOSIS — Z79811 Long term (current) use of aromatase inhibitors: Secondary | ICD-10-CM | POA: Diagnosis not present

## 2023-01-28 DIAGNOSIS — M8588 Other specified disorders of bone density and structure, other site: Secondary | ICD-10-CM

## 2023-01-28 DIAGNOSIS — R7301 Impaired fasting glucose: Secondary | ICD-10-CM

## 2023-01-28 NOTE — Assessment & Plan Note (Signed)
Chronic, stable.  BP stable on recheck at office today and on average at goal at home.  Continue Valsartan max dosing 320 MG and continue current Spironolactone and Metoprolol + Amlodipine at 5 MG, no swelling with this present.  Monitor K+ level closely.  Continue to monitor BP at home regularly and document + focus on DASH diet.  LABS: CMP. Had trend up in BP with Aromasin on board.

## 2023-01-28 NOTE — Assessment & Plan Note (Signed)
Chronic, stable with Trelegy.  Continue collaboration with pulmonary and current medications as prescribed by them.  Recent note reviewed.

## 2023-01-28 NOTE — Assessment & Plan Note (Signed)
A1c today for recheck == A1c last check 5.9%.  Continue diet focus at home.

## 2023-01-28 NOTE — Assessment & Plan Note (Signed)
BMI 36.62.  Recommended eating smaller high protein, low fat meals more frequently and exercising 30 mins a day 5 times a week with a goal of 10-15lb weight loss in the next 3 months. Patient voiced their understanding and motivation to adhere to these recommendations. ° °

## 2023-01-28 NOTE — Assessment & Plan Note (Addendum)
Chronic, ongoing.  Again reviewed ASCVD score with patient and discussed risks, she refuses statin, does not wish to take.  Continue red yeast rice and obtain labs today.   The 10-year ASCVD risk score (Arnett DK, et al., 2019) is: 16%   Values used to calculate the score:     Age: 72 years     Sex: Female     Is Non-Hispanic African American: No     Diabetic: No     Tobacco smoker: No     Systolic Blood Pressure: 118 mmHg     Is BP treated: Yes     HDL Cholesterol: 43 mg/dL     Total Cholesterol: 159 mg/dL

## 2023-01-28 NOTE — Assessment & Plan Note (Signed)
Chronic.  Noted on DEXA 08/04/19 -- continue Vitamin D daily + Prolia and recommend repeat DEXA in 5 years.  Continue to collaborate with oncology, obtains Prolia with them.

## 2023-01-28 NOTE — Progress Notes (Signed)
BP 118/74 (BP Location: Left Arm, Patient Position: Sitting, Cuff Size: Large)   Pulse 67   Temp 97.9 F (36.6 C) (Oral)   Ht 5\' 7"  (1.702 m)   Wt 233 lb 12.8 oz (106.1 kg)   SpO2 93%   BMI 36.62 kg/m    Subjective:    Patient ID: Melissa Merritt, female    DOB: [birth year], 72 y.o.   MRN: [id number]  HPI: Melissa Merritt is a 72 y.o. female  Chief Complaint  Patient presents with   Asthma   Hyperlipidemia   Hypertension   Osteopenia   HYPERTENSION / HYPERLIPIDEMIA Taking Valsartan, Metoprolol, Amlodipine, Spironolactone.  Cardiology visit last 01/10/23.  Echo last 06/15/22 with LV function LVEF 45-50% with normal LVH which is consistent with previous echos, going back to 2013.   Continues to refuse statin therapy, even after at length discussion about risk reduction with PCP and cardiology.  Is taking red yeast rice.   Satisfied with current treatment? yes Duration of hypertension: chronic BP monitoring frequency: a few times a week BP range: 120-140/70-80 BP medication side effects: no Duration of hyperlipidemia: chronic Cholesterol medication side effects: no Cholesterol supplements: red yeast rice Medication compliance: good compliance Aspirin: yes Recent stressors: no Recurrent headaches: no Visual changes: no Palpitations: no Dyspnea: at baseline -- no changes, follows with pulmonary Chest pain: no Lower extremity edema: occasional Dizzy/lightheaded: no The 10-year ASCVD risk score (Arnett DK, et al., 2019) is: 16%   Values used to calculate the score:     Age: 80 years     Sex: Female     Is Non-Hispanic African American: No     Diabetic: No     Tobacco smoker: No     Systolic Blood Pressure: 118 mmHg     Is BP treated: Yes     HDL Cholesterol: 43 mg/dL     Total Cholesterol: 159 mg/dL  Impaired Fasting Glucose HbA1C:  Lab Results  Component Value Date   HGBA1C 5.9 (H) 05/24/2022  Duration of elevated blood sugar:  Polydipsia: no Polyuria:  no Weight change: no Visual disturbance: no Glucose Monitoring: no    Accucheck frequency: Not Checking    Fasting glucose:     Post prandial:  Diabetic Education: Not Completed Family history of diabetes: no    BREAST CANCER: Follows with oncology and received radiation treatment. Finished treatments 11/16/2019, continues to be followed by oncology every 6 months, last visit 08/16/22. Taking Aromasin, currently doing well with this -- has 2 more years to go on this.  Osteopenia on DEXA followed with oncology -- taking Prolia with them.   ASTHMA & OSA Follows with pulmonary and last seen 10/16/22 for asthma. Continues Trelegy - they tried Clinical cytogeneticist but did not tolerate as well.  Stage 2 COPD. Completed pulmonary rehab in December 2022.  Not using CPAP at present, has a new mask ordered by pulmonary -- has not received yet.  Last exacerbation treated 10/16/22 -- took Zpack and steroid which improved. Asthma status: stable Satisfied with current treatment?: yes Albuterol/rescue inhaler frequency: none Dyspnea frequency: at baseline - no changes Wheezing frequency: no Cough frequency: no Nocturnal symptom frequency: no Limitation of activity: no Current upper respiratory symptoms: no Triggers: unknown Home peak flows:no Last Spirometry: with pulmonary Failed/intolerant to following asthma meds: Breztri Asthma meds in past: as on chart Aerochamber/spacer use: no Visits to ER or Urgent Care in past year: no Pneumovax: Up to Date Influenza: Up to Date  01/28/2023    9:24 AM 08/28/2022   11:04 AM 07/03/2022    9:31 AM 06/12/2022   10:57 AM 05/24/2022    9:12 AM  Depression screen PHQ 2/9  Decreased Interest 0 0 0 0 0  Down, Depressed, Hopeless 1 0 0 0 0  PHQ - 2 Score 1 0 0 0 0  Altered sleeping 2 2 2  0 2  Tired, decreased energy 1 0 1 0 1  Change in appetite 0 0 0 0 0  Feeling bad or failure about yourself  0 0 0 0 0  Trouble concentrating 0 0 0 0 0  Moving slowly or  fidgety/restless 0 0 0 0 0  Suicidal thoughts 0 0 0 0 0  PHQ-9 Score 4 2 3  0 3  Difficult doing work/chores Not difficult at all Not difficult at all Not difficult at all Not difficult at all        01/28/2023    9:24 AM 08/28/2022   11:04 AM 07/03/2022    9:31 AM 05/24/2022    9:12 AM  GAD 7 : Generalized Anxiety Score  Nervous, Anxious, on Edge 1 0 0 0  Control/stop worrying 1 1 0 0  Worry too much - different things 1 0 1 1  Trouble relaxing 1 0 0 0  Restless 1 0 0 0  Easily annoyed or irritable 0 0 0 0  Afraid - awful might happen 1 0 0 0  Total GAD 7 Score 6 1 1 1   Anxiety Difficulty Not difficult at all Not difficult at all Not difficult at all Not difficult at all   Relevant past medical, surgical, family and social history reviewed and updated as indicated. Interim medical history since our last visit reviewed. Allergies and medications reviewed and updated.  Review of Systems  Constitutional:  Negative for activity change, appetite change, diaphoresis, fatigue and fever.  Respiratory:  Positive for shortness of breath (at baseline). Negative for cough, chest tightness and wheezing.   Cardiovascular:  Negative for chest pain, palpitations and leg swelling.  Gastrointestinal: Negative.   Endocrine: Negative for cold intolerance, heat intolerance, polydipsia, polyphagia and polyuria.  Neurological: Negative.   Psychiatric/Behavioral: Negative.     Per HPI unless specifically indicated above     Objective:    BP 118/74 (BP Location: Left Arm, Patient Position: Sitting, Cuff Size: Large)   Pulse 67   Temp 97.9 F (36.6 C) (Oral)   Ht 5\' 7"  (1.702 m)   Wt 233 lb 12.8 oz (106.1 kg)   SpO2 93%   BMI 36.62 kg/m   Wt Readings from Last 3 Encounters:  01/28/23 233 lb 12.8 oz (106.1 kg)  08/28/22 228 lb 12.8 oz (103.8 kg)  08/16/22 226 lb (102.5 kg)    Physical Exam Vitals and nursing note reviewed.  Constitutional:      General: She is awake. She is not in acute  distress.    Appearance: She is well-developed and well-groomed. She is obese. She is not ill-appearing or toxic-appearing.  HENT:     Head: Normocephalic.     Right Ear: Hearing and external ear normal.     Left Ear: Hearing and external ear normal.  Eyes:     General: Lids are normal.        Right eye: No discharge.        Left eye: No discharge.     Conjunctiva/sclera: Conjunctivae normal.     Pupils: Pupils are equal, round, and reactive to  light.  Neck:     Thyroid: No thyromegaly.     Vascular: No carotid bruit.  Cardiovascular:     Rate and Rhythm: Normal rate and regular rhythm.     Heart sounds: Normal heart sounds. No murmur heard.    No gallop.  Pulmonary:     Effort: Pulmonary effort is normal. No accessory muscle usage or respiratory distress.     Breath sounds: Normal breath sounds.  Abdominal:     General: Bowel sounds are normal. There is no distension.     Palpations: Abdomen is soft.     Tenderness: There is no abdominal tenderness.  Musculoskeletal:     Cervical back: Normal range of motion and neck supple.     Right lower leg: No edema.     Left lower leg: No edema.  Lymphadenopathy:     Cervical: No cervical adenopathy.  Skin:    General: Skin is warm and dry.  Neurological:     Mental Status: She is alert and oriented to person, place, and time.  Psychiatric:        Attention and Perception: Attention normal.        Mood and Affect: Mood normal.        Speech: Speech normal.        Behavior: Behavior normal. Behavior is cooperative.        Thought Content: Thought content normal.     Results for orders placed or performed in visit on 08/16/22  CMP (Cancer Center only)  Result Value Ref Range   Sodium 137 135 - 145 mmol/L   Potassium 4.3 3.5 - 5.1 mmol/L   Chloride 99 98 - 111 mmol/L   CO2 26 22 - 32 mmol/L   Glucose, Bld 170 (H) 70 - 99 mg/dL   BUN 19 8 - 23 mg/dL   Creatinine 4.69 (H) 6.29 - 1.00 mg/dL   Calcium 9.9 8.9 - 52.8 mg/dL    Total Protein 7.5 6.5 - 8.1 g/dL   Albumin 4.1 3.5 - 5.0 g/dL   AST 24 15 - 41 U/L   ALT 18 0 - 44 U/L   Alkaline Phosphatase 69 38 - 126 U/L   Total Bilirubin 0.7 0.3 - 1.2 mg/dL   GFR, Estimated 48 (L) >60 mL/min   Anion gap 12 5 - 15  CBC with Differential (Cancer Center Only)  Result Value Ref Range   WBC Count 6.0 4.0 - 10.5 K/uL   RBC 4.52 3.87 - 5.11 MIL/uL   Hemoglobin 13.6 12.0 - 15.0 g/dL   HCT 41.3 24.4 - 01.0 %   MCV 92.3 80.0 - 100.0 fL   MCH 30.1 26.0 - 34.0 pg   MCHC 32.6 30.0 - 36.0 g/dL   RDW 27.2 53.6 - 64.4 %   Platelet Count 241 150 - 400 K/uL   nRBC 0.0 0.0 - 0.2 %   Neutrophils Relative % 60 %   Neutro Abs 3.7 1.7 - 7.7 K/uL   Lymphocytes Relative 20 %   Lymphs Abs 1.2 0.7 - 4.0 K/uL   Monocytes Relative 10 %   Monocytes Absolute 0.6 0.1 - 1.0 K/uL   Eosinophils Relative 6 %   Eosinophils Absolute 0.4 0.0 - 0.5 K/uL   Basophils Relative 1 %   Basophils Absolute 0.1 0.0 - 0.1 K/uL   Immature Granulocytes 3 %   Abs Immature Granulocytes 0.15 (H) 0.00 - 0.07 K/uL  Cancer antigen 27.29  Result Value Ref Range   CA  27.29 15.6 0.0 - 38.6 U/mL  Vitamin B12  Result Value Ref Range   Vitamin B-12 1,986 (H) 180 - 914 pg/mL      Assessment & Plan:   Problem List Items Addressed This Visit       Cardiovascular and Mediastinum   Essential hypertension    Chronic, stable.  BP stable on recheck at office today and on average at goal at home.  Continue Valsartan max dosing 320 MG and continue current Spironolactone and Metoprolol + Amlodipine at 5 MG, no swelling with this present.  Monitor K+ level closely.  Continue to monitor BP at home regularly and document + focus on DASH diet.  LABS: CMP. Had trend up in BP with Aromasin on board.        Respiratory   Asthma-COPD overlap syndrome (HCC)    Chronic, stable with Trelegy.  Continue collaboration with pulmonary and current medications as prescribed by them.  Recent note reviewed.      Relevant  Medications   azelastine (ASTELIN) 0.1 % nasal spray   Obstructive apnea    Chronic, ongoing.  Recommend she use her CPAP 100% of the time, currently is not using and waiting for new mask.        Endocrine   IFG (impaired fasting glucose)    A1c today for recheck == A1c last check 5.9%.  Continue diet focus at home.      Relevant Orders   HgB A1c     Musculoskeletal and Integument   Osteopenia of lumbar spine    Chronic.  Noted on DEXA 08/04/19 -- continue Vitamin D daily + Prolia and recommend repeat DEXA in 5 years.  Continue to collaborate with oncology, obtains Prolia with them.        Other   Carcinoma of overlapping sites of right breast in female, estrogen receptor positive (HCC) - Primary    Ongoing, continue collaboration with oncology.  Recent notes reviewed.      Hyperlipidemia    Chronic, ongoing.  Again reviewed ASCVD score with patient and discussed risks, she refuses statin, does not wish to take.  Continue red yeast rice and obtain labs today.   The 10-year ASCVD risk score (Arnett DK, et al., 2019) is: 16%   Values used to calculate the score:     Age: 62 years     Sex: Female     Is Non-Hispanic African American: No     Diabetic: No     Tobacco smoker: No     Systolic Blood Pressure: 118 mmHg     Is BP treated: Yes     HDL Cholesterol: 43 mg/dL     Total Cholesterol: 159 mg/dL       Relevant Orders   Comprehensive metabolic panel   Lipid Panel w/o Chol/HDL Ratio   Long term (current) use of aromatase inhibitors    Followed by oncology, has 2 years left to continue taking.      Obesity    BMI 36.62.  Recommended eating smaller high protein, low fat meals more frequently and exercising 30 mins a day 5 times a week with a goal of 10-15lb weight loss in the next 3 months. Patient voiced their understanding and motivation to adhere to these recommendations.       Other Visit Diagnoses     Flu vaccine need       Flu vaccine in office today,  educated patient.   Relevant Orders   Flu Vaccine Trivalent High  Dose (Fluad) (Completed)        Follow up plan: Return in about 6 months (around 07/29/2023) for HTN/HLD, IFG, BREAST CA, ASTHMA, OSTEOPENIA.

## 2023-01-28 NOTE — Assessment & Plan Note (Signed)
Chronic, ongoing.  Recommend she use her CPAP 100% of the time, currently is not using and waiting for new mask.

## 2023-01-28 NOTE — Assessment & Plan Note (Signed)
Followed by oncology, has 2 years left to continue taking.

## 2023-01-28 NOTE — Assessment & Plan Note (Signed)
Ongoing, continue collaboration with oncology.  Recent notes reviewed. 

## 2023-01-29 LAB — LIPID PANEL W/O CHOL/HDL RATIO
Cholesterol, Total: 203 mg/dL — ABNORMAL HIGH (ref 100–199)
HDL: 48 mg/dL (ref >39)
LDL Chol Calc (NIH): 133 mg/dL — ABNORMAL HIGH (ref 0–99)
Triglycerides: 125 mg/dL (ref 0–149)
VLDL Cholesterol Cal: 22 mg/dL (ref 5–40)

## 2023-01-29 LAB — COMPREHENSIVE METABOLIC PANEL
ALT: 17 [IU]/L (ref 0–32)
AST: 13 [IU]/L (ref 0–40)
Albumin: 4.2 g/dL (ref 3.8–4.8)
Alkaline Phosphatase: 86 [IU]/L (ref 44–121)
BUN/Creatinine Ratio: 17 (ref 12–28)
BUN: 19 mg/dL (ref 8–27)
Bilirubin Total: 0.3 mg/dL (ref 0.0–1.2)
CO2: 25 mmol/L (ref 20–29)
Calcium: 9.7 mg/dL (ref 8.7–10.3)
Chloride: 104 mmol/L (ref 96–106)
Creatinine, Ser: 1.1 mg/dL — ABNORMAL HIGH (ref 0.57–1.00)
Globulin, Total: 2.4 g/dL (ref 1.5–4.5)
Glucose: 129 mg/dL — ABNORMAL HIGH (ref 70–99)
Potassium: 4 mmol/L (ref 3.5–5.2)
Sodium: 143 mmol/L (ref 134–144)
Total Protein: 6.6 g/dL (ref 6.0–8.5)
eGFR: 53 mL/min/{1.73_m2} — ABNORMAL LOW (ref >59)

## 2023-01-29 LAB — HEMOGLOBIN A1C
Est. average glucose Bld gHb Est-mCnc: 131 mg/dL
Hgb A1c MFr Bld: 6.2 % — ABNORMAL HIGH (ref 4.8–5.6)

## 2023-01-29 NOTE — Progress Notes (Signed)
Contacted via MyChart   Good afternoon Melissa Merritt, your labs have returned: - A1c has trended up a little from 5.9% to 6.2%.  Remember any number >6.5% is considered diabetes.  Please focus heavily on healthy diet changes and regular exercise. 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 - Cholesterol and LDL remain elevated, I do continue to recommend statin therapy.  Let me know if you decide you would like to try a low dose of one for stroke prevention and heart protection.  Any questions? Keep being wonderful!!  Thank you for allowing me to participate in your care.  I appreciate you. Kindest regards, Tascha Casares

## 2023-02-18 ENCOUNTER — Inpatient Hospital Stay: Payer: Medicare Other

## 2023-02-18 ENCOUNTER — Inpatient Hospital Stay: Payer: Medicare Other | Attending: Internal Medicine | Admitting: Internal Medicine

## 2023-02-18 ENCOUNTER — Encounter: Payer: Self-pay | Admitting: Internal Medicine

## 2023-02-18 VITALS — BP 142/77 | HR 77 | Temp 97.0°F | Ht 67.0 in | Wt 233.4 lb

## 2023-02-18 DIAGNOSIS — I509 Heart failure, unspecified: Secondary | ICD-10-CM | POA: Insufficient documentation

## 2023-02-18 DIAGNOSIS — M8580 Other specified disorders of bone density and structure, unspecified site: Secondary | ICD-10-CM | POA: Diagnosis not present

## 2023-02-18 DIAGNOSIS — Z9889 Other specified postprocedural states: Secondary | ICD-10-CM | POA: Diagnosis not present

## 2023-02-18 DIAGNOSIS — Z170 Estrogen receptor positive status [ER+]: Secondary | ICD-10-CM

## 2023-02-18 DIAGNOSIS — R5383 Other fatigue: Secondary | ICD-10-CM | POA: Diagnosis not present

## 2023-02-18 DIAGNOSIS — Z79811 Long term (current) use of aromatase inhibitors: Secondary | ICD-10-CM | POA: Insufficient documentation

## 2023-02-18 DIAGNOSIS — C50811 Malignant neoplasm of overlapping sites of right female breast: Secondary | ICD-10-CM

## 2023-02-18 DIAGNOSIS — E538 Deficiency of other specified B group vitamins: Secondary | ICD-10-CM | POA: Insufficient documentation

## 2023-02-18 DIAGNOSIS — Z1231 Encounter for screening mammogram for malignant neoplasm of breast: Secondary | ICD-10-CM | POA: Diagnosis not present

## 2023-02-18 DIAGNOSIS — I110 Hypertensive heart disease with heart failure: Secondary | ICD-10-CM | POA: Diagnosis not present

## 2023-02-18 DIAGNOSIS — D509 Iron deficiency anemia, unspecified: Secondary | ICD-10-CM | POA: Insufficient documentation

## 2023-02-18 DIAGNOSIS — M8588 Other specified disorders of bone density and structure, other site: Secondary | ICD-10-CM

## 2023-02-18 DIAGNOSIS — Z803 Family history of malignant neoplasm of breast: Secondary | ICD-10-CM | POA: Insufficient documentation

## 2023-02-18 DIAGNOSIS — Z87891 Personal history of nicotine dependence: Secondary | ICD-10-CM | POA: Diagnosis not present

## 2023-02-18 DIAGNOSIS — Z923 Personal history of irradiation: Secondary | ICD-10-CM | POA: Insufficient documentation

## 2023-02-18 DIAGNOSIS — Z801 Family history of malignant neoplasm of trachea, bronchus and lung: Secondary | ICD-10-CM | POA: Diagnosis not present

## 2023-02-18 DIAGNOSIS — C50911 Malignant neoplasm of unspecified site of right female breast: Secondary | ICD-10-CM | POA: Insufficient documentation

## 2023-02-18 LAB — CBC WITH DIFFERENTIAL (CANCER CENTER ONLY)
Abs Immature Granulocytes: 0.14 10*3/uL — ABNORMAL HIGH (ref 0.00–0.07)
Basophils Absolute: 0.1 10*3/uL (ref 0.0–0.1)
Basophils Relative: 1 %
Eosinophils Absolute: 0.6 10*3/uL — ABNORMAL HIGH (ref 0.0–0.5)
Eosinophils Relative: 7 %
HCT: 39.7 % (ref 36.0–46.0)
Hemoglobin: 13 g/dL (ref 12.0–15.0)
Immature Granulocytes: 2 %
Lymphocytes Relative: 16 %
Lymphs Abs: 1.2 10*3/uL (ref 0.7–4.0)
MCH: 30.2 pg (ref 26.0–34.0)
MCHC: 32.7 g/dL (ref 30.0–36.0)
MCV: 92.1 fL (ref 80.0–100.0)
Monocytes Absolute: 0.8 10*3/uL (ref 0.1–1.0)
Monocytes Relative: 10 %
Neutro Abs: 5.1 10*3/uL (ref 1.7–7.7)
Neutrophils Relative %: 64 %
Platelet Count: 231 10*3/uL (ref 150–400)
RBC: 4.31 MIL/uL (ref 3.87–5.11)
RDW: 13.2 % (ref 11.5–15.5)
WBC Count: 7.9 10*3/uL (ref 4.0–10.5)
nRBC: 0 % (ref 0.0–0.2)

## 2023-02-18 LAB — CMP (CANCER CENTER ONLY)
ALT: 17 U/L (ref 0–44)
AST: 17 U/L (ref 15–41)
Albumin: 4.1 g/dL (ref 3.5–5.0)
Alkaline Phosphatase: 78 U/L (ref 38–126)
Anion gap: 9 (ref 5–15)
BUN: 15 mg/dL (ref 8–23)
CO2: 26 mmol/L (ref 22–32)
Calcium: 9.7 mg/dL (ref 8.9–10.3)
Chloride: 103 mmol/L (ref 98–111)
Creatinine: 0.97 mg/dL (ref 0.44–1.00)
GFR, Estimated: >60 mL/min (ref >60)
Glucose, Bld: 170 mg/dL — ABNORMAL HIGH (ref 70–99)
Potassium: 3.7 mmol/L (ref 3.5–5.1)
Sodium: 138 mmol/L (ref 135–145)
Total Bilirubin: 0.5 mg/dL (ref <1.2)
Total Protein: 7.2 g/dL (ref 6.5–8.1)

## 2023-02-18 LAB — VITAMIN B12: Vitamin B-12: 2485 pg/mL — ABNORMAL HIGH (ref 180–914)

## 2023-02-18 MED ORDER — DENOSUMAB 60 MG/ML ~~LOC~~ SOSY
60.0000 mg | PREFILLED_SYRINGE | Freq: Once | SUBCUTANEOUS | Status: AC
  Administered 2023-02-18: 60 mg via SUBCUTANEOUS

## 2023-02-18 NOTE — Progress Notes (Signed)
No questions / concerns.

## 2023-02-18 NOTE — Progress Notes (Signed)
[facility] CONSULT NOTE  Patient Care Team: Marjie Skiff, NP as PCP - General (Nurse Practitioner) Domingo Madeira, OD (Optometry) Marcina Millard, MD as Consulting Physician (Cardiology) Scot Jun, MD (Inactive) (Gastroenterology) Wyn Quaker Marlow Baars, MD as Referring Physician (Vascular Surgery) Leanora Ivanoff, Claudius Sis, MD (Dermatology) Scarlett Presto, RN (Inactive) as Oncology Nurse Navigator Sung Amabile, DO as Consulting Physician (Surgery) Carmina Miller, MD as Radiation Oncologist (Radiation Oncology) Earna Coder, MD as Consulting Physician (Hematology and Oncology)  CHIEF COMPLAINTS/PURPOSE OF CONSULTATION: right breast cancer   #  Oncology History Overview Note  Bilateral diagnostic mammogram on 07/08/2019 revealed persistent architectural distortion involving the upper outer quadrant of the right breast without sonographic correlate.  There was no pathologic right axillary lymphadenopathy.  No mammographic or sonographic evidence of malignancy in the left breast.  CA27.29 was 19.0 (07/24/19) at diagnosis.   Patient underwent stereotactic biopsy on 07/17/2019.  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  Intermediate nuclear grade with central necrosis.  Consistent with stage Ia right breast cancer.  She underwent partial mastectomy and right axillary sentinel lymph node biopsy on 08/13/2019.  Pathology revealed grade 1 invasive mammary carcinoma, no special type, and intermediate grade DCIS.  There is atypical lobular hyperplasia with ductal involvement.  Additional posterior margin revealed no evidence of residual carcinoma or DCIS.  Closest margin was > 10 mm and 2 mm for DCIS.  1 lymph node was negative for malignancy.  Tumor was ER/PR positive and HER-2/neu negative.  Pathologic stage was pT1a pN0 (sn).   She received 5040 cGy to her right breast and 1400 cGy boost to her scar from 09/28/2019-11/17/2019.   She began tamoxifen on 11/27/2019.  Tamoxifen discontinued in December 2021 due to intolerable side effects.  She was switched to Femara, started 04/12/20.  stopped in 9th May 2022 sec to leg swelling.  # AUG 2022- STARTED AROMASIN  Baseline bone density scan in April 2021 consistent with osteopenia with T score of -1.5. Plan for prolia.    Carcinoma of overlapping sites of right breast in female, estrogen receptor positive (HCC)  07/24/2019 Initial Diagnosis   Malignant neoplasm of right breast in female, estrogen receptor positive (HCC)   Carcinoma of overlapping sites of right breast in female, estrogen receptor positive (HCC) (Resolved)  02/14/2021 Initial Diagnosis   Carcinoma of overlapping sites of right breast in female, estrogen receptor positive (HCC)      HISTORY OF PRESENTING ILLNESS: Patient ambulating-independently.   Alone/.  Melissa Merritt 72 y.o.  female with stage I ER/PR positive HER2 negative breast cancer on aromasin is here for follow up.   Patient did not get her BMD as ordered because of anxiety.  Patient denies any worsening hot flashes or swelling in the legs.  Appetite is good.  No weight loss.   Review of Systems  Constitutional:  Positive for malaise/fatigue. Negative for chills, diaphoresis and fever.  HENT:  Negative for nosebleeds and sore throat.   Eyes:  Negative for double vision.  Respiratory:  Negative for cough, hemoptysis, sputum production, shortness of breath and wheezing.   Cardiovascular:  Negative for chest pain, palpitations, orthopnea and leg swelling.  Gastrointestinal:  Negative for abdominal pain, blood in stool, constipation, diarrhea, heartburn, melena, nausea and vomiting.  Genitourinary:  Negative for dysuria, frequency and urgency.  Musculoskeletal:  Positive for back pain and joint pain.  Skin: Negative.  Negative for itching and rash.  Neurological:  Negative for dizziness, tingling, focal weakness, weakness and headaches.   Endo/Heme/Allergies:  Does not bruise/bleed easily.  Psychiatric/Behavioral:  Negative for depression. The patient is not nervous/anxious and does not have insomnia.      MEDICAL HISTORY:  Past Medical History:  Diagnosis Date   Benign tumor of lung    Breast cancer (HCC)    Cancer (HCC)    right breast   CHF (congestive heart failure) (HCC)    Dyspnea    GERD (gastroesophageal reflux disease)    Hyperlipidemia    Hypertension    Insomnia    Osteopenia    Personal history of radiation therapy    Plantar fasciitis    PONV (postoperative nausea and vomiting)    vomiting 2 days after lung surgery   S/P pneumonectomy    right lower lobe   Urinary incontinence     SURGICAL HISTORY: Past Surgical History:  Procedure Laterality Date   BREAST BIOPSY Right 07/17/2019   Affirm bx-"Ribbon" clip-[facility] & DCIS   BREAST BIOPSY Right 07/31/2021   Atereo bx, x-clip, path pending   BREAST LUMPECTOMY Right 2021   [facility], DCIS, 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   bronchoscopy     CATARACT EXTRACTION, BILATERAL     Left- 11/21/15, right- 12/23/15   CHOLECYSTECTOMY     COLONOSCOPY WITH PROPOFOL N/A 06/20/2022   Procedure: COLONOSCOPY WITH PROPOFOL;  Surgeon: Sung Amabile, DO;  Location: [facility] ENDOSCOPY;  Service: General;  Laterality: N/A;   CYSTECTOMY  11/03/2009   cheek   dilation and curretage     EYE SURGERY Bilateral    cataract   FOOT SURGERY Bilateral 2010   LOBECTOMY     LUNG SURGERY Right    PART MASTECTOMY,RADIO FREQUENCY LOCALIZER,AXILLARY SENTINEL NODE BIOPSY Right 08/13/2019   Procedure: PART MASTECTOMY,RADIO FREQUENCY LOCALIZER,AXILLARY SENTINEL NODE BIOPSY;  Surgeon: Sung Amabile, DO;  Location: [facility] ORS;  Service: General;  Laterality: Right;   UPPER GI ENDOSCOPY  2005    SOCIAL HISTORY: Social History   Socioeconomic History   Marital status: Married    Spouse name: Not on file   Number of children: Not on file   Years of education: Not on file   Highest education level:  Some college, no degree  Occupational History   Occupation: retired   Tobacco Use   Smoking status: Former    Current packs/day: 0.00    Types: Cigarettes    Quit date: 07/09/1979    Years since quitting: 43.6   Smokeless tobacco: Never  Vaping Use   Vaping status: Never Used  Substance and Sexual Activity   Alcohol use: No   Drug use: No   Sexual activity: Yes  Other Topics Concern   Not on file  Social History Narrative   Not on file   Social Determinants of Health   Financial Resource Strain: Low Risk  (01/27/2023)   Overall Financial Resource Strain (CARDIA)    Difficulty of Paying Living Expenses: Not hard at all  Food Insecurity: No Food Insecurity (01/27/2023)   Hunger Vital Sign    Worried About Radiation protection practitioner of Food in the Last Year: Never true    Ran Out of Food in the Last Year: Never true  Transportation Needs: No Transportation Needs (01/27/2023)   PRAPARE - Administrator, Civil Service (Medical): No    Lack of Transportation (Non-Medical): No  Physical Activity: Insufficiently Active (01/27/2023)   Exercise Vital Sign    Days of Exercise per Week: 3 days  Minutes of Exercise per Session: 20 min  Stress: No Stress Concern Present (01/27/2023)   [employer] of Occupational Health - Occupational Stress Questionnaire    Feeling of Stress : Only a little  Social Connections: Moderately Integrated (01/27/2023)   Social Connection and Isolation Panel [NHANES]    Frequency of Communication with Friends and Family: More than three times a week    Frequency of Social Gatherings with Friends and Family: More than three times a week    Attends Religious Services: More than 4 times per year    Active Member of [employer] or Organizations: No    Attends Banker Meetings: Never    Marital Status: Married  Catering manager Violence: Not At Risk (06/12/2022)   Humiliation, Afraid, Rape, and Kick questionnaire    Fear of Current or Ex-Partner: No     Emotionally Abused: No    Physically Abused: No    Sexually Abused: No    FAMILY HISTORY: Family History  Problem Relation Age of Onset   COPD Mother    Hypertension Mother    Emphysema Mother    Osteoporosis Mother    Heart disease Father    Hyperlipidemia Sister    Hyperlipidemia Brother    Heart disease Maternal Grandmother    Heart disease Maternal Grandfather    Heart disease Paternal Grandmother    Stroke Paternal Grandmother    Heart disease Paternal Grandfather    Stroke Paternal Grandfather    Cancer Maternal Aunt        breast and lung   Osteoporosis Maternal Aunt    Aortic aneurysm Maternal Aunt    Breast cancer Maternal Aunt 29    ALLERGIES:  is allergic to contrast media [iodinated contrast media], other, ace inhibitors, hctz [hydrochlorothiazide], and keflex [cephalexin].  MEDICATIONS:  Current Outpatient Medications  Medication Sig Dispense Refill   albuterol (VENTOLIN HFA) 108 (90 Base) MCG/ACT inhaler Inhale 2 puffs into the lungs every 6 (six) hours as needed. 18 g 3   amLODipine (NORVASC) 5 MG tablet Take 1 tablet (5 mg total) by mouth daily. 90 tablet 4   azelastine (ASTELIN) 0.1 % nasal spray Place 2 sprays into both nostrils 2 (two) times daily. Use in each nostril as directed     Biotin 5000 MCG TABS Take 5,000 mcg by mouth 2 (two) times daily.     Calcium Carb-Cholecalciferol (CALCIUM-VITAMIN D) 600-400 MG-UNIT TABS Take 1 tablet by mouth 2 (two) times daily.     cholecalciferol (VITAMIN D3) 25 MCG (1000 UNIT) tablet Take 1,000 Units by mouth in the morning and at bedtime.     Cyanocobalamin (B-12) 2500 MCG TABS Take 2,500 mcg by mouth daily.     denosumab (PROLIA) 60 MG/ML SOSY injection Inject 60 mg into the skin every 6 (six) months.     exemestane (AROMASIN) 25 MG tablet Take 1 tablet (25 mg total) by mouth daily after breakfast. 90 tablet 1   ferrous sulfate 220 (44 Fe) MG/5ML solution Take 220 mg by mouth daily.     fluticasone (FLONASE) 50  MCG/ACT nasal spray Place 2 sprays into both nostrils daily. 16 g 6   metoprolol succinate (TOPROL-XL) 25 MG 24 hr tablet Take 1 tablet (25 mg total) by mouth daily. 90 tablet 4   pantoprazole (PROTONIX) 40 MG tablet Take 1 tablet (40 mg total) by mouth 2 (two) times daily. 180 tablet 4   Red Yeast Rice Extract (RED YEAST RICE PO) Take 1 tablet by mouth  in the morning and at bedtime.     spironolactone (ALDACTONE) 25 MG tablet Take 25 mg by mouth daily.     St Johns Wort 300 MG CAPS Take 300 mg by mouth 2 (two) times daily.     TRELEGY ELLIPTA 100-62.5-25 MCG/INH AEPB Inhale 1 puff into the lungs daily.     UPNEEQ 0.1 % SOLN Apply to eye.     valsartan (DIOVAN) 320 MG tablet Take 1 tablet (320 mg total) by mouth daily. 90 tablet 4   No current facility-administered medications for this visit.   Facility-Administered Medications Ordered in Other Visits  Medication Dose Route Frequency Provider Last Rate Last Admin   denosumab (PROLIA) injection 60 mg  60 mg Subcutaneous Once Earna Coder, MD          .  PHYSICAL EXAMINATION: ECOG PERFORMANCE STATUS: 0 - Asymptomatic  Vitals:   02/18/23 1031 02/18/23 1041  BP: (!) 163/71 (!) 142/77  Pulse: 77   Temp: (!) 97 F (36.1 C)   SpO2: 97%      Filed Weights   02/18/23 1031  Weight: 233 lb 6.4 oz (105.9 kg)      Physical Exam Vitals and nursing note reviewed.  Constitutional:      Comments: Alone.  Ambulating independently.      HENT:     Head: Normocephalic and atraumatic.     Mouth/Throat:     Pharynx: Oropharynx is clear.  Eyes:     Extraocular Movements: Extraocular movements intact.     Pupils: Pupils are equal, round, and reactive to light.  Cardiovascular:     Rate and Rhythm: Normal rate and regular rhythm.  Pulmonary:     Comments: Decreased breath sounds bilaterally.  Abdominal:     Palpations: Abdomen is soft.  Musculoskeletal:        General: Normal range of motion.     Cervical back: Normal  range of motion.  Skin:    General: Skin is warm.  Neurological:     General: No focal deficit present.     Mental Status: She is alert and oriented to person, place, and time.  Psychiatric:        Behavior: Behavior normal.        Judgment: Judgment normal.      LABORATORY DATA:  I have reviewed the data as listed Lab Results  Component Value Date   WBC 7.9 02/18/2023   HGB 13.0 02/18/2023   HCT 39.7 02/18/2023   MCV 92.1 02/18/2023   PLT 231 02/18/2023   Recent Labs    08/16/22 1010 01/28/23 1000 02/18/23 1024  NA 137 143 138  K 4.3 4.0 3.7  CL 99 104 103  CO2 26 25 26   GLUCOSE 170* 129* 170*  BUN 19 19 15   CREATININE 1.19* 1.10* 0.97  CALCIUM 9.9 9.7 9.7  GFRNONAA 48*  --  >60  PROT 7.5 6.6 7.2  ALBUMIN 4.1 4.2 4.1  AST 24 13 17   ALT 18 17 17   ALKPHOS 69 86 78  BILITOT 0.7 0.3 0.5    RADIOGRAPHIC STUDIES: I have personally reviewed the radiological images as listed and agreed with the findings in the report. No results found.  ASSESSMENT & PLAN:   Carcinoma of overlapping sites of right breast in female, estrogen receptor positive (HCC)  #2021-Stage Ia right breast cancer status postlumpectomy;  ER/PR positive, HER2/neu negative. s/p adjuvant radiation therapy on 11/17/2019.  Currently on Aromasin [Fall of 2026]. APRIL 2024 -abnormal  mammogram recent breast biopsy-negative for any malignancy.  Stable.   # Patient currently on Aromasin-tolerating well.  No worsening joint pains.  No worsening hot flashes.    # Hot flashes- G-1-2-second Aromasin.   Stable.  #  Osteopenia-bone density scan in April 2021 consistent with osteopenia.   on calcium 1200 mg and vitamin D 1000 IU daily. BMD- not done [sec to anxiety/prednisone] Plan to repeat bone density in April 2025.  will order today. Continue. prolia today.    #   Iron deficiency anemia-history of iron deficiency anemia.  On oral iron.  Not anemic on most recent blood work. 2023-Oct Iron sat 12%; ferritin-wnl.    Continue oral iron in the interim- stable.   # BG-1 pepsi [BG- 170]; slightly elevated creatinine 1.1 GFR 49.  Recently on prednisone for URI.  Also discussed regarding cutting down on sodas and also healthy weight loss.   # B12 deficiency.  History of B12 deficiency; NOV 2023- > 2000-  recommend on oral B12 3 times a week- stable.               # DISPOSITION: # Prolia today-  # in April 2024 Mammogram/ BMD # follow up- in 6 months- MD; cbc/cmp; ca27-29; b12; prolia- Dr.B    All questions were answered. The patient knows to call the clinic with any problems, questions or concerns.    Earna Coder, MD 02/18/2023 10:59 AM

## 2023-02-18 NOTE — Assessment & Plan Note (Addendum)
#  2021-Stage Ia right breast cancer status postlumpectomy;  ER/PR positive, HER2/neu negative. s/p adjuvant radiation therapy on 11/17/2019.  Currently on Aromasin [Fall of 2026]. APRIL 2024 -abnormal mammogram recent breast biopsy-negative for any malignancy.  Stable.   # Patient currently on Aromasin-tolerating well.  No worsening joint pains.  No worsening hot flashes.    # Hot flashes- G-1-2-second Aromasin.   Stable.  #  Osteopenia-bone density scan in April 2021 consistent with osteopenia.   on calcium 1200 mg and vitamin D 1000 IU daily. BMD- not done [sec to anxiety/prednisone] Plan to repeat bone density in April 2025.  will order today. Continue. prolia today.    #   Iron deficiency anemia-history of iron deficiency anemia.  On oral iron.  Not anemic on most recent blood work. 2023-Oct Iron sat 12%; ferritin-wnl.   Continue oral iron in the interim- stable.   # BG-1 pepsi [BG- 170]; slightly elevated creatinine 1.1 GFR 49.  Recently on prednisone for URI.  Also discussed regarding cutting down on sodas and also healthy weight loss.   # B12 deficiency.  History of B12 deficiency; NOV 2023- > 2000-  recommend on oral B12 3 times a week- stable.               # DISPOSITION: # Prolia today-  # in April 2024 Mammogram/ BMD # follow up- in 6 months- MD; cbc/cmp; ca27-29; b12; prolia- Dr.B

## 2023-02-19 LAB — CANCER ANTIGEN 27.29: CA 27.29: 11.8 U/mL (ref 0.0–38.6)

## 2023-02-28 ENCOUNTER — Other Ambulatory Visit: Payer: Self-pay | Admitting: Internal Medicine

## 2023-06-12 ENCOUNTER — Other Ambulatory Visit: Payer: Self-pay | Admitting: Internal Medicine

## 2023-06-13 ENCOUNTER — Encounter: Payer: Self-pay | Admitting: Internal Medicine

## 2023-06-18 ENCOUNTER — Ambulatory Visit (INDEPENDENT_AMBULATORY_CARE_PROVIDER_SITE_OTHER): Payer: Medicare Other

## 2023-06-18 VITALS — BP 153/82 | Ht 67.0 in | Wt 233.0 lb

## 2023-06-18 DIAGNOSIS — Z0000 Encounter for general adult medical examination without abnormal findings: Secondary | ICD-10-CM

## 2023-06-18 NOTE — Patient Instructions (Signed)
 Melissa Merritt , Thank you for taking time to come for your Medicare Wellness Visit. I appreciate your ongoing commitment to your health goals. Please review the following plan we discussed and let me know if I can assist you in the future.   Referrals/Orders/Follow-Ups/Clinician Recommendations: It was nice speaking with you today. Remember to keep you appointment with Jolene on 4/21 at 9:40 am and your Bone Density and Mammogram appointments on 4/23 at 10 am. If you have increased trouble with anxiety, please call us sooner. Remember to stop by the pharmacy to ask about Pneumonia and Shingles vaccines.  This is a list of the screening recommended for you and due dates:  Health Maintenance  Topic Date Due   Zoster (Shingles) Vaccine (1 of 2) 12/01/1969   COVID-19 Vaccine (4 - 2024-25 season) 12/09/2022   Medicare Annual Wellness Visit  06/17/2024   Mammogram  07/29/2024   DEXA scan (bone density measurement)  08/03/2024   DTaP/Tdap/Td vaccine (3 - Td or Tdap) 12/26/2025   Colon Cancer Screening  06/20/2027   Pneumonia Vaccine  Completed   Flu Shot  Completed   Hepatitis C Screening  Completed   HPV Vaccine  Aged Out    Advanced directives: (In Chart) A copy of your advanced directives are scanned into your chart should your provider ever need it. Try to update this next year.  Next Medicare Annual Wellness Visit scheduled for next year: Yes

## 2023-06-18 NOTE — Progress Notes (Signed)
 Subjective:   Melissa Merritt is a 73 y.o. who presents for a Medicare Wellness preventive visit.  Visit Complete: Virtual I connected with  Melissa Merritt on 06/18/23 by a audio enabled telemedicine application and verified that I am speaking with the correct person using two identifiers.  Patient Location: Home  Provider Location: Office/Clinic  I discussed the limitations of evaluation and management by telemedicine. The patient expressed understanding and agreed to proceed.  Vital Signs: Because this visit was a virtual/telehealth visit, some criteria may be missing or patient reported. Any vitals not documented were not able to be obtained and vitals that have been documented are patient reported.  VideoDeclined- This patient declined Librarian, academic. Therefore the visit was completed with audio only.  AWV Questionnaire: Yes: Patient Medicare AWV questionnaire was completed by the patient on 06/15/2023; I have confirmed that all information answered by patient is correct and no changes since this date.  Cardiac Risk Factors include: advanced age (>24men, >78 women);obesity (BMI >30kg/m2);dyslipidemia;hypertension     Objective:    Today's Vitals   06/18/23 1001  BP: (!) 153/82  Weight: 233 lb (105.7 kg)  Height: 5\' 7"  (1.702 m)   Body mass index is 36.49 kg/m.     06/18/2023   10:17 AM 02/18/2023   10:34 AM 08/16/2022   10:51 AM 06/20/2022    7:01 AM 06/12/2022   10:58 AM 02/15/2022   10:15 AM 02/05/2022    9:33 AM  Advanced Directives  Does Patient Have a Medical Advance Directive? Yes Yes Yes Yes Yes Yes Yes  Type of Estate agent of [place];Living will Healthcare Power of [place];Living will Healthcare Power of [place];Living will Healthcare Power of [place];Living will Healthcare Power of [place];Living will  Healthcare Power of [place];Living will  Does patient want to make changes to medical advance directive?  No - Patient declined    No - Patient declined  No - Patient declined  Copy of Healthcare Power of Attorney in Chart? Yes - validated most recent copy scanned in chart (See row information)  Yes - validated most recent copy scanned in chart (See row information) No - copy requested Yes - validated most recent copy scanned in chart (See row information)  No - copy requested    Current Medications (verified) Outpatient Encounter Medications as of 06/18/2023  Medication Sig   albuterol (VENTOLIN HFA) 108 (90 Base) MCG/ACT inhaler Inhale 2 puffs into the lungs every 6 (six) hours as needed.   amLODipine (NORVASC) 5 MG tablet Take 1 tablet (5 mg total) by mouth daily.   azelastine (ASTELIN) 0.1 % nasal spray Place 2 sprays into both nostrils 2 (two) times daily. Use in each nostril as directed   Biotin 5000 MCG TABS Take 5,000 mcg by mouth 2 (two) times daily.   Calcium Carb-Cholecalciferol (CALCIUM-VITAMIN D) 600-400 MG-UNIT TABS Take 1 tablet by mouth 2 (two) times daily.   cholecalciferol (VITAMIN D3) 25 MCG (1000 UNIT) tablet Take 1,000 Units by mouth in the morning and at bedtime.   Cyanocobalamin (B-12) 2500 MCG TABS Take 2,500 mcg by mouth daily.   denosumab (PROLIA) 60 MG/ML SOSY injection Inject 60 mg into the skin every 6 (six) months.   exemestane (AROMASIN) 25 MG tablet Take 1 tablet (25 mg total) by mouth daily after breakfast.   ferrous sulfate 220 (44 Fe) MG/5ML solution Take 220 mg by mouth daily.   fluticasone (FLONASE) 50 MCG/ACT nasal spray Place 2 sprays into  both nostrils daily.   metoprolol succinate (TOPROL-XL) 25 MG 24 hr tablet Take 1 tablet (25 mg total) by mouth daily.   pantoprazole (PROTONIX) 40 MG tablet Take 1 tablet (40 mg total) by mouth 2 (two) times daily.   Red Yeast Rice Extract (RED YEAST RICE PO) Take 1 tablet by mouth in the morning and at bedtime.   spironolactone (ALDACTONE) 25 MG tablet Take 25 mg by mouth daily.   St Johns Wort 300 MG CAPS Take 300 mg by  mouth 2 (two) times daily.   TRELEGY ELLIPTA 100-62.5-25 MCG/INH AEPB Inhale 1 puff into the lungs daily.   UPNEEQ 0.1 % SOLN Apply to eye.   valsartan (DIOVAN) 320 MG tablet Take 1 tablet (320 mg total) by mouth daily.   No facility-administered encounter medications on file as of 06/18/2023.    Allergies (verified) Contrast media [iodinated contrast media], Other, Ace inhibitors, Hctz [hydrochlorothiazide], and Keflex [cephalexin]   History: Past Medical History:  Diagnosis Date   Benign tumor of lung    Breast cancer (HCC)    Cancer (HCC)    right breast   CHF (congestive heart failure) (HCC)    Dyspnea    GERD (gastroesophageal reflux disease)    Hyperlipidemia    Hypertension    Insomnia    Osteopenia    Personal history of radiation therapy    Plantar fasciitis    PONV (postoperative nausea and vomiting)    vomiting 2 days after lung surgery   S/P pneumonectomy    right lower lobe   Urinary incontinence    Past Surgical History:  Procedure Laterality Date   BREAST BIOPSY Right 07/17/2019   Affirm bx-"Ribbon" clip-[facility] & [facility]   BREAST BIOPSY Right 07/31/2021   Atereo bx, x-clip, path pending   BREAST LUMPECTOMY Right 2021   [facility], [facility], 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   bronchoscopy     CATARACT EXTRACTION, BILATERAL     Left- 11/21/15, right- 12/23/15   CHOLECYSTECTOMY     COLONOSCOPY WITH PROPOFOL N/A 06/20/2022   Procedure: COLONOSCOPY WITH PROPOFOL;  Surgeon: Sung Amabile, DO;  Location: [facility] ENDOSCOPY;  Service: General;  Laterality: N/A;   CYSTECTOMY  11/03/2009   cheek   dilation and curretage     EYE SURGERY Bilateral    cataract   FOOT SURGERY Bilateral 2010   LOBECTOMY     LUNG SURGERY Right    PART MASTECTOMY,RADIO FREQUENCY LOCALIZER,AXILLARY SENTINEL NODE BIOPSY Right 08/13/2019   Procedure: PART MASTECTOMY,RADIO FREQUENCY LOCALIZER,AXILLARY SENTINEL NODE BIOPSY;  Surgeon: Sung Amabile, DO;  Location: [facility] ORS;  Service: General;  Laterality: Right;    UPPER GI ENDOSCOPY  2005   Family History  Problem Relation Age of Onset   COPD Mother    Hypertension Mother    Emphysema Mother    Osteoporosis Mother    Heart disease Father    Hyperlipidemia Sister    Hyperlipidemia Brother    Heart disease Maternal Grandmother    Heart disease Maternal Grandfather    Heart disease Paternal Grandmother    Stroke Paternal Grandmother    Heart disease Paternal Grandfather    Stroke Paternal Grandfather    Cancer Maternal Aunt        breast and lung   Osteoporosis Maternal Aunt    Aortic aneurysm Maternal Aunt    Breast cancer Maternal Aunt 28   Social History   Socioeconomic History   Marital status: Married    Spouse name: Lenna Gilford   Number of  children: 3   Years of education: Not on file   Highest education level: Some college, no degree  Occupational History   Occupation: retired   Tobacco Use   Smoking status: Former    Current packs/day: 0.00    Average packs/day: 2.0 packs/day for 10.2 years (20.5 ttl pk-yrs)    Types: Cigarettes    Start date: 21    Quit date: 07/09/1979    Years since quitting: 43.9   Smokeless tobacco: Never  Vaping Use   Vaping status: Never Used  Substance and Sexual Activity   Alcohol use: No   Drug use: No   Sexual activity: Yes  Other Topics Concern   Not on file  Social History Narrative   Son lives next door - another son and daughter live out of town   Social Drivers of Corporate investment banker Strain: Low Risk  (06/15/2023)   Overall Financial Resource Strain (CARDIA)    Difficulty of Paying Living Expenses: Not hard at all  Food Insecurity: No Food Insecurity (06/15/2023)   Hunger Vital Sign    Worried About Running Out of Food in the Last Year: Never true    Ran Out of Food in the Last Year: Never true  Transportation Needs: No Transportation Needs (06/15/2023)   PRAPARE - Administrator, Civil Service (Medical): No    Lack of Transportation (Non-Medical): No  Physical  Activity: Inactive (06/15/2023)   Exercise Vital Sign    Days of Exercise per Week: 0 days    Minutes of Exercise per Session: 0 min  Stress: Stress Concern Present (06/15/2023)   [employer] of Occupational Health - Occupational Stress Questionnaire    Feeling of Stress : Rather much  Social Connections: Moderately Integrated (06/15/2023)   Social Connection and Isolation Panel [NHANES]    Frequency of Communication with Friends and Family: More than three times a week    Frequency of Social Gatherings with Friends and Family: More than three times a week    Attends Religious Services: More than 4 times per year    Active Member of [employer] or Organizations: No    Attends Engineer, structural: Never    Marital Status: Married    Tobacco Counseling Counseling given: Not Answered    Clinical Intake:  Pre-visit preparation completed: Yes  Pain : No/denies pain     BMI - recorded: 36.49 Nutritional Status: BMI > 30  Obese Diabetes: No  How often do you need to have someone help you when you read instructions, pamphlets, or other written materials from your doctor or pharmacy?: 1 - Never  Interpreter Needed?: No  Information entered by :: Audrie Kuri, LPN   Activities of Daily Living     06/15/2023   10:59 PM  In your present state of health, do you have any difficulty performing the following activities:  Hearing? 0  Vision? 0  Difficulty concentrating or making decisions? 0  Walking or climbing stairs? 0  Dressing or bathing? 0  Doing errands, shopping? 0  Preparing Food and eating ? N  Using the Toilet? N  In the past six months, have you accidently leaked urine? Y  Comment mild - doesn't want to have surgery - wears pads  Do you have problems with loss of bowel control? N  Managing your Medications? N  Managing your Finances? N  Housekeeping or managing your Housekeeping? N    Patient Care Team: Domingo Madeira, OD (Optometry) Paraschos,  Lyn Hollingshead, MD  as Consulting Physician (Cardiology) Leanora Ivanoff, PA-C Jesusita Oka, MD (Dermatology) Scarlett Presto, RN (Inactive) as Oncology Nurse Navigator Sung Amabile, DO as Consulting Physician (Surgery) Carmina Miller, MD as Radiation Oncologist (Radiation Oncology) Earna Coder, MD as Consulting Physician (Hematology and Oncology) Vida Rigger, MD as Consulting Physician (Pulmonary Disease)  Indicate any recent Medical Services you may have received from other than [facility] providers in the past year (date may be approximate).     Assessment:   This is a routine wellness examination for Shey.  Hearing/Vision screen Hearing Screening - Comments:: Mild hearing difficulties per patient - will make appt with audiologist Vision Screening - Comments:: Wears otc glasses - up to date with routine eye exams with Dr Larence Penning   Goals Addressed             This Visit's Progress    COMPLETED: DIET - EAT MORE FRUITS AND VEGETABLES       COMPLETED: DIET - INCREASE WATER INTAKE       Recommend drinking at least 6-8 glasses of water a day      Exercise 3x per week (30 min per time)       COMPLETED: Patient Stated       05/30/2020, wants to weigh 180 pounds     COMPLETED: Patient Stated       Drink more water     Patient Stated       Wants to get a hearing aid       Depression Screen     06/18/2023   10:08 AM 01/28/2023    9:24 AM 08/28/2022   11:04 AM 07/03/2022    9:31 AM 06/12/2022   10:57 AM 05/24/2022    9:12 AM 05/15/2022    3:03 PM  PHQ 2/9 Scores  PHQ - 2 Score 0 1 0 0 0 0 0  PHQ- 9 Score 6 4 2 3  0 3 3    Fall Risk     06/15/2023   10:59 PM 01/28/2023    9:23 AM 08/28/2022   11:03 AM 07/03/2022    9:31 AM 06/12/2022   10:59 AM  Fall Risk   Falls in the past year? 0 0 0 0 0  Number falls in past yr: 0 0 0 0 0  Injury with Fall? 0 0 0 0 0  Risk for fall due to : No Fall Risks No Fall Risks No Fall Risks No Fall Risks No Fall Risks  Follow up Falls evaluation  completed Falls evaluation completed Falls evaluation completed Falls evaluation completed Falls prevention discussed;Falls evaluation completed    MEDICARE RISK AT HOME:  Medicare Risk at Home Any stairs in or around the home?: (Patient-Rptd) Yes If so, are there any without handrails?: (Patient-Rptd) No Home free of loose throw rugs in walkways, pet beds, electrical cords, etc?: (Patient-Rptd) Yes Adequate lighting in your home to reduce risk of falls?: (Patient-Rptd) Yes Life alert?: (Patient-Rptd) No Use of a cane, walker or w/c?: (Patient-Rptd) No Grab bars in the bathroom?: (Patient-Rptd) Yes Shower chair or bench in shower?: (Patient-Rptd) Yes Elevated toilet seat or a handicapped toilet?: (Patient-Rptd) Yes  TIMED UP AND GO:  Was the test performed?  No  Cognitive Function: 6CIT completed        06/18/2023   10:19 AM 06/12/2022   11:04 AM 05/30/2020    9:05 AM 05/25/2019    9:12 AM 05/19/2018   10:03 AM  6CIT Screen  What Year? 0  points 0 points 0 points 0 points 0 points  What month? 0 points 0 points 0 points 0 points 0 points  What time? 0 points 0 points 0 points 0 points 0 points  Count back from 20 0 points 0 points 0 points 0 points 0 points  Months in reverse 0 points 0 points 0 points 0 points 0 points  Repeat phrase 0 points 0 points 0 points 0 points 0 points  Total Score 0 points 0 points 0 points 0 points 0 points    Immunizations Immunization History  Administered Date(s) Administered   Fluad Quad(high Dose 65+) 02/24/2019   Fluad Trivalent(High Dose 65+) 01/28/2023   Influenza, High Dose Seasonal PF 12/27/2015, 01/15/2018, 01/16/2022   Influenza,inj,Quad PF,6+ Mos 01/21/2015   Influenza-Unspecified 02/08/2017, 02/09/2021   Moderna Sars-Covid-2 Vaccination 06/10/2019, 07/07/2019, 04/28/2020   Pneumococcal Conjugate-13 05/19/2014   Pneumococcal Polysaccharide-23 01/14/2008, 05/23/2015   Td 09/11/2005   Tdap 12/27/2015   Zoster, Live 09/04/2011     Screening Tests Health Maintenance  Topic Date Due   Zoster Vaccines- Shingrix (1 of 2) 12/01/1967   COVID-19 Vaccine (4 - 2024-25 season) 12/09/2022   Medicare Annual Wellness (AWV)  06/17/2024   MAMMOGRAM  07/29/2024   DEXA SCAN  08/03/2024   DTaP/Tdap/Td (3 - Td or Tdap) 12/26/2025   Colonoscopy  06/20/2027   Pneumonia Vaccine 72+ Years old  Completed   INFLUENZA VACCINE  Completed   Hepatitis C Screening  Completed   HPV VACCINES  Aged Out    Health Maintenance  Health Maintenance Due  Topic Date Due   Zoster Vaccines- Shingrix (1 of 2) 12/01/1967   COVID-19 Vaccine (4 - 2024-25 season) 12/09/2022   Health Maintenance Items Addressed: Has appt for Mammo and DEXA. Will go to pharmacy to discuss PNA and SHX.  Additional Screening:  Vision Screening: Recommended annual ophthalmology exams for early detection of glaucoma and other disorders of the eye.  Dental Screening: Recommended annual dental exams for proper oral hygiene  Community Resource Referral / Chronic Care Management: CRR required this visit?  No   CCM required this visit?  No     Plan:     I have personally reviewed and noted the following in the patient's chart:   Medical and social history Use of alcohol, tobacco or illicit drugs  Current medications and supplements including opioid prescriptions. Patient is not currently taking opioid prescriptions. Functional ability and status Nutritional status Physical activity Advanced directives List of other physicians Hospitalizations, surgeries, and ER visits in previous 12 months Vitals Screenings to include cognitive, depression, and falls Referrals and appointments  In addition, I have reviewed and discussed with patient certain preventive protocols, quality metrics, and best practice recommendations. A written personalized care plan for preventive services as well as general preventive health recommendations were provided to patient.      Derrien Anschutz E Clara Herbison, LPN   8/75/6433   After Visit Summary: (MyChart) Due to this being a telephonic visit, the after visit summary with patients personalized plan was offered to patient via MyChart   Notes: Please refer to Routing Comments.

## 2023-07-08 ENCOUNTER — Other Ambulatory Visit: Payer: Self-pay | Admitting: Nurse Practitioner

## 2023-07-09 NOTE — Telephone Encounter (Signed)
 Requested Prescriptions  Pending Prescriptions Disp Refills   metoprolol succinate (TOPROL-XL) 25 MG 24 hr tablet [Pharmacy Med Name: METOPROLOL SUCC ER 25 MG TAB] 90 tablet 0    Sig: Take 1 tablet (25 mg total) by mouth daily.     Cardiovascular:  Beta Blockers Failed - 07/09/2023  4:08 PM      Failed - Last BP in normal range    BP Readings from Last 1 Encounters:  06/18/23 (!) 153/82         Passed - Last Heart Rate in normal range    Pulse Readings from Last 1 Encounters:  02/18/23 77         Passed - Valid encounter within last 6 months    Recent Outpatient Visits   None     Future Appointments             In 2 weeks [name], Dorie Rank, NP [facility]

## 2023-07-11 DIAGNOSIS — Z6835 Body mass index (BMI) 35.0-35.9, adult: Secondary | ICD-10-CM | POA: Diagnosis not present

## 2023-07-11 DIAGNOSIS — I519 Heart disease, unspecified: Secondary | ICD-10-CM | POA: Diagnosis not present

## 2023-07-11 DIAGNOSIS — R0602 Shortness of breath: Secondary | ICD-10-CM | POA: Diagnosis not present

## 2023-07-11 DIAGNOSIS — I10 Essential (primary) hypertension: Secondary | ICD-10-CM | POA: Diagnosis not present

## 2023-07-11 DIAGNOSIS — R600 Localized edema: Secondary | ICD-10-CM | POA: Diagnosis not present

## 2023-07-11 DIAGNOSIS — E66812 Obesity, class 2: Secondary | ICD-10-CM | POA: Diagnosis not present

## 2023-07-11 DIAGNOSIS — J449 Chronic obstructive pulmonary disease, unspecified: Secondary | ICD-10-CM | POA: Diagnosis not present

## 2023-07-22 ENCOUNTER — Other Ambulatory Visit: Payer: Self-pay | Admitting: Nurse Practitioner

## 2023-07-22 ENCOUNTER — Encounter: Payer: Self-pay | Admitting: Internal Medicine

## 2023-07-23 NOTE — Telephone Encounter (Signed)
 Requested Prescriptions  Pending Prescriptions Disp Refills   pantoprazole (PROTONIX) 40 MG tablet [Pharmacy Med Name: PANTOPRAZOLE SOD DR 40 MG TAB] 180 tablet 0    Sig: Take 1 tablet (40 mg total) by mouth 2 (two) times daily.     Gastroenterology: Proton Pump Inhibitors Passed - 07/23/2023  2:02 PM      Passed - Valid encounter within last 12 months    Recent Outpatient Visits   None     Future Appointments             In 6 days [name], Jolene T, NP [facility]

## 2023-07-27 NOTE — Patient Instructions (Signed)
 Be Involved in Caring For Your Health:  Taking Medications When medications are taken as directed, they can greatly improve your health. But if they are not taken as prescribed, they may not work. In some cases, not taking them correctly can be harmful. To help ensure your treatment remains effective and safe, understand your medications and how to take them. Bring your medications to each visit for review by your provider.  Your lab results, notes, and after visit summary will be available on [name]. We strongly encourage you to use this feature. If lab results are abnormal the clinic will contact you with the appropriate steps. If the clinic does not contact you assume the results are satisfactory. You can always view your results on [name]. If you have questions regarding your health or results, please contact the clinic during office hours. You can also ask questions on [name].  We at [facility] are grateful that you chose [name] to provide your care. We strive to provide evidence-based and compassionate care and are always looking for feedback. If you get a survey from the clinic please complete this so we can hear your opinions.  Heart-Healthy Eating Plan Many factors influence your heart health, including eating and exercise habits. Heart health is also called coronary health. Coronary risk increases with abnormal blood fat (lipid) levels. A heart-healthy eating plan includes limiting unhealthy fats, increasing healthy fats, limiting salt (sodium) intake, and making other diet and lifestyle changes. What is my plan? Your health care provider may recommend that: You limit your fat intake to _________% or less of your total calories each day. You limit your saturated fat intake to _________% or less of your total calories each day. You limit the amount of cholesterol in your diet to less than _________ mg per day. You limit the amount of sodium in your diet to less than _________  mg per day. What are tips for following this plan? Cooking Cook foods using methods other than frying. Baking, boiling, grilling, and broiling are all good options. Other ways to reduce fat include: Removing the skin from poultry. Removing all visible fats from meats. Steaming vegetables in water or broth. Meal planning  At meals, imagine dividing your plate into fourths: Fill one-half of your plate with vegetables and green salads. Fill one-fourth of your plate with whole grains. Fill one-fourth of your plate with lean protein foods. Eat 2-4 cups of vegetables per day. One cup of vegetables equals 1 cup (91 g) broccoli or cauliflower florets, 2 medium carrots, 1 large bell pepper, 1 large sweet potato, 1 large tomato, 1 medium white potato, 2 cups (150 g) raw leafy greens. Eat 1-2 cups of fruit per day. One cup of fruit equals 1 small apple, 1 large banana, 1 cup (237 g) mixed fruit, 1 large orange,  cup (82 g) dried fruit, 1 cup (240 mL) 100% fruit juice. Eat more foods that contain soluble fiber. Examples include apples, broccoli, carrots, beans, peas, and barley. Aim to get 25-30 g of fiber per day. Increase your consumption of legumes, nuts, and seeds to 4-5 servings per week. One serving of dried beans or legumes equals  cup (90 g) cooked, 1 serving of nuts is  oz (12 almonds, 24 pistachios, or 7 walnut halves), and 1 serving of seeds equals  oz (8 g). Fats Choose healthy fats more often. Choose monounsaturated and polyunsaturated fats, such as olive and canola oils, avocado oil, flaxseeds, walnuts, almonds, and seeds. Eat  more omega-3 fats. Choose salmon, mackerel, sardines, tuna, flaxseed oil, and ground flaxseeds. Aim to eat fish at least 2 times each week. Check food labels carefully to identify foods with trans fats or high amounts of saturated fat. Limit saturated fats. These are found in animal products, such as meats, butter, and cream. Plant sources of saturated fats  include palm oil, palm kernel oil, and coconut oil. Avoid foods with partially hydrogenated oils in them. These contain trans fats. Examples are stick margarine, some tub margarines, cookies, crackers, and other baked goods. Avoid fried foods. General information Eat more home-cooked food and less restaurant, buffet, and fast food. Limit or avoid alcohol. Limit foods that are high in added sugar and simple starches such as foods made using white refined flour (white breads, pastries, sweets). Lose weight if you are overweight. Losing just 5-10% of your body weight can help your overall health and prevent diseases such as diabetes and heart disease. Monitor your sodium intake, especially if you have high blood pressure. Talk with your health care provider about your sodium intake. Try to incorporate more vegetarian meals weekly. What foods should I eat? Fruits All fresh, canned (in natural juice), or frozen fruits. Vegetables Fresh or frozen vegetables (raw, steamed, roasted, or grilled). Green salads. Grains Most grains. Choose whole wheat and whole grains most of the time. Rice and pasta, including brown rice and pastas made with whole wheat. Meats and other proteins Lean, well-trimmed beef, veal, pork, and lamb. Chicken and [place] without skin. All fish and shellfish. Wild duck, rabbit, pheasant, and venison. Egg whites or low-cholesterol egg substitutes. Dried beans, peas, lentils, and tofu. Seeds and most nuts. Dairy Low-fat or nonfat cheeses, including ricotta and mozzarella. Skim or 1% milk (liquid, powdered, or evaporated). Buttermilk made with low-fat milk. Nonfat or low-fat yogurt. Fats and oils Non-hydrogenated (trans-free) margarines. Vegetable oils, including soybean, sesame, sunflower, olive, avocado, peanut, safflower, corn, canola, and cottonseed. Salad dressings or mayonnaise made with a vegetable oil. Beverages Water (mineral or sparkling). Coffee and tea. Unsweetened ice  tea. Diet beverages. Sweets and desserts Sherbet, gelatin, and fruit ice. Small amounts of dark chocolate. Limit all sweets and desserts. Seasonings and condiments All seasonings and condiments. The items listed above may not be a complete list of foods and beverages you can eat. Contact a dietitian for more options. What foods should I avoid? Fruits Canned fruit in heavy syrup. Fruit in cream or butter sauce. Fried fruit. Limit coconut. Vegetables Vegetables cooked in cheese, cream, or butter sauce. Fried vegetables. Grains Breads made with saturated or trans fats, oils, or whole milk. Croissants. Sweet rolls. Donuts. High-fat crackers, such as cheese crackers and chips. Meats and other proteins Fatty meats, such as hot dogs, ribs, sausage, bacon, rib-eye roast or steak. High-fat deli meats, such as salami and bologna. Caviar. Domestic duck and goose. Organ meats, such as liver. Dairy Cream, sour cream, cream cheese, and creamed cottage cheese. Whole-milk cheeses. Whole or 2% milk (liquid, evaporated, or condensed). Whole buttermilk. Cream sauce or high-fat cheese sauce. Whole-milk yogurt. Fats and oils Meat fat, or shortening. Cocoa butter, hydrogenated oils, palm oil, coconut oil, palm kernel oil. Solid fats and shortenings, including bacon fat, salt pork, lard, and butter. Nondairy cream substitutes. Salad dressings with cheese or sour cream. Beverages Regular sodas and any drinks with added sugar. Sweets and desserts Frosting. Pudding. Cookies. Cakes. Pies. Milk chocolate or white chocolate. Buttered syrups. Full-fat ice cream or ice cream drinks. The items listed above may  not be a complete list of foods and beverages to avoid. Contact a dietitian for more information. Summary Heart-healthy meal planning includes limiting unhealthy fats, increasing healthy fats, limiting salt (sodium) intake and making other diet and lifestyle changes. Lose weight if you are overweight. Losing just  5-10% of your body weight can help your overall health and prevent diseases such as diabetes and heart disease. Focus on eating a balance of foods, including fruits and vegetables, low-fat or nonfat dairy, lean protein, nuts and legumes, whole grains, and heart-healthy oils and fats. This information is not intended to replace advice given to you by your health care provider. Make sure you discuss any questions you have with your health care provider. Document Revised: 05/01/2021 Document Reviewed: 05/01/2021 Elsevier Patient Education  2024 [employer].

## 2023-07-29 ENCOUNTER — Ambulatory Visit (INDEPENDENT_AMBULATORY_CARE_PROVIDER_SITE_OTHER): Payer: Self-pay | Admitting: Nurse Practitioner

## 2023-07-29 ENCOUNTER — Encounter: Payer: Self-pay | Admitting: Nurse Practitioner

## 2023-07-29 VITALS — BP 142/74 | HR 71 | Temp 98.7°F | Ht 67.0 in | Wt 235.8 lb

## 2023-07-29 DIAGNOSIS — J4489 Other specified chronic obstructive pulmonary disease: Secondary | ICD-10-CM

## 2023-07-29 DIAGNOSIS — Z79811 Long term (current) use of aromatase inhibitors: Secondary | ICD-10-CM | POA: Diagnosis not present

## 2023-07-29 DIAGNOSIS — G4733 Obstructive sleep apnea (adult) (pediatric): Secondary | ICD-10-CM | POA: Diagnosis not present

## 2023-07-29 DIAGNOSIS — D509 Iron deficiency anemia, unspecified: Secondary | ICD-10-CM

## 2023-07-29 DIAGNOSIS — C50811 Malignant neoplasm of overlapping sites of right female breast: Secondary | ICD-10-CM | POA: Diagnosis not present

## 2023-07-29 DIAGNOSIS — Z170 Estrogen receptor positive status [ER+]: Secondary | ICD-10-CM

## 2023-07-29 DIAGNOSIS — E782 Mixed hyperlipidemia: Secondary | ICD-10-CM | POA: Diagnosis not present

## 2023-07-29 DIAGNOSIS — R7301 Impaired fasting glucose: Secondary | ICD-10-CM | POA: Diagnosis not present

## 2023-07-29 DIAGNOSIS — I10 Essential (primary) hypertension: Secondary | ICD-10-CM

## 2023-07-29 DIAGNOSIS — K219 Gastro-esophageal reflux disease without esophagitis: Secondary | ICD-10-CM

## 2023-07-29 DIAGNOSIS — M8588 Other specified disorders of bone density and structure, other site: Secondary | ICD-10-CM | POA: Diagnosis not present

## 2023-07-29 DIAGNOSIS — E66812 Obesity, class 2: Secondary | ICD-10-CM | POA: Diagnosis not present

## 2023-07-29 LAB — MICROALBUMIN, URINE WAIVED
Creatinine, Urine Waived: 200 mg/dL (ref 10–300)
Microalb, Ur Waived: 30 mg/L — ABNORMAL HIGH (ref 0–19)
Microalb/Creat Ratio: <30 mg/g (ref <30)

## 2023-07-29 LAB — BAYER DCA HB A1C WAIVED: HB A1C (BAYER DCA - WAIVED): 6 % — ABNORMAL HIGH (ref 4.8–5.6)

## 2023-07-29 MED ORDER — ALPRAZOLAM 0.5 MG PO TABS: ORAL_TABLET | ORAL | 0 refills | Status: DC

## 2023-07-29 MED ORDER — VALSARTAN 320 MG PO TABS: 320.0000 mg | ORAL_TABLET | Freq: Every day | ORAL | 4 refills | Status: AC

## 2023-07-29 MED ORDER — AMLODIPINE BESYLATE 5 MG PO TABS: 5.0000 mg | ORAL_TABLET | Freq: Every day | ORAL | 4 refills | Status: AC

## 2023-07-29 MED ORDER — PANTOPRAZOLE SODIUM 40 MG PO TBEC: 40.0000 mg | DELAYED_RELEASE_TABLET | Freq: Two times a day (BID) | ORAL | 4 refills | Status: AC

## 2023-07-29 NOTE — Assessment & Plan Note (Signed)
 A1c 6.0% today. Recommend to continue focus on diet at home.

## 2023-07-29 NOTE — Assessment & Plan Note (Signed)
 Chronic, ongoing. Patient continues to refuse statin, does not want to take. Continues red yeast rice daily. Labs: Lipid panel.

## 2023-07-29 NOTE — Assessment & Plan Note (Addendum)
 Chronic. DEXA scheduled this week. Reports anxiety about up coming scan. Recommend discussing with oncology about medication for anxiety relief prior to scan. Recommend to continue Vitamin D  daily and Prolia . Will continue collaboration with oncology.   -Labs: Vitamin D25.

## 2023-07-29 NOTE — Assessment & Plan Note (Signed)
 Ongoing. Continue collaboration with oncology. Continue medications as prescribed by them. Dexa scheduled for this week. Has increased anxiety concerning the scan.

## 2023-07-29 NOTE — Assessment & Plan Note (Signed)
 Chronic, ongoing. Not using her CPAP, doesn't like the mask. Recommend she use her CPAP 100% of time.

## 2023-07-29 NOTE — Assessment & Plan Note (Signed)
 Chronic, stable on Protonix . Recommend to continue medication regimen. Magnesium level checked today. Patient understands risks of PPI, including bone loss, C. Diff diarrhea, infections, CKD, electrolyte abnormalities, and pneumonia.

## 2023-07-29 NOTE — Assessment & Plan Note (Signed)
 BMI 36.93. Recommend eating smaller more frequent high protein, low fat meals. Exercising 5 days per week for 30 min per day. Recommend increase water intake and decrease carbonated drinks. Patient verbalized understanding of these recommendations.

## 2023-07-29 NOTE — Assessment & Plan Note (Signed)
 Ongoing. Followed by oncology. Will continue collaboration with oncology.

## 2023-07-29 NOTE — Progress Notes (Deleted)
 BP (!) 150/72   Pulse 80   Temp 98.7 F (37.1 C) (Oral)   Ht 5\' 7"  (1.702 m)   Wt 235 lb 12.8 oz (107 kg)   SpO2 92%   BMI 36.93 kg/m    Subjective:    Patient ID: Melissa Merritt, female    DOB: 08/21/1950, 73 y.o.   MRN: [id number]  HPI: Melissa Merritt is a 73 y.o. female  Chief Complaint  Patient presents with   Hypertension   Hyperlipidemia   HYPERTENSION / HYPERLIPIDEMIA Continues Valsartan , Metoprolol , Amlodipine , Spironolactone. Echo 06/15/22 with LV function LVEF 45-50% with normal LVH which is consistent with previous echos, going back to 2013. Last saw cardiology on 07/11/23 with no changes.   She continues to refuse statin therapy, even after at length discussions about risk reduction with PCP and cardiology.  Is taking red yeast rice.   Satisfied with current treatment? {Blank single:19197::"yes","no"} Duration of hypertension: {Blank single:19197::"chronic","months","years"} BP monitoring frequency: {Blank single:19197::"not checking","rarely","daily","weekly","monthly","a few times a day","a few times a week","a few times a month"} BP range:  BP medication side effects: {Blank single:19197::"yes","no"} Duration of hyperlipidemia: {Blank single:19197::"chronic","months","years"} Cholesterol medication side effects: {Blank single:19197::"yes","no"} Cholesterol supplements: {Blank multiple:19196::"none","fish oil","niacin","red yeast rice"} Medication compliance: {Blank single:19197::"excellent compliance","good compliance","fair compliance","poor compliance"} Aspirin: {Blank single:19197::"yes","no"} Recent stressors: {Blank single:19197::"yes","no"} Recurrent headaches: {Blank single:19197::"yes","no"} Visual changes: {Blank single:19197::"yes","no"} Palpitations: {Blank single:19197::"yes","no"} Dyspnea: {Blank single:19197::"yes","no"} Chest pain: {Blank single:19197::"yes","no"} Lower extremity edema: {Blank single:19197::"yes","no"} Dizzy/lightheaded: {Blank  single:19197::"yes","no"}   COPD (Stage 2) Uses Trelegy. Tried Breztri but did not tolerate as well.  Completed pulmonary rehab in December 2022.  Last exacerbation treated 10/16/22, treated with Zpack and steroid.  Has OSA and uses CPAP. COPD status: {Blank single:19197::"controlled","uncontrolled","better","worse","exacerbated","stable"} Satisfied with current treatment?: {Blank single:19197::"yes","no"} Oxygen use: {Blank single:19197::"yes","no"} Dyspnea frequency:  Cough frequency:  Rescue inhaler frequency:   Limitation of activity: {Blank single:19197::"yes","no"} Productive cough:  Last Spirometry:  Pneumovax: {Blank single:19197::"Up to Date","Not up to Date","unknown"} Influenza: {Blank single:19197::"Up to Date","Not up to Date","unknown"}   Impaired Fasting Glucose HbA1C:  Lab Results  Component Value Date   HGBA1C 6.2 (H) 01/28/2023   Duration of elevated blood sugar:  Polydipsia: {Blank single:19197::"yes","no"} Polyuria: {Blank single:19197::"yes","no"} Weight change: {Blank single:19197::"yes","no"} Visual disturbance: {Blank single:19197::"yes","no"} Glucose Monitoring: {Blank single:19197::"yes","no"}    Accucheck frequency: {Blank single:19197::"Not Checking","Daily","BID","TID"}    Fasting glucose:     Post prandial:  Diabetic Education: {Blank single:19197::"Completed","Not Completed"} Family history of diabetes: {Blank single:19197::"yes","no"}   OSTEOPENIA & BREAST CA Obtains Prolia  injections with oncology.  Scheduled for DEXA and mammogram 07/31/23.  Follows with oncology due to breast cancer, continues on Aromasin  daily. Last visit with them was 02/18/23. Satisfied with current treatment?: {Blank single:19197::"yes","no"} Adequate calcium & vitamin D : {Blank single:19197::"yes","no"} Intolerance to bisphosphonates:{Blank single:19197::"yes","no"} Weight bearing exercises: {Blank single:19197::"yes","no"}      06/18/2023   10:08 AM 01/28/2023    9:24  AM 08/28/2022   11:04 AM 07/03/2022    9:31 AM 06/12/2022   10:57 AM  Depression screen PHQ 2/9  Decreased Interest 0 0 0 0 0  Down, Depressed, Hopeless 0 1 0 0 0  PHQ - 2 Score 0 1 0 0 0  Altered sleeping 3 2 2 2  0  Tired, decreased energy 2 1 0 1 0  Change in appetite 0 0 0 0 0  Feeling bad or failure about yourself  1 0 0 0 0  Trouble concentrating 0 0 0 0 0  Moving slowly or fidgety/restless 0 0 0 0 0  Suicidal  thoughts 0 0 0 0 0  PHQ-9 Score 6 4 2 3  0  Difficult doing work/chores Somewhat difficult Not difficult at all Not difficult at all Not difficult at all Not difficult at all       01/28/2023    9:24 AM 08/28/2022   11:04 AM 07/03/2022    9:31 AM 05/24/2022    9:12 AM  GAD 7 : Generalized Anxiety Score  Nervous, Anxious, on Edge 1 0 0 0  Control/stop worrying 1 1 0 0  Worry too much - different things 1 0 1 1  Trouble relaxing 1 0 0 0  Restless 1 0 0 0  Easily annoyed or irritable 0 0 0 0  Afraid - awful might happen 1 0 0 0  Total GAD 7 Score 6 1 1 1   Anxiety Difficulty Not difficult at all Not difficult at all Not difficult at all Not difficult at all   Relevant past medical, surgical, family and social history reviewed and updated as indicated. Interim medical history since our last visit reviewed. Allergies and medications reviewed and updated.  Review of Systems  Per HPI unless specifically indicated above     Objective:    BP (!) 150/72   Pulse 80   Temp 98.7 F (37.1 C) (Oral)   Ht 5\' 7"  (1.702 m)   Wt 235 lb 12.8 oz (107 kg)   SpO2 92%   BMI 36.93 kg/m   Wt Readings from Last 3 Encounters:  07/29/23 235 lb 12.8 oz (107 kg)  06/18/23 233 lb (105.7 kg)  02/18/23 233 lb 6.4 oz (105.9 kg)    Physical Exam  Results for orders placed or performed in visit on 02/18/23  Vitamin B12   Collection Time: 02/18/23 10:24 AM  Result Value Ref Range   Vitamin B-12 2,485 (H) 180 - 914 pg/mL  Cancer antigen 27.29   Collection Time: 02/18/23 10:24 AM   Result Value Ref Range   CA 27.29 11.8 0.0 - 38.6 U/mL  CMP (Cancer Center only)   Collection Time: 02/18/23 10:24 AM  Result Value Ref Range   Sodium 138 135 - 145 mmol/L   Potassium 3.7 3.5 - 5.1 mmol/L   Chloride 103 98 - 111 mmol/L   CO2 26 22 - 32 mmol/L   Glucose, Bld 170 (H) 70 - 99 mg/dL   BUN 15 8 - 23 mg/dL   Creatinine 1.61 0.96 - 1.00 mg/dL   Calcium 9.7 8.9 - 04.5 mg/dL   Total Protein 7.2 6.5 - 8.1 g/dL   Albumin 4.1 3.5 - 5.0 g/dL   AST 17 15 - 41 U/L   ALT 17 0 - 44 U/L   Alkaline Phosphatase 78 38 - 126 U/L   Total Bilirubin 0.5 <1.2 mg/dL   GFR, Estimated >40 >98 mL/min   Anion gap 9 5 - 15  CBC with Differential (Cancer Center Only)   Collection Time: 02/18/23 10:24 AM  Result Value Ref Range   WBC Count 7.9 4.0 - 10.5 K/uL   RBC 4.31 3.87 - 5.11 MIL/uL   Hemoglobin 13.0 12.0 - 15.0 g/dL   HCT 11.9 14.7 - 82.9 %   MCV 92.1 80.0 - 100.0 fL   MCH 30.2 26.0 - 34.0 pg   MCHC 32.7 30.0 - 36.0 g/dL   RDW 56.2 13.0 - 86.5 %   Platelet Count 231 150 - 400 K/uL   nRBC 0.0 0.0 - 0.2 %   Neutrophils Relative % 64 %  Neutro Abs 5.1 1.7 - 7.7 K/uL   Lymphocytes Relative 16 %   Lymphs Abs 1.2 0.7 - 4.0 K/uL   Monocytes Relative 10 %   Monocytes Absolute 0.8 0.1 - 1.0 K/uL   Eosinophils Relative 7 %   Eosinophils Absolute 0.6 (H) 0.0 - 0.5 K/uL   Basophils Relative 1 %   Basophils Absolute 0.1 0.0 - 0.1 K/uL   Immature Granulocytes 2 %   Abs Immature Granulocytes 0.14 (H) 0.00 - 0.07 K/uL      Assessment & Plan:   Problem List Items Addressed This Visit       Cardiovascular and Mediastinum   Essential hypertension   Relevant Medications   furosemide (LASIX) 20 MG tablet   Other Relevant Orders   Microalbumin, Urine Waived   Comprehensive metabolic panel with GFR   TSH     Respiratory   Obstructive apnea   Asthma-COPD overlap syndrome (HCC)     Digestive   GERD (gastroesophageal reflux disease)   Relevant Orders   Magnesium     Endocrine    IFG (impaired fasting glucose)   Relevant Orders   Microalbumin, Urine Waived   Bayer DCA Hb A1c Waived   Comprehensive metabolic panel with GFR     Musculoskeletal and Integument   Osteopenia of lumbar spine   Relevant Orders   VITAMIN D  25 Hydroxy (Vit-D Deficiency, Fractures)     Other   Obesity   Long term (current) use of aromatase inhibitors   Iron deficiency anemia   Hyperlipidemia   Relevant Medications   furosemide (LASIX) 20 MG tablet   Other Relevant Orders   Comprehensive metabolic panel with GFR   Lipid Panel w/o Chol/HDL Ratio   Carcinoma of overlapping sites of right breast in female, estrogen receptor positive (HCC) - Primary     Follow up plan: No follow-ups on file.

## 2023-07-29 NOTE — Assessment & Plan Note (Addendum)
 Chronic, stable. BP elevated in office today. Suspect elevation related to stressors at present.  Continue current medication regimen Amlodipine , Metoprolol  Succinate, Aldactone, and Valsartan . No edema in office today. Continue to monitor BP at home and document for provider. Increase fluid intake and focus on DASH diet. Labs: CMP. Return in 5 weeks to recheck BP.

## 2023-07-29 NOTE — Progress Notes (Signed)
 BP (!) 142/74   Pulse 71   Temp 98.7 F (37.1 C) (Oral)   Ht 5\' 7"  (1.702 m)   Wt 235 lb 12.8 oz (107 kg)   SpO2 92%   BMI 36.93 kg/m    Subjective:    Patient ID: Melissa Merritt, female    DOB: 1951-02-07, 73 y.o.   MRN: [id number]  HPI: Melissa Merritt is a 73 y.o. female.   Chief Complaint  Patient presents with   Hypertension   Hyperlipidemia   NOTE WRITTEN BY DNP STUDENT.  ASSESSMENT AND PLAN OF CARE REVIEWED WITH STUDENT, AGREE WITH ABOVE FINDINGS AND PLAN.   HYPERTENSION / HYPERLIPIDEMIA Continues Valsartan , Metoprolol , Amlodipine , Spironolactone. Echo 06/15/22 with LV function LVEF 45-50% with normal LVH which is consistent with previous echos, going back to 2013. Last saw cardiology on 07/11/23 with no changes.   She continues to refuse statin therapy, even after at length discussions about risk reduction with PCP and cardiology.  Is taking red yeast rice.   Satisfied with current treatment? yes Duration of hypertension: chronic BP monitoring frequency: daily BP medication side effects: no Duration of hyperlipidemia: chronic Cholesterol medication side effects: no Cholesterol supplements: red yeast rice Medication compliance: good compliance Aspirin: no Recent stressors: yes Recurrent headaches: no Visual changes: no Palpitations: no Dyspnea: no Chest pain: no Lower extremity edema: no Dizzy/lightheaded: no   COPD (Stage 2) Uses Trelegy. Tried Breztri but did not tolerate as well.  Completed pulmonary rehab in December 2022.  Last exacerbation treated 10/16/22, treated with Zpack and steroid.  Has OSA and uses CPAP.  Continues Protonix  for heart burn. COPD status: stable Satisfied with current treatment?: no Oxygen use: no Limitation of activity: no Pneumovax: Up to Date Influenza: Up to Date   Impaired Fasting Glucose HbA1C:  Lab Results  Component Value Date   HGBA1C 6.0 (H) 07/29/2023   Polydipsia: no Polyuria: no Weight change: no Visual  disturbance: no Glucose Monitoring: no    Accucheck frequency: TID Diabetic Education: Completed Family history of diabetes: no   OSTEOPENIA & BREAST CA Obtains Prolia  injections with oncology.  Scheduled for DEXA and mammogram 07/31/23.  Follows with oncology due to breast cancer, continues on Aromasin  daily. Last visit with them was 02/18/23.  Continues to have anxiety over bone density scan scheduled for this week. Granddaughter's wedding this weekend, creating some additional anxiety. Satisfied with current treatment?: yes Adequate calcium & vitamin D : yes Intolerance to bisphosphonates:no Weight bearing exercises: no      07/29/2023    9:46 AM 06/18/2023   10:08 AM 01/28/2023    9:24 AM 08/28/2022   11:04 AM 07/03/2022    9:31 AM  Depression screen PHQ 2/9  Decreased Interest 0 0 0 0 0  Down, Depressed, Hopeless 1 0 1 0 0  PHQ - 2 Score 1 0 1 0 0  Altered sleeping 3 3 2 2 2   Tired, decreased energy 1 2 1  0 1  Change in appetite 0 0 0 0 0  Feeling bad or failure about yourself  0 1 0 0 0  Trouble concentrating 0 0 0 0 0  Moving slowly or fidgety/restless 0 0 0 0 0  Suicidal thoughts 0 0 0 0 0  PHQ-9 Score 5 6 4 2 3   Difficult doing work/chores Somewhat difficult Somewhat difficult Not difficult at all Not difficult at all Not difficult at all       07/29/2023    9:46 AM 01/28/2023  9:24 AM 08/28/2022   11:04 AM 07/03/2022    9:31 AM  GAD 7 : Generalized Anxiety Score  Nervous, Anxious, on Edge 1 1 0 0  Control/stop worrying 1 1 1  0  Worry too much - different things 1 1 0 1  Trouble relaxing 1 1 0 0  Restless 0 1 0 0  Easily annoyed or irritable 0 0 0 0  Afraid - awful might happen 0 1 0 0  Total GAD 7 Score 4 6 1 1   Anxiety Difficulty Somewhat difficult Not difficult at all Not difficult at all Not difficult at all   Relevant past medical, surgical, family and social history reviewed and updated as indicated. Interim medical history since our last visit  reviewed. Allergies and medications reviewed and updated.  Review of Systems  Constitutional:  Negative for activity change and appetite change.  HENT: Negative.    Eyes: Negative.   Respiratory:  Negative for cough and chest tightness.   Cardiovascular:  Negative for chest pain and palpitations.  Gastrointestinal: Negative.   Endocrine: Negative for polydipsia and polyuria.  Genitourinary: Negative.   Musculoskeletal:  Negative for joint swelling.  Skin: Negative.   Allergic/Immunologic: Negative.   Neurological:  Negative for light-headedness and headaches.  Hematological: Negative.   Psychiatric/Behavioral:  Negative for sleep disturbance and suicidal ideas. The patient is nervous/anxious.     Per HPI unless specifically indicated above     Objective:    BP (!) 142/74   Pulse 71   Temp 98.7 F (37.1 C) (Oral)   Ht 5\' 7"  (1.702 m)   Wt 235 lb 12.8 oz (107 kg)   SpO2 92%   BMI 36.93 kg/m   Wt Readings from Last 3 Encounters:  07/29/23 235 lb 12.8 oz (107 kg)  06/18/23 233 lb (105.7 kg)  02/18/23 233 lb 6.4 oz (105.9 kg)    Physical Exam Vitals and nursing note reviewed.  Constitutional:      Appearance: Normal appearance. She is well-developed and well-groomed. She is obese. She is not ill-appearing.  Neck:     Thyroid : No thyroid  mass.     Vascular: No carotid bruit.  Cardiovascular:     Rate and Rhythm: Normal rate and regular rhythm.     Heart sounds: Normal heart sounds. No murmur heard. Pulmonary:     Effort: Pulmonary effort is normal. No respiratory distress.     Breath sounds: Normal breath sounds and air entry. No wheezing.  Abdominal:     General: Bowel sounds are normal.     Palpations: Abdomen is soft.  Musculoskeletal:        General: Normal range of motion.     Cervical back: Normal range of motion and neck supple.     Right lower leg: No edema.     Left lower leg: No edema.  Lymphadenopathy:     Cervical: No cervical adenopathy.     Right  cervical: No superficial cervical adenopathy.    Left cervical: No superficial cervical adenopathy.  Skin:    General: Skin is warm and dry.  Neurological:     General: No focal deficit present.     Mental Status: She is alert and oriented to person, place, and time. Mental status is at baseline.     Deep Tendon Reflexes: Reflexes are normal and symmetric.  Psychiatric:        Attention and Perception: Attention and perception normal.        Mood and Affect: Mood  and affect normal.        Speech: Speech normal.        Behavior: Behavior normal. Behavior is cooperative.        Thought Content: Thought content normal.        Cognition and Memory: Cognition and memory normal.        Judgment: Judgment normal.     Results for orders placed or performed in visit on 07/29/23  Microalbumin, Urine Waived   Collection Time: 07/29/23  9:52 AM  Result Value Ref Range   Microalb, Ur Waived 30 (H) 0 - 19 mg/L   Creatinine, Urine Waived 200 10 - 300 mg/dL   Microalb/Creat Ratio <30 <30 mg/g  Bayer DCA Hb A1c Waived   Collection Time: 07/29/23  9:52 AM  Result Value Ref Range   HB A1C (BAYER DCA - WAIVED) 6.0 (H) 4.8 - 5.6 %      Assessment & Plan:   Problem List Items Addressed This Visit       Cardiovascular and Mediastinum   Essential hypertension   Chronic, stable. BP elevated in office today. Continue current medication regimen Amlodipine , Metoprolol  Succinate, Aldactone, and Valsartan . No edema in office today. Continue to monitor BP at home and document for provider. Increase fluid intake and focus on DASH diet. Labs: CMP. Return in 5 weeks to recheck BP.      Relevant Medications   furosemide (LASIX) 20 MG tablet   Other Relevant Orders   Microalbumin, Urine Waived (Completed)   Comprehensive metabolic panel with GFR   TSH     Respiratory   Obstructive apnea   Chronic, ongoing. Not using her CPAP, doesn't like the mask. Recommend she use her CPAP 100% of time.       Asthma-COPD overlap syndrome (HCC)   Chronic. Stable with Trelegy. Will continue collaboration with pulmonary and medications to continue as prescribed by them.        Digestive   GERD (gastroesophageal reflux disease)   Chronic, stable on Protonix . Recommend to continue medication regimen. Magnesium level checked today. Patient understands risks of PPI, including bone loss, C. Diff diarrhea, infections, CKD, electrolyte abnormalities, and pneumonia.       Relevant Orders   Magnesium     Endocrine   IFG (impaired fasting glucose)   A1c 6.0% today. Recommend to continue focus on diet at home.      Relevant Orders   Microalbumin, Urine Waived (Completed)   Bayer DCA Hb A1c Waived (Completed)   Comprehensive metabolic panel with GFR     Musculoskeletal and Integument   Osteopenia of lumbar spine   Chronic. DEXA scheduled this week. Reports anxiety about up coming scan. Recommend discussing with oncology about medication for anxiety relief prior to scan. Recommend to continue Vitamin D  daily and Prolia . Will continue collaboration with oncology.   -Labs: Vitamin D25.      Relevant Orders   VITAMIN D  25 Hydroxy (Vit-D Deficiency, Fractures)     Other   Hyperlipidemia   Chronic, ongoing. Patient continues to refuse statin, does not want to take. Continues red yeast rice daily. Labs: Lipid panel.       Relevant Medications   furosemide (LASIX) 20 MG tablet   Other Relevant Orders   Comprehensive metabolic panel with GFR   Lipid Panel w/o Chol/HDL Ratio   Obesity   BMI 19.14. Recommend eating smaller more frequent high protein, low fat meals. Exercising 5 days per week for 30 min per day. Recommend  increase water intake and decrease carbonated drinks. Patient verbalized understanding of these recommendations.      Carcinoma of overlapping sites of right breast in female, estrogen receptor positive (HCC) - Primary   Ongoing. Continue collaboration with oncology. Continue  medications as prescribed by them. Dexa scheduled for this week. Has increased anxiety concerning the scan.      Iron deficiency anemia   Ongoing. Continue current medication regimen Ferrus Sulfate 220 mg daily. Monitor for constipation.      Long term (current) use of aromatase inhibitors   Ongoing. Followed by oncology. Will continue collaboration with oncology.        Follow up plan: Return in about 5 weeks (around 09/02/2023) for HTN -- recheck BP when not as stressed.

## 2023-07-29 NOTE — Assessment & Plan Note (Signed)
 Chronic. Stable with Trelegy. Will continue collaboration with pulmonary and medications to continue as prescribed by them.

## 2023-07-29 NOTE — Assessment & Plan Note (Addendum)
 Ongoing. Continue current medication regimen Ferrus Sulfate 220 mg daily. Monitor for constipation.

## 2023-07-30 ENCOUNTER — Encounter: Payer: Self-pay | Admitting: Nurse Practitioner

## 2023-07-30 LAB — TSH: TSH: 1.5 u[IU]/mL (ref 0.450–4.500)

## 2023-07-30 LAB — COMPREHENSIVE METABOLIC PANEL WITH GFR
ALT: 17 IU/L (ref 0–32)
AST: 16 IU/L (ref 0–40)
Albumin: 4.4 g/dL (ref 3.8–4.8)
Alkaline Phosphatase: 78 IU/L (ref 44–121)
BUN/Creatinine Ratio: 16 (ref 12–28)
BUN: 20 mg/dL (ref 8–27)
Bilirubin Total: 0.3 mg/dL (ref 0.0–1.2)
CO2: 27 mmol/L (ref 20–29)
Calcium: 10.8 mg/dL — ABNORMAL HIGH (ref 8.7–10.3)
Chloride: 101 mmol/L (ref 96–106)
Creatinine, Ser: 1.27 mg/dL — ABNORMAL HIGH (ref 0.57–1.00)
Globulin, Total: 2.2 g/dL (ref 1.5–4.5)
Glucose: 137 mg/dL — ABNORMAL HIGH (ref 70–99)
Potassium: 4.4 mmol/L (ref 3.5–5.2)
Sodium: 142 mmol/L (ref 134–144)
Total Protein: 6.6 g/dL (ref 6.0–8.5)
eGFR: 44 mL/min/{1.73_m2} — ABNORMAL LOW (ref >59)

## 2023-07-30 LAB — LIPID PANEL W/O CHOL/HDL RATIO
Cholesterol, Total: 180 mg/dL (ref 100–199)
HDL: 51 mg/dL (ref >39)
LDL Chol Calc (NIH): 109 mg/dL — ABNORMAL HIGH (ref 0–99)
Triglycerides: 110 mg/dL (ref 0–149)
VLDL Cholesterol Cal: 20 mg/dL (ref 5–40)

## 2023-07-30 LAB — MAGNESIUM: Magnesium: 2.1 mg/dL (ref 1.6–2.3)

## 2023-07-30 LAB — VITAMIN D 25 HYDROXY (VIT D DEFICIENCY, FRACTURES): Vit D, 25-Hydroxy: 45.2 ng/mL (ref 30.0–100.0)

## 2023-07-30 NOTE — Progress Notes (Signed)
 Contacted via MyChart   Good evening Melissa Merritt, your labs have returned: - Kidney function, creatinine and eGFR, shows mild low levels with chronic kidney disease stage 3a.  Ensure good water intake daily and avoid Ibuprofen products.  Calcium a little high, try to cut back on any TUMS or calcium supplements. - LDL, bad cholesterol, remains elevated.  I do continue to recommend statin therapy to help prevent stroke or heart attack.  If you wish to try this let me know. - Remainder of labs stable.  Any questions? Keep being stellar!!  Thank you for allowing me to participate in your care.  I appreciate you. Kindest regards, Mildred Tuccillo

## 2023-07-31 ENCOUNTER — Ambulatory Visit
Admission: RE | Admit: 2023-07-31 | Discharge: 2023-07-31 | Disposition: A | Discharge status: Home / Self Care | Payer: Medicare Other | Source: Ambulatory Visit | Attending: Internal Medicine | Admitting: Internal Medicine

## 2023-07-31 DIAGNOSIS — C50811 Malignant neoplasm of overlapping sites of right female breast: Secondary | ICD-10-CM | POA: Diagnosis not present

## 2023-07-31 DIAGNOSIS — M8589 Other specified disorders of bone density and structure, multiple sites: Secondary | ICD-10-CM | POA: Diagnosis not present

## 2023-07-31 DIAGNOSIS — Z9889 Other specified postprocedural states: Secondary | ICD-10-CM | POA: Insufficient documentation

## 2023-07-31 DIAGNOSIS — Z170 Estrogen receptor positive status [ER+]: Secondary | ICD-10-CM | POA: Diagnosis not present

## 2023-07-31 DIAGNOSIS — M8588 Other specified disorders of bone density and structure, other site: Secondary | ICD-10-CM

## 2023-07-31 DIAGNOSIS — Z1231 Encounter for screening mammogram for malignant neoplasm of breast: Secondary | ICD-10-CM | POA: Insufficient documentation

## 2023-07-31 DIAGNOSIS — Z780 Asymptomatic menopausal state: Secondary | ICD-10-CM | POA: Diagnosis not present

## 2023-08-01 ENCOUNTER — Encounter: Payer: Self-pay | Admitting: Internal Medicine

## 2023-08-16 ENCOUNTER — Encounter: Payer: Self-pay | Admitting: Internal Medicine

## 2023-08-19 ENCOUNTER — Inpatient Hospital Stay: Payer: Medicare Other

## 2023-08-19 ENCOUNTER — Encounter: Payer: Self-pay | Admitting: Internal Medicine

## 2023-08-19 ENCOUNTER — Inpatient Hospital Stay (HOSPITAL_BASED_OUTPATIENT_CLINIC_OR_DEPARTMENT_OTHER): Payer: Medicare Other | Admitting: Internal Medicine

## 2023-08-19 ENCOUNTER — Inpatient Hospital Stay: Payer: Medicare Other | Attending: Internal Medicine

## 2023-08-19 VITALS — BP 146/78 | HR 67 | Temp 98.7°F | Resp 12 | Ht 67.0 in | Wt 233.3 lb

## 2023-08-19 DIAGNOSIS — I959 Hypotension, unspecified: Secondary | ICD-10-CM | POA: Diagnosis not present

## 2023-08-19 DIAGNOSIS — C50811 Malignant neoplasm of overlapping sites of right female breast: Secondary | ICD-10-CM

## 2023-08-19 DIAGNOSIS — M8588 Other specified disorders of bone density and structure, other site: Secondary | ICD-10-CM

## 2023-08-19 DIAGNOSIS — Z87891 Personal history of nicotine dependence: Secondary | ICD-10-CM | POA: Insufficient documentation

## 2023-08-19 DIAGNOSIS — Z923 Personal history of irradiation: Secondary | ICD-10-CM | POA: Diagnosis not present

## 2023-08-19 DIAGNOSIS — R7303 Prediabetes: Secondary | ICD-10-CM | POA: Diagnosis not present

## 2023-08-19 DIAGNOSIS — I110 Hypertensive heart disease with heart failure: Secondary | ICD-10-CM | POA: Diagnosis not present

## 2023-08-19 DIAGNOSIS — E538 Deficiency of other specified B group vitamins: Secondary | ICD-10-CM | POA: Insufficient documentation

## 2023-08-19 DIAGNOSIS — Z801 Family history of malignant neoplasm of trachea, bronchus and lung: Secondary | ICD-10-CM | POA: Insufficient documentation

## 2023-08-19 DIAGNOSIS — R232 Flushing: Secondary | ICD-10-CM | POA: Diagnosis not present

## 2023-08-19 DIAGNOSIS — M8580 Other specified disorders of bone density and structure, unspecified site: Secondary | ICD-10-CM | POA: Insufficient documentation

## 2023-08-19 DIAGNOSIS — R0602 Shortness of breath: Secondary | ICD-10-CM | POA: Diagnosis not present

## 2023-08-19 DIAGNOSIS — Z170 Estrogen receptor positive status [ER+]: Secondary | ICD-10-CM | POA: Diagnosis not present

## 2023-08-19 DIAGNOSIS — Z79811 Long term (current) use of aromatase inhibitors: Secondary | ICD-10-CM | POA: Insufficient documentation

## 2023-08-19 DIAGNOSIS — Z803 Family history of malignant neoplasm of breast: Secondary | ICD-10-CM | POA: Diagnosis not present

## 2023-08-19 DIAGNOSIS — C50911 Malignant neoplasm of unspecified site of right female breast: Secondary | ICD-10-CM | POA: Diagnosis not present

## 2023-08-19 DIAGNOSIS — D509 Iron deficiency anemia, unspecified: Secondary | ICD-10-CM | POA: Diagnosis not present

## 2023-08-19 DIAGNOSIS — R5383 Other fatigue: Secondary | ICD-10-CM | POA: Diagnosis not present

## 2023-08-19 LAB — CBC WITH DIFFERENTIAL (CANCER CENTER ONLY)
Abs Immature Granulocytes: 0.16 10*3/uL — ABNORMAL HIGH (ref 0.00–0.07)
Basophils Absolute: 0.1 10*3/uL (ref 0.0–0.1)
Basophils Relative: 1 %
Eosinophils Absolute: 0.4 10*3/uL (ref 0.0–0.5)
Eosinophils Relative: 5 %
HCT: 38.3 % (ref 36.0–46.0)
Hemoglobin: 13 g/dL (ref 12.0–15.0)
Immature Granulocytes: 2 %
Lymphocytes Relative: 14 %
Lymphs Abs: 1.1 10*3/uL (ref 0.7–4.0)
MCH: 30.6 pg (ref 26.0–34.0)
MCHC: 33.9 g/dL (ref 30.0–36.0)
MCV: 90.1 fL (ref 80.0–100.0)
Monocytes Absolute: 0.8 10*3/uL (ref 0.1–1.0)
Monocytes Relative: 9 %
Neutro Abs: 5.8 10*3/uL (ref 1.7–7.7)
Neutrophils Relative %: 69 %
Platelet Count: 209 10*3/uL (ref 150–400)
RBC: 4.25 MIL/uL (ref 3.87–5.11)
RDW: 13 % (ref 11.5–15.5)
WBC Count: 8.3 10*3/uL (ref 4.0–10.5)
nRBC: 0 % (ref 0.0–0.2)

## 2023-08-19 LAB — CMP (CANCER CENTER ONLY)
ALT: 25 U/L (ref 0–44)
AST: 20 U/L (ref 15–41)
Albumin: 4 g/dL (ref 3.5–5.0)
Alkaline Phosphatase: 77 U/L (ref 38–126)
Anion gap: 9 (ref 5–15)
BUN: 24 mg/dL — ABNORMAL HIGH (ref 8–23)
CO2: 27 mmol/L (ref 22–32)
Calcium: 10 mg/dL (ref 8.9–10.3)
Chloride: 100 mmol/L (ref 98–111)
Creatinine: 1.12 mg/dL — ABNORMAL HIGH (ref 0.44–1.00)
GFR, Estimated: 52 mL/min — ABNORMAL LOW (ref >60)
Glucose, Bld: 139 mg/dL — ABNORMAL HIGH (ref 70–99)
Potassium: 3.9 mmol/L (ref 3.5–5.1)
Sodium: 136 mmol/L (ref 135–145)
Total Bilirubin: 0.6 mg/dL (ref 0.0–1.2)
Total Protein: 7.4 g/dL (ref 6.5–8.1)

## 2023-08-19 LAB — VITAMIN B12: Vitamin B-12: 824 pg/mL (ref 180–914)

## 2023-08-19 MED ORDER — DENOSUMAB 60 MG/ML ~~LOC~~ SOSY
60.0000 mg | PREFILLED_SYRINGE | Freq: Once | SUBCUTANEOUS | Status: AC
  Administered 2023-08-19: 60 mg via SUBCUTANEOUS
  Filled 2023-08-19: qty 1

## 2023-08-19 NOTE — Progress Notes (Signed)
 C/o SOB, no worse.  How much time should we wait b/w the prolia  and the shingles injection?  Bone density 3 weeks ago at [facility].  C/o BP dropping at night, sees Dr. Cleda Curly at [facility].

## 2023-08-19 NOTE — Assessment & Plan Note (Signed)
#  2021-Stage Ia right breast cancer status postlumpectomy;  ER/PR positive, HER2/neu negative. s/p adjuvant radiation therapy on 11/17/2019.  Currently on Aromasin  [Fall of 2026]. APRIL 2025 -abnormal mammogram recent breast biopsy-negative for any malignancy.  Stable.   # Patient currently on Aromasin -tolerating well.  No worsening joint pains.  No worsening hot flashes.    # Hot flashes- G-1-2-second Aromasin . Stable.      # Osteopenia-Bone density in April 2025- osteopenia-OFF calcium 1200 mg [PCP- April 2025] continue vitamin D  1000 IU daily.  Continue prolia  today- ? Duration.    #  Iron deficiency anemia-history of iron deficiency anemia.  On oral iron.    Continue oral iron in the interim- stable.   # Intermittent hypotension- ? Etiology- on anti-HTN/ lasix- defer to cardiology- [Dr.Parashoes]; improved now-   # Pre-diabetic- BG-1 pepsi [BG- 170]; slightly elevated creatinine 1.1 GFR 49. Also discussed regarding cutting down on sodas and also healthy weight loss.   # B12 deficiency.  History of B12 deficiency; OCT 2024- > 2000-  recommend on oral B12 3 times a week- stable.               # DISPOSITION: # Prolia  today-  # follow up- in 6 months- MD; cbc/cmp;iron studies;ferritin;  ca27-29; b12; prolia - Dr.B

## 2023-08-19 NOTE — Progress Notes (Signed)
 [facility] CONSULT NOTE  Patient Care Team: Lemar Pyles, NP as PCP - General (Nurse Practitioner) Reche Canales, OD (Optometry) Percival Brace, MD as Consulting Physician (Cardiology) Barton Like, PA-C Mariane Shire, MD (Dermatology) Arlette Benders, RN (Inactive) as Oncology Nurse Navigator Conrado Delay, DO as Consulting Physician (Surgery) Glenis Langdon, MD as Radiation Oncologist (Radiation Oncology) Gwyn Leos, MD as Consulting Physician (Hematology and Oncology) Erskin Hearing, MD as Consulting Physician (Pulmonary Disease)  CHIEF COMPLAINTS/PURPOSE OF CONSULTATION: right breast cancer   #  Oncology History Overview Note  Bilateral diagnostic mammogram on 07/08/2019 revealed persistent architectural distortion involving the upper outer quadrant of the right breast without sonographic correlate.  There was no pathologic right axillary lymphadenopathy.  No mammographic or sonographic evidence of malignancy in the left breast.  CA27.29 was 19.0 (07/24/19) at diagnosis.   Patient underwent stereotactic biopsy on 07/17/2019.  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  Intermediate nuclear grade with central necrosis.  Consistent with stage Ia right breast cancer.  She underwent partial mastectomy and right axillary sentinel lymph node biopsy on 08/13/2019.  Pathology revealed grade 1 invasive mammary carcinoma, no special type, and intermediate grade DCIS.  There is atypical lobular hyperplasia with ductal involvement.  Additional posterior margin revealed no evidence of residual carcinoma or DCIS.  Closest margin was > 10 mm and 2 mm for DCIS.  1 lymph node was negative for malignancy.  Tumor was ER/PR positive and HER-2/neu negative.  Pathologic stage was pT1a pN0 (sn).   She received 5040 cGy to her right breast and 1400 cGy boost to her scar from 09/28/2019-11/17/2019.  She began tamoxifen  on 11/27/2019.  Tamoxifen   discontinued in December 2021 due to intolerable side effects.  She was switched to Femara , started 04/12/20.  stopped in 9th May 2022 sec to leg swelling.  # AUG 2022- STARTED AROMASIN   Baseline bone density scan in April 2021 consistent with osteopenia with T score of -1.5. Plan for prolia .    Carcinoma of overlapping sites of right breast in female, estrogen receptor positive (HCC)  07/24/2019 Initial Diagnosis   Malignant neoplasm of right breast in female, estrogen receptor positive (HCC)   Carcinoma of overlapping sites of right breast in female, estrogen receptor positive (HCC) (Resolved)  02/14/2021 Initial Diagnosis   Carcinoma of overlapping sites of right breast in female, estrogen receptor positive (HCC)     HISTORY OF PRESENTING ILLNESS: Patient ambulating-independently.   Alone/.  Melissa Merritt 73 y.o.  female with stage I ER/PR positive HER2 negative breast cancer on aromasin  is here for follow up- Bone density/mammogram.   Patient has chronic SOB, no worse. C/o BP dropping at night, sees Dr. Cleda Curly at [facility]. No chest pain.   Patient denies any worsening hot flashes or swelling in the legs.  Appetite is good.  No weight loss.  Review of Systems  Constitutional:  Positive for malaise/fatigue. Negative for chills, diaphoresis and fever.  HENT:  Negative for nosebleeds and sore throat.   Eyes:  Negative for double vision.  Respiratory:  Negative for cough, hemoptysis, sputum production, shortness of breath and wheezing.   Cardiovascular:  Negative for chest pain, palpitations, orthopnea and leg swelling.  Gastrointestinal:  Negative for abdominal pain, blood in stool, constipation, diarrhea, heartburn, melena, nausea and vomiting.  Genitourinary:  Negative for dysuria, frequency and urgency.  Musculoskeletal:  Positive for back pain and joint pain.  Skin: Negative.  Negative for itching and rash.  Neurological:  Negative for dizziness, tingling, focal weakness, weakness and  headaches.  Endo/Heme/Allergies:  Does not bruise/bleed easily.  Psychiatric/Behavioral:  Negative for depression. The patient is not nervous/anxious and does not have insomnia.      MEDICAL HISTORY:  Past Medical History:  Diagnosis Date   Benign tumor of lung    Breast cancer (HCC)    Cancer (HCC)    right breast   CHF (congestive heart failure) (HCC)    Dyspnea    GERD (gastroesophageal reflux disease)    Hyperlipidemia    Hypertension    Insomnia    Osteopenia    Personal history of radiation therapy    Plantar fasciitis    PONV (postoperative nausea and vomiting)    vomiting 2 days after lung surgery   S/P pneumonectomy    right lower lobe   Urinary incontinence     SURGICAL HISTORY: Past Surgical History:  Procedure Laterality Date   BREAST BIOPSY Right 07/17/2019   Affirm bx-"Ribbon" clip-[facility] & DCIS   BREAST BIOPSY Right 07/31/2021   Atereo bx, x-clip, path pending   BREAST LUMPECTOMY Right 2021   [facility], DCIS, 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   bronchoscopy     CATARACT EXTRACTION, BILATERAL     Left- 11/21/15, right- 12/23/15   CHOLECYSTECTOMY     COLONOSCOPY WITH PROPOFOL  N/A 06/20/2022   Procedure: COLONOSCOPY WITH PROPOFOL ;  Surgeon: Conrado Delay, DO;  Location: [facility] ENDOSCOPY;  Service: General;  Laterality: N/A;   CYSTECTOMY  11/03/2009   cheek   dilation and curretage     EYE SURGERY Bilateral    cataract   FOOT SURGERY Bilateral 2010   LOBECTOMY     LUNG SURGERY Right    PART MASTECTOMY,RADIO FREQUENCY LOCALIZER,AXILLARY SENTINEL NODE BIOPSY Right 08/13/2019   Procedure: PART MASTECTOMY,RADIO FREQUENCY LOCALIZER,AXILLARY SENTINEL NODE BIOPSY;  Surgeon: Conrado Delay, DO;  Location: [facility] ORS;  Service: General;  Laterality: Right;   UPPER GI ENDOSCOPY  2005    SOCIAL HISTORY: Social History   Socioeconomic History   Marital status: Married    Spouse name: Stephens Eis   Number of children: 3   Years of education: Not on file   Highest education level: Some  college, no degree  Occupational History   Occupation: retired   Tobacco Use   Smoking status: Former    Current packs/day: 0.00    Average packs/day: 2.0 packs/day for 10.2 years (20.5 ttl pk-yrs)    Types: Cigarettes    Start date: 24    Quit date: 07/09/1979    Years since quitting: 44.1   Smokeless tobacco: Never  Vaping Use   Vaping status: Never Used  Substance and Sexual Activity   Alcohol use: No   Drug use: No   Sexual activity: Yes  Other Topics Concern   Not on file  Social History Narrative   Son lives next door - another son and daughter live out of town   Social Drivers of Corporate investment banker Strain: Low Risk  (06/15/2023)   Overall Financial Resource Strain (CARDIA)    Difficulty of Paying Living Expenses: Not hard at all  Food Insecurity: No Food Insecurity (06/15/2023)   Hunger Vital Sign    Worried About Running Out of Food in the Last Year: Never true    Ran Out of Food in the Last Year: Never true  Transportation Needs: No Transportation Needs (06/15/2023)   PRAPARE - Administrator, Civil Service (Medical): No  Lack of Transportation (Non-Medical): No  Physical Activity: Inactive (06/15/2023)   Exercise Vital Sign    Days of Exercise per Week: 0 days    Minutes of Exercise per Session: 0 min  Stress: Stress Concern Present (06/15/2023)   [employer] of Occupational Health - Occupational Stress Questionnaire    Feeling of Stress : Rather much  Social Connections: Moderately Integrated (06/15/2023)   Social Connection and Isolation Panel [NHANES]    Frequency of Communication with Friends and Family: More than three times a week    Frequency of Social Gatherings with Friends and Family: More than three times a week    Attends Religious Services: More than 4 times per year    Active Member of [employer] or Organizations: No    Attends Banker Meetings: Never    Marital Status: Married  Catering manager Violence: Not At Risk  (06/18/2023)   Humiliation, Afraid, Rape, and Kick questionnaire    Fear of Current or Ex-Partner: No    Emotionally Abused: No    Physically Abused: No    Sexually Abused: No    FAMILY HISTORY: Family History  Problem Relation Age of Onset   COPD Mother    Hypertension Mother    Emphysema Mother    Osteoporosis Mother    Heart disease Father    Hyperlipidemia Sister    Hyperlipidemia Brother    Heart disease Maternal Grandmother    Heart disease Maternal Grandfather    Heart disease Paternal Grandmother    Stroke Paternal Grandmother    Heart disease Paternal Grandfather    Stroke Paternal Grandfather    Cancer Maternal Aunt        breast and lung   Osteoporosis Maternal Aunt    Aortic aneurysm Maternal Aunt    Breast cancer Maternal Aunt 21    ALLERGIES:  is allergic to contrast media [iodinated contrast media], other, ace inhibitors, hctz [hydrochlorothiazide], and keflex [cephalexin].  MEDICATIONS:  Current Outpatient Medications  Medication Sig Dispense Refill   albuterol  (VENTOLIN  HFA) 108 (90 Base) MCG/ACT inhaler Inhale 2 puffs into the lungs every 6 (six) hours as needed. 18 g 3   amLODipine  (NORVASC ) 5 MG tablet Take 1 tablet (5 mg total) by mouth daily. 90 tablet 4   azelastine  (ASTELIN ) 0.1 % nasal spray Place 2 sprays into both nostrils 2 (two) times daily. Use in each nostril as directed     cholecalciferol (VITAMIN D3) 25 MCG (1000 UNIT) tablet Take 1,000 Units by mouth in the morning and at bedtime.     Cyanocobalamin  (B-12) 2500 MCG TABS Take 2,500 mcg by mouth daily.     denosumab  (PROLIA ) 60 MG/ML SOSY injection Inject 60 mg into the skin every 6 (six) months.     exemestane  (AROMASIN ) 25 MG tablet Take 1 tablet (25 mg total) by mouth daily after breakfast. 90 tablet 0   ferrous sulfate 220 (44 Fe) MG/5ML solution Take 220 mg by mouth daily.     furosemide (LASIX) 20 MG tablet Take 20 mg by mouth daily.     metoprolol  succinate (TOPROL -XL) 25 MG 24 hr  tablet Take 1 tablet (25 mg total) by mouth daily. 90 tablet 0   pantoprazole  (PROTONIX ) 40 MG tablet Take 1 tablet (40 mg total) by mouth 2 (two) times daily. 180 tablet 4   Red Yeast Rice Extract (RED YEAST RICE PO) Take 1 tablet by mouth in the morning and at bedtime.     spironolactone (ALDACTONE) 25 MG  tablet Take 25 mg by mouth daily.     St Johns Wort 300 MG CAPS Take 300 mg by mouth 2 (two) times daily.     TRELEGY ELLIPTA 100-62.5-25 MCG/INH AEPB Inhale 1 puff into the lungs daily.     valsartan  (DIOVAN ) 320 MG tablet Take 1 tablet (320 mg total) by mouth daily. 90 tablet 4   Biotin 5000 MCG TABS Take 5,000 mcg by mouth 2 (two) times daily. (Patient not taking: Reported on 08/19/2023)     No current facility-administered medications for this visit.      Aaron Aas  PHYSICAL EXAMINATION: ECOG PERFORMANCE STATUS: 0 - Asymptomatic  Vitals:   08/19/23 0934  BP: (!) 146/78  Pulse: 67  Resp: 12  Temp: 98.7 F (37.1 C)  SpO2: 96%     Filed Weights   08/19/23 0934  Weight: 233 lb 4.8 oz (105.8 kg)      Physical Exam Vitals and nursing note reviewed.  Constitutional:      Comments: Alone.  Ambulating independently.      HENT:     Head: Normocephalic and atraumatic.     Mouth/Throat:     Pharynx: Oropharynx is clear.  Eyes:     Extraocular Movements: Extraocular movements intact.     Pupils: Pupils are equal, round, and reactive to light.  Cardiovascular:     Rate and Rhythm: Normal rate and regular rhythm.  Pulmonary:     Comments: Decreased breath sounds bilaterally.  Abdominal:     Palpations: Abdomen is soft.  Musculoskeletal:        General: Normal range of motion.     Cervical back: Normal range of motion.  Skin:    General: Skin is warm.  Neurological:     General: No focal deficit present.     Mental Status: She is alert and oriented to person, place, and time.  Psychiatric:        Behavior: Behavior normal.        Judgment: Judgment normal.       LABORATORY DATA:  I have reviewed the data as listed Lab Results  Component Value Date   WBC 8.3 08/19/2023   HGB 13.0 08/19/2023   HCT 38.3 08/19/2023   MCV 90.1 08/19/2023   PLT 209 08/19/2023   Recent Labs    02/18/23 1024 07/29/23 0952 08/19/23 0942  NA 138 142 136  K 3.7 4.4 3.9  CL 103 101 100  CO2 26 27 27   GLUCOSE 170* 137* 139*  BUN 15 20 24*  CREATININE 0.97 1.27* 1.12*  CALCIUM 9.7 10.8* 10.0  GFRNONAA >60  --  52*  PROT 7.2 6.6 7.4  ALBUMIN 4.1 4.4 4.0  AST 17 16 20   ALT 17 17 25   ALKPHOS 78 78 77  BILITOT 0.5 0.3 0.6    RADIOGRAPHIC STUDIES: I have personally reviewed the radiological images as listed and agreed with the findings in the report. MM 3D SCREENING MAMMOGRAM BILATERAL BREAST Result Date: 08/02/2023 CLINICAL DATA:  Screening. EXAM: DIGITAL SCREENING BILATERAL MAMMOGRAM WITH TOMOSYNTHESIS AND CAD TECHNIQUE: Bilateral screening digital craniocaudal and mediolateral oblique mammograms were obtained. Bilateral screening digital breast tomosynthesis was performed. The images were evaluated with computer-aided detection. COMPARISON:  Previous exam(s). ACR Breast Density Category b: There are scattered areas of fibroglandular density. FINDINGS: There are no findings suspicious for malignancy. IMPRESSION: No mammographic evidence of malignancy. A result letter of this screening mammogram will be mailed directly to the patient. RECOMMENDATION: Screening mammogram in one  year. (Code:SM-B-01Y) BI-RADS CATEGORY  1: Negative. Electronically Signed   By: Alger Infield M.D.   On: 08/02/2023 11:40   DG Bone Density Result Date: 07/31/2023 EXAM: DUAL X-RAY ABSORPTIOMETRY (DXA) FOR BONE MINERAL DENSITY 07/31/2023 10:42 am CLINICAL DATA:  73 year old Female Postmenopausal. Screening for osteoporosis History of fragility fracture. Patient is or has been on bone building therapies. TECHNIQUE: An axial (e.g., hips, spine) and/or appendicular (e.g., radius) exam was  performed, as appropriate, using GE Secretary/administrator at [facility]. Images are obtained for bone mineral density measurement and are not obtained for diagnostic purposes. [id number] Exclusions: L1 due to degenerative changes. COMPARISON:  08/04/2019. FINDINGS: Scan quality: Good. LUMBAR SPINE (L2-L4): BMD (in g/cm2): 1.041 T-score: -1.4 Z-score: 0.3 Rate of change from previous exam: No significant rate of change from previous exam. LEFT FEMORAL NECK: BMD (in g/cm2): 0.842 T-score: -1.4 Z-score: 0.5 Rate of change from previous exam: No significant rate of change from previous exam. LEFT TOTAL HIP: BMD (in g/cm2): 0.973 T-score: -0.3 Z-score: 1.4 RIGHT FEMORAL NECK: BMD (in g/cm2): 0.862 T-score: -1.3 Z-score: 0.6 RIGHT TOTAL HIP: BMD (in g/cm2): 0.909 T-score: -0.8 Z-score: 0.9 DUAL-FEMUR TOTAL MEAN: Rate of change from previous exam: 3.3 % LEFT FOREARM (RADIUS 33%): BMD (in g/cm2): 0.834 T-score: -0.5 Z-score: 1.7 FRAX 10-YEAR PROBABILITY OF FRACTURE: FRAX not reported as the patient is receiving bone building therapy. IMPRESSION: Osteopenia based on BMD. Fracture risk is unknown due to history of bone building therapy. RECOMMENDATIONS: 1. All patients should optimize calcium and vitamin D  intake. 2. Consider FDA-approved medical therapies in postmenopausal women and men aged 59 years and older, based on the following: - A hip or vertebral (clinical or morphometric) fracture - T-score less than or equal to -2.5 and secondary causes have been excluded. - Low bone mass (T-score between -1.0 and -2.5) and a 10-year probability of a hip fracture greater than or equal to 3% or a 10-year probability of a major osteoporosis-related fracture greater than or equal to 20% based on the [place] -adapted WHO algorithm. - Clinician judgment and/or patient preferences may indicate treatment for people with 10-year fracture probabilities above or below these levels 3. Patients with diagnosis of osteoporosis  or at high risk for fracture should have regular bone mineral density tests. For patients eligible for Medicare, routine testing is allowed once every 2 years. The testing frequency can be increased to one year for patients who have rapidly progressing disease, those who are receiving or discontinuing medical therapy to restore bone mass, or have additional risk factors. Electronically Signed   By: Sundra Engel M.D.   On: 07/31/2023 12:23    ASSESSMENT & PLAN:   Carcinoma of overlapping sites of right breast in female, estrogen receptor positive (HCC)  #2021-Stage Ia right breast cancer status postlumpectomy;  ER/PR positive, HER2/neu negative. s/p adjuvant radiation therapy on 11/17/2019.  Currently on Aromasin  [Fall of 2026]. APRIL 2025 -abnormal mammogram recent breast biopsy-negative for any malignancy.  Stable.   # Patient currently on Aromasin -tolerating well.  No worsening joint pains.  No worsening hot flashes.    # Hot flashes- G-1-2-second Aromasin . Stable.      # Osteopenia-Bone density in April 2025- osteopenia-OFF calcium 1200 mg [PCP- April 2025] continue vitamin D  1000 IU daily.  Continue prolia  today- ? Duration.    #  Iron deficiency anemia-history of iron deficiency anemia.  On oral iron.    Continue oral iron in the interim- stable.   #  Intermittent hypotension- ? Etiology- on anti-HTN/ lasix- defer to cardiology- [Dr.Parashoes]; improved now-   # Pre-diabetic- BG-1 pepsi [BG- 170]; slightly elevated creatinine 1.1 GFR 49. Also discussed regarding cutting down on sodas and also healthy weight loss.   # B12 deficiency.  History of B12 deficiency; OCT 2024- > 2000-  recommend on oral B12 3 times a week- stable.               # DISPOSITION: # Prolia  today-  # follow up- in 6 months- MD; cbc/cmp;iron studies;ferritin;  ca27-29; b12; prolia - Dr.B     All questions were answered. The patient knows to call the clinic with any problems, questions or concerns.    Gwyn Leos, MD 08/19/2023 10:44 AM

## 2023-08-20 LAB — CANCER ANTIGEN 27.29: CA 27.29: 9.7 U/mL (ref 0.0–38.6)

## 2023-09-01 NOTE — Patient Instructions (Signed)
 Be Involved in Caring For Your Health:  Taking Medications When medications are taken as directed, they can greatly improve your health. But if they are not taken as prescribed, they may not work. In some cases, not taking them correctly can be harmful. To help ensure your treatment remains effective and safe, understand your medications and how to take them. Bring your medications to each visit for review by your provider.  Your lab results, notes, and after visit summary will be available on [name]. We strongly encourage you to use this feature. If lab results are abnormal the clinic will contact you with the appropriate steps. If the clinic does not contact you assume the results are satisfactory. You can always view your results on [name]. If you have questions regarding your health or results, please contact the clinic during office hours. You can also ask questions on [name].  We at [facility] are grateful that you chose [name] to provide your care. We strive to provide evidence-based and compassionate care and are always looking for feedback. If you get a survey from the clinic please complete this so we can hear your opinions.  DASH Eating Plan DASH stands for Dietary Approaches to Stop Hypertension. The DASH eating plan is a healthy eating plan that has been shown to: Lower high blood pressure (hypertension). Reduce your risk for type 2 diabetes, heart disease, and stroke. Help with weight loss. What are tips for following this plan? Reading food labels Check food labels for the amount of salt (sodium) per serving. Choose foods with less than 5 percent of the Daily Value (DV) of sodium. In general, foods with less than 300 milligrams (mg) of sodium per serving fit into this eating plan. To find whole grains, look for the word "whole" as the first word in the ingredient list. Shopping Buy products labeled as "low-sodium" or "no salt added." Buy fresh foods. Avoid canned  foods and pre-made or frozen meals. Cooking Try not to add salt when you cook. Use salt-free seasonings or herbs instead of table salt or sea salt. Check with your health care provider or pharmacist before using salt substitutes. Do not fry foods. Cook foods in healthy ways, such as baking, boiling, grilling, roasting, or broiling. Cook using oils that are good for your heart. These include olive, canola, avocado, soybean, and sunflower oil. Meal planning  Eat a balanced diet. This should include: 4 or more servings of fruits and 4 or more servings of vegetables each day. Try to fill half of your plate with fruits and vegetables. 6-8 servings of whole grains each day. 6 or less servings of lean meat, poultry, or fish each day. 1 oz is 1 serving. A 3 oz (85 g) serving of meat is about the same size as the palm of your hand. One egg is 1 oz (28 g). 2-3 servings of low-fat dairy each day. One serving is 1 cup (237 mL). 1 serving of nuts, seeds, or beans 5 times each week. 2-3 servings of heart-healthy fats. Healthy fats called omega-3 fatty acids are found in foods such as walnuts, flaxseeds, fortified milks, and eggs. These fats are also found in cold-water fish, such as sardines, salmon, and mackerel. Limit how much you eat of: Canned or prepackaged foods. Food that is high in trans fat, such as fried foods. Food that is high in saturated fat, such as fatty meat. Desserts and other sweets, sugary drinks, and other foods with added sugar. Full-fat  dairy products. Do not salt foods before eating. Do not eat more than 4 egg yolks a week. Try to eat at least 2 vegetarian meals a week. Eat more home-cooked food and less restaurant, buffet, and fast food. Lifestyle When eating at a restaurant, ask if your food can be made with less salt or no salt. If you drink alcohol: Limit how much you have to: 0-1 drink a day if you are female. 0-2 drinks a day if you are female. Know how much alcohol is in  your drink. In the [place] one drink is one 12 oz bottle of beer (355 mL), one 5 oz glass of wine (148 mL), or one 1 oz glass of hard liquor (44 mL). General information Avoid eating more than 2,300 mg of salt a day. If you have hypertension, you may need to reduce your sodium intake to 1,500 mg a day. Work with your provider to stay at a healthy body weight or lose weight. Ask what the best weight range is for you. On most days of the week, get at least 30 minutes of exercise that causes your heart to beat faster. This may include walking, swimming, or biking. Work with your provider or dietitian to adjust your eating plan to meet your specific calorie needs. What foods should I eat? Fruits All fresh, dried, or frozen fruit. Canned fruits that are in their natural juice and do not have sugar added to them. Vegetables Fresh or frozen vegetables that are raw, steamed, roasted, or grilled. Low-sodium or reduced-sodium tomato and vegetable juice. Low-sodium or reduced-sodium tomato sauce and tomato paste. Low-sodium or reduced-sodium canned vegetables. Grains Whole-grain or whole-wheat bread. Whole-grain or whole-wheat pasta. Brown rice. Orpah Cobb. Bulgur. Whole-grain and low-sodium cereals. Pita bread. Low-fat, low-sodium crackers. Whole-wheat flour tortillas. Meats and other proteins Skinless chicken or [place]. Ground chicken or [place]. Pork with fat trimmed off. Fish and seafood. Egg whites. Dried beans, peas, or lentils. Unsalted nuts, nut butters, and seeds. Unsalted canned beans. Lean cuts of beef with fat trimmed off. Low-sodium, lean precooked or cured meat, such as sausages or meat loaves. Dairy Low-fat (1%) or fat-free (skim) milk. Reduced-fat, low-fat, or fat-free cheeses. Nonfat, low-sodium ricotta or cottage cheese. Low-fat or nonfat yogurt. Low-fat, low-sodium cheese. Fats and oils Soft margarine without trans fats. Vegetable oil. Reduced-fat, low-fat, or light mayonnaise and salad  dressings (reduced-sodium). Canola, safflower, olive, avocado, soybean, and sunflower oils. Avocado. Seasonings and condiments Herbs. Spices. Seasoning mixes without salt. Other foods Unsalted popcorn and pretzels. Fat-free sweets. The items listed above may not be all the foods and drinks you can have. Talk to a dietitian to learn more. What foods should I avoid? Fruits Canned fruit in a light or heavy syrup. Fried fruit. Fruit in cream or butter sauce. Vegetables Creamed or fried vegetables. Vegetables in a cheese sauce. Regular canned vegetables that are not marked as low-sodium or reduced-sodium. Regular canned tomato sauce and paste that are not marked as low-sodium or reduced-sodium. Regular tomato and vegetable juices that are not marked as low-sodium or reduced-sodium. Rosita Fire. Olives. Grains Baked goods made with fat, such as croissants, muffins, or some breads. Dry pasta or rice meal packs. Meats and other proteins Fatty cuts of meat. Ribs. Fried meat. Tomasa Blase. Bologna, salami, and other precooked or cured meats, such as sausages or meat loaves, that are not lean and low in sodium. Fat from the back of a pig (fatback). Bratwurst. Salted nuts and seeds. Canned beans with added salt. Canned  or smoked fish. Whole eggs or egg yolks. Chicken or [place] with skin. Dairy Whole or 2% milk, cream, and half-and-half. Whole or full-fat cream cheese. Whole-fat or sweetened yogurt. Full-fat cheese. Nondairy creamers. Whipped toppings. Processed cheese and cheese spreads. Fats and oils Butter. Stick margarine. Lard. Shortening. Ghee. Bacon fat. Tropical oils, such as coconut, palm kernel, or palm oil. Seasonings and condiments Onion salt, garlic salt, seasoned salt, table salt, and sea salt. Worcestershire sauce. Tartar sauce. Barbecue sauce. Teriyaki sauce. Soy sauce, including reduced-sodium soy sauce. Steak sauce. Canned and packaged gravies. Fish sauce. Oyster sauce. Cocktail sauce. Store-bought  horseradish. Ketchup. Mustard. Meat flavorings and tenderizers. Bouillon cubes. Hot sauces. Pre-made or packaged marinades. Pre-made or packaged taco seasonings. Relishes. Regular salad dressings. Other foods Salted popcorn and pretzels. The items listed above may not be all the foods and drinks you should avoid. Talk to a dietitian to learn more. Where to find more information National Heart, Lung, and Blood Institute (NHLBI): BuffaloDryCleaner.gl American Heart Association (AHA): heart.org Academy of Nutrition and Dietetics: eatright.org National Kidney Foundation (NKF): kidney.org This information is not intended to replace advice given to you by your health care provider. Make sure you discuss any questions you have with your health care provider. Document Revised: 04/12/2022 Document Reviewed: 04/12/2022 Elsevier Patient Education  2024 [employer].

## 2023-09-05 ENCOUNTER — Ambulatory Visit (INDEPENDENT_AMBULATORY_CARE_PROVIDER_SITE_OTHER): Admitting: Nurse Practitioner

## 2023-09-05 ENCOUNTER — Encounter: Payer: Self-pay | Admitting: Nurse Practitioner

## 2023-09-05 VITALS — BP 134/70 | HR 82 | Temp 98.0°F | Ht 67.0 in | Wt 235.0 lb

## 2023-09-05 DIAGNOSIS — D1431 Benign neoplasm of right bronchus and lung: Secondary | ICD-10-CM | POA: Diagnosis not present

## 2023-09-05 DIAGNOSIS — I10 Essential (primary) hypertension: Secondary | ICD-10-CM | POA: Diagnosis not present

## 2023-09-05 MED ORDER — METOPROLOL SUCCINATE ER 25 MG PO TB24: 25.0000 mg | ORAL_TABLET | Freq: Every day | ORAL | 3 refills | Status: AC

## 2023-09-05 NOTE — Progress Notes (Signed)
 BP 134/70   Pulse 82   Temp 98 F (36.7 C) (Oral)   Ht 5\' 7"  (1.702 m)   Wt 235 lb (106.6 kg)   SpO2 97%   BMI 36.81 kg/m    Subjective:    Patient ID: Melissa Merritt, female    DOB: 04-16-1950, 73 y.o.   MRN: [id number]  HPI: Melissa Merritt is a 73 y.o. female  Chief Complaint  Patient presents with   Hypertension   HYPERTENSION without Chronic Kidney Disease Taking Valsartan , Metoprolol , Amlodipine , Spironolactone. Echo 06/15/22 with LV function LVEF 45-50% with normal LVH which is consistent with previous echos, going back to 2013. Last saw cardiology on 07/11/23.  Needs yearly chest x-ray to check on benign tumor to right lower lung. Hypertension status: stable  Satisfied with current treatment? yes Duration of hypertension: chronic BP monitoring frequency:  a few times a week BP range: 130-140/70 BP medication side effects:  no Medication compliance: good compliance Aspirin: no Recurrent headaches: no Visual changes: no Palpitations: no Dyspnea: no Chest pain: no Lower extremity edema: no Dizzy/lightheaded: no   Relevant past medical, surgical, family and social history reviewed and updated as indicated. Interim medical history since our last visit reviewed. Allergies and medications reviewed and updated.  Review of Systems  Constitutional:  Negative for activity change, appetite change, diaphoresis, fatigue and fever.  Respiratory:  Negative for cough, chest tightness, shortness of breath and wheezing.   Cardiovascular:  Negative for chest pain, palpitations and leg swelling.  Gastrointestinal: Negative.   Neurological: Negative.   Psychiatric/Behavioral: Negative.      Per HPI unless specifically indicated above     Objective:     BP 134/70   Pulse 82   Temp 98 F (36.7 C) (Oral)   Ht 5\' 7"  (1.702 m)   Wt 235 lb (106.6 kg)   SpO2 97%   BMI 36.81 kg/m   Wt Readings from Last 3 Encounters:  09/05/23 235 lb (106.6 kg)  08/19/23 233 lb 4.8 oz  (105.8 kg)  07/29/23 235 lb 12.8 oz (107 kg)    Physical Exam Vitals and nursing note reviewed.  Constitutional:      General: She is awake. She is not in acute distress.    Appearance: She is well-developed and well-groomed. She is obese. She is not ill-appearing or toxic-appearing.  HENT:     Head: Normocephalic.     Right Ear: Hearing and external ear normal.     Left Ear: Hearing and external ear normal.  Eyes:     General: Lids are normal.        Right eye: No discharge.        Left eye: No discharge.     Conjunctiva/sclera: Conjunctivae normal.     Pupils: Pupils are equal, round, and reactive to light.  Neck:     Thyroid : No thyromegaly.     Vascular: No carotid bruit.  Cardiovascular:     Rate and Rhythm: Normal rate and regular rhythm.     Heart sounds: Normal heart sounds. No murmur heard.    No gallop.  Pulmonary:     Effort: Pulmonary effort is normal. No accessory muscle usage or respiratory distress.     Breath sounds: Normal breath sounds.  Abdominal:     General: Bowel sounds are normal. There is no distension.     Palpations: Abdomen is soft.     Tenderness: There is no abdominal tenderness.  Musculoskeletal:  Cervical back: Normal range of motion and neck supple.     Right lower leg: No edema.     Left lower leg: No edema.  Lymphadenopathy:     Cervical: No cervical adenopathy.  Skin:    General: Skin is warm and dry.  Neurological:     Mental Status: She is alert and oriented to person, place, and time.     Deep Tendon Reflexes: Reflexes are normal and symmetric.     Reflex Scores:      Brachioradialis reflexes are 2+ on the right side and 2+ on the left side.      Patellar reflexes are 2+ on the right side and 2+ on the left side. Psychiatric:        Attention and Perception: Attention normal.        Mood and Affect: Mood normal.        Speech: Speech normal.        Behavior: Behavior normal. Behavior is cooperative.        Thought Content:  Thought content normal.    Results for orders placed or performed in visit on 08/19/23  Vitamin B12   Collection Time: 08/19/23  9:41 AM  Result Value Ref Range   Vitamin B-12 824 180 - 914 pg/mL  Cancer antigen 27.29   Collection Time: 08/19/23  9:42 AM  Result Value Ref Range   CA 27.29 9.7 0.0 - 38.6 U/mL  CMP (Cancer Center only)   Collection Time: 08/19/23  9:42 AM  Result Value Ref Range   Sodium 136 135 - 145 mmol/L   Potassium 3.9 3.5 - 5.1 mmol/L   Chloride 100 98 - 111 mmol/L   CO2 27 22 - 32 mmol/L   Glucose, Bld 139 (H) 70 - 99 mg/dL   BUN 24 (H) 8 - 23 mg/dL   Creatinine 9.60 (H) 4.54 - 1.00 mg/dL   Calcium 09.8 8.9 - 11.9 mg/dL   Total Protein 7.4 6.5 - 8.1 g/dL   Albumin 4.0 3.5 - 5.0 g/dL   AST 20 15 - 41 U/L   ALT 25 0 - 44 U/L   Alkaline Phosphatase 77 38 - 126 U/L   Total Bilirubin 0.6 0.0 - 1.2 mg/dL   GFR, Estimated 52 (L) >60 mL/min   Anion gap 9 5 - 15  CBC with Differential (Cancer Center Only)   Collection Time: 08/19/23  9:42 AM  Result Value Ref Range   WBC Count 8.3 4.0 - 10.5 K/uL   RBC 4.25 3.87 - 5.11 MIL/uL   Hemoglobin 13.0 12.0 - 15.0 g/dL   HCT 14.7 82.9 - 56.2 %   MCV 90.1 80.0 - 100.0 fL   MCH 30.6 26.0 - 34.0 pg   MCHC 33.9 30.0 - 36.0 g/dL   RDW 13.0 86.5 - 78.4 %   Platelet Count 209 150 - 400 K/uL   nRBC 0.0 0.0 - 0.2 %   Neutrophils Relative % 69 %   Neutro Abs 5.8 1.7 - 7.7 K/uL   Lymphocytes Relative 14 %   Lymphs Abs 1.1 0.7 - 4.0 K/uL   Monocytes Relative 9 %   Monocytes Absolute 0.8 0.1 - 1.0 K/uL   Eosinophils Relative 5 %   Eosinophils Absolute 0.4 0.0 - 0.5 K/uL   Basophils Relative 1 %   Basophils Absolute 0.1 0.0 - 0.1 K/uL   Immature Granulocytes 2 %   Abs Immature Granulocytes 0.16 (H) 0.00 - 0.07 K/uL  Assessment & Plan:   Problem List Items Addressed This Visit       Cardiovascular and Mediastinum   Essential hypertension   Chronic, stable.  BP stable on check today, at goal for age.  Continue  Valsartan  max dosing 320 MG and continue current Spironolactone and Metoprolol  + Amlodipine  at 5 MG, no swelling with this dose but had swelling with 10 MG.  Monitor K+ level closely.  Continue to monitor BP at home regularly and document + focus on DASH diet.  LABS: up to date. Had trend up in BP with Aromasin  on board.      Relevant Medications   metoprolol  succinate (TOPROL -XL) 25 MG 24 hr tablet     Respiratory   Benign tumor of lung - Primary   To right lower lobe, will obtain yearly x-ray.      Relevant Orders   DG Chest 2 View     Follow up plan: Return in about 5 months (around 01/29/2024) for HTN/HLD, COPD, IFG, ANEMIA, BREAST CA.

## 2023-09-05 NOTE — Assessment & Plan Note (Signed)
 To right lower lobe, will obtain yearly x-ray.

## 2023-09-05 NOTE — Assessment & Plan Note (Signed)
 Chronic, stable.  BP stable on check today, at goal for age.  Continue Valsartan  max dosing 320 MG and continue current Spironolactone and Metoprolol  + Amlodipine  at 5 MG, no swelling with this dose but had swelling with 10 MG.  Monitor K+ level closely.  Continue to monitor BP at home regularly and document + focus on DASH diet.  LABS: up to date. Had trend up in BP with Aromasin  on board.

## 2023-09-09 ENCOUNTER — Ambulatory Visit
Admission: RE | Admit: 2023-09-09 | Discharge: 2023-09-09 | Disposition: A | Discharge status: Home / Self Care | Source: Ambulatory Visit | Attending: Nurse Practitioner | Admitting: Nurse Practitioner

## 2023-09-09 DIAGNOSIS — D1431 Benign neoplasm of right bronchus and lung: Secondary | ICD-10-CM | POA: Insufficient documentation

## 2023-09-09 DIAGNOSIS — Z87891 Personal history of nicotine dependence: Secondary | ICD-10-CM | POA: Diagnosis not present

## 2023-09-15 ENCOUNTER — Ambulatory Visit: Payer: Self-pay | Admitting: Nurse Practitioner

## 2023-09-15 NOTE — Progress Notes (Signed)
 Contacted via MyChart    Good morning Melissa Merritt, your chest imaging remains stable.  Any questions?

## 2023-10-02 ENCOUNTER — Other Ambulatory Visit: Payer: Self-pay | Admitting: Internal Medicine

## 2023-10-21 ENCOUNTER — Encounter: Payer: Self-pay | Admitting: Internal Medicine

## 2023-12-17 DIAGNOSIS — G4733 Obstructive sleep apnea (adult) (pediatric): Secondary | ICD-10-CM | POA: Diagnosis not present

## 2023-12-17 DIAGNOSIS — Z902 Acquired absence of lung [part of]: Secondary | ICD-10-CM | POA: Diagnosis not present

## 2023-12-17 DIAGNOSIS — Z6836 Body mass index (BMI) 36.0-36.9, adult: Secondary | ICD-10-CM | POA: Diagnosis not present

## 2023-12-17 DIAGNOSIS — Z853 Personal history of malignant neoplasm of breast: Secondary | ICD-10-CM | POA: Diagnosis not present

## 2023-12-17 DIAGNOSIS — E66812 Obesity, class 2: Secondary | ICD-10-CM | POA: Diagnosis not present

## 2023-12-17 DIAGNOSIS — E6609 Other obesity due to excess calories: Secondary | ICD-10-CM | POA: Diagnosis not present

## 2023-12-17 DIAGNOSIS — J449 Chronic obstructive pulmonary disease, unspecified: Secondary | ICD-10-CM | POA: Diagnosis not present

## 2023-12-25 DIAGNOSIS — R1310 Dysphagia, unspecified: Secondary | ICD-10-CM | POA: Diagnosis not present

## 2023-12-25 DIAGNOSIS — I509 Heart failure, unspecified: Secondary | ICD-10-CM | POA: Diagnosis not present

## 2023-12-25 DIAGNOSIS — E010 Iodine-deficiency related diffuse (endemic) goiter: Secondary | ICD-10-CM | POA: Diagnosis not present

## 2023-12-25 DIAGNOSIS — I10 Essential (primary) hypertension: Secondary | ICD-10-CM | POA: Diagnosis not present

## 2023-12-25 DIAGNOSIS — Z1152 Encounter for screening for COVID-19: Secondary | ICD-10-CM | POA: Diagnosis not present

## 2023-12-25 DIAGNOSIS — J449 Chronic obstructive pulmonary disease, unspecified: Secondary | ICD-10-CM | POA: Diagnosis not present

## 2023-12-25 DIAGNOSIS — I110 Hypertensive heart disease with heart failure: Secondary | ICD-10-CM | POA: Diagnosis not present

## 2023-12-26 ENCOUNTER — Encounter: Payer: Self-pay | Admitting: Internal Medicine

## 2023-12-26 DIAGNOSIS — E010 Iodine-deficiency related diffuse (endemic) goiter: Secondary | ICD-10-CM | POA: Diagnosis not present

## 2023-12-26 DIAGNOSIS — R1310 Dysphagia, unspecified: Secondary | ICD-10-CM | POA: Diagnosis not present

## 2023-12-27 ENCOUNTER — Ambulatory Visit
Admission: RE | Admit: 2023-12-27 | Discharge: 2023-12-27 | Disposition: A | Discharge status: Home / Self Care | Source: Ambulatory Visit | Attending: Nurse Practitioner | Admitting: Nurse Practitioner

## 2023-12-27 ENCOUNTER — Encounter: Payer: Self-pay | Admitting: Nurse Practitioner

## 2023-12-27 ENCOUNTER — Encounter: Payer: Self-pay | Admitting: Internal Medicine

## 2023-12-27 ENCOUNTER — Ambulatory Visit (INDEPENDENT_AMBULATORY_CARE_PROVIDER_SITE_OTHER): Admitting: Nurse Practitioner

## 2023-12-27 VITALS — BP 134/79 | HR 87 | Temp 98.1°F | Resp 15 | Ht 67.01 in | Wt 230.8 lb

## 2023-12-27 DIAGNOSIS — B370 Candidal stomatitis: Secondary | ICD-10-CM

## 2023-12-27 DIAGNOSIS — R1311 Dysphagia, oral phase: Secondary | ICD-10-CM | POA: Diagnosis not present

## 2023-12-27 DIAGNOSIS — R221 Localized swelling, mass and lump, neck: Secondary | ICD-10-CM | POA: Diagnosis not present

## 2023-12-27 DIAGNOSIS — E010 Iodine-deficiency related diffuse (endemic) goiter: Secondary | ICD-10-CM | POA: Insufficient documentation

## 2023-12-27 DIAGNOSIS — Z6835 Body mass index (BMI) 35.0-35.9, adult: Secondary | ICD-10-CM | POA: Diagnosis not present

## 2023-12-27 DIAGNOSIS — R1310 Dysphagia, unspecified: Secondary | ICD-10-CM | POA: Diagnosis not present

## 2023-12-27 DIAGNOSIS — E66812 Obesity, class 2: Secondary | ICD-10-CM

## 2023-12-27 NOTE — Assessment & Plan Note (Signed)
BMI 36.14.  Recommended eating smaller high protein, low fat meals more frequently and exercising 30 mins a day 5 times a week with a goal of 10-15lb weight loss in the next 3 months. Patient voiced their understanding and motivation to adhere to these recommendations.

## 2023-12-27 NOTE — Patient Instructions (Signed)
 Problems With Swallowing (Dysphagia): What to Know  Dysphagia means having trouble swallowing. It happens when food and drinks stick in your throat on the way down to your stomach, or when food takes longer to get to your stomach than usual. You may have problems swallowing food, liquids, or both. You may also have pain while trying to swallow. It may take you more time and effort to swallow something. What are the causes? Dysphagia may be caused by: Muscle problems. These may make it hard for you to move food and liquids through your esophagus, which is the tube that connects your mouth to your stomach. Blockages. You may have ulcers, scar tissue, or inflammation that blocks the normal passage of food and liquids. Causes of these problems include: Acid reflux from your stomach into your esophagus. Infections. Radiation treatment for cancer. Taking medicines without enough fluids to help them go down into your stomach. Stroke. This can affect the nerves and make it hard to swallow. Nerve problems. These prevent signals from being sent to the muscles of your esophagus. These signals tell the muscles to squeeze (contract) and move what you swallow down to your stomach. Globus pharyngeus. This is a common problem that can make you feel like something is stuck in your throat or like swallowing is difficult, even though nothing is wrong with the swallowing passages. Certain conditions, such as cerebral palsy or Parkinson's disease. What are the signs or symptoms? Common symptoms include: A feeling that solids or liquids are stuck in your throat on the way down to the stomach. Pain while swallowing. Coughing or gagging while trying to swallow. Other symptoms include: Food moving back from your stomach to your mouth. This is called regurgitation. Noises coming from your throat. Chest discomfort when swallowing. A feeling of fullness when swallowing. Drooling, especially when the throat is  blocked. Heartburn. How is this diagnosed? Dysphagia may be diagnosed by doing tests. These may include: Video fluoroscopic swallow study, also called a barium swallow. In this test, you'll swallow a white liquid that sticks to the inside of your esophagus. X-ray images are then taken. Endoscopy. In this test, a flexible scope is put down your throat to look at your esophagus and your stomach. CT scans or an MRI. How is this treated? Treatment for dysphagia depends on the cause: If it's caused by acid reflux or infection, medicines may be used. These may include antibiotics or heartburn medicines. If it's caused by problems with the muscles, you may need swallowing therapy. This can help you strengthen your swallowing muscles. You may have to do specific exercises to strengthen the muscles or stretch them. If it's caused by a blockage or mass, procedures to remove the blockage may be done. You may need surgery and a feeding tube. You may also need to make diet changes. Talk with your health care provider about specific changes. Follow these instructions at home: Medicines Take your medicines only as told. If you were given antibiotics, take them as told. Do not stop taking them even if you start to feel better. Eating and drinking  Make any diet changes as told by your provider. Work with an Financial controller in healthy eating called a dietitian. They can help you create an eating plan to make sure you get the nutrients you need. Eat soft foods that are easier to swallow. Cut your food into small pieces and eat slowly. Take small bites. Eat and drink only when you're sitting upright. Do not drink alcohol or  caffeine . If you need help quitting, ask your provider. General instructions Check your weight every day to make sure you're not losing weight. Do not smoke, vape, or use nicotine  or tobacco. Contact a health care provider if: You lose weight because you can't swallow. You cough when you drink  liquids. You cough up partially digested food. Get help right away if: You can't swallow your saliva. You have shortness of breath, a fever, or both. Your voice is hoarse and you have trouble swallowing. These symptoms may be an emergency. Call 911 right away. Do not wait to see if the symptoms will go away. Do not drive yourself to the hospital. This information is not intended to replace advice given to you by your health care provider. Make sure you discuss any questions you have with your health care provider. Document Revised: 02/21/2023 Document Reviewed: 02/21/2023 Elsevier Patient Education  2025 [employer].

## 2023-12-27 NOTE — Progress Notes (Signed)
 BP 134/79 (BP Location: Left Arm, Patient Position: Sitting, Cuff Size: Large)   Pulse 87   Temp 98.1 F (36.7 C) (Oral)   Resp 15   Ht 5' 7.01 (1.702 m)   Wt 230 lb 12.8 oz (104.7 kg)   SpO2 94%   BMI 36.14 kg/m    Subjective:    Patient ID: Melissa Merritt, female    DOB: March 16, 1951, 73 y.o.   MRN: [id number]  HPI: Melissa Merritt is a 73 y.o. female  Chief Complaint  Patient presents with   Hospitalization Follow-up    ED visit 12/25/2023 for SOB. Since being home a little better. Having a hard time swallowing and clearing her throat. Sounds raspy. Was told her throat is very red and thyroid  is enlarged.    ER FOLLOW UP Was seen in ER on 12/25/23 due to having an issues with swallowing.  Went to [facility]. She initially went into ER due to difficulty swallowing and breathing. They recommended she stop iron pills. Was given course of Fluconazole. Recommended she obtain a referral for endoscopy + further thyroid  imaging. CT scan performed in ER, thyromegaly was noted + chronic right-sided sialolithiasis.  Has not seen ENT in the past. She denies any new medications prior to ER or OTC medications.  Was taking Benadryl  at night, stopped taking this due to having a very dry mouth.  Uses Trelegy and Albuterol  for asthma at home. Saw pulmonary last on 12/17/23. Reports she does not swish/spit with water after Trelegy use.  Has been afraid to eat since leaving hospital, drinking Liquid IV and water + eating soup.   Time since discharge: 12/26/23 Hospital/facility: [facility] Diagnosis: Dysphagia Procedures/tests: CT scan and labs Consultants: none New medications: Fluconazole Discharge instructions:  Follow-up with PCP Status: feels a little better  Relevant past medical, surgical, family and social history reviewed and updated as indicated. Interim medical history since our last visit reviewed. Allergies and medications reviewed and updated.  Review of Systems   Constitutional:  Positive for appetite change (due to swallowing issue). Negative for activity change, chills, fatigue and fever.  Respiratory:  Positive for cough (occasional) and shortness of breath (occasional). Negative for chest tightness and wheezing.   Cardiovascular:  Negative for chest pain, palpitations and leg swelling.  Gastrointestinal: Negative.   Endocrine: Negative for cold intolerance and heat intolerance.  Neurological: Negative.   Psychiatric/Behavioral: Negative.      Per HPI unless specifically indicated above     Objective:    BP 134/79 (BP Location: Left Arm, Patient Position: Sitting, Cuff Size: Large)   Pulse 87   Temp 98.1 F (36.7 C) (Oral)   Resp 15   Ht 5' 7.01 (1.702 m)   Wt 230 lb 12.8 oz (104.7 kg)   SpO2 94%   BMI 36.14 kg/m   Wt Readings from Last 3 Encounters:  12/27/23 230 lb 12.8 oz (104.7 kg)  09/05/23 235 lb (106.6 kg)  08/19/23 233 lb 4.8 oz (105.8 kg)    Physical Exam Vitals and nursing note reviewed.  Constitutional:      General: She is awake. She is not in acute distress.    Appearance: She is well-developed and well-groomed. She is obese. She is not ill-appearing or toxic-appearing.  HENT:     Head: Normocephalic.     Right Ear: Hearing, tympanic membrane, ear canal and external ear normal.     Left Ear: Hearing, tympanic membrane, ear canal and external ear normal.  Nose: Nose normal.     Right Sinus: No maxillary sinus tenderness or frontal sinus tenderness.     Left Sinus: No maxillary sinus tenderness or frontal sinus tenderness.     Mouth/Throat:     Mouth: Mucous membranes are dry.     Pharynx: Posterior oropharyngeal erythema (mild) present. No pharyngeal swelling or oropharyngeal exudate.     Comments: White film with areas of erythema to entire tongue area. Eyes:     General: Lids are normal.        Right eye: No discharge.        Left eye: No discharge.     Conjunctiva/sclera: Conjunctivae normal.      Pupils: Pupils are equal, round, and reactive to light.  Neck:     Thyroid : Thyromegaly (mild) present.     Vascular: No carotid bruit.  Cardiovascular:     Rate and Rhythm: Normal rate and regular rhythm.     Heart sounds: Normal heart sounds. No murmur heard.    No gallop.  Pulmonary:     Effort: Pulmonary effort is normal. No accessory muscle usage or respiratory distress.     Breath sounds: Normal breath sounds. No decreased breath sounds, wheezing or rales.  Abdominal:     General: Bowel sounds are normal. There is no distension.     Palpations: Abdomen is soft.     Tenderness: There is no abdominal tenderness.  Musculoskeletal:     Cervical back: Normal range of motion and neck supple.     Right lower leg: No edema.     Left lower leg: No edema.  Lymphadenopathy:     Cervical: No cervical adenopathy.  Skin:    General: Skin is warm and dry.  Neurological:     Mental Status: She is alert and oriented to person, place, and time.     Cranial Nerves: Cranial nerves 2-12 are intact.     Coordination: Coordination is intact.     Gait: Gait is intact.     Deep Tendon Reflexes: Reflexes are normal and symmetric.     Reflex Scores:      Brachioradialis reflexes are 2+ on the right side and 2+ on the left side.      Patellar reflexes are 2+ on the right side and 2+ on the left side. Psychiatric:        Attention and Perception: Attention normal.        Mood and Affect: Mood normal.        Speech: Speech normal.        Behavior: Behavior normal. Behavior is cooperative.        Thought Content: Thought content normal.     Results for orders placed or performed in visit on 08/19/23  Vitamin B12   Collection Time: 08/19/23  9:41 AM  Result Value Ref Range   Vitamin B-12 824 180 - 914 pg/mL  Cancer antigen 27.29   Collection Time: 08/19/23  9:42 AM  Result Value Ref Range   CA 27.29 9.7 0.0 - 38.6 U/mL  CMP (Cancer Center only)   Collection Time: 08/19/23  9:42 AM  Result  Value Ref Range   Sodium 136 135 - 145 mmol/L   Potassium 3.9 3.5 - 5.1 mmol/L   Chloride 100 98 - 111 mmol/L   CO2 27 22 - 32 mmol/L   Glucose, Bld 139 (H) 70 - 99 mg/dL   BUN 24 (H) 8 - 23 mg/dL   Creatinine 8.87 (H) 9.55 -  1.00 mg/dL   Calcium 89.9 8.9 - 89.6 mg/dL   Total Protein 7.4 6.5 - 8.1 g/dL   Albumin 4.0 3.5 - 5.0 g/dL   AST 20 15 - 41 U/L   ALT 25 0 - 44 U/L   Alkaline Phosphatase 77 38 - 126 U/L   Total Bilirubin 0.6 0.0 - 1.2 mg/dL   GFR, Estimated 52 (L) >60 mL/min   Anion gap 9 5 - 15  CBC with Differential (Cancer Center Only)   Collection Time: 08/19/23  9:42 AM  Result Value Ref Range   WBC Count 8.3 4.0 - 10.5 K/uL   RBC 4.25 3.87 - 5.11 MIL/uL   Hemoglobin 13.0 12.0 - 15.0 g/dL   HCT 61.6 63.9 - 53.9 %   MCV 90.1 80.0 - 100.0 fL   MCH 30.6 26.0 - 34.0 pg   MCHC 33.9 30.0 - 36.0 g/dL   RDW 86.9 88.4 - 84.4 %   Platelet Count 209 150 - 400 K/uL   nRBC 0.0 0.0 - 0.2 %   Neutrophils Relative % 69 %   Neutro Abs 5.8 1.7 - 7.7 K/uL   Lymphocytes Relative 14 %   Lymphs Abs 1.1 0.7 - 4.0 K/uL   Monocytes Relative 9 %   Monocytes Absolute 0.8 0.1 - 1.0 K/uL   Eosinophils Relative 5 %   Eosinophils Absolute 0.4 0.0 - 0.5 K/uL   Basophils Relative 1 %   Basophils Absolute 0.1 0.0 - 0.1 K/uL   Immature Granulocytes 2 %   Abs Immature Granulocytes 0.16 (H) 0.00 - 0.07 K/uL      Assessment & Plan:   Problem List Items Addressed This Visit       Digestive   Oral thrush   Noted on exam, significant thrush.  Educated her on importance of swish/spit after Trelegy use.  Continue Fluconazole as ordered by ER.  Will get into ENT to ensure no yeast or further issues pharynx.      Relevant Medications   fluconazole (DIFLUCAN) 200 MG tablet   Other Relevant Orders   Ambulatory referral to ENT   Oral phase dysphagia - Primary   New onset.  Labs in ER reassuring.  CT did noted thyromegaly, will obtain ultrasound to further assess. Chronic right-sided  sialolithiasis noted. Get into ENT for further assessment and recommendations. Urgent referral to GI for assessment and possible EGD.  She is aware to immediately return to ER if any worsening symptoms present.  Continue Fluconazole as ordered in ER setting.      Relevant Orders   CBC with Differential/Platelet   Ambulatory referral to Gastroenterology     Endocrine   Thyromegaly   Noted on CT in ER recently, will perform ultrasound urgently to further assess and ensure not underlying cause of current issues swallowing.  Determine next steps after imaging results return.      Relevant Orders   T4, free   Thyroid  peroxidase antibody   TSH   US  THYROID      Other   Obesity   BMI 36.14.  Recommended eating smaller high protein, low fat meals more frequently and exercising 30 mins a day 5 times a week with a goal of 10-15lb weight loss in the next 3 months. Patient voiced their understanding and motivation to adhere to these recommendations.        Time: 25 minutes, >50% spent counseling/or care coordination   Follow up plan: Return in about 1 week (around 01/03/2024) for Dysphagia.

## 2023-12-27 NOTE — Assessment & Plan Note (Signed)
 Noted on CT in ER recently, will perform ultrasound urgently to further assess and ensure not underlying cause of current issues swallowing.  Determine next steps after imaging results return.

## 2023-12-27 NOTE — Assessment & Plan Note (Signed)
 New onset.  Labs in ER reassuring.  CT did noted thyromegaly, will obtain ultrasound to further assess. Chronic right-sided sialolithiasis noted. Get into ENT for further assessment and recommendations. Urgent referral to GI for assessment and possible EGD.  She is aware to immediately return to ER if any worsening symptoms present.  Continue Fluconazole as ordered in ER setting.

## 2023-12-27 NOTE — Assessment & Plan Note (Signed)
 Noted on exam, significant thrush.  Educated her on importance of swish/spit after Trelegy use.  Continue Fluconazole as ordered by ER.  Will get into ENT to ensure no yeast or further issues pharynx.

## 2023-12-28 ENCOUNTER — Ambulatory Visit: Payer: Self-pay | Admitting: Nurse Practitioner

## 2023-12-28 DIAGNOSIS — E042 Nontoxic multinodular goiter: Secondary | ICD-10-CM | POA: Insufficient documentation

## 2023-12-28 LAB — CBC WITH DIFFERENTIAL/PLATELET
Basophils Absolute: 0.1 x10E3/uL (ref 0.0–0.2)
Basos: 1 %
EOS (ABSOLUTE): 0.2 x10E3/uL (ref 0.0–0.4)
Eos: 2 %
Hematocrit: 39.6 % (ref 34.0–46.6)
Hemoglobin: 13.1 g/dL (ref 11.1–15.9)
Immature Grans (Abs): 0.1 x10E3/uL (ref 0.0–0.1)
Immature Granulocytes: 1 %
Lymphocytes Absolute: 1 x10E3/uL (ref 0.7–3.1)
Lymphs: 10 %
MCH: 30.8 pg (ref 26.6–33.0)
MCHC: 33.1 g/dL (ref 31.5–35.7)
MCV: 93 fL (ref 79–97)
Monocytes Absolute: 1 x10E3/uL — ABNORMAL HIGH (ref 0.1–0.9)
Monocytes: 10 %
Neutrophils Absolute: 7.7 x10E3/uL — ABNORMAL HIGH (ref 1.4–7.0)
Neutrophils: 76 %
Platelets: 238 x10E3/uL (ref 150–450)
RBC: 4.25 x10E6/uL (ref 3.77–5.28)
RDW: 12.7 % (ref 11.7–15.4)
WBC: 10.1 x10E3/uL (ref 3.4–10.8)

## 2023-12-28 LAB — T4, FREE: Free T4: 1.36 ng/dL (ref 0.82–1.77)

## 2023-12-28 LAB — THYROID PEROXIDASE ANTIBODY: Thyroperoxidase Ab SerPl-aCnc: 26 [IU]/mL (ref 0–34)

## 2023-12-28 LAB — TSH: TSH: 1.55 u[IU]/mL (ref 0.450–4.500)

## 2023-12-28 NOTE — Progress Notes (Signed)
 Contacted via MyChart, but please call Monday to ensure she checked and saw results + see if she is scheduled with ENT and alert her to discuss nodules with them, as needs biopsy on two areas. Fax results to [facility] if she is scheduled.   Good day Janeth, your thyroid  imaging has returned: Your thyroid  is showing multiple nodules, two of them are in need of fine needle biopsy, which ENT can perform.  Have you scheduled with them? Let me know as this was not on referral since we did not have results.  Please let them know and we will fax results over to them. Two of the other nodules require repeat ultrasound in one year.  Any questions? Keep being amazing!!  Thank you for allowing me to participate in your care.  I appreciate you. Kindest regards, Maevis Mumby

## 2023-12-28 NOTE — Progress Notes (Signed)
 Contacted via MyChart  Good evening Melissa Merritt, your labs have returned and overall area stable, a couple mid elevations, but we can monitor these and repeat future visit.  Any questions? Keep being amazing!!  Thank you for allowing me to participate in your care.  I appreciate you. Kindest regards, Providencia Hottenstein

## 2023-12-30 DIAGNOSIS — R070 Pain in throat: Secondary | ICD-10-CM | POA: Diagnosis not present

## 2023-12-30 DIAGNOSIS — E041 Nontoxic single thyroid nodule: Secondary | ICD-10-CM | POA: Diagnosis not present

## 2023-12-30 DIAGNOSIS — R1310 Dysphagia, unspecified: Secondary | ICD-10-CM | POA: Diagnosis not present

## 2023-12-31 ENCOUNTER — Telehealth: Payer: Self-pay

## 2023-12-31 NOTE — Telephone Encounter (Unsigned)
 Copied from CRM [id number]. Topic: Clinical - Lab/Test Results >> Dec 30, 2023  4:34 PM Lauren C wrote: Reason for CRM: Pt is returning call from Feliciano Cena BRAVO, CMA for lab and imaging (US  THYROID ) results. Relayed labs, unable to relay imaging.

## 2024-01-01 ENCOUNTER — Other Ambulatory Visit: Payer: Self-pay | Admitting: Unknown Physician Specialty

## 2024-01-01 DIAGNOSIS — E041 Nontoxic single thyroid nodule: Secondary | ICD-10-CM

## 2024-01-01 NOTE — Telephone Encounter (Signed)
 The patient called back returning a call to [place]. I spoke with Iris and she had me transfer the patient for further assistance.

## 2024-01-01 NOTE — Telephone Encounter (Signed)
 Spoke with patient. Reviewed information with patient. No further needs.

## 2024-01-02 ENCOUNTER — Other Ambulatory Visit: Payer: Self-pay | Admitting: Internal Medicine

## 2024-01-03 ENCOUNTER — Encounter: Payer: Self-pay | Admitting: Internal Medicine

## 2024-01-05 NOTE — Patient Instructions (Signed)
 Thyroid Nodule  A thyroid nodule is an isolated growth of thyroid cells that forms a lump in the thyroid gland. The thyroid gland is a butterfly-shaped gland found in the lower front of the neck. It sends chemical messengers (hormones) through the blood to all parts of the body. These hormones are important in regulating body temperature and helping the body use energy. Thyroid nodules are common. Most are not cancerous (are benign). You may have one nodule or several nodules. There are different types of thyroid nodules. They include nodules that: Grow and fill with fluid (thyroid cysts). Produce too much thyroid hormone (hot nodules or hyperthyroid). Produce no thyroid hormone (cold nodules or hypothyroid). Form from cancer cells (thyroid cancers). What are the causes? In most cases, the cause of thyroid nodules is not known. What increases the risk? The following factors may make you more likely to develop thyroid nodules: Age. Thyroid nodules are more common in people who are older than 45 years. Female gender. A family history that includes: Thyroid nodules. Pheochromocytoma. Thyroid carcinoma. Hyperparathyroidism. Certain thyroid diseases, such as Hashimoto's thyroiditis. Lack of iodine in your diet. A history of head and neck radiation, such as from cancer treatments. Type 2 diabetes. What are the signs or symptoms? In many cases, there are no symptoms. If you have symptoms, they may include: A lump in your lower neck. Feeling pressure, fullness, or a tickle in your throat. Pain in your neck, jaw, or ear. Having trouble swallowing or breathing. Hot nodules may cause: Weight loss. Warm, flushed skin. Feeling hot. Feeling nervous. A rapid or irregular heartbeat. Cold nodules may cause: Weight gain. Dry skin. Hair loss, brittle hair, or both. Feeling cold. Fatigue. Thyroid cancer nodules may cause: Hard nodules that can be felt along the thyroid  gland. Hoarseness. Lumps in the tissue (lymph nodes) near your thyroid gland. How is this diagnosed? A thyroid nodule may be felt by your health care provider during a physical exam. This condition may also be diagnosed based on your symptoms. You may also have tests, including: Blood tests to check how well your thyroid is working. An ultrasound. This may be done to confirm the diagnosis. A biopsy. This involves taking a sample from the nodule and looking at it under a microscope. A thyroid scan. This test creates an image of the thyroid gland using a radioactive tracer. Imaging tests such as an MRI or CT scan. These may be done if: A nodule is large. A nodule is blocking your airway. Cancer is suspected. How is this treated? Treatment depends on the cause and size of your nodule or nodules. If a nodule is benign, treatment may not be necessary. Your health care provider may monitor the nodule to see if it goes away without treatment. If a nodule continues to grow, is cancerous, or does not go away, treatment may be needed. Treatment may include: Having a cystic nodule drained with a needle. Ablation therapy. In this treatment, alcohol is injected into the area of the nodule to destroy the cells. Ablation with heat may also be used. This is called thermal ablation. Radioactive iodine. In this treatment, radioactive iodine is given as a pill or liquid that you drink. This substance causes the thyroid nodule to shrink. Surgery to remove the nodule or nodules. Part or all of your thyroid gland may also need to be removed. Medicines to treat hyperthyroidism. Follow these instructions at home: Pay attention to any changes in your thyroid nodule or nodules. Take over-the-counter  and prescription medicines only as told by your health care provider. Keep all follow-up visits. This is important. Contact a health care provider if: You have trouble sleeping. You have muscle weakness. You have  significant weight loss without changing your eating habits. You feel nervous. You have trouble swallowing. You have increased swelling. You have a rapid or irregular heartbeat. Get help right away if: You have chest pain. You faint or lose consciousness. Your nodule makes it hard for you to breathe. These symptoms may be an emergency. Get help right away. Call 911. Do not wait to see if the symptoms will go away. Do not drive yourself to the hospital. Summary A thyroid nodule is an isolated growth of thyroid cells that forms a lump in your thyroid gland. Thyroid nodules are common. Most are not cancerous. Your health care provider may monitor the nodule to see if it goes away without treatment. If a nodule continues to grow, is cancerous, or does not go away, treatment may be needed. Treatment depends on the cause and size of your nodule or nodules. This information is not intended to replace advice given to you by your health care provider. Make sure you discuss any questions you have with your health care provider. Document Revised: 02/06/2021 Document Reviewed: 02/06/2021 Elsevier Patient Education  2024 [employer].

## 2024-01-06 ENCOUNTER — Encounter: Payer: Self-pay | Admitting: Internal Medicine

## 2024-01-06 DIAGNOSIS — I10 Essential (primary) hypertension: Secondary | ICD-10-CM | POA: Diagnosis not present

## 2024-01-06 DIAGNOSIS — R0602 Shortness of breath: Secondary | ICD-10-CM | POA: Diagnosis not present

## 2024-01-06 DIAGNOSIS — R600 Localized edema: Secondary | ICD-10-CM | POA: Diagnosis not present

## 2024-01-06 DIAGNOSIS — I519 Heart disease, unspecified: Secondary | ICD-10-CM | POA: Diagnosis not present

## 2024-01-07 ENCOUNTER — Encounter: Payer: Self-pay | Admitting: Nurse Practitioner

## 2024-01-07 ENCOUNTER — Ambulatory Visit (INDEPENDENT_AMBULATORY_CARE_PROVIDER_SITE_OTHER): Admitting: Nurse Practitioner

## 2024-01-07 VITALS — BP 131/75 | HR 67 | Temp 98.1°F | Resp 15 | Ht 67.01 in | Wt 231.2 lb

## 2024-01-07 DIAGNOSIS — B370 Candidal stomatitis: Secondary | ICD-10-CM

## 2024-01-07 DIAGNOSIS — R1311 Dysphagia, oral phase: Secondary | ICD-10-CM | POA: Diagnosis not present

## 2024-01-07 NOTE — Assessment & Plan Note (Signed)
 Ongoing, slightly better but not 100%.  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  She is scheduled for nodule biopsies tomorrow. Continue current medication regimen as ordered. She is aware to immediately return to ER if any worsening symptoms present.  Continue Fluconazole as ordered in ER setting. Will see if can get sooner GI visit.

## 2024-01-07 NOTE — Assessment & Plan Note (Signed)
Improved on exam today. Continue to monitor.

## 2024-01-07 NOTE — Progress Notes (Signed)
 Patient for US  guided FNA RT Mid & RT Inferior thyroid  nodule biopsies on Wed 01/08/24, I called and spoke with the patient on the phone and gave pre-procedure instructions. Pt was made aware to be here at 12:30p and check in at the [facility] registration desk. Pt stated understanding.  Called 01/01/24

## 2024-01-07 NOTE — Progress Notes (Signed)
 BP 131/75 (BP Location: Left Arm, Patient Position: Sitting, Cuff Size: Large)   Pulse 67   Temp 98.1 F (36.7 C) (Oral)   Resp 15   Ht 5' 7.01 (1.702 m)   Wt 231 lb 3.2 oz (104.9 kg)   SpO2 96%   BMI 36.20 kg/m    Subjective:    Patient ID: Melissa Merritt, female    DOB: 05/01/1950, 73 y.o.   MRN: [id number]  HPI: Melissa Merritt is a 73 y.o. female  Chief Complaint  Patient presents with   Dysphagia    A little better but not yet resolved.    DYSPHAGIA Saw ENT recently for visit, swallowing issues and thyroid  nodules. Goes for thyroid  biopsies tomorrow. She reports throat still is not right, with difficulty swallowing and dry mouth.  Is using Biotin, which is helping the dry mouth some. She does feel symptoms are improving some, but not 100%.  Scheduled to see GI on 05/18/2024.  Feels like hoarseness is worsening.  Duration: weeks Description of symptom: like she can not gulp Onset: Immediately upon swallowing Location of dysphagia: throat Dysphagia to solids only: yes Dysphagia to solids & liquids: yes  Frequency:intermittent  Progressively getting worse: no Alleviatiating factors: nothing Provoking factors: unknown Status: fluctuating EGD: yes Weight loss: no Sensation of lump in throat: no Heartburn: yes Odynophagia: no Nausea: no Vomiting: no Drooling/nasal regurgitation/food spillage: no Coughing/choking/dysphonia: no Dysarthria: no Hematemesis: no Regurgitation of undigested food/halitosis: no Chest pain: no   Relevant past medical, surgical, family and social history reviewed and updated as indicated. Interim medical history since our last visit reviewed. Allergies and medications reviewed and updated.  Review of Systems  Constitutional:  Positive for appetite change (due to swallowing issue). Negative for activity change, chills, fatigue and fever.  Respiratory:  Positive for cough (occasional) and shortness of breath (occasional). Negative for chest  tightness and wheezing.   Cardiovascular:  Negative for chest pain, palpitations and leg swelling.  Gastrointestinal: Negative.   Endocrine: Negative for cold intolerance and heat intolerance.  Neurological: Negative.   Psychiatric/Behavioral: Negative.      Per HPI unless specifically indicated above     Objective:    BP 131/75 (BP Location: Left Arm, Patient Position: Sitting, Cuff Size: Large)   Pulse 67   Temp 98.1 F (36.7 C) (Oral)   Resp 15   Ht 5' 7.01 (1.702 m)   Wt 231 lb 3.2 oz (104.9 kg)   SpO2 96%   BMI 36.20 kg/m   Wt Readings from Last 3 Encounters:  01/07/24 231 lb 3.2 oz (104.9 kg)  12/27/23 230 lb 12.8 oz (104.7 kg)  09/05/23 235 lb (106.6 kg)    Physical Exam Vitals and nursing note reviewed.  Constitutional:      General: She is awake. She is not in acute distress.    Appearance: She is well-developed and well-groomed. She is obese. She is not ill-appearing or toxic-appearing.  HENT:     Head: Normocephalic.     Right Ear: Hearing, tympanic membrane, ear canal and external ear normal.     Left Ear: Hearing, tympanic membrane, ear canal and external ear normal.     Nose: Nose normal.     Right Sinus: No maxillary sinus tenderness or frontal sinus tenderness.     Left Sinus: No maxillary sinus tenderness or frontal sinus tenderness.     Mouth/Throat:     Mouth: Mucous membranes are dry.     Pharynx: Posterior oropharyngeal  erythema (mild) present. No pharyngeal swelling or oropharyngeal exudate.     Comments: No white film noted on tongue today. Eyes:     General: Lids are normal.        Right eye: No discharge.        Left eye: No discharge.     Conjunctiva/sclera: Conjunctivae normal.     Pupils: Pupils are equal, round, and reactive to light.  Neck:     Thyroid : Thyromegaly (mild) present.     Vascular: No carotid bruit.  Cardiovascular:     Rate and Rhythm: Normal rate and regular rhythm.     Heart sounds: Normal heart sounds. No murmur  heard.    No gallop.  Pulmonary:     Effort: Pulmonary effort is normal. No accessory muscle usage or respiratory distress.     Breath sounds: Normal breath sounds. No decreased breath sounds, wheezing or rales.  Abdominal:     General: Bowel sounds are normal. There is no distension.     Palpations: Abdomen is soft.     Tenderness: There is no abdominal tenderness.  Musculoskeletal:     Cervical back: Normal range of motion and neck supple.     Right lower leg: No edema.     Left lower leg: No edema.  Lymphadenopathy:     Cervical: No cervical adenopathy.  Skin:    General: Skin is warm and dry.  Neurological:     Mental Status: She is alert and oriented to person, place, and time.     Cranial Nerves: Cranial nerves 2-12 are intact.     Coordination: Coordination is intact.     Gait: Gait is intact.     Deep Tendon Reflexes: Reflexes are normal and symmetric.     Reflex Scores:      Brachioradialis reflexes are 2+ on the right side and 2+ on the left side.      Patellar reflexes are 2+ on the right side and 2+ on the left side. Psychiatric:        Attention and Perception: Attention normal.        Mood and Affect: Mood normal.        Speech: Speech normal.        Behavior: Behavior normal. Behavior is cooperative.        Thought Content: Thought content normal.     Results for orders placed or performed in visit on 12/27/23  T4, free   Collection Time: 12/27/23  8:42 AM  Result Value Ref Range   Free T4 1.36 0.82 - 1.77 ng/dL  Thyroid  peroxidase antibody   Collection Time: 12/27/23  8:42 AM  Result Value Ref Range   Thyroperoxidase Ab SerPl-aCnc 26 0 - 34 IU/mL  TSH   Collection Time: 12/27/23  8:42 AM  Result Value Ref Range   TSH 1.550 0.450 - 4.500 uIU/mL  CBC with Differential/Platelet   Collection Time: 12/27/23  8:42 AM  Result Value Ref Range   WBC 10.1 3.4 - 10.8 x10E3/uL   RBC 4.25 3.77 - 5.28 x10E6/uL   Hemoglobin 13.1 11.1 - 15.9 g/dL   Hematocrit  60.3 65.9 - 46.6 %   MCV 93 79 - 97 fL   MCH 30.8 26.6 - 33.0 pg   MCHC 33.1 31.5 - 35.7 g/dL   RDW 87.2 88.2 - 84.5 %   Platelets 238 150 - 450 x10E3/uL   Neutrophils 76 Not Estab. %   Lymphs 10 Not Estab. %   Monocytes  10 Not Estab. %   Eos 2 Not Estab. %   Basos 1 Not Estab. %   Neutrophils Absolute 7.7 (H) 1.4 - 7.0 x10E3/uL   Lymphocytes Absolute 1.0 0.7 - 3.1 x10E3/uL   Monocytes Absolute 1.0 (H) 0.1 - 0.9 x10E3/uL   EOS (ABSOLUTE) 0.2 0.0 - 0.4 x10E3/uL   Basophils Absolute 0.1 0.0 - 0.2 x10E3/uL   Immature Granulocytes 1 Not Estab. %   Immature Grans (Abs) 0.1 0.0 - 0.1 x10E3/uL      Assessment & Plan:   Problem List Items Addressed This Visit       Digestive   Oral thrush   Improved on exam today. Continue to monitor.      Oral phase dysphagia - Primary   Ongoing, slightly better but not 100%.  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  She is scheduled for nodule biopsies tomorrow. Continue current medication regimen as ordered. She is aware to immediately return to ER if any worsening symptoms present.  Continue Fluconazole as ordered in ER setting. Will see if can get sooner GI visit.        Follow up plan: Return for as scheduled October 29th.

## 2024-01-08 ENCOUNTER — Other Ambulatory Visit: Payer: Self-pay | Admitting: Urology

## 2024-01-08 ENCOUNTER — Ambulatory Visit
Admission: RE | Admit: 2024-01-08 | Discharge: 2024-01-08 | Disposition: A | Discharge status: Home / Self Care | Source: Ambulatory Visit | Attending: Unknown Physician Specialty | Admitting: Unknown Physician Specialty

## 2024-01-08 VITALS — BP 126/61 | HR 63 | Resp 18

## 2024-01-08 DIAGNOSIS — E041 Nontoxic single thyroid nodule: Secondary | ICD-10-CM | POA: Diagnosis not present

## 2024-01-08 DIAGNOSIS — E042 Nontoxic multinodular goiter: Secondary | ICD-10-CM | POA: Diagnosis not present

## 2024-01-08 MED ORDER — LIDOCAINE HCL (PF) 1 % IJ SOLN
10.0000 mL | Freq: Once | INTRAMUSCULAR | Status: AC
Start: 2024-01-08 — End: 2024-01-08
  Administered 2024-01-08: 10 mL via INTRADERMAL

## 2024-01-08 NOTE — Procedures (Signed)
 PROCEDURE SUMMARY:  Using direct ultrasound guidance, 5 passes were made using 25 g needles into each of the 2 nodules within the right lobe of the thyroid .   Ultrasound was used to confirm needle placements on all occasions.   EBL = trace  Specimens were sent to Pathology for analysis.  See procedure note under Imaging tab in Epic for full procedure details.    Electronically Signed: Carlin DELENA Griffon, PA-C 01/08/2024, 2:25 PM

## 2024-01-08 NOTE — Discharge Instructions (Signed)
 Reviewed with patient

## 2024-01-09 LAB — CYTOLOGY - NON PAP

## 2024-01-13 ENCOUNTER — Ambulatory Visit: Payer: Self-pay

## 2024-01-13 NOTE — Telephone Encounter (Signed)
 If provider would prefer to try rx and cancel appt, please contact patient. Scheduled for tomorrow with another provider in PCP office.   FYI Only or Action Required?: Action required by provider: clinical question for provider.  Patient was last seen in primary care on 01/07/2024 by Valerio Melanie DASEN, NP.  Called Nurse Triage reporting Thrush.  Symptoms began several days ago.  Interventions attempted: Nothing.  Symptoms are: unchanged.  Triage Disposition: See PCP When Office is Open (Within 3 Days)  Patient/caregiver understands and will follow disposition?: Yes  Reason for Disposition  [1] White patches that stick to tongue or inner cheek AND [2] can be wiped off  Answer Assessment - Initial Assessment Questions 1. SYMPTOM: What's the main symptom you're concerned about? (e.g., chapped lips, dry mouth, lump, sores)     Patient reports return of thrush, was seen for same on 01/07/24. Completed diflucan on Oct 1.  2. ONSET:     3 days  3. PAIN: Is there any pain? If Yes, ask: How bad is it? (Scale: 0-10; or none, mild, moderate, severe)     Denies  4. CAUSE: What do you think is causing the symptoms?     Thrush  5. OTHER SYMPTOMS: Do you have any other symptoms? (e.g., fever, sore throat, toothache, swelling)     Denies  Protocols used: Mouth Symptoms-A-AH Message from [facility] sent at 01/13/2024  8:28 AM EDT  Patient stated she was initially treated for Oral Thrush treated initial @ the Ed on 12/25/2023 and followed up with PCP 12/27/2023 & seen once more on 01/07/2024 for same issues - Patient ended the initial dosage of Rx - Diflucan (Fluconazole) tablet) Oct. 1, 2025  But patient stated she feels as though the Oral Celestino has returned and is inquiring if a new dosage can be administered.

## 2024-01-13 NOTE — Telephone Encounter (Signed)
 Routing to PCP and scheduled provider for assistance.

## 2024-01-14 ENCOUNTER — Encounter: Payer: Self-pay | Admitting: Nurse Practitioner

## 2024-01-14 ENCOUNTER — Ambulatory Visit (INDEPENDENT_AMBULATORY_CARE_PROVIDER_SITE_OTHER): Admitting: Nurse Practitioner

## 2024-01-14 VITALS — BP 130/70 | HR 76 | Temp 98.4°F | Ht 67.0 in | Wt 231.8 lb

## 2024-01-14 DIAGNOSIS — B370 Candidal stomatitis: Secondary | ICD-10-CM

## 2024-01-14 MED ORDER — NYSTATIN 100000 UNIT/ML MT SUSP: 5.0000 mL | Freq: Four times a day (QID) | OROMUCOSAL | 0 refills | Status: AC

## 2024-01-14 NOTE — Telephone Encounter (Signed)
 Noted

## 2024-01-14 NOTE — Progress Notes (Signed)
 BP 130/70   Pulse 76   Temp 98.4 F (36.9 C) (Oral)   Ht 5' 7 (1.702 m)   Wt 105.1 kg   SpO2 92%   BMI 36.31 kg/m    Subjective:    Patient ID: Melissa Merritt, female    DOB: 1950/08/08, 73 y.o.   MRN: [id number]  NOTE WRITTEN BY DNP STUDENT.  ASSESSMENT AND PLAN OF CARE REVIEWED WITH STUDENT, AGREE WITH ABOVE FINDINGS AND PLAN.  Chief Complaint  Patient presents with   Thrush    Patient states that she has reoccurring thrush, has taken Diflucan that helped but states the thrush comes back     HPI: Melissa Merritt is a 73 y.o. female is here for an acute visit for thrush. Seen in ER initially prescribed diflucan, finished on Oct. 1. Symptoms had resolved but states they started back up Oct 4. Pt denies fevers. Reports white film, thickness in throat and discomfort. Pt reports using trelegy inhaler daily, but states she swishes her mouth with water after each use. Pt has been on trelegy for a long time and previously had occasional thrush episodes in the past that resolved with nystatin  liquid.   Relevant past medical, surgical, family and social history reviewed and updated as indicated. Interim medical history since our last visit reviewed. Allergies and medications reviewed and updated.  Review of Systems  Constitutional:  Negative for chills, fatigue and fever.  HENT:  Positive for sore throat. Negative for mouth sores, postnasal drip, rhinorrhea, trouble swallowing and voice change.        White filmy patches on tongue  Eyes:  Negative for discharge and redness.  Respiratory:  Negative for choking, chest tightness and shortness of breath.   Cardiovascular:  Negative for chest pain and palpitations.  Gastrointestinal:  Negative for abdominal pain, nausea and vomiting.  Neurological:  Negative for light-headedness and headaches.    Per HPI unless specifically indicated above     Objective:    BP 130/70   Pulse 76   Temp 98.4 F (36.9 C) (Oral)   Ht 5' 7 (1.702  m)   Wt 105.1 kg   SpO2 92%   BMI 36.31 kg/m   Wt Readings from Last 3 Encounters:  01/14/24 105.1 kg  01/07/24 104.9 kg  12/27/23 104.7 kg    Physical Exam Vitals and nursing note reviewed.  Constitutional:      General: She is not in acute distress.    Appearance: She is not ill-appearing, toxic-appearing or diaphoretic.  HENT:     Head: Normocephalic.     Right Ear: External ear normal.     Left Ear: External ear normal.     Nose: Nose normal. No congestion or rhinorrhea.     Mouth/Throat:     Mouth: Mucous membranes are dry.     Pharynx: Posterior oropharyngeal erythema present. No oropharyngeal exudate.   Eyes:     General:        Right eye: No discharge.        Left eye: No discharge.     Extraocular Movements: Extraocular movements intact.     Conjunctiva/sclera: Conjunctivae normal.     Pupils: Pupils are equal, round, and reactive to light.  Cardiovascular:     Rate and Rhythm: Normal rate and regular rhythm.     Pulses: Normal pulses.     Heart sounds: Normal heart sounds. No murmur heard. Pulmonary:     Effort: Pulmonary effort is normal. No  respiratory distress.     Breath sounds: Normal breath sounds. No wheezing, rhonchi or rales.  Abdominal:     General: Abdomen is flat. Bowel sounds are normal.     Palpations: Abdomen is soft.  Musculoskeletal:     Cervical back: Normal range of motion and neck supple.  Skin:    General: Skin is warm and dry.     Capillary Refill: Capillary refill takes less than 2 seconds.  Neurological:     General: No focal deficit present.     Mental Status: She is oriented to person, place, and time.  Psychiatric:        Mood and Affect: Mood normal.        Behavior: Behavior normal.        Thought Content: Thought content normal.        Judgment: Judgment normal.     Results for orders placed or performed in visit on 01/08/24  Cytology - Non PAP;   Collection Time: 01/08/24 12:00 AM  Result Value Ref Range   CYTOLOGY  - NON GYN      CYTOLOGY - NON GYN [facility] [address] [place], [place] [address] Telephone [phone] or [phone] Fax [phone]  CYTOPATHOLOGY REPORT   Accession #: [id number] Patient Name: LANIKA, COLGATE Visit # :   MRN: [id number] Physician: Birda Dunnings DOB/Age 10/21/1950 (Age: 36) Gender: F Collected Date: 01/08/2024 Received Date: 01/08/2024  FINAL DIAGNOSIS STATEMENT Of SPECIMEN ADEQUACY:  INTERPRETATION(S):      BENIGN FOLLICULAR NODULE (BETHESDA CATEGORY II).      BENIGN FOLLICULAR NODULE (BETHESDA CATEGORY II).      ELECTRONIC SIGNATURE : Belvie Come, John, Pathologist, Electronic Signature   CASE COMMENTS   CLINICAL HISTORY  SOURCE OF SPECIMEN(S) Fine Needle Aspiration Fine Needle Aspiration  SPECIMEN COMMENTS: SPECIMEN CLINICAL INFORMATION:    Gross Description Specimen: Received is/are 12cc of clear fluid in a sterile vial      Prepared:      # Smears: 0      # Concentration Technique Slides  (i.e. ThinPrep): 1      # Cell Block: 0      # Diff-Quick Stain: 0      Thyroseq testing will be preformed if clinically indicated Specimen: Received is/are 12cc of pink fluid in a sterile vial      Prepared:      # Smears: 0      # Concentration Technique Slides (i.e. ThinPrep): 1      # Cell Block: 0      # Diff-Quick Stain: 0      Thyroseq testing will be preformed if clinically indicated        Report signed out from the following location(s) [place]. [facility] [address] [place], [place] [address] CLIA #: [id number]  [facility] [address] [place], [place] [address] CLIA #: [id number]       Assessment & Plan:   Problem List Items Addressed This Visit       Digestive   Oral thrush - Primary   Thrush present on exam today. Previously finished diflucan on Oct 1. Nystatin  liquid ordered. Pt advised to continue oral rinses after inhaler usage.  Follow-up as needed if symptoms worsen or do not resolve.         Follow up plan: No follow-ups on file.

## 2024-01-14 NOTE — Assessment & Plan Note (Addendum)
 Thrush present on exam today. Previously finished diflucan on Oct 1. Nystatin  liquid ordered. Pt advised to continue oral rinses after inhaler usage. Follow-up as needed if symptoms worsen or do not resolve.

## 2024-01-17 ENCOUNTER — Other Ambulatory Visit: Payer: Self-pay | Admitting: Nurse Practitioner

## 2024-01-17 DIAGNOSIS — Z961 Presence of intraocular lens: Secondary | ICD-10-CM | POA: Diagnosis not present

## 2024-01-17 DIAGNOSIS — H43813 Vitreous degeneration, bilateral: Secondary | ICD-10-CM | POA: Diagnosis not present

## 2024-01-17 DIAGNOSIS — H47323 Drusen of optic disc, bilateral: Secondary | ICD-10-CM | POA: Diagnosis not present

## 2024-01-17 DIAGNOSIS — H02839 Dermatochalasis of unspecified eye, unspecified eyelid: Secondary | ICD-10-CM | POA: Diagnosis not present

## 2024-01-17 DIAGNOSIS — H5213 Myopia, bilateral: Secondary | ICD-10-CM | POA: Diagnosis not present

## 2024-01-17 DIAGNOSIS — H52223 Regular astigmatism, bilateral: Secondary | ICD-10-CM | POA: Diagnosis not present

## 2024-01-17 DIAGNOSIS — H524 Presbyopia: Secondary | ICD-10-CM | POA: Diagnosis not present

## 2024-01-20 NOTE — Telephone Encounter (Signed)
 Requested Prescriptions  Refused Prescriptions Disp Refills   amLODipine  (NORVASC ) 5 MG tablet [Pharmacy Med Name: AMLODIPINE  BESYLATE 5 MG TAB] 90 tablet 0    Sig: Take 1 tablet (5 mg total) by mouth daily.     Cardiovascular: Calcium Channel Blockers 2 Passed - 01/20/2024  4:24 PM      Passed - Last BP in normal range    BP Readings from Last 1 Encounters:  01/14/24 130/70         Passed - Last Heart Rate in normal range    Pulse Readings from Last 1 Encounters:  01/14/24 76         Passed - Valid encounter within last 6 months    Recent Outpatient Visits           6 days ago Oral thrush   [place] [facility] Melvin Pao, NP   1 week ago Oral phase dysphagia   [place], Melanie DASEN, NP   3 weeks ago Oral phase dysphagia   [place] [facility] [place], Melanie T, NP   4 months ago Benign neoplasm of right lung   [place] [facility] [place], Melanie T, NP   5 months ago Carcinoma of overlapping sites of right breast in female, estrogen receptor positive (HCC)   [place] [facility] [place], Melanie DASEN, NP

## 2024-02-02 NOTE — Patient Instructions (Signed)
 Be Involved in Caring For Your Health:  Taking Medications When medications are taken as directed, they can greatly improve your health. But if they are not taken as prescribed, they may not work. In some cases, not taking them correctly can be harmful. To help ensure your treatment remains effective and safe, understand your medications and how to take them. Bring your medications to each visit for review by your provider.  Your lab results, notes, and after visit summary will be available on [name]. We strongly encourage you to use this feature. If lab results are abnormal the clinic will contact you with the appropriate steps. If the clinic does not contact you assume the results are satisfactory. You can always view your results on [name]. If you have questions regarding your health or results, please contact the clinic during office hours. You can also ask questions on [name].  We at [facility] are grateful that you chose [name] to provide your care. We strive to provide evidence-based and compassionate care and are always looking for feedback. If you get a survey from the clinic please complete this so we can hear your opinions.  Heart-Healthy Eating Plan Many factors influence your heart health, including eating and exercise habits. Heart health is also called coronary health. Coronary risk increases with abnormal blood fat (lipid) levels. A heart-healthy eating plan includes limiting unhealthy fats, increasing healthy fats, limiting salt (sodium) intake, and making other diet and lifestyle changes. What is my plan? Your health care provider may recommend that: You limit your fat intake to _________% or less of your total calories each day. You limit your saturated fat intake to _________% or less of your total calories each day. You limit the amount of cholesterol in your diet to less than _________ mg per day. You limit the amount of sodium in your diet to less than _________  mg per day. What are tips for following this plan? Cooking Cook foods using methods other than frying. Baking, boiling, grilling, and broiling are all good options. Other ways to reduce fat include: Removing the skin from poultry. Removing all visible fats from meats. Steaming vegetables in water or broth. Meal planning  At meals, imagine dividing your plate into fourths: Fill one-half of your plate with vegetables and green salads. Fill one-fourth of your plate with whole grains. Fill one-fourth of your plate with lean protein foods. Eat 2-4 cups of vegetables per day. One cup of vegetables equals 1 cup (91 g) broccoli or cauliflower florets, 2 medium carrots, 1 large bell pepper, 1 large sweet potato, 1 large tomato, 1 medium white potato, 2 cups (150 g) raw leafy greens. Eat 1-2 cups of fruit per day. One cup of fruit equals 1 small apple, 1 large banana, 1 cup (237 g) mixed fruit, 1 large orange,  cup (82 g) dried fruit, 1 cup (240 mL) 100% fruit juice. Eat more foods that contain soluble fiber. Examples include apples, broccoli, carrots, beans, peas, and barley. Aim to get 25-30 g of fiber per day. Increase your consumption of legumes, nuts, and seeds to 4-5 servings per week. One serving of dried beans or legumes equals  cup (90 g) cooked, 1 serving of nuts is  oz (12 almonds, 24 pistachios, or 7 walnut halves), and 1 serving of seeds equals  oz (8 g). Fats Choose healthy fats more often. Choose monounsaturated and polyunsaturated fats, such as olive and canola oils, avocado oil, flaxseeds, walnuts, almonds, and seeds. Eat  more omega-3 fats. Choose salmon, mackerel, sardines, tuna, flaxseed oil, and ground flaxseeds. Aim to eat fish at least 2 times each week. Check food labels carefully to identify foods with trans fats or high amounts of saturated fat. Limit saturated fats. These are found in animal products, such as meats, butter, and cream. Plant sources of saturated fats  include palm oil, palm kernel oil, and coconut oil. Avoid foods with partially hydrogenated oils in them. These contain trans fats. Examples are stick margarine, some tub margarines, cookies, crackers, and other baked goods. Avoid fried foods. General information Eat more home-cooked food and less restaurant, buffet, and fast food. Limit or avoid alcohol. Limit foods that are high in added sugar and simple starches such as foods made using white refined flour (white breads, pastries, sweets). Lose weight if you are overweight. Losing just 5-10% of your body weight can help your overall health and prevent diseases such as diabetes and heart disease. Monitor your sodium intake, especially if you have high blood pressure. Talk with your health care provider about your sodium intake. Try to incorporate more vegetarian meals weekly. What foods should I eat? Fruits All fresh, canned (in natural juice), or frozen fruits. Vegetables Fresh or frozen vegetables (raw, steamed, roasted, or grilled). Green salads. Grains Most grains. Choose whole wheat and whole grains most of the time. Rice and pasta, including brown rice and pastas made with whole wheat. Meats and other proteins Lean, well-trimmed beef, veal, pork, and lamb. Chicken and [place] without skin. All fish and shellfish. Wild duck, rabbit, pheasant, and venison. Egg whites or low-cholesterol egg substitutes. Dried beans, peas, lentils, and tofu. Seeds and most nuts. Dairy Low-fat or nonfat cheeses, including ricotta and mozzarella. Skim or 1% milk (liquid, powdered, or evaporated). Buttermilk made with low-fat milk. Nonfat or low-fat yogurt. Fats and oils Non-hydrogenated (trans-free) margarines. Vegetable oils, including soybean, sesame, sunflower, olive, avocado, peanut, safflower, corn, canola, and cottonseed. Salad dressings or mayonnaise made with a vegetable oil. Beverages Water (mineral or sparkling). Coffee and tea. Unsweetened ice  tea. Diet beverages. Sweets and desserts Sherbet, gelatin, and fruit ice. Small amounts of dark chocolate. Limit all sweets and desserts. Seasonings and condiments All seasonings and condiments. The items listed above may not be a complete list of foods and beverages you can eat. Contact a dietitian for more options. What foods should I avoid? Fruits Canned fruit in heavy syrup. Fruit in cream or butter sauce. Fried fruit. Limit coconut. Vegetables Vegetables cooked in cheese, cream, or butter sauce. Fried vegetables. Grains Breads made with saturated or trans fats, oils, or whole milk. Croissants. Sweet rolls. Donuts. High-fat crackers, such as cheese crackers and chips. Meats and other proteins Fatty meats, such as hot dogs, ribs, sausage, bacon, rib-eye roast or steak. High-fat deli meats, such as salami and bologna. Caviar. Domestic duck and goose. Organ meats, such as liver. Dairy Cream, sour cream, cream cheese, and creamed cottage cheese. Whole-milk cheeses. Whole or 2% milk (liquid, evaporated, or condensed). Whole buttermilk. Cream sauce or high-fat cheese sauce. Whole-milk yogurt. Fats and oils Meat fat, or shortening. Cocoa butter, hydrogenated oils, palm oil, coconut oil, palm kernel oil. Solid fats and shortenings, including bacon fat, salt pork, lard, and butter. Nondairy cream substitutes. Salad dressings with cheese or sour cream. Beverages Regular sodas and any drinks with added sugar. Sweets and desserts Frosting. Pudding. Cookies. Cakes. Pies. Milk chocolate or white chocolate. Buttered syrups. Full-fat ice cream or ice cream drinks. The items listed above may  not be a complete list of foods and beverages to avoid. Contact a dietitian for more information. Summary Heart-healthy meal planning includes limiting unhealthy fats, increasing healthy fats, limiting salt (sodium) intake and making other diet and lifestyle changes. Lose weight if you are overweight. Losing just  5-10% of your body weight can help your overall health and prevent diseases such as diabetes and heart disease. Focus on eating a balance of foods, including fruits and vegetables, low-fat or nonfat dairy, lean protein, nuts and legumes, whole grains, and heart-healthy oils and fats. This information is not intended to replace advice given to you by your health care provider. Make sure you discuss any questions you have with your health care provider. Document Revised: 05/01/2021 Document Reviewed: 05/01/2021 Elsevier Patient Education  2024 [employer].

## 2024-02-04 ENCOUNTER — Encounter: Payer: Self-pay | Admitting: Internal Medicine

## 2024-02-05 ENCOUNTER — Ambulatory Visit (INDEPENDENT_AMBULATORY_CARE_PROVIDER_SITE_OTHER): Admitting: Nurse Practitioner

## 2024-02-05 ENCOUNTER — Encounter: Payer: Self-pay | Admitting: Nurse Practitioner

## 2024-02-05 VITALS — BP 128/68 | HR 80 | Temp 98.2°F | Resp 15 | Ht 67.01 in | Wt 231.8 lb

## 2024-02-05 DIAGNOSIS — C50811 Malignant neoplasm of overlapping sites of right female breast: Secondary | ICD-10-CM

## 2024-02-05 DIAGNOSIS — E042 Nontoxic multinodular goiter: Secondary | ICD-10-CM

## 2024-02-05 DIAGNOSIS — Z170 Estrogen receptor positive status [ER+]: Secondary | ICD-10-CM

## 2024-02-05 DIAGNOSIS — E782 Mixed hyperlipidemia: Secondary | ICD-10-CM

## 2024-02-05 DIAGNOSIS — R7301 Impaired fasting glucose: Secondary | ICD-10-CM

## 2024-02-05 DIAGNOSIS — Z79811 Long term (current) use of aromatase inhibitors: Secondary | ICD-10-CM | POA: Diagnosis not present

## 2024-02-05 DIAGNOSIS — E66812 Obesity, class 2: Secondary | ICD-10-CM | POA: Diagnosis not present

## 2024-02-05 DIAGNOSIS — Z6835 Body mass index (BMI) 35.0-35.9, adult: Secondary | ICD-10-CM

## 2024-02-05 DIAGNOSIS — J4489 Other specified chronic obstructive pulmonary disease: Secondary | ICD-10-CM

## 2024-02-05 DIAGNOSIS — G4733 Obstructive sleep apnea (adult) (pediatric): Secondary | ICD-10-CM

## 2024-02-05 DIAGNOSIS — I10 Essential (primary) hypertension: Secondary | ICD-10-CM

## 2024-02-05 DIAGNOSIS — D508 Other iron deficiency anemias: Secondary | ICD-10-CM

## 2024-02-05 LAB — BAYER DCA HB A1C WAIVED: HB A1C (BAYER DCA - WAIVED): 6 % — ABNORMAL HIGH (ref 4.8–5.6)

## 2024-02-05 NOTE — Assessment & Plan Note (Signed)
 Had benign biopsies on 01/13/24.  Will plan on repeat imaging as recommended on 12/26/24.

## 2024-02-05 NOTE — Assessment & Plan Note (Signed)
Followed by oncology, has 2 years left to continue taking.

## 2024-02-05 NOTE — Assessment & Plan Note (Signed)
Ongoing, continue collaboration with oncology.  Recent notes reviewed. 

## 2024-02-05 NOTE — Assessment & Plan Note (Signed)
 Chronic, ongoing. Patient continues to refuse statin, does not want to take. Continues red yeast rice daily. Labs: Lipid panel.

## 2024-02-05 NOTE — Assessment & Plan Note (Signed)
 Chronic, stable with Trelegy.  Continue collaboration with pulmonary and current medications as prescribed by them.  Recent note reviewed.  Has had some episodes of oral thrush, she is aware to rinse mouth well after inhaler use.

## 2024-02-05 NOTE — Assessment & Plan Note (Signed)
 A1c remains 6% today.  Continue focus on diet and regular activity.  Has stopped drinking sodas.

## 2024-02-05 NOTE — Assessment & Plan Note (Signed)
BMI 36.30.  Recommended eating smaller high protein, low fat meals more frequently and exercising 30 mins a day 5 times a week with a goal of 10-15lb weight loss in the next 3 months. Patient voiced their understanding and motivation to adhere to these recommendations.

## 2024-02-05 NOTE — Assessment & Plan Note (Signed)
 Chronic, stable.  BP stable on recheck today.  Continue Valsartan  max dosing 320 MG and continue current Spironolactone, Metoprolol  + Amlodipine  at 5 MG, no swelling with this dose but had swelling with 10 MG. Take Lasix as needed per cardiology.  Monitor K+ level closely.  Continue to monitor BP at home regularly and document + focus on DASH diet.  LABS: CMP. Had trend up in BP with Aromasin  on board.

## 2024-02-05 NOTE — Progress Notes (Signed)
 BP 128/68 (BP Location: Left Arm, Patient Position: Sitting)   Pulse 80   Temp 98.2 F (36.8 C) (Oral)   Resp 15   Ht 5' 7.01 (1.702 m)   Wt 231 lb 12.8 oz (105.1 kg)   SpO2 97%   BMI 36.30 kg/m    Subjective:    Patient ID: Melissa Merritt, female    DOB: [birth year], 73 y.o.   MRN: [id number]  HPI: Melissa Merritt is a 73 y.o. female.   Chief Complaint  Patient presents with   HTN/HLD    About normal around 132-156/110    COPD    It's ok about back to normal before the same as before when she had the throat issues.    HYPERTENSION / HYPERLIPIDEMIA Taking Valsartan , Metoprolol , Amlodipine , Spironolactone. Saw cardiology last on 01/06/24, was given Lasix to take as needed. Echo 06/15/22 with LV function LVEF 45-50% with normal LVH which is consistent with previous echos, going back to 2013.  Continues to refuse statin therapy, even after at length discussions about risk reduction with PCP and cardiology.  Continues red yeast rice. Satisfied with current treatment? yes Duration of hypertension: chronic BP monitoring frequency: daily BP medication side effects: no Duration of hyperlipidemia: chronic Cholesterol medication side effects: no Cholesterol supplements: red yeast rice Medication compliance: good compliance Aspirin: no Recent stressors: yes Recurrent headaches: no Visual changes: no Palpitations: no Dyspnea: at baseline, no worsening Chest pain: no Lower extremity edema: no Dizzy/lightheaded: no   COPD (Stage 2) Uses Trelegy and Albuterol . Completed pulmonary rehab in December 2022. Has OSA and uses CPAP. Saw pulmonary on 10/16/23. COPD status: stable Satisfied with current treatment?: no Oxygen use: no Limitation of activity: no Pneumovax: Up to Date Influenza: Up to Date   Impaired Fasting Glucose HbA1C:  Lab Results  Component Value Date   HGBA1C 6.0 (H) 07/29/2023  Polydipsia: no Polyuria: no Weight change: no Visual disturbance: no Glucose  Monitoring: no    Accucheck frequency: TID Diabetic Education: Completed Family history of diabetes: no   OSTEOPENIA & BREAST CA Oncology 08/19/23 and returns in November.  Obtains Prolia  injections with oncology.  Scheduled for DEXA and mammogram 07/31/23.  Follows with oncology due to breast cancer, continues on Aromasin  daily.  Satisfied with current treatment?: yes Adequate calcium & vitamin D : yes Intolerance to bisphosphonates:no Weight bearing exercises: no      12/27/2023    8:09 AM 07/29/2023    9:46 AM 06/18/2023   10:08 AM 01/28/2023    9:24 AM 08/28/2022   11:04 AM  Depression screen PHQ 2/9  Decreased Interest 0 0 0 0 0  Down, Depressed, Hopeless 0 1 0 1 0  PHQ - 2 Score 0 1 0 1 0  Altered sleeping 1 3 3 2 2   Tired, decreased energy 1 1 2 1  0  Change in appetite 0 0 0 0 0  Feeling bad or failure about yourself  0 0 1 0 0  Trouble concentrating 0 0 0 0 0  Moving slowly or fidgety/restless 0 0 0 0 0  Suicidal thoughts 0 0 0 0 0  PHQ-9 Score 2 5 6 4 2   Difficult doing work/chores Somewhat difficult Somewhat difficult Somewhat difficult Not difficult at all Not difficult at all       12/27/2023    8:09 AM 07/29/2023    9:46 AM 01/28/2023    9:24 AM 08/28/2022   11:04 AM  GAD 7 : Generalized Anxiety Score  Nervous, Anxious, on Edge 1 1 1  0  Control/stop worrying 1 1 1 1   Worry too much - different things 1 1 1  0  Trouble relaxing 0 1 1 0  Restless 0 0 1 0  Easily annoyed or irritable 0 0 0 0  Afraid - awful might happen 1 0 1 0  Total GAD 7 Score 4 4 6 1   Anxiety Difficulty Somewhat difficult Somewhat difficult Not difficult at all Not difficult at all   Relevant past medical, surgical, family and social history reviewed and updated as indicated. Interim medical history since our last visit reviewed. Allergies and medications reviewed and updated.  Review of Systems  Constitutional:  Negative for activity change, appetite change, chills, fatigue and fever.   Respiratory:  Positive for cough (occasional) and shortness of breath (occasional). Negative for chest tightness and wheezing.   Cardiovascular:  Negative for chest pain, palpitations and leg swelling.  Gastrointestinal: Negative.   Endocrine: Negative for cold intolerance, heat intolerance, polydipsia, polyphagia and polyuria.  Neurological: Negative.   Psychiatric/Behavioral: Negative.     Per HPI unless specifically indicated above     Objective:    BP 128/68 (BP Location: Left Arm, Patient Position: Sitting)   Pulse 80   Temp 98.2 F (36.8 C) (Oral)   Resp 15   Ht 5' 7.01 (1.702 m)   Wt 231 lb 12.8 oz (105.1 kg)   SpO2 97%   BMI 36.30 kg/m   Wt Readings from Last 3 Encounters:  02/05/24 231 lb 12.8 oz (105.1 kg)  01/14/24 231 lb 12.8 oz (105.1 kg)  01/07/24 231 lb 3.2 oz (104.9 kg)    Physical Exam Vitals and nursing note reviewed.  Constitutional:      General: She is awake. She is not in acute distress.    Appearance: She is well-developed and well-groomed. She is obese. She is not ill-appearing or toxic-appearing.  HENT:     Head: Normocephalic.     Right Ear: Hearing and external ear normal.     Left Ear: Hearing and external ear normal.  Eyes:     General: Lids are normal.        Right eye: No discharge.        Left eye: No discharge.     Conjunctiva/sclera: Conjunctivae normal.     Pupils: Pupils are equal, round, and reactive to light.  Neck:     Thyroid : No thyromegaly.     Vascular: No carotid bruit.  Cardiovascular:     Rate and Rhythm: Normal rate and regular rhythm.     Heart sounds: Normal heart sounds. No murmur heard.    No gallop.  Pulmonary:     Effort: Pulmonary effort is normal. No accessory muscle usage or respiratory distress.     Breath sounds: Normal breath sounds.  Abdominal:     General: Bowel sounds are normal. There is no distension.     Palpations: Abdomen is soft.     Tenderness: There is no abdominal tenderness.   Musculoskeletal:     Cervical back: Normal range of motion and neck supple.     Right lower leg: No edema.     Left lower leg: No edema.  Lymphadenopathy:     Cervical: No cervical adenopathy.  Skin:    General: Skin is warm and dry.  Neurological:     Mental Status: She is alert and oriented to person, place, and time.     Deep Tendon Reflexes: Reflexes are normal  and symmetric.     Reflex Scores:      Brachioradialis reflexes are 2+ on the right side and 2+ on the left side.      Patellar reflexes are 2+ on the right side and 2+ on the left side. Psychiatric:        Attention and Perception: Attention normal.        Mood and Affect: Mood normal.        Speech: Speech normal.        Behavior: Behavior normal. Behavior is cooperative.        Thought Content: Thought content normal.    Results for orders placed or performed in visit on 01/08/24  Cytology - Non PAP;   Collection Time: 01/08/24 12:00 AM  Result Value Ref Range   CYTOLOGY - NON GYN      CYTOLOGY - NON GYN [facility] [address], [address] [place], [place] [address] Telephone [phone] or [phone] Fax [phone]  CYTOPATHOLOGY REPORT   Accession #: [id number] Patient Name: Melissa Merritt, Melissa Merritt Visit # :   MRN: [id number] Physician: Birda Dunnings DOB/Age 07-16-1950 (Age: 70) Gender: F Collected Date: 01/08/2024 Received Date: 01/08/2024  FINAL DIAGNOSIS STATEMENT Of SPECIMEN ADEQUACY:  INTERPRETATION(S):      BENIGN FOLLICULAR NODULE (BETHESDA CATEGORY II).      BENIGN FOLLICULAR NODULE (BETHESDA CATEGORY II).      ELECTRONIC SIGNATURE : Belvie Come, John, Pathologist, Electronic Signature   CASE COMMENTS   CLINICAL HISTORY  SOURCE OF SPECIMEN(S) Fine Needle Aspiration Fine Needle Aspiration  SPECIMEN COMMENTS: SPECIMEN CLINICAL INFORMATION:    Gross Description Specimen: Received is/are 12cc of clear fluid in a sterile vial      Prepared:      #  Smears: 0      # Concentration Technique Slides  (i.e. ThinPrep): 1      # Cell Block: 0      # Diff-Quick Stain: 0      Thyroseq testing will be preformed if clinically indicated Specimen: Received is/are 12cc of pink fluid in a sterile vial      Prepared:      # Smears: 0      # Concentration Technique Slides (i.e. ThinPrep): 1      # Cell Block: 0      # Diff-Quick Stain: 0      Thyroseq testing will be preformed if clinically indicated        Report signed out from the following location(s) [place] [address] [place], [place] [address] CLIA #: [id number]  [facility] [address] [place], [place] [address] CLIA #: [id number]       Assessment & Plan:   Problem List Items Addressed This Visit       Cardiovascular and Mediastinum   Essential hypertension   Chronic, stable.  BP stable on recheck today.  Continue Valsartan  max dosing 320 MG and continue current Spironolactone, Metoprolol  + Amlodipine  at 5 MG, no swelling with this dose but had swelling with 10 MG. Take Lasix as needed per cardiology.  Monitor K+ level closely.  Continue to monitor BP at home regularly and document + focus on DASH diet.  LABS: CMP. Had trend up in BP with Aromasin  on board.      Relevant Orders   Comprehensive metabolic panel with GFR     Respiratory   Obstructive apnea   Chronic, ongoing.  Recommend she use her  CPAP 100% of the time.      Asthma-COPD overlap syndrome (HCC) - Primary   Chronic, stable with Trelegy.  Continue collaboration with pulmonary and current medications as prescribed by them.  Recent note reviewed.  Has had some episodes of oral thrush, she is aware to rinse mouth well after inhaler use.        Endocrine   Multinodular thyroid    Had benign biopsies on 01/13/24.  Will plan on repeat imaging as recommended on 12/26/24.      IFG (impaired fasting glucose)   A1c remains 6% today.  Continue focus on diet and regular  activity.  Has stopped drinking sodas.      Relevant Orders   Bayer DCA Hb A1c Waived     Other   Obesity   BMI 36.30.  Recommended eating smaller high protein, low fat meals more frequently and exercising 30 mins a day 5 times a week with a goal of 10-15lb weight loss in the next 3 months. Patient voiced their understanding and motivation to adhere to these recommendations.       Long term (current) use of aromatase inhibitors   Followed by oncology, has 2 years left to continue taking.      Hyperlipidemia   Chronic, ongoing. Patient continues to refuse statin, does not want to take. Continues red yeast rice daily. Labs: Lipid panel.       Relevant Orders   Comprehensive metabolic panel with GFR   Lipid Panel w/o Chol/HDL Ratio   Carcinoma of overlapping sites of right breast in female, estrogen receptor positive (HCC)   Ongoing, continue collaboration with oncology.  Recent notes reviewed.        Follow up plan: Return in about 4 months (around 06/06/2024) for HTN/HLD, ASTHMA, BREAST CA, OSTEOPENIA, IFG.

## 2024-02-05 NOTE — Assessment & Plan Note (Addendum)
 Chronic, ongoing.  Recommend she use her CPAP 100% of the time.

## 2024-02-06 ENCOUNTER — Ambulatory Visit: Payer: Self-pay | Admitting: Nurse Practitioner

## 2024-02-06 LAB — COMPREHENSIVE METABOLIC PANEL WITH GFR
ALT: 21 IU/L (ref 0–32)
AST: 19 IU/L (ref 0–40)
Albumin: 4.5 g/dL (ref 3.8–4.8)
Alkaline Phosphatase: 76 IU/L (ref 49–135)
BUN/Creatinine Ratio: 19 (ref 12–28)
BUN: 21 mg/dL (ref 8–27)
Bilirubin Total: 0.4 mg/dL (ref 0.0–1.2)
CO2: 26 mmol/L (ref 20–29)
Calcium: 10.4 mg/dL — ABNORMAL HIGH (ref 8.7–10.3)
Chloride: 101 mmol/L (ref 96–106)
Creatinine, Ser: 1.1 mg/dL — ABNORMAL HIGH (ref 0.57–1.00)
Globulin, Total: 2.3 g/dL (ref 1.5–4.5)
Glucose: 132 mg/dL — ABNORMAL HIGH (ref 70–99)
Potassium: 4.5 mmol/L (ref 3.5–5.2)
Sodium: 140 mmol/L (ref 134–144)
Total Protein: 6.8 g/dL (ref 6.0–8.5)
eGFR: 52 mL/min/1.73 — ABNORMAL LOW (ref >59)

## 2024-02-06 LAB — LIPID PANEL W/O CHOL/HDL RATIO
Cholesterol, Total: 205 mg/dL — ABNORMAL HIGH (ref 100–199)
HDL: 44 mg/dL (ref >39)
LDL Chol Calc (NIH): 137 mg/dL — ABNORMAL HIGH (ref 0–99)
Triglycerides: 136 mg/dL (ref 0–149)
VLDL Cholesterol Cal: 24 mg/dL (ref 5–40)

## 2024-02-06 NOTE — Progress Notes (Signed)
 Contacted via MyChart  Good afternoon Melissa Merritt, your labs have returned: - Kidney function, creatinine and eGFR, continues to show Stage 3a kidney disease. Ensure good water intake at home and avoid Ibuprofen. - Lipid panel continues to show elevations, I continue to recommend statin therapy to help lower levels and prevent stroke and heart attack. Any questions? Keep being amazing!!  Thank you for allowing me to participate in your care.  I appreciate you. Kindest regards, Ozro Russett

## 2024-02-10 ENCOUNTER — Other Ambulatory Visit: Payer: Self-pay | Admitting: Nurse Practitioner

## 2024-02-11 NOTE — Telephone Encounter (Signed)
 Requested medication (s) are due for refill today -expired Rx  Requested medication (s) are on the active medication list -yes  Future visit scheduled -no  Last refill: 07/26/22 18g 3RF  Notes to clinic: expired Rx- sent for provider review   Requested Prescriptions  Pending Prescriptions Disp Refills   albuterol  (VENTOLIN  HFA) 108 (90 Base) MCG/ACT inhaler [Pharmacy Med Name: ALBUTEROL  HFA 90 MCG INHALER] 18 g 0    Sig: Inhale 2 puffs into the lungs every 6 (six) hours as needed.     Pulmonology:  Beta Agonists 2 Passed - 02/11/2024  4:26 PM      Passed - Last BP in normal range    BP Readings from Last 1 Encounters:  02/05/24 128/68         Passed - Last Heart Rate in normal range    Pulse Readings from Last 1 Encounters:  02/05/24 80         Passed - Valid encounter within last 12 months    Recent Outpatient Visits           6 days ago Asthma-COPD overlap syndrome (HCC)   [facility] [place], Melanie T, NP   4 weeks ago Oral thrush   [place] [facility] Melvin Pao, NP   1 month ago Oral phase dysphagia   [place] [facility] [place], Melanie DASEN, NP   1 month ago Oral phase dysphagia   [place] [facility] [place], [place] T, NP   5 months ago Benign neoplasm of right lung   [place] [facility] [place], Melanie T, NP                 Requested Prescriptions  Pending Prescriptions Disp Refills   albuterol  (VENTOLIN  HFA) 108 (90 Base) MCG/ACT inhaler [Pharmacy Med Name: ALBUTEROL  HFA 90 MCG INHALER] 18 g 0    Sig: Inhale 2 puffs into the lungs every 6 (six) hours as needed.     Pulmonology:  Beta Agonists 2 Passed - 02/11/2024  4:26 PM      Passed - Last BP in normal range    BP Readings from Last 1 Encounters:  02/05/24 128/68         Passed - Last Heart Rate in normal range    Pulse Readings from Last 1 Encounters:  02/05/24 80         Passed - Valid encounter  within last 12 months    Recent Outpatient Visits           6 days ago Asthma-COPD overlap syndrome (HCC)   [place] [facility] [place], Melanie T, NP   4 weeks ago Oral thrush   [place] [facility] Melvin Pao, NP   1 month ago Oral phase dysphagia   [place] [facility] [place], Melanie DASEN, NP   1 month ago Oral phase dysphagia   [place] [facility] [place], Melanie T, NP   5 months ago Benign neoplasm of right lung   [place] [place], Melanie DASEN, NP

## 2024-02-19 ENCOUNTER — Inpatient Hospital Stay (HOSPITAL_BASED_OUTPATIENT_CLINIC_OR_DEPARTMENT_OTHER): Admitting: Internal Medicine

## 2024-02-19 ENCOUNTER — Encounter: Payer: Self-pay | Admitting: Internal Medicine

## 2024-02-19 ENCOUNTER — Inpatient Hospital Stay

## 2024-02-19 ENCOUNTER — Inpatient Hospital Stay: Attending: Internal Medicine

## 2024-02-19 VITALS — BP 134/62 | HR 70 | Temp 97.7°F | Resp 16 | Ht 67.01 in | Wt 229.4 lb

## 2024-02-19 DIAGNOSIS — R7303 Prediabetes: Secondary | ICD-10-CM | POA: Diagnosis not present

## 2024-02-19 DIAGNOSIS — R0602 Shortness of breath: Secondary | ICD-10-CM | POA: Insufficient documentation

## 2024-02-19 DIAGNOSIS — R5383 Other fatigue: Secondary | ICD-10-CM | POA: Insufficient documentation

## 2024-02-19 DIAGNOSIS — Z6835 Body mass index (BMI) 35.0-35.9, adult: Secondary | ICD-10-CM

## 2024-02-19 DIAGNOSIS — Z170 Estrogen receptor positive status [ER+]: Secondary | ICD-10-CM | POA: Diagnosis not present

## 2024-02-19 DIAGNOSIS — I110 Hypertensive heart disease with heart failure: Secondary | ICD-10-CM | POA: Diagnosis not present

## 2024-02-19 DIAGNOSIS — Z803 Family history of malignant neoplasm of breast: Secondary | ICD-10-CM | POA: Insufficient documentation

## 2024-02-19 DIAGNOSIS — Z1231 Encounter for screening mammogram for malignant neoplasm of breast: Secondary | ICD-10-CM

## 2024-02-19 DIAGNOSIS — M8580 Other specified disorders of bone density and structure, unspecified site: Secondary | ICD-10-CM | POA: Diagnosis present

## 2024-02-19 DIAGNOSIS — C50811 Malignant neoplasm of overlapping sites of right female breast: Secondary | ICD-10-CM

## 2024-02-19 DIAGNOSIS — E538 Deficiency of other specified B group vitamins: Secondary | ICD-10-CM | POA: Diagnosis not present

## 2024-02-19 DIAGNOSIS — Z79811 Long term (current) use of aromatase inhibitors: Secondary | ICD-10-CM | POA: Diagnosis not present

## 2024-02-19 DIAGNOSIS — Z801 Family history of malignant neoplasm of trachea, bronchus and lung: Secondary | ICD-10-CM | POA: Insufficient documentation

## 2024-02-19 DIAGNOSIS — R1310 Dysphagia, unspecified: Secondary | ICD-10-CM | POA: Diagnosis not present

## 2024-02-19 DIAGNOSIS — Z87891 Personal history of nicotine dependence: Secondary | ICD-10-CM | POA: Diagnosis not present

## 2024-02-19 DIAGNOSIS — M8588 Other specified disorders of bone density and structure, other site: Secondary | ICD-10-CM

## 2024-02-19 DIAGNOSIS — C50911 Malignant neoplasm of unspecified site of right female breast: Secondary | ICD-10-CM | POA: Diagnosis not present

## 2024-02-19 DIAGNOSIS — D509 Iron deficiency anemia, unspecified: Secondary | ICD-10-CM | POA: Diagnosis not present

## 2024-02-19 DIAGNOSIS — R232 Flushing: Secondary | ICD-10-CM | POA: Diagnosis not present

## 2024-02-19 DIAGNOSIS — Z923 Personal history of irradiation: Secondary | ICD-10-CM | POA: Insufficient documentation

## 2024-02-19 LAB — CMP (CANCER CENTER ONLY)
ALT: 23 U/L (ref 0–44)
AST: 24 U/L (ref 15–41)
Albumin: 4.2 g/dL (ref 3.5–5.0)
Alkaline Phosphatase: 68 U/L (ref 38–126)
Anion gap: 10 (ref 5–15)
BUN: 26 mg/dL — ABNORMAL HIGH (ref 8–23)
CO2: 27 mmol/L (ref 22–32)
Calcium: 9.8 mg/dL (ref 8.9–10.3)
Chloride: 100 mmol/L (ref 98–111)
Creatinine: 1.4 mg/dL — ABNORMAL HIGH (ref 0.44–1.00)
GFR, Estimated: 39 mL/min — ABNORMAL LOW (ref >60)
Glucose, Bld: 142 mg/dL — ABNORMAL HIGH (ref 70–99)
Potassium: 4.8 mmol/L (ref 3.5–5.1)
Sodium: 137 mmol/L (ref 135–145)
Total Bilirubin: 0.5 mg/dL (ref 0.0–1.2)
Total Protein: 7.4 g/dL (ref 6.5–8.1)

## 2024-02-19 LAB — CBC WITH DIFFERENTIAL (CANCER CENTER ONLY)
Abs Immature Granulocytes: 0.12 K/uL — ABNORMAL HIGH (ref 0.00–0.07)
Basophils Absolute: 0.1 K/uL (ref 0.0–0.1)
Basophils Relative: 1 %
Eosinophils Absolute: 0.4 K/uL (ref 0.0–0.5)
Eosinophils Relative: 5 %
HCT: 37.6 % (ref 36.0–46.0)
Hemoglobin: 12.3 g/dL (ref 12.0–15.0)
Immature Granulocytes: 2 %
Lymphocytes Relative: 15 %
Lymphs Abs: 1 K/uL (ref 0.7–4.0)
MCH: 29.9 pg (ref 26.0–34.0)
MCHC: 32.7 g/dL (ref 30.0–36.0)
MCV: 91.5 fL (ref 80.0–100.0)
Monocytes Absolute: 0.7 K/uL (ref 0.1–1.0)
Monocytes Relative: 11 %
Neutro Abs: 4.5 K/uL (ref 1.7–7.7)
Neutrophils Relative %: 66 %
Platelet Count: 220 K/uL (ref 150–400)
RBC: 4.11 MIL/uL (ref 3.87–5.11)
RDW: 12.6 % (ref 11.5–15.5)
WBC Count: 6.8 K/uL (ref 4.0–10.5)
nRBC: 0 % (ref 0.0–0.2)

## 2024-02-19 LAB — VITAMIN B12: Vitamin B-12: 2177 pg/mL — ABNORMAL HIGH (ref 180–914)

## 2024-02-19 LAB — IRON AND TIBC
Iron: 70 ug/dL (ref 28–170)
Saturation Ratios: 21 % (ref 10.4–31.8)
TIBC: 325 ug/dL (ref 250–450)
UIBC: 255 ug/dL

## 2024-02-19 LAB — FERRITIN: Ferritin: 737 ng/mL — ABNORMAL HIGH (ref 11–307)

## 2024-02-19 MED ORDER — DENOSUMAB 60 MG/ML ~~LOC~~ SOSY
60.0000 mg | PREFILLED_SYRINGE | Freq: Once | SUBCUTANEOUS | Status: AC
  Administered 2024-02-19: 60 mg via SUBCUTANEOUS
  Filled 2024-02-19: qty 1

## 2024-02-19 NOTE — Assessment & Plan Note (Addendum)
#  2021-Stage Ia right breast cancer status postlumpectomy;  ER/PR positive, HER2/neu negative. s/p adjuvant radiation therapy on 11/17/2019.  Currently on Aromasin  [Fall of 2026]. APRIL 2025 -abnormal mammogram recent breast biopsy-negative for any malignancy.  Stable.   # Patient currently on Aromasin -tolerating well.  No worsening joint pains.  No worsening hot flashes.    # Hot flashes- G-1-2-second Aromasin . Stable.      # Osteopenia-Bone density in April 2025- osteopenia-OFF calcium 1200 mg [PCP- April 2025] continue vitamin D  1000 IU daily.  Continue prolia  today- ? Duration.    #  Iron deficiency anemia-history of iron deficiency anemia.  On oral iron.    Continue oral iron in the interim- stable.    # B12 deficiency.  History of B12 deficiency; OCT 2024- > 2000-  recommend on oral B12 3 times a week- stable.               # DISPOSITION: # mammogram in April 2026-  # Prolia  today-  # follow up- in 6 months- MD; cbc/cmp;iron studies;ferritin;  ca27-29; b12; Vit D 25-OH- prolia - Dr.B

## 2024-02-19 NOTE — Progress Notes (Signed)
 [facility] CONSULT NOTE  Patient Care Team: Valerio Melanie DASEN, NP as PCP - General (Nurse Practitioner) Laurice Francis NOVAK, OD (Optometry) Ammon Blunt, MD as Consulting Physician (Cardiology) Clarisa Kung, PA-C Cathlyn Seal, MD (Dermatology) Dannielle Arlean FALCON, RN (Inactive) as Oncology Nurse Navigator Tye Millet, DO as Consulting Physician (Surgery) Lenn Aran, MD as Radiation Oncologist (Radiation Oncology) Rennie Cindy SAUNDERS, MD as Consulting Physician (Hematology and Oncology) Parris Manna, MD as Consulting Physician (Pulmonary Disease)  CHIEF COMPLAINTS/PURPOSE OF CONSULTATION: right breast cancer   #  Oncology History Overview Note  Bilateral diagnostic mammogram on 07/08/2019 revealed persistent architectural distortion involving the upper outer quadrant of the right breast without sonographic correlate.  There was no pathologic right axillary lymphadenopathy.  No mammographic or sonographic evidence of malignancy in the left breast.  CA27.29 was 19.0 (07/24/19) at diagnosis.   Patient underwent stereotactic biopsy on 07/17/2019.  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  Intermediate nuclear grade with central necrosis.  Consistent with stage Ia right breast cancer.  She underwent partial mastectomy and right axillary sentinel lymph node biopsy on 08/13/2019.  Pathology revealed grade 1 invasive mammary carcinoma, no special type, and intermediate grade DCIS.  There is atypical lobular hyperplasia with ductal involvement.  Additional posterior margin revealed no evidence of residual carcinoma or DCIS.  Closest margin was > 10 mm and 2 mm for DCIS.  1 lymph node was negative for malignancy.  Tumor was ER/PR positive and HER-2/neu negative.  Pathologic stage was pT1a pN0 (sn).   She received 5040 cGy to her right breast and 1400 cGy boost to her scar from 09/28/2019-11/17/2019.  She began tamoxifen  on 11/27/2019.  Tamoxifen   discontinued in December 2021 due to intolerable side effects.  She was switched to Femara , started 04/12/20.  stopped in 9th May 2022 sec to leg swelling.  # AUG 2022- STARTED AROMASIN   Baseline bone density scan in April 2021 consistent with osteopenia with T score of -1.5. Plan for prolia .    Carcinoma of overlapping sites of right breast in female, estrogen receptor positive (HCC)  07/24/2019 Initial Diagnosis   Malignant neoplasm of right breast in female, estrogen receptor positive (HCC)   Carcinoma of overlapping sites of right breast in female, estrogen receptor positive (HCC) (Resolved)  02/14/2021 Initial Diagnosis   Carcinoma of overlapping sites of right breast in female, estrogen receptor positive (HCC)     HISTORY OF PRESENTING ILLNESS: Patient ambulating-independently.   Alone/.  Melissa Merritt 73 y.o.  female with stage I ER/PR positive HER2 negative breast cancer on aromasin  is here for follow up.  Discussed the use of AI scribe software for clinical note transcription with the patient, who gave verbal consent to proceed.  History of Present Illness   Melissa Merritt is a 73 year old female with breast cancer who presents for follow-up.  She is currently on Aromasin  and receives Prolia  without any reported issues. Her last mammogram in April was normal, and a bone density test showed osteopenia. She has one more year of treatment remaining.  She has thyroid  swelling and difficulty swallowing, with a sensation of her throat closing up. A biopsy of her thyroid  revealed three benign nodules. She was diagnosed with esophagitis during an emergency room visit, where her thyroid  swelling was also noted. She is scheduled for further evaluation next week.  She experiences frequent thrush, which she attributes to her medication, particularly an inhaler. She is not diabetic but is  considered prediabetic. She has reduced her soda intake significantly since September 17, opting for  Liquid IV and Body Armor instead.  Her blood counts are stable, and her B12 level was 824 in May. She has stopped taking B12 supplements.      Review of Systems  Constitutional:  Positive for malaise/fatigue. Negative for chills, diaphoresis and fever.  HENT:  Negative for nosebleeds and sore throat.   Eyes:  Negative for double vision.  Respiratory:  Negative for cough, hemoptysis, sputum production, shortness of breath and wheezing.   Cardiovascular:  Negative for chest pain, palpitations, orthopnea and leg swelling.  Gastrointestinal:  Negative for abdominal pain, blood in stool, constipation, diarrhea, heartburn, melena, nausea and vomiting.  Genitourinary:  Negative for dysuria, frequency and urgency.  Musculoskeletal:  Positive for back pain and joint pain.  Skin: Negative.  Negative for itching and rash.  Neurological:  Negative for dizziness, tingling, focal weakness, weakness and headaches.  Endo/Heme/Allergies:  Does not bruise/bleed easily.  Psychiatric/Behavioral:  Negative for depression. The patient is not nervous/anxious and does not have insomnia.      MEDICAL HISTORY:  Past Medical History:  Diagnosis Date   Benign tumor of lung    Breast cancer (HCC)    Cancer (HCC)    right breast   CHF (congestive heart failure) (HCC)    Dyspnea    GERD (gastroesophageal reflux disease)    Hyperlipidemia    Hypertension    Insomnia    Osteopenia    Personal history of radiation therapy    Plantar fasciitis    PONV (postoperative nausea and vomiting)    vomiting 2 days after lung surgery   S/P pneumonectomy    right lower lobe   Urinary incontinence     SURGICAL HISTORY: Past Surgical History:  Procedure Laterality Date   BREAST BIOPSY Right 07/17/2019   Affirm bx-Ribbon clip-IMC & DCIS   BREAST BIOPSY Right 07/31/2021   Atereo bx, x-clip, path pending   BREAST LUMPECTOMY Right 2021   [facility], DCIS, 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   bronchoscopy     CATARACT EXTRACTION,  BILATERAL     Left- 11/21/15, right- 12/23/15   CHOLECYSTECTOMY     COLONOSCOPY WITH PROPOFOL  N/A 06/20/2022   Procedure: COLONOSCOPY WITH PROPOFOL ;  Surgeon: Tye Millet, DO;  Location: [facility] ENDOSCOPY;  Service: General;  Laterality: N/A;   CYSTECTOMY  11/03/2009   cheek   dilation and curretage     EYE SURGERY Bilateral    cataract   FOOT SURGERY Bilateral 2010   LOBECTOMY     LUNG SURGERY Right    PART MASTECTOMY,RADIO FREQUENCY LOCALIZER,AXILLARY SENTINEL NODE BIOPSY Right 08/13/2019   Procedure: PART MASTECTOMY,RADIO FREQUENCY LOCALIZER,AXILLARY SENTINEL NODE BIOPSY;  Surgeon: Tye Millet, DO;  Location: [facility] ORS;  Service: General;  Laterality: Right;   UPPER GI ENDOSCOPY  2005    SOCIAL HISTORY: Social History   Socioeconomic History   Marital status: Married    Spouse name: Jonette   Number of children: 3   Years of education: Not on file   Highest education level: Some college, no degree  Occupational History   Occupation: retired   Tobacco Use   Smoking status: Former    Current packs/day: 0.00    Average packs/day: 2.0 packs/day for 10.2 years (20.5 ttl pk-yrs)    Types: Cigarettes    Start date: 65    Quit date: 07/09/1979    Years since quitting: 44.6   Smokeless tobacco: Never  Vaping Use  Vaping status: Never Used  Substance and Sexual Activity   Alcohol use: No   Drug use: No   Sexual activity: Yes  Other Topics Concern   Not on file  Social History Narrative   Son lives next door - another son and daughter live out of town   Social Drivers of Corporate Investment Banker Strain: Low Risk  (02/01/2024)   Overall Financial Resource Strain (CARDIA)    Difficulty of Paying Living Expenses: Not hard at all  Food Insecurity: No Food Insecurity (02/01/2024)   Hunger Vital Sign    Worried About Running Out of Food in the Last Year: Never true    Ran Out of Food in the Last Year: Never true  Transportation Needs: No Transportation Needs (02/01/2024)    PRAPARE - Administrator, Civil Service (Medical): No    Lack of Transportation (Non-Medical): No  Physical Activity: Insufficiently Active (02/01/2024)   Exercise Vital Sign    Days of Exercise per Week: 3 days    Minutes of Exercise per Session: 20 min  Stress: Stress Concern Present (02/01/2024)   [employer] of Occupational Health - Occupational Stress Questionnaire    Feeling of Stress: To some extent  Social Connections: Socially Integrated (02/01/2024)   Social Connection and Isolation Panel    Frequency of Communication with Friends and Family: More than three times a week    Frequency of Social Gatherings with Friends and Family: More than three times a week    Attends Religious Services: More than 4 times per year    Active Member of [employer] or Organizations: Yes    Attends Engineer, Structural: More than 4 times per year    Marital Status: Married  Catering Manager Violence: Not At Risk (06/18/2023)   Humiliation, Afraid, Rape, and Kick questionnaire    Fear of Current or Ex-Partner: No    Emotionally Abused: No    Physically Abused: No    Sexually Abused: No    FAMILY HISTORY: Family History  Problem Relation Age of Onset   COPD Mother    Hypertension Mother    Emphysema Mother    Osteoporosis Mother    Heart disease Father    Hyperlipidemia Sister    Hyperlipidemia Brother    Heart disease Maternal Grandmother    Heart disease Maternal Grandfather    Heart disease Paternal Grandmother    Stroke Paternal Grandmother    Heart disease Paternal Grandfather    Stroke Paternal Grandfather    Cancer Maternal Aunt        breast and lung   Osteoporosis Maternal Aunt    Aortic aneurysm Maternal Aunt    Breast cancer Maternal Aunt 43    ALLERGIES:  is allergic to contrast media [iodinated contrast media], iodine, other, ace inhibitors, hctz [hydrochlorothiazide], and keflex [cephalexin].  MEDICATIONS:  Current Outpatient Medications   Medication Sig Dispense Refill   albuterol  (VENTOLIN  HFA) 108 (90 Base) MCG/ACT inhaler Inhale 2 puffs into the lungs every 6 (six) hours as needed. 18 g 0   amLODipine  (NORVASC ) 5 MG tablet Take 1 tablet (5 mg total) by mouth daily. 90 tablet 4   azelastine  (ASTELIN ) 0.1 % nasal spray Place 2 sprays into both nostrils 2 (two) times daily. Use in each nostril as directed     cholecalciferol (VITAMIN D3) 25 MCG (1000 UNIT) tablet Take 1,000 Units by mouth in the morning and at bedtime.     clotrimazole (MYCELEX) 10 MG  troche Take 10 mg by mouth 5 (five) times daily.     Cyanocobalamin  (B-12) 2500 MCG TABS Take 2,500 mcg by mouth daily. (Patient taking differently: Take 2,500 mcg by mouth 3 (three) times a week.)     denosumab  (PROLIA ) 60 MG/ML SOSY injection Inject 60 mg into the skin every 6 (six) months.     exemestane  (AROMASIN ) 25 MG tablet Take 1 tablet (25 mg total) by mouth daily after breakfast. 90 tablet 0   furosemide (LASIX) 20 MG tablet Take 20 mg by mouth daily.     metoprolol  succinate (TOPROL -XL) 25 MG 24 hr tablet Take 1 tablet (25 mg total) by mouth daily. 90 tablet 3   pantoprazole  (PROTONIX ) 40 MG tablet Take 1 tablet (40 mg total) by mouth 2 (two) times daily. 180 tablet 4   Red Yeast Rice Extract (RED YEAST RICE PO) Take 1 tablet by mouth in the morning and at bedtime.     spironolactone (ALDACTONE) 25 MG tablet Take 25 mg by mouth daily.     St Johns Wort 300 MG CAPS Take 300 mg by mouth 2 (two) times daily.     TRELEGY ELLIPTA 100-62.5-25 MCG/INH AEPB Inhale 1 puff into the lungs daily.     valsartan  (DIOVAN ) 320 MG tablet Take 1 tablet (320 mg total) by mouth daily. 90 tablet 4   No current facility-administered medications for this visit.      SABRA  PHYSICAL EXAMINATION: ECOG PERFORMANCE STATUS: 0 - Asymptomatic  Vitals:   02/19/24 1020  BP: 134/62  Pulse: 70  Resp: 16  Temp: 97.7 F (36.5 C)  SpO2: 97%     Filed Weights   02/19/24 1020  Weight: 229 lb  6.4 oz (104.1 kg)      Physical Exam Vitals and nursing note reviewed.  Constitutional:      Comments: Alone.  Ambulating independently.      HENT:     Head: Normocephalic and atraumatic.     Mouth/Throat:     Pharynx: Oropharynx is clear.  Eyes:     Extraocular Movements: Extraocular movements intact.     Pupils: Pupils are equal, round, and reactive to light.  Cardiovascular:     Rate and Rhythm: Normal rate and regular rhythm.  Pulmonary:     Comments: Decreased breath sounds bilaterally.  Abdominal:     Palpations: Abdomen is soft.  Musculoskeletal:        General: Normal range of motion.     Cervical back: Normal range of motion.  Skin:    General: Skin is warm.  Neurological:     General: No focal deficit present.     Mental Status: She is alert and oriented to person, place, and time.  Psychiatric:        Behavior: Behavior normal.        Judgment: Judgment normal.      LABORATORY DATA:  I have reviewed the data as listed Lab Results  Component Value Date   WBC 6.8 02/19/2024   HGB 12.3 02/19/2024   HCT 37.6 02/19/2024   MCV 91.5 02/19/2024   PLT 220 02/19/2024   Recent Labs    08/19/23 0942 02/05/24 1012 02/19/24 1009  NA 136 140 137  K 3.9 4.5 4.8  CL 100 101 100  CO2 27 26 27   GLUCOSE 139* 132* 142*  BUN 24* 21 26*  CREATININE 1.12* 1.10* 1.40*  CALCIUM 10.0 10.4* 9.8  GFRNONAA 52*  --  39*  PROT 7.4  6.8 7.4  ALBUMIN 4.0 4.5 4.2  AST 20 19 24   ALT 25 21 23   ALKPHOS 77 76 68  BILITOT 0.6 0.4 0.5    RADIOGRAPHIC STUDIES: I have personally reviewed the radiological images as listed and agreed with the findings in the report. No results found.   ASSESSMENT & PLAN:   Carcinoma of overlapping sites of right breast in female, estrogen receptor positive (HCC)  #2021-Stage Ia right breast cancer status postlumpectomy;  ER/PR positive, HER2/neu negative. s/p adjuvant radiation therapy on 11/17/2019.  Currently on Aromasin  [Fall of 2026].  APRIL 2025 -abnormal mammogram recent breast biopsy-negative for any malignancy.  Stable.   # Patient currently on Aromasin -tolerating well.  No worsening joint pains.  No worsening hot flashes.    # Hot flashes- G-1-2-second Aromasin . Stable.      # Osteopenia-Bone density in April 2025- osteopenia-OFF calcium 1200 mg [PCP- April 2025] continue vitamin D  1000 IU daily.  Continue prolia  today- ? Duration.    #  Iron deficiency anemia-history of iron deficiency anemia.  On oral iron.    Continue oral iron in the interim- stable.    # B12 deficiency.  History of B12 deficiency; OCT 2024- > 2000-  recommend on oral B12 3 times a week- stable.               # DISPOSITION: # mammogram in April 2026-  # Prolia  today-  # follow up- in 6 months- MD; cbc/cmp;iron studies;ferritin;  ca27-29; b12; Vit D 25-OH- prolia - Dr.B      All questions were answered. The patient knows to call the clinic with any problems, questions or concerns.    Cindy JONELLE Joe, MD 02/19/2024 1:07 PM

## 2024-02-19 NOTE — Progress Notes (Signed)
 [facility] 12/26/23 for - Thyromegaly, dysphagia, thrush. Has appt with Dr. Maryruth for possible endoscopy next week.

## 2024-02-20 LAB — CANCER ANTIGEN 27.29: CA 27.29: 18.1 U/mL (ref 0.0–38.6)

## 2024-02-25 DIAGNOSIS — R933 Abnormal findings on diagnostic imaging of other parts of digestive tract: Secondary | ICD-10-CM | POA: Diagnosis not present

## 2024-03-04 ENCOUNTER — Encounter: Payer: Self-pay | Admitting: *Deleted

## 2024-03-17 DIAGNOSIS — M5416 Radiculopathy, lumbar region: Secondary | ICD-10-CM | POA: Diagnosis not present

## 2024-03-20 ENCOUNTER — Ambulatory Visit: Admitting: Certified Registered"

## 2024-03-20 ENCOUNTER — Encounter: Admission: RE | Disposition: A | Payer: Self-pay | Source: Home / Self Care | Attending: Gastroenterology

## 2024-03-20 ENCOUNTER — Ambulatory Visit
Admission: RE | Admit: 2024-03-20 | Discharge: 2024-03-20 | Disposition: A | Attending: Gastroenterology | Admitting: Gastroenterology

## 2024-03-20 DIAGNOSIS — E669 Obesity, unspecified: Secondary | ICD-10-CM | POA: Diagnosis not present

## 2024-03-20 DIAGNOSIS — G4730 Sleep apnea, unspecified: Secondary | ICD-10-CM | POA: Diagnosis not present

## 2024-03-20 DIAGNOSIS — K224 Dyskinesia of esophagus: Secondary | ICD-10-CM | POA: Diagnosis not present

## 2024-03-20 DIAGNOSIS — E785 Hyperlipidemia, unspecified: Secondary | ICD-10-CM | POA: Diagnosis not present

## 2024-03-20 DIAGNOSIS — Z7951 Long term (current) use of inhaled steroids: Secondary | ICD-10-CM | POA: Diagnosis not present

## 2024-03-20 DIAGNOSIS — I509 Heart failure, unspecified: Secondary | ICD-10-CM | POA: Diagnosis not present

## 2024-03-20 DIAGNOSIS — K219 Gastro-esophageal reflux disease without esophagitis: Secondary | ICD-10-CM | POA: Diagnosis not present

## 2024-03-20 DIAGNOSIS — J449 Chronic obstructive pulmonary disease, unspecified: Secondary | ICD-10-CM | POA: Diagnosis not present

## 2024-03-20 DIAGNOSIS — R933 Abnormal findings on diagnostic imaging of other parts of digestive tract: Secondary | ICD-10-CM | POA: Diagnosis present

## 2024-03-20 DIAGNOSIS — I110 Hypertensive heart disease with heart failure: Secondary | ICD-10-CM | POA: Diagnosis not present

## 2024-03-20 DIAGNOSIS — R0602 Shortness of breath: Secondary | ICD-10-CM | POA: Diagnosis not present

## 2024-03-20 DIAGNOSIS — Z87891 Personal history of nicotine dependence: Secondary | ICD-10-CM | POA: Diagnosis not present

## 2024-03-20 DIAGNOSIS — Z79899 Other long term (current) drug therapy: Secondary | ICD-10-CM | POA: Diagnosis not present

## 2024-03-20 DIAGNOSIS — Z79811 Long term (current) use of aromatase inhibitors: Secondary | ICD-10-CM | POA: Diagnosis not present

## 2024-03-20 DIAGNOSIS — Z6835 Body mass index (BMI) 35.0-35.9, adult: Secondary | ICD-10-CM | POA: Diagnosis not present

## 2024-03-20 DIAGNOSIS — Z902 Acquired absence of lung [part of]: Secondary | ICD-10-CM | POA: Diagnosis not present

## 2024-03-20 HISTORY — PX: ESOPHAGOGASTRODUODENOSCOPY: SHX5428

## 2024-03-20 HISTORY — DX: Heart disease, unspecified: I51.9

## 2024-03-20 HISTORY — DX: Sleep apnea, unspecified: G47.30

## 2024-03-20 HISTORY — DX: Uterovaginal prolapse, unspecified: N81.4

## 2024-03-20 HISTORY — DX: Personal history of adenomatous and serrated colon polyps: Z86.0101

## 2024-03-20 SURGERY — EGD (ESOPHAGOGASTRODUODENOSCOPY)
Anesthesia: General

## 2024-03-20 MED ORDER — SODIUM CHLORIDE 0.9 % IV SOLN
INTRAVENOUS | Status: DC
  Administered 2024-03-20: 500 mL via INTRAVENOUS

## 2024-03-20 MED ORDER — PROPOFOL 500 MG/50ML IV EMUL
INTRAVENOUS | Status: DC | PRN
  Administered 2024-03-20: 30 mg via INTRAVENOUS
  Administered 2024-03-20: 20 mg via INTRAVENOUS
  Administered 2024-03-20: 80 mg via INTRAVENOUS
  Administered 2024-03-20: 20 mg via INTRAVENOUS

## 2024-03-20 MED ORDER — DEXMEDETOMIDINE HCL IN NACL 80 MCG/20ML IV SOLN
INTRAVENOUS | Status: DC | PRN
  Administered 2024-03-20: 12 ug via INTRAVENOUS

## 2024-03-20 MED ADMIN — Lidocaine HCl(Cardiac) IV PF Soln Pref Syr 100 MG/5ML (2%): 100 mg | INTRAVENOUS | NDC 00409132305

## 2024-03-20 NOTE — Anesthesia Preprocedure Evaluation (Signed)
 Anesthesia Evaluation  Patient identified by MRN, date of birth, ID band Patient awake    Reviewed: Allergy & Precautions, H&P , NPO status , Patient's Chart, lab work & pertinent test results  History of Anesthesia Complications (+) PONV and history of anesthetic complications (severe PONV after lobectomy)  Airway Mallampati: III  TM Distance: >3 FB Neck ROM: full    Dental  (+) Chipped, Dental Advidsory Given   Pulmonary shortness of breath (chronic for months, followed by Pulmonology) and with exertion, sleep apnea , COPD, neg recent URI, former smoker S/p RLL lobectomy for lung CA Chronic SOB, noted to have elevated D-dimer on labs drawn on 08/10/19, sent to ED last night after the lab resulted. CTA in ED was negative for PE.  Pt states SOB is at baseline for her  No increased WOB Pulmonary exam normal breath sounds clear to auscultation       Cardiovascular hypertension, (-) angina +CHF  Normal cardiovascular exam Rhythm:regular Rate:Normal  Echo 07/02/19: MILD LV SYSTOLIC DYSFUNCTION WITH AN ESTIMATED EF = 45-50 % NORMAL RIGHT VENTRICULAR SYSTOLIC FUNCTION TRACE TRICUSPID AND MITRAL VALVE INSUFFICIENCY NO VALVULAR STENOSIS MILD LV ENLARGEMENT   Neuro/Psych negative neurological ROS  negative psych ROS   GI/Hepatic Neg liver ROS,GERD  Controlled,,  Endo/Other  negative endocrine ROS    Renal/GU negative Renal ROS  negative genitourinary   Musculoskeletal   Abdominal   Peds  Hematology negative hematology ROS (+)   Anesthesia Other Findings Past Medical History: No date: Benign tumor of lung No date: Breast cancer (HCC) No date: Cancer (HCC)     Comment:  right breast No date: CHF (congestive heart failure) (HCC) No date: Dyspnea No date: GERD (gastroesophageal reflux disease) No date: Hyperlipidemia No date: Hypertension No date: Insomnia No date: Osteopenia No date: Personal history of radiation  therapy No date: Plantar fasciitis No date: PONV (postoperative nausea and vomiting)     Comment:  vomiting 2 days after lung surgery No date: S/P pneumonectomy     Comment:  right lower lobe No date: Urinary incontinence  Past Surgical History: 07/17/2019: BREAST BIOPSY; Right     Comment:  Affirm bx-Ribbon clip-IMC & DCIS 07/31/2021: BREAST BIOPSY; Right     Comment:  Atereo bx, x-clip, path pending 2021: BREAST LUMPECTOMY; Right     Comment:  [facility], DCIS, 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 No date: bronchoscopy No date: CATARACT EXTRACTION, BILATERAL     Comment:  Left- 11/21/15, right- 12/23/15 No date: CHOLECYSTECTOMY 11/03/2009: CYSTECTOMY     Comment:  cheek No date: dilation and curretage No date: EYE SURGERY; Bilateral     Comment:  cataract 2010: FOOT SURGERY; Bilateral No date: LOBECTOMY No date: LUNG SURGERY; Right 08/13/2019: PART MASTECTOMY,RADIO FREQUENCY LOCALIZER,AXILLARY  SENTINEL NODE BIOPSY; Right     Comment:  Procedure: PART MASTECTOMY,RADIO FREQUENCY               LOCALIZER,AXILLARY SENTINEL NODE BIOPSY;  Surgeon: Tye Millet, DO;  Location: [facility] ORS;  Service: General;                Laterality: Right; 2005: UPPER GI ENDOSCOPY  BMI    Body Mass Index: 35.24 kg/m      Reproductive/Obstetrics negative OB ROS                              Anesthesia Physical Anesthesia Plan  ASA: 3  Anesthesia Plan: General   Post-op Pain Management: Minimal or no pain anticipated   Induction: Intravenous  PONV Risk Score and Plan: 4 or greater and Propofol  infusion and TIVA  Airway Management Planned: Nasal Cannula and Natural Airway  Additional Equipment: None  Intra-op Plan:   Post-operative Plan:   Informed Consent: I have reviewed the patients History and Physical, chart, labs and discussed the procedure including the risks, benefits and alternatives for the proposed anesthesia with the patient or authorized  representative who has indicated his/her understanding and acceptance.     Dental advisory given  Plan Discussed with: CRNA and Surgeon  Anesthesia Plan Comments: (Discussed risks of anesthesia with patient, including possibility of difficulty with spontaneous ventilation under anesthesia necessitating airway intervention, PONV, and rare risks such as cardiac or respiratory or neurological events, and allergic reactions. Discussed the role of CRNA in patient's perioperative care. Patient understands.)         Anesthesia Quick Evaluation

## 2024-03-20 NOTE — Interval H&P Note (Signed)
 History and Physical Interval Note:  03/20/2024 9:52 AM  Melissa Merritt  has presented today for surgery, with the diagnosis of Abnormal CT scan, gastrointestinal tract (R93.3).  The various methods of treatment have been discussed with the patient and family. After consideration of risks, benefits and other options for treatment, the patient has consented to  Procedures: EGD (ESOPHAGOGASTRODUODENOSCOPY) (N/A) as a surgical intervention.  The patient's history has been reviewed, patient examined, no change in status, stable for surgery.  I have reviewed the patient's chart and labs.  Questions were answered to the patient's satisfaction.     [name]  Ok to proceed with EGD

## 2024-03-20 NOTE — H&P (Signed)
 Outpatient short stay form Pre-procedure 03/20/2024  [name], MD  Primary Physician: Valerio Melanie ONEIDA, NP  Reason for visit:  Abnormal CT  History of present illness:    73 y/o lady with history of hypertension, obesity, COPD, asthma, and HLD here for EGD for ct showing possible esophagitis and difficulty with swallowing. No blood thinners. No neck surgeries.   Current Medications[1]  Medications Prior to Admission  Medication Sig Dispense Refill Last Dose/Taking   amLODipine  (NORVASC ) 5 MG tablet Take 1 tablet (5 mg total) by mouth daily. 90 tablet 4 03/20/2024 Morning   azelastine  (ASTELIN ) 0.1 % nasal spray Place 2 sprays into both nostrils 2 (two) times daily. Use in each nostril as directed   03/19/2024   cholecalciferol (VITAMIN D3) 25 MCG (1000 UNIT) tablet Take 1,000 Units by mouth in the morning and at bedtime.   Past Month   clotrimazole (MYCELEX) 10 MG troche Take 10 mg by mouth 5 (five) times daily.   Past Month   denosumab  (PROLIA ) 60 MG/ML SOSY injection Inject 60 mg into the skin every 6 (six) months.   Past Month   furosemide (LASIX) 20 MG tablet Take 20 mg by mouth daily.   03/19/2024   metoprolol  succinate (TOPROL -XL) 25 MG 24 hr tablet Take 1 tablet (25 mg total) by mouth daily. 90 tablet 3 03/20/2024   pantoprazole  (PROTONIX ) 40 MG tablet Take 1 tablet (40 mg total) by mouth 2 (two) times daily. 180 tablet 4 03/20/2024 Morning   Red Yeast Rice Extract (RED YEAST RICE PO) Take 1 tablet by mouth in the morning and at bedtime.   Past Week   spironolactone (ALDACTONE) 25 MG tablet Take 25 mg by mouth daily.   03/20/2024   St Johns Wort 300 MG CAPS Take 300 mg by mouth 2 (two) times daily.   Past Week   TRELEGY ELLIPTA 100-62.5-25 MCG/INH AEPB Inhale 1 puff into the lungs daily.   03/20/2024   valsartan  (DIOVAN ) 320 MG tablet Take 1 tablet (320 mg total) by mouth daily. 90 tablet 4 03/20/2024 Morning   albuterol  (VENTOLIN  HFA) 108 (90 Base) MCG/ACT inhaler  Inhale 2 puffs into the lungs every 6 (six) hours as needed. 18 g 0    Cyanocobalamin  (B-12) 2500 MCG TABS Take 2,500 mcg by mouth daily. (Patient taking differently: Take 2,500 mcg by mouth 3 (three) times a week.)      exemestane  (AROMASIN ) 25 MG tablet Take 1 tablet (25 mg total) by mouth daily after breakfast. (Patient not taking: Reported on 03/20/2024) 90 tablet 0 Completed Course     Allergies[2]   Past Medical History:  Diagnosis Date   Benign tumor of lung    Breast cancer (HCC)    Cancer (HCC)    right breast   CHF (congestive heart failure) (HCC)    Dyspnea    GERD (gastroesophageal reflux disease)    Hx of adenomatous colonic polyps    Hyperlipidemia    Hypertension    Insomnia    Left ventricular dysfunction    Osteopenia    Personal history of radiation therapy    Plantar fasciitis    PONV (postoperative nausea and vomiting)    vomiting 2 days after lung surgery   S/P pneumonectomy    right lower lobe   Sleep apnea    Urinary incontinence    Uterovaginal prolapse     Review of systems:  Otherwise negative.    Physical Exam  Gen: Alert, oriented. Appears stated  age.  HEENT: PERRLA. Lungs: No respiratory distress CV: RRR Abd: soft, benign, no masses Ext: No edema    Planned procedures: Proceed with EGD. The patient understands the nature of the planned procedure, indications, risks, alternatives and potential complications including but not limited to bleeding, infection, perforation, damage to internal organs and possible oversedation/side effects from anesthesia. The patient agrees and gives consent to proceed.  Please refer to procedure notes for findings, recommendations and patient disposition/instructions.     [name], MD [facility]         [1]  Current Facility-Administered Medications:    0.9 %  sodium chloride  infusion, , Intravenous, Continuous, Sherrol Vicars, Ole ONEIDA, MD, Last Rate: 20 mL/hr at 03/20/24  0945, Continued from Pre-op at 03/20/24 0945 [2]  Allergies Allergen Reactions   Contrast Media [Iodinated Contrast Media] Swelling    Eye swelling shut   Iodine Swelling    Angioedema of eyes   Other Swelling    Eye swelling shut IV contrast media    Ace Inhibitors Other (See Comments)    Makes me feel strange.   Hctz [Hydrochlorothiazide] Other (See Comments)    arms ache   Keflex [Cephalexin] Other (See Comments)    thrush

## 2024-03-20 NOTE — Transfer of Care (Signed)
 Immediate Anesthesia Transfer of Care Note  Patient: Melissa Merritt  Procedure(s) Performed: EGD (ESOPHAGOGASTRODUODENOSCOPY)  Patient Location: Endoscopy Unit  Anesthesia Type:General  Level of Consciousness: drowsy  Airway & Oxygen Therapy: Patient Spontanous Breathing  Post-op Assessment: Report given to RN and Post -op Vital signs reviewed and stable  Post vital signs: Reviewed and stable  Last Vitals:  Vitals Value Taken Time  BP 86/53 03/20/24 10:10  Temp    Pulse 63 03/20/24 10:10  Resp 20 03/20/24 10:10  SpO2 97 % 03/20/24 10:10    Last Pain:  Vitals:   03/20/24 1010  TempSrc:   PainSc: 0-No pain         Complications: No notable events documented.

## 2024-03-20 NOTE — Op Note (Signed)
 [facility] Patient Name: Melissa Merritt Procedure Date: 03/20/2024 9:39 AM MRN: [id number] Account #: [id number] Date of Birth: 01-03-1951 Admit Type: Outpatient Age: 73 Room: [facility] ENDO ROOM 3 Gender: Female Note Status: Finalized Instrument Name: Upper GI Scope [id number] Procedure:             Upper GI endoscopy Indications:           Abnormal CT of the GI tract Providers:             Ole Schick MD, MD Referring MD:          Ole Schick MD, MD (Referring MD), Melanie DASEN.                         Valerio (Referring MD) Medicines:             Monitored Anesthesia Care Complications:         No immediate complications. Procedure:             Pre-Anesthesia Assessment:                        - Prior to the procedure, a History and Physical was                         performed, and patient medications and allergies were                         reviewed. The patient is competent. The risks and                         benefits of the procedure and the sedation options and                         risks were discussed with the patient. All questions                         were answered and informed consent was obtained.                         Patient identification and proposed procedure were                         verified by the physician, the nurse, the                         anesthesiologist, the anesthetist and the technician                         in the endoscopy suite. Mental Status Examination:                         alert and oriented. Airway Examination: normal                         oropharyngeal airway and neck mobility. Respiratory                         Examination: clear to auscultation. CV Examination:  normal. Prophylactic Antibiotics: The patient does not                         require prophylactic antibiotics. Prior                         Anticoagulants: The patient has taken no anticoagulant                          or antiplatelet agents. ASA Grade Assessment: III - A                         patient with severe systemic disease. After reviewing                         the risks and benefits, the patient was deemed in                         satisfactory condition to undergo the procedure. The                         anesthesia plan was to use monitored anesthesia care                         (MAC). Immediately prior to administration of                         medications, the patient was re-assessed for adequacy                         to receive sedatives. The heart rate, respiratory                         rate, oxygen saturations, blood pressure, adequacy of                         pulmonary ventilation, and response to care were                         monitored throughout the procedure. The physical                         status of the patient was re-assessed after the                         procedure.                        After obtaining informed consent, the endoscope was                         passed under direct vision. Throughout the procedure,                         the patient's blood pressure, pulse, and oxygen                         saturations were monitored continuously. The Endoscope  was introduced through the mouth, and advanced to the                         second part of duodenum. The upper GI endoscopy was                         accomplished without difficulty. The patient tolerated                         the procedure well. Findings:      Abnormal motility was noted in the esophagus. The cricopharyngeus was       abnormal. There is a decrease in motility of the esophageal body.      The exam of the esophagus was otherwise normal.      The entire examined stomach was normal.      The examined duodenum was normal. Impression:            - Abnormal esophageal motility.                        - Normal stomach.                         - Normal examined duodenum.                        - No specimens collected. Recommendation:        - Discharge patient to home.                        - Resume previous diet.                        - Continue present medications.                        - Perform a barium swallow using barium in liquid and                         tablet form at appointment to be scheduled.                        - Return to referring physician as previously                         scheduled. Procedure Code(s):     --- Professional ---                        [phone], Esophagogastroduodenoscopy, flexible,                         transoral; diagnostic, including collection of                         specimen(s) by brushing or washing, when performed                         (separate procedure) Diagnosis Code(s):     --- Professional ---  K22.4, Dyskinesia of esophagus                        R93.3, Abnormal findings on diagnostic imaging of                         other parts of digestive tract CPT copyright 2022 American Medical Association. All rights reserved. The codes documented in this report are preliminary and upon coder review may  be revised to meet current compliance requirements. Ole Schick MD, MD 03/20/2024 10:06:59 AM Number of Addenda: 0 Note Initiated On: 03/20/2024 9:39 AM Estimated Blood Loss:  Estimated blood loss: none.      [facility]

## 2024-03-25 DIAGNOSIS — M5416 Radiculopathy, lumbar region: Secondary | ICD-10-CM | POA: Diagnosis not present

## 2024-03-28 NOTE — Anesthesia Postprocedure Evaluation (Signed)
"   Anesthesia Post Note  Patient: FRANSHESCA CHIPMAN  Procedure(s) Performed: EGD (ESOPHAGOGASTRODUODENOSCOPY)  Patient location during evaluation: Endoscopy Anesthesia Type: General Level of consciousness: awake and alert Pain management: pain level controlled Vital Signs Assessment: post-procedure vital signs reviewed and stable Respiratory status: spontaneous breathing, nonlabored ventilation, respiratory function stable and patient connected to nasal cannula oxygen Cardiovascular status: blood pressure returned to baseline and stable Postop Assessment: no apparent nausea or vomiting Anesthetic complications: no   No notable events documented.   Last Vitals:  Vitals:   03/20/24 1020 03/20/24 1031  BP: (!) 103/50 (!) 100/54  Pulse: 62 61  Resp: 20 15  Temp:    SpO2: 95% 98%    Last Pain:  Vitals:   03/23/24 0908  TempSrc:   PainSc: 0-No pain                 Prentice Murphy      "

## 2024-04-07 ENCOUNTER — Other Ambulatory Visit: Payer: Self-pay | Admitting: Nurse Practitioner

## 2024-04-07 ENCOUNTER — Ambulatory Visit: Payer: Self-pay

## 2024-04-07 NOTE — Progress Notes (Unsigned)
 "  Subjective:    Patient ID: Melissa Merritt, female    DOB: 10-14-1950, 73 y.o.   MRN: [id number]  HPI  Discussed the use of AI scribe software for clinical note transcription with the patient, who gave verbal consent to proceed.  Melissa Merritt is a 73 year old female with COPD who presents with flu-like symptoms.  She has been experiencing a headache, runny nose, fatigue, and cough for the past two days. The headache is located in her head rather than her face, and she has nasal congestion with 'gross stuff' coming out when blowing her nose. She also has a scratchy throat and shortness of breath.  Her medical history includes COPD and asthma. She smoked for ten years starting at age 33. She uses Trelegy regularly and albuterol  occasionally. In 2005, she underwent a right lobectomy due to lung cancer.  She mentions her twin grandbabies were sick around Christmas, but they have since recovered. For her current symptoms, she has taken Tylenol  and cough drops but is cautious about using over-the-counter medications due to concerns about her lungs.       Review of Systems   Past Medical History:  Diagnosis Date   Benign tumor of lung    Breast cancer (HCC)    Cancer (HCC)    right breast   CHF (congestive heart failure) (HCC)    Dyspnea    GERD (gastroesophageal reflux disease)    Hx of adenomatous colonic polyps    Hyperlipidemia    Hypertension    Insomnia    Left ventricular dysfunction    Osteopenia    Personal history of radiation therapy    Plantar fasciitis    PONV (postoperative nausea and vomiting)    vomiting 2 days after lung surgery   S/P pneumonectomy    right lower lobe   Sleep apnea    Urinary incontinence    Uterovaginal prolapse     Current Outpatient Medications  Medication Sig Dispense Refill   albuterol  (VENTOLIN  HFA) 108 (90 Base) MCG/ACT inhaler Inhale 2 puffs into the lungs every 6 (six) hours as needed. 18 g 0   amLODipine  (NORVASC ) 5 MG  tablet Take 1 tablet (5 mg total) by mouth daily. 90 tablet 4   azelastine  (ASTELIN ) 0.1 % nasal spray Place 2 sprays into both nostrils 2 (two) times daily. Use in each nostril as directed     cholecalciferol (VITAMIN D3) 25 MCG (1000 UNIT) tablet Take 1,000 Units by mouth in the morning and at bedtime.     clotrimazole (MYCELEX) 10 MG troche Take 10 mg by mouth 5 (five) times daily.     Cyanocobalamin  (B-12) 2500 MCG TABS Take 2,500 mcg by mouth daily. (Patient taking differently: Take 2,500 mcg by mouth 3 (three) times a week.)     denosumab  (PROLIA ) 60 MG/ML SOSY injection Inject 60 mg into the skin every 6 (six) months.     exemestane  (AROMASIN ) 25 MG tablet Take 1 tablet (25 mg total) by mouth daily after breakfast. (Patient not taking: Reported on 03/20/2024) 90 tablet 0   furosemide (LASIX) 20 MG tablet Take 20 mg by mouth daily.     metoprolol  succinate (TOPROL -XL) 25 MG 24 hr tablet Take 1 tablet (25 mg total) by mouth daily. 90 tablet 3   pantoprazole  (PROTONIX ) 40 MG tablet Take 1 tablet (40 mg total) by mouth 2 (two) times daily. 180 tablet 4   Red Yeast Rice Extract (RED YEAST RICE PO) Take 1  tablet by mouth in the morning and at bedtime.     spironolactone (ALDACTONE) 25 MG tablet Take 25 mg by mouth daily.     St Johns Wort 300 MG CAPS Take 300 mg by mouth 2 (two) times daily.     TRELEGY ELLIPTA 100-62.5-25 MCG/INH AEPB Inhale 1 puff into the lungs daily.     valsartan  (DIOVAN ) 320 MG tablet Take 1 tablet (320 mg total) by mouth daily. 90 tablet 4   No current facility-administered medications for this visit.    Allergies[1]  Family History  Problem Relation Age of Onset   COPD Mother    Hypertension Mother    Emphysema Mother    Osteoporosis Mother    Heart disease Father    Hyperlipidemia Sister    Hyperlipidemia Brother    Heart disease Maternal Grandmother    Heart disease Maternal Grandfather    Heart disease Paternal Grandmother    Stroke Paternal Grandmother     Heart disease Paternal Grandfather    Stroke Paternal Grandfather    Cancer Maternal Aunt        breast and lung   Osteoporosis Maternal Aunt    Aortic aneurysm Maternal Aunt    Breast cancer Maternal Aunt 18    Social History   Socioeconomic History   Marital status: Married    Spouse name: Jonette   Number of children: 3   Years of education: Not on file   Highest education level: Some college, no degree  Occupational History   Occupation: retired   Tobacco Use   Smoking status: Former    Current packs/day: 0.00    Average packs/day: 2.0 packs/day for 10.2 years (20.5 ttl pk-yrs)    Types: Cigarettes    Start date: 38    Quit date: 07/09/1979    Years since quitting: 44.7   Smokeless tobacco: Never  Vaping Use   Vaping status: Never Used  Substance and Sexual Activity   Alcohol use: No   Drug use: No   Sexual activity: Yes  Other Topics Concern   Not on file  Social History Narrative   Son lives next door - another son and daughter live out of town   Social Drivers of Health   Tobacco Use: Medium Risk (04/06/2024)   Received from [facility]   Patient History    Smoking Tobacco Use: Former    Smokeless Tobacco Use: Never    Passive Exposure: Not on file  Financial Resource Strain: Low Risk  (04/06/2024)   Received from [facility]   Overall Financial Resource Strain (CARDIA)    Difficulty of Paying Living Expenses: Not hard at all  Food Insecurity: No Food Insecurity (04/06/2024)   Received from [facility]   Epic    Within the past 12 months, you worried that your food would run out before you got the money to buy more.: Never true    Within the past 12 months, the food you bought just didn't last and you didn't have money to get more.: Never true  Transportation Needs: No Transportation Needs (04/06/2024)   Received from [facility] - Transportation    In the past 12  months, has lack of transportation kept you from medical appointments or from getting medications?: No    Lack of Transportation (Non-Medical): No  Physical Activity: Insufficiently Active (02/01/2024)   Exercise Vital Sign    Days of Exercise per Week: 3  days    Minutes of Exercise per Session: 20 min  Stress: Stress Concern Present (02/01/2024)   [employer] of [employer] - Occupational Stress Questionnaire    Feeling of Stress: To some extent  Social Connections: Socially Integrated (02/01/2024)   Social Connection and Isolation Panel    Frequency of Communication with Friends and Family: More than three times a week    Frequency of Social Gatherings with Friends and Family: More than three times a week    Attends Religious Services: More than 4 times per year    Active Member of [employer] or Organizations: Yes    Attends Engineer, Structural: More than 4 times per year    Marital Status: Married  Catering Manager Violence: Not At Risk (06/18/2023)   Humiliation, Afraid, Rape, and Kick questionnaire    Fear of Current or Ex-Partner: No    Emotionally Abused: No    Physically Abused: No    Sexually Abused: No  Depression (PHQ2-9): Low Risk (02/19/2024)   Depression (PHQ2-9)    PHQ-2 Score: 0  Alcohol Screen: Low Risk (01/05/2024)   Alcohol Screen    Last Alcohol Screening Score (AUDIT): 0  Housing: Low Risk  (04/06/2024)   Received from [facility]   Epic    In the last 12 months, was there a time when you were not able to pay the mortgage or rent on time?: No    In the past 12 months, how many times have you moved where you were living?: 0    At any time in the past 12 months, were you homeless or living in a shelter (including now)?: No  Utilities: Not At Risk (04/06/2024)   Received from [facility]   Epic    In the past 12 months has the electric, gas, oil, or water company threatened to shut off services in your home?:  No  Health Literacy: Adequate Health Literacy (06/18/2023)   B1300 Health Literacy    Frequency of need for help with medical instructions: Never     Constitutional: Patient reports headache, for.  Denies fever, malaise, or abrupt weight changes.  HEENT: Patient reports runny nose.  Denies eye pain, eye redness, ear pain, ringing in the ears, wax buildup, nasal congestion, bloody nose, or sore throat. Respiratory: Patient reports cough.  Denies difficulty breathing, shortness of breath.   Cardiovascular: Denies chest pain, chest tightness, palpitations or swelling in the hands or feet.  Gastrointestinal: Denies abdominal pain, bloating, constipation, diarrhea or blood in the stool.  GU: Denies urgency, frequency, pain with urination, burning sensation, blood in urine, odor or discharge. Musculoskeletal: Patient reports body aches.  Denies decrease in range of motion, difficulty with gait, or joint swelling.  Skin: Denies redness, rashes, lesions or ulcercations.  Neurological: Denies dizziness, difficulty with memory, difficulty with speech or problems with balance and coordination.  Psych: Denies anxiety, depression, SI/HI.  No other specific complaints in a complete review of systems (except as listed in HPI above).      Objective:   Physical Exam  BP 110/60 (BP Location: Right Arm, Patient Position: Sitting, Cuff Size: Normal)   Pulse 82   Temp 98.9 F (37.2 C)   Ht 5' 7 (1.702 m)   Wt 224 lb (101.6 kg)   SpO2 94%   BMI 35.08 kg/m   Wt Readings from Last 3 Encounters:  03/20/24 228 lb (103.4 kg)  02/19/24 229 lb 6.4 oz (104.1 kg)  02/05/24  231 lb 12.8 oz (105.1 kg)    General: Appears her stated age, appears unwell but in NAD. Skin: Warm, dry and intact.  HEENT: Head: normal shape and size, no sinus tenderness noted; Eyes: sclera white, no icterus, conjunctiva pink, PERRLA and EOMs intact; Nose: mucosa pink and moist, septum midline; Throat/Mouth: Teeth present, mucosa  pink and moist, no exudate, lesions or ulcerations noted.  Neck: No adenopathy noted. Cardiovascular: Normal rate and rhythm. S1,S2 noted.  No murmur, rubs or gallops noted.  Pulmonary/Chest: Normal effort and positive vesicular breath sounds but diminished in the RLL. No respiratory distress. No wheezes, rales or ronchi noted.  Musculoskeletal: Gait slow and steady with use of cane. Neurological: Alert and oriented.   BMET    Component Value Date/Time   NA 137 02/19/2024 1009   NA 140 02/05/2024 1012   NA 141 11/11/2011 2040   K 4.8 02/19/2024 1009   K 3.8 11/11/2011 2040   CL 100 02/19/2024 1009   CL 105 11/11/2011 2040   CO2 27 02/19/2024 1009   CO2 28 11/11/2011 2040   GLUCOSE 142 (H) 02/19/2024 1009   GLUCOSE 119 (H) 11/11/2011 2040   BUN 26 (H) 02/19/2024 1009   BUN 21 02/05/2024 1012   BUN 15 11/11/2011 2040   CREATININE 1.40 (H) 02/19/2024 1009   CREATININE 0.82 11/11/2011 2040   CALCIUM 9.8 02/19/2024 1009   CALCIUM 9.5 11/11/2011 2040   GFRNONAA 39 (L) 02/19/2024 1009   GFRNONAA >60 11/11/2011 2040   GFRAA 85 11/23/2019 1015   GFRAA >60 11/11/2011 2040    Lipid Panel     Component Value Date/Time   CHOL 205 (H) 02/05/2024 1012   TRIG 136 02/05/2024 1012   HDL 44 02/05/2024 1012   LDLCALC 137 (H) 02/05/2024 1012    CBC    Component Value Date/Time   WBC 6.8 02/19/2024 1009   WBC 7.3 02/15/2022 0957   RBC 4.11 02/19/2024 1009   HGB 12.3 02/19/2024 1009   HGB 13.1 12/27/2023 0842   HCT 37.6 02/19/2024 1009   HCT 39.6 12/27/2023 0842   PLT 220 02/19/2024 1009   PLT 238 12/27/2023 0842   MCV 91.5 02/19/2024 1009   MCV 93 12/27/2023 0842   MCV 87 11/11/2011 2040   MCH 29.9 02/19/2024 1009   MCHC 32.7 02/19/2024 1009   RDW 12.6 02/19/2024 1009   RDW 12.7 12/27/2023 0842   RDW 13.6 11/11/2011 2040   LYMPHSABS 1.0 02/19/2024 1009   LYMPHSABS 1.0 12/27/2023 0842   LYMPHSABS 0.4 (L) 11/11/2011 2040   MONOABS 0.7 02/19/2024 1009   MONOABS 0.7  11/11/2011 2040   EOSABS 0.4 02/19/2024 1009   EOSABS 0.2 12/27/2023 0842   EOSABS 0.2 11/11/2011 2040   BASOSABS 0.1 02/19/2024 1009   BASOSABS 0.1 12/27/2023 0842   BASOSABS 0.0 11/11/2011 2040    Hgb A1C Lab Results  Component Value Date   HGBA1C 6.0 (H) 02/05/2024            Assessment & Plan:   Assessment and Plan    Influenza A with respiratory manifestations Acute influenza A confirmed by nasal swab. Symptoms include headache, rhinorrhea, fatigue, cough, and nasal congestion. No sinus pressure or significant ear pain. Lungs clear, no wheezing or signs of bronchitis or pneumonia. No flu shot this year due to esophageal issues. - Prescribed Tamiflu 75 mg twice daily for 5 days. - Recommended rest and increased fluid intake. - Advised use of Flonase  nasal spray for nasal congestion. -  Recommended Zyrtec or Allegra for scratchy throat and cough. - Advised against attending gatherings to prevent spread.  Chronic obstructive pulmonary disease (COPD) COPD with right-sided lobectomy due to abnormal lung findings. Currently using Trelegy and albuterol  as needed. No current wheezing or significant respiratory distress. - Continue Trelegy and albuterol  as needed.         Follow-up with your PCP as previously scheduled Angeline Laura, NP     [1]  Allergies Allergen Reactions   Contrast Media [Iodinated Contrast Media] Swelling    Eye swelling shut   Iodine Swelling    Angioedema of eyes   Other Swelling    Eye swelling shut IV contrast media    Ace Inhibitors Other (See Comments)    Makes me feel strange.   Hctz [Hydrochlorothiazide] Other (See Comments)    arms ache   Keflex [Cephalexin] Other (See Comments)    thrush   "

## 2024-04-07 NOTE — Telephone Encounter (Signed)
 Noted

## 2024-04-07 NOTE — Telephone Encounter (Signed)
 Pt agreeable to UC as no same day OV available.  Reason for Disposition  Wheezing is present  Answer Assessment - Initial Assessment Questions Pt reports productive cough with green sputum, headache, congestion. Denies fever. Symptoms began 04/05/24. Has not taken home covid or flu test. No same day OV, pt agreeable to UC. Has hx of partial right lung lobectomy and asthma.   1. ONSET: When did the cough begin?      04/05/24 2. SEVERITY: How bad is the cough today?      Severe 3. SPUTUM: Describe the color of your sputum (e.g., none, dry cough; clear, white, yellow, green)     Green 4. HEMOPTYSIS: Are you coughing up any blood? If Yes, ask: How much? (e.g., flecks, streaks, tablespoons, etc.)     Denies 5. DIFFICULTY BREATHING: Are you having difficulty breathing? If Yes, ask: How bad is it? (e.g., mild, moderate, severe)      No more than normal per patient 6. FEVER: Do you have a fever? If Yes, ask: What is your temperature, how was it measured, and when did it start?     Denies 7. CARDIAC HISTORY: Do you have any history of heart disease? (e.g., heart attack, congestive heart failure)      CHF 8. LUNG HISTORY: Do you have any history of lung disease?  (e.g., pulmonary embolus, asthma, emphysema)     Asthma, lobectomy  Protocols used: Cough - Acute Productive-A-AH Copied from CRM #[id number]. Topic: Clinical - Red Word Triage >> Apr 07, 2024  8:22 AM Eva FALCON wrote: Red Word that prompted transfer to Nurse Triage: Congestion, headache, some chest pressure, coughing up and blowing out green mucus, overall feels horrible. Past Medical History:  Diagnosis Date   Benign tumor of lung    Breast cancer (HCC)    Cancer (HCC)    right breast   CHF (congestive heart failure) (HCC)    Dyspnea    GERD (gastroesophageal reflux disease)    Hx of adenomatous colonic polyps    Hyperlipidemia    Hypertension    Insomnia    Left ventricular dysfunction    Osteopenia     Personal history of radiation therapy    Plantar fasciitis    PONV (postoperative nausea and vomiting)    vomiting 2 days after lung surgery   S/P pneumonectomy    right lower lobe   Sleep apnea    Urinary incontinence    Uterovaginal prolapse

## 2024-04-08 ENCOUNTER — Encounter: Payer: Self-pay | Admitting: Internal Medicine

## 2024-04-08 ENCOUNTER — Ambulatory Visit (INDEPENDENT_AMBULATORY_CARE_PROVIDER_SITE_OTHER): Admitting: Internal Medicine

## 2024-04-08 VITALS — BP 110/60 | HR 82 | Temp 98.9°F | Ht 67.0 in | Wt 224.0 lb

## 2024-04-08 DIAGNOSIS — R051 Acute cough: Secondary | ICD-10-CM | POA: Diagnosis not present

## 2024-04-08 DIAGNOSIS — J4489 Other specified chronic obstructive pulmonary disease: Secondary | ICD-10-CM | POA: Diagnosis not present

## 2024-04-08 DIAGNOSIS — J101 Influenza due to other identified influenza virus with other respiratory manifestations: Secondary | ICD-10-CM | POA: Diagnosis not present

## 2024-04-08 LAB — POC COVID19/FLU A&B COMBO
Covid Antigen, POC: NEGATIVE
Influenza A Antigen, POC: POSITIVE — AB
Influenza B Antigen, POC: NEGATIVE

## 2024-04-08 MED ORDER — OSELTAMIVIR PHOSPHATE 75 MG PO CAPS: 75.0000 mg | ORAL_CAPSULE | Freq: Two times a day (BID) | ORAL | 0 refills | Status: AC

## 2024-04-08 NOTE — Patient Instructions (Signed)
 Influenza A (H5N1) Vaccine Injection What is this medication? INFLUENZA A (H5N1) VACCINE (in floo EN zuh A [H5N1] vak SEEN) reduces the risk of the influenza A (H5N1). It does not treat influenza A (H5N1). It is still possible to get influenza A (H5N1) after receiving this vaccine, but the symptoms may be less severe or not last as long. It works by helping your immune system learn how to fight off a future infection. This medicine may be used for other purposes; ask your health care provider or pharmacist if you have questions. COMMON BRAND NAME(S): Marcelyn What should I tell my care team before I take this medication? They need to know if you have any of these conditions: Bleeding disorder Fever or infection History of fainting Nervous system conditions, such as Guillain-Barre syndrome Weakened immune system An unusual or allergic reaction to influenza A (H5N1) vaccine, other vaccines, medications, foods, dyes, or preservatives Pregnant or trying to get pregnant Breastfeeding How should I use this medication? This vaccine is injected into a muscle. It is given by your care team. This vaccine requires 2 doses to get the full benefit. Set a reminder for when your next dose is due. A copy of Vaccine Information Statements will be given before each vaccination. Be sure to read this information carefully each time. This sheet may change often. Talk to your care team to see which vaccines are right for you. Some vaccines should not be used in all age groups. Overdosage: If you think you have taken too much of this medicine contact a poison control center or emergency room at once. NOTE: This medicine is only for you. Do not share this medicine with others. What if I miss a dose? Keep appointments for follow-up doses. It is important not to miss your dose. Call your care team if you are unable to keep an appointment. What may interact with this medication? Medications that lower your chance of  fighting infection This list may not describe all possible interactions. Give your health care provider a list of all the medicines, herbs, non-prescription drugs, or dietary supplements you use. Also tell them if you smoke, drink alcohol, or use illegal drugs. Some items may interact with your medicine. What should I watch for while using this medication? Visit your care team for regular health checks. Before you receive this vaccine, talk to your care team if you have an acute illness. Vaccines can be given to people with mild acute illness, such as the common cold or diarrhea. Discuss with your care team the risks and benefits of receiving this vaccine during a moderate to severe illness. Your care team may choose to wait to give you the vaccine when you feel better. Report any side effects to your care team or to the Vaccine Adverse Event Reporting System (VAERS) website at https://vaers.lagents.no. This is only for reporting side effects; VAERS staff do not give medical advice. What side effects may I notice from receiving this medication? Side effects that you should report to your care team as soon as possible: Allergic reactions--skin rash, itching, hives, swelling of the face, lips, tongue, or throat Feeling faint or lightheaded Side effects that usually do not require medical attention (report these to your care team if they continue or are bothersome): General discomfort and fatigue Headache Muscle pain Pain, redness, or irritation at injection site This list may not describe all possible side effects. Call your doctor for medical advice about side effects. You may report side effects to  FDA at 1-800-FDA-1088. Where should I keep my medication? This vaccine is only given by your care team. It will not be stored at home. NOTE: This sheet is a summary. It may not cover all possible information. If you have questions about this medicine, talk to your doctor, pharmacist, or health care  provider.  2025 [employer] Standard (2023-04-22 00:00:00)

## 2024-04-20 ENCOUNTER — Other Ambulatory Visit: Payer: Self-pay | Admitting: Gastroenterology

## 2024-04-20 DIAGNOSIS — K224 Dyskinesia of esophagus: Secondary | ICD-10-CM

## 2024-04-23 ENCOUNTER — Encounter: Payer: Self-pay | Admitting: Internal Medicine

## 2024-04-24 ENCOUNTER — Ambulatory Visit
Admission: RE | Admit: 2024-04-24 | Discharge: 2024-04-24 | Disposition: A | Discharge status: Home / Self Care | Source: Ambulatory Visit | Attending: Gastroenterology | Admitting: Gastroenterology

## 2024-04-24 DIAGNOSIS — K224 Dyskinesia of esophagus: Secondary | ICD-10-CM | POA: Diagnosis present

## 2024-05-07 ENCOUNTER — Other Ambulatory Visit: Payer: Self-pay | Admitting: Pulmonary Disease

## 2024-05-07 DIAGNOSIS — Z853 Personal history of malignant neoplasm of breast: Secondary | ICD-10-CM

## 2024-05-07 DIAGNOSIS — Z902 Acquired absence of lung [part of]: Secondary | ICD-10-CM

## 2024-05-07 DIAGNOSIS — J4489 Other specified chronic obstructive pulmonary disease: Secondary | ICD-10-CM

## 2024-05-14 ENCOUNTER — Other Ambulatory Visit: Payer: Self-pay | Admitting: Internal Medicine

## 2024-06-01 ENCOUNTER — Ambulatory Visit

## 2024-06-08 ENCOUNTER — Ambulatory Visit: Admitting: Nurse Practitioner

## 2024-06-23 ENCOUNTER — Ambulatory Visit

## 2024-08-18 ENCOUNTER — Inpatient Hospital Stay

## 2024-08-18 ENCOUNTER — Inpatient Hospital Stay: Admitting: Internal Medicine
